# Patient Record
Sex: Female | Born: 1940 | Race: White | Hispanic: No | Smoking: Former smoker
Health system: Southern US, Community
[De-identification: ages and names within clinical notes are randomized; demographics above are authoritative.]

## PROBLEM LIST (undated history)

## (undated) DIAGNOSIS — M1990 Unspecified osteoarthritis, unspecified site: Secondary | ICD-10-CM

## (undated) DIAGNOSIS — I2510 Atherosclerotic heart disease of native coronary artery without angina pectoris: Secondary | ICD-10-CM

## (undated) DIAGNOSIS — K279 Peptic ulcer, site unspecified, unspecified as acute or chronic, without hemorrhage or perforation: Secondary | ICD-10-CM

## (undated) DIAGNOSIS — C801 Malignant (primary) neoplasm, unspecified: Secondary | ICD-10-CM

## (undated) DIAGNOSIS — E785 Hyperlipidemia, unspecified: Secondary | ICD-10-CM

## (undated) DIAGNOSIS — I252 Old myocardial infarction: Secondary | ICD-10-CM

## (undated) DIAGNOSIS — D649 Anemia, unspecified: Secondary | ICD-10-CM

## (undated) DIAGNOSIS — M810 Age-related osteoporosis without current pathological fracture: Secondary | ICD-10-CM

## (undated) DIAGNOSIS — N189 Chronic kidney disease, unspecified: Secondary | ICD-10-CM

## (undated) DIAGNOSIS — I4891 Unspecified atrial fibrillation: Secondary | ICD-10-CM

## (undated) DIAGNOSIS — I5042 Chronic combined systolic (congestive) and diastolic (congestive) heart failure: Secondary | ICD-10-CM

## (undated) DIAGNOSIS — J189 Pneumonia, unspecified organism: Secondary | ICD-10-CM

## (undated) DIAGNOSIS — J90 Pleural effusion, not elsewhere classified: Secondary | ICD-10-CM

## (undated) DIAGNOSIS — Z950 Presence of cardiac pacemaker: Secondary | ICD-10-CM

## (undated) DIAGNOSIS — I10 Essential (primary) hypertension: Secondary | ICD-10-CM

## (undated) HISTORY — DX: Age-related osteoporosis without current pathological fracture: M81.0

## (undated) HISTORY — PX: CATARACT EXTRACTION: SUR2

## (undated) HISTORY — DX: Pneumonia, unspecified organism: J18.9

## (undated) HISTORY — DX: Peptic ulcer, site unspecified, unspecified as acute or chronic, without hemorrhage or perforation: K27.9

## (undated) HISTORY — DX: Presence of cardiac pacemaker: Z95.0

## (undated) HISTORY — DX: Essential (primary) hypertension: I10

## (undated) HISTORY — DX: Old myocardial infarction: I25.2

## (undated) HISTORY — DX: Chronic kidney disease, unspecified: N18.9

## (undated) HISTORY — DX: Anemia, unspecified: D64.9

## (undated) HISTORY — DX: Pleural effusion, not elsewhere classified: J90

## (undated) HISTORY — DX: Unspecified atrial fibrillation: I48.91

## (undated) HISTORY — DX: Atherosclerotic heart disease of native coronary artery without angina pectoris: I25.10

## (undated) HISTORY — PX: COLONOSCOPY: SHX174

## (undated) HISTORY — DX: Hyperlipidemia, unspecified: E78.5

## (undated) HISTORY — DX: Chronic combined systolic (congestive) and diastolic (congestive) heart failure: I50.42

---

## 2007-06-28 HISTORY — PX: CORONARY ANGIOPLASTY: SHX604

## 2007-07-13 HISTORY — PX: PACEMAKER INSERTION: SHX728

## 2007-07-13 HISTORY — PX: OTHER SURGICAL HISTORY: SHX169

## 2010-02-23 ENCOUNTER — Other Ambulatory Visit: Admission: RE | Admit: 2010-02-23 | Discharge: 2010-02-23 | Payer: Self-pay | Admitting: Family Medicine

## 2010-02-23 ENCOUNTER — Encounter: Payer: Self-pay | Admitting: Gastroenterology

## 2010-02-23 ENCOUNTER — Ambulatory Visit: Payer: Self-pay | Admitting: Family Medicine

## 2010-02-23 DIAGNOSIS — E785 Hyperlipidemia, unspecified: Secondary | ICD-10-CM | POA: Insufficient documentation

## 2010-02-23 DIAGNOSIS — M810 Age-related osteoporosis without current pathological fracture: Secondary | ICD-10-CM

## 2010-02-23 DIAGNOSIS — I2510 Atherosclerotic heart disease of native coronary artery without angina pectoris: Secondary | ICD-10-CM | POA: Insufficient documentation

## 2010-02-23 DIAGNOSIS — I129 Hypertensive chronic kidney disease with stage 1 through stage 4 chronic kidney disease, or unspecified chronic kidney disease: Secondary | ICD-10-CM

## 2010-02-23 DIAGNOSIS — E1122 Type 2 diabetes mellitus with diabetic chronic kidney disease: Secondary | ICD-10-CM | POA: Insufficient documentation

## 2010-02-23 DIAGNOSIS — I10 Essential (primary) hypertension: Secondary | ICD-10-CM

## 2010-02-23 DIAGNOSIS — Z950 Presence of cardiac pacemaker: Secondary | ICD-10-CM

## 2010-02-23 DIAGNOSIS — N390 Urinary tract infection, site not specified: Secondary | ICD-10-CM

## 2010-02-23 DIAGNOSIS — N184 Chronic kidney disease, stage 4 (severe): Secondary | ICD-10-CM

## 2010-02-24 ENCOUNTER — Telehealth: Payer: Self-pay | Admitting: Family Medicine

## 2010-02-24 LAB — CONVERTED CEMR LAB
ALT: 21 units/L (ref 0–35)
Albumin: 4.1 g/dL (ref 3.5–5.2)
Alkaline Phosphatase: 56 units/L (ref 39–117)
Basophils Relative: 0.4 % (ref 0.0–3.0)
Bilirubin, Direct: 0.2 mg/dL (ref 0.0–0.3)
CO2: 28 meq/L (ref 19–32)
Chloride: 96 meq/L (ref 96–112)
Creatinine, Ser: 1 mg/dL (ref 0.4–1.2)
Eosinophils Relative: 2 % (ref 0.0–5.0)
Hemoglobin: 13.1 g/dL (ref 12.0–15.0)
LDL Cholesterol: 95 mg/dL (ref 0–99)
MCHC: 34.3 g/dL (ref 30.0–36.0)
MCV: 88.6 fL (ref 78.0–100.0)
Monocytes Absolute: 0.6 10*3/uL (ref 0.1–1.0)
Neutro Abs: 5.4 10*3/uL (ref 1.4–7.7)
Neutrophils Relative %: 70.5 % (ref 43.0–77.0)
Potassium: 4.3 meq/L (ref 3.5–5.1)
RBC: 4.31 M/uL (ref 3.87–5.11)
Sodium: 134 meq/L — ABNORMAL LOW (ref 135–145)
Total CHOL/HDL Ratio: 4
Total Protein: 7.5 g/dL (ref 6.0–8.3)
Triglycerides: 185 mg/dL — ABNORMAL HIGH (ref 0.0–149.0)
WBC: 7.6 10*3/uL (ref 4.5–10.5)

## 2010-03-05 ENCOUNTER — Telehealth (INDEPENDENT_AMBULATORY_CARE_PROVIDER_SITE_OTHER): Payer: Self-pay | Admitting: *Deleted

## 2010-03-05 ENCOUNTER — Ambulatory Visit: Payer: Self-pay | Admitting: Family Medicine

## 2010-03-05 LAB — CONVERTED CEMR LAB: POC INR: 1.8

## 2010-03-10 ENCOUNTER — Encounter: Admission: RE | Admit: 2010-03-10 | Discharge: 2010-03-10 | Payer: Self-pay | Admitting: Family Medicine

## 2010-03-19 ENCOUNTER — Ambulatory Visit: Payer: Self-pay | Admitting: Family Medicine

## 2010-03-19 LAB — CONVERTED CEMR LAB: POC INR: 3.2

## 2010-03-24 ENCOUNTER — Ambulatory Visit: Payer: Self-pay | Admitting: Internal Medicine

## 2010-03-24 DIAGNOSIS — I4891 Unspecified atrial fibrillation: Secondary | ICD-10-CM

## 2010-03-26 ENCOUNTER — Telehealth (INDEPENDENT_AMBULATORY_CARE_PROVIDER_SITE_OTHER): Payer: Self-pay | Admitting: *Deleted

## 2010-04-01 ENCOUNTER — Encounter: Payer: Self-pay | Admitting: Family Medicine

## 2010-04-09 ENCOUNTER — Encounter: Payer: Self-pay | Admitting: Internal Medicine

## 2010-04-09 ENCOUNTER — Encounter: Payer: Self-pay | Admitting: Family Medicine

## 2010-04-09 ENCOUNTER — Telehealth: Payer: Self-pay | Admitting: Internal Medicine

## 2010-04-09 ENCOUNTER — Telehealth: Payer: Self-pay | Admitting: Family Medicine

## 2010-04-15 ENCOUNTER — Ambulatory Visit: Payer: Self-pay | Admitting: Gastroenterology

## 2010-04-15 ENCOUNTER — Telehealth: Payer: Self-pay | Admitting: Family Medicine

## 2010-04-15 ENCOUNTER — Encounter: Payer: Self-pay | Admitting: Family Medicine

## 2010-04-15 ENCOUNTER — Encounter: Payer: Self-pay | Admitting: Internal Medicine

## 2010-04-20 ENCOUNTER — Ambulatory Visit: Payer: Self-pay | Admitting: Family Medicine

## 2010-04-23 ENCOUNTER — Telehealth: Payer: Self-pay | Admitting: Gastroenterology

## 2010-05-17 ENCOUNTER — Telehealth (INDEPENDENT_AMBULATORY_CARE_PROVIDER_SITE_OTHER): Payer: Self-pay | Admitting: *Deleted

## 2010-05-19 ENCOUNTER — Ambulatory Visit: Payer: Self-pay | Admitting: Family Medicine

## 2010-05-19 ENCOUNTER — Telehealth: Payer: Self-pay | Admitting: Gastroenterology

## 2010-05-19 LAB — CONVERTED CEMR LAB: INR: 4.1

## 2010-05-25 ENCOUNTER — Telehealth: Payer: Self-pay | Admitting: Gastroenterology

## 2010-05-26 ENCOUNTER — Ambulatory Visit: Payer: Self-pay | Admitting: Gastroenterology

## 2010-05-28 ENCOUNTER — Encounter: Payer: Self-pay | Admitting: Gastroenterology

## 2010-06-03 ENCOUNTER — Ambulatory Visit: Payer: Self-pay | Admitting: Family Medicine

## 2010-06-03 ENCOUNTER — Telehealth: Payer: Self-pay | Admitting: Family Medicine

## 2010-06-04 ENCOUNTER — Telehealth (INDEPENDENT_AMBULATORY_CARE_PROVIDER_SITE_OTHER): Payer: Self-pay | Admitting: *Deleted

## 2010-06-04 LAB — CONVERTED CEMR LAB
ALT: 22 units/L (ref 0–35)
Albumin: 3.9 g/dL (ref 3.5–5.2)
BUN: 24 mg/dL — ABNORMAL HIGH (ref 6–23)
CO2: 28 meq/L (ref 19–32)
Chloride: 95 meq/L — ABNORMAL LOW (ref 96–112)
Cholesterol: 171 mg/dL (ref 0–200)
Creatinine, Ser: 1 mg/dL (ref 0.4–1.2)
Glucose, Bld: 58 mg/dL — ABNORMAL LOW (ref 70–99)
Hgb A1c MFr Bld: 7.3 % — ABNORMAL HIGH (ref 4.6–6.5)
INR: 1.8 — ABNORMAL HIGH (ref 0.8–1.0)
Microalb, Ur: 6.1 mg/dL — ABNORMAL HIGH (ref 0.0–1.9)
Total Protein: 7.7 g/dL (ref 6.0–8.3)
Triglycerides: 203 mg/dL — ABNORMAL HIGH (ref 0.0–149.0)

## 2010-06-14 ENCOUNTER — Telehealth (INDEPENDENT_AMBULATORY_CARE_PROVIDER_SITE_OTHER): Payer: Self-pay | Admitting: *Deleted

## 2010-06-25 ENCOUNTER — Encounter: Payer: Self-pay | Admitting: Family Medicine

## 2010-07-14 ENCOUNTER — Encounter: Payer: Self-pay | Admitting: Family Medicine

## 2010-07-16 ENCOUNTER — Encounter (INDEPENDENT_AMBULATORY_CARE_PROVIDER_SITE_OTHER): Payer: Self-pay | Admitting: *Deleted

## 2010-07-27 NOTE — Progress Notes (Signed)
Summary: Pt took coumadin today  Phone Note Outgoing Call Call back at [facility] Phone [phone]   Call placed by: Aron Baba CMA Deborra Medina),  February 24, 2010 9:03 AM Call placed to: Patient Details for Reason: Pt took coumadin today Summary of Call: PT/ INR high----  hold coumadin Wednesday night and take 2.5 mg on Wed and Fridays and 5 mg all other days--- recheck next week  Spk with pt and notified her of the above, Pt already took her coumadin this morning with her breakfast. Is there a new direction/instruction for this pt, and she also wanted to know if she could take the fenofibrate at night with lipitor or during the day..Please Advise.         Aron Baba CMA Deborra Medina)  February 24, 2010 9:06 AM   Follow-up for Phone Call        she needs to take coumadin at night ok to take fenofibrate at night or in day either way hold coumadin tomorrow Follow-up by: [name] DO,  February 24, 2010 12:28 PM  Additional Follow-up for Phone Call Additional follow up Details #1::        Pt notified of the above, She voiced understanding. Additional Follow-up by: Aron Baba CMA Deborra Medina),  February 24, 2010 2:08 PM    New/Updated Medications: FENOFIBRATE 160 MG TABS (FENOFIBRATE) 1 by mouth once daily Prescriptions: FENOFIBRATE 160 MG TABS (FENOFIBRATE) 1 by mouth once daily  #30 x 2   Entered by:   Aron Baba CMA ([place])   Authorized by:   [name] DO   Signed by:   Aron Baba CMA (AAMA) on 02/24/2010   Method used:   Faxed to ...       [facility].* (retail)       [phone]       [place], [place]  [address]       Ph: [phone]       Fax: [phone]   RxID:   [id number]

## 2010-07-27 NOTE — Assessment & Plan Note (Signed)
Summary: 3 MONTH FOLLOWUP///SPH/ rescd cbs   Vital Signs:  Patient profile:   70 year old female Weight:      175.8 pounds Temp:     97.4 degrees F oral BP sitting:   120 / 70  (left arm) Cuff size:   large  Vitals Entered By: Aron Baba CMA Deborra Medina) (June 03, 2010 9:31 AM) CC: 3 mo f/u-- no problems or concerns.   History of Present Illness:  Type 1 diabetes mellitus follow-up      This is a 70 year old woman who presents with Type 2 diabetes mellitus follow-up.  The patient denies polyuria, polydipsia, blurred vision, self managed hypoglycemia, hypoglycemia requiring help, weight loss, weight gain, and numbness of extremities.  The patient denies the following symptoms: neuropathic pain, chest pain, vomiting, orthostatic symptoms, poor wound healing, intermittent claudication, vision loss, and foot ulcer.  Since the last visit the patient reports good dietary compliance, compliance with medications, exercising regularly, and monitoring blood glucose.  The patient has been measuring capillary blood glucose before breakfast.  Since the last visit, the patient reports having had eye care by an ophthalmologist and foot care by a podiatrist.    Hyperlipidemia follow-up      The patient also presents for Hyperlipidemia follow-up.  The patient denies muscle aches, GI upset, abdominal pain, flushing, itching, constipation, diarrhea, and fatigue.  The patient denies the following symptoms: chest pain/pressure, exercise intolerance, dypsnea, palpitations, syncope, and pedal edema.  Compliance with medications (by patient report) has been near 100%.  Dietary compliance has been good.  The patient reports exercising 3-4X per week.  Adjunctive measures currently used by the patient include ASA.    Hypertension follow-up      The patient also presents for Hypertension follow-up.  The patient denies lightheadedness, urinary frequency, headaches, edema, impotence, rash, and fatigue.  The patient  denies the following associated symptoms: chest pain, chest pressure, exercise intolerance, dyspnea, palpitations, syncope, leg edema, and pedal edema.  Compliance with medications (by patient report) has been near 100%.  The patient reports that dietary compliance has been good.  The patient reports exercising 3-4X per week.  Adjunctive measures currently used by the patient include salt restriction.    Current Medications (verified): 1)  Levemir 100 Unit/ml Soln (Insulin Detemir) .... Everyday At Bedtime 2)  Fish Oil 1000 Mg Caps (Omega-3 Fatty Acids) .... By Mouth Qd 3)  Calcium Carbonate 600 Mg Tabs (Calcium Carbonate) .Marland Kitchen.. 1 By Mouth Qd 4)  Vitamin D 400 Unit Caps (Cholecalciferol) .... Take 1 Tablet By Mouth Once A Day 5)  Sotalol Hcl 80 Mg Tabs (Sotalol Hcl) .Marland Kitchen.. 1 By Mouth Bid 6)  Lipitor 20 Mg Tabs (Atorvastatin Calcium) .Marland Kitchen.. 1 By Mouth At Bedtime 7)  Diovan Hct 320-25 Mg Tabs (Valsartan-Hydrochlorothiazide) .Marland Kitchen.. 1 By Mouth Qd 8)  Relion Pen Needles 31g X 8 Mm Misc (Insulin Pen Needle) .Marland Kitchen.. 1 As Needed 9)  Slow-Mag 71.5-119 Mg Tbec (Magnesium Cl-Calcium Carbonate) .Marland Kitchen.. 1 By Mouth Qd 10)  Glyburide-Metformin 5-500 Mg Tabs (Glyburide-Metformin) .... 1/2 Tablet By Mouth Qd 11)  Aspirin 81 Mg Tbec (Aspirin) .Marland Kitchen.. 1 By Mouth Qd 12)  Tekturna 300 Mg Tabs (Aliskiren Fumarate) .... Take One Tablet Daily 13)  Warfarin Sodium 5 Mg Tabs (Warfarin Sodium) .... Uad 14)  Amlodipine Besylate 10 Mg Tabs (Amlodipine Besylate) .Marland Kitchen.. 1 By Mouth Once Daily  Allergies (verified): No Known Drug Allergies  Past History:  Past Medical History: Last updated: 04/15/2010 Diabetes mellitus, type  II Hyperlipidemia Hypertension recurrent uti Coronary artery disease pacemaker --medtronic--07/13/2007 Atrial Fibrillation  Past Surgical History: Last updated: 02/23/2010 Stent Implant 07/13/2007 Pacemaker 07/13/2007 Cataract Surgery 01/12/09 and 01/26/09 both eyes  Family History: Last updated:  04/15/2010 Family History of Arthritis Family History Hypertension Family History of Breast Cancer: Mat Aunt, Sister Family History of Heart Disease: Father, deceased MI  Social History: Last updated: 04/15/2010 Retired--[employer] Former Smoker Alcohol use-no Drug use-no Regular exercise-no  Risk Factors: Alcohol Use: 0 (02/23/2010) Exercise: no (02/23/2010)  Risk Factors: Smoking Status: quit (02/23/2010)  Family History: Reviewed history from 04/15/2010 and no changes required. Family History of Arthritis Family History Hypertension Family History of Breast Cancer: Mat Aunt, Sister Family History of Heart Disease: Father, deceased MI  Social History: Reviewed history from 04/15/2010 and no changes required. Retired--[employer] Former Smoker Alcohol use-no Drug use-no Regular exercise-no  Review of Systems      See HPI  Physical Exam  General:  Well-developed,well-nourished,in no acute distress; alert,appropriate and cooperative throughout examination Nose:  no external deformity.   Lungs:  Normal respiratory effort, chest expands symmetrically. Lungs are clear to auscultation, no crackles or wheezes. Heart:  normal rate and no murmur.   Extremities:  No clubbing, cyanosis, edema, or deformity noted   Psych:  Oriented X3 and normally interactive.    Diabetes Management Exam:    Foot Exam (with socks and/or shoes not present):       Sensory-Pinprick/Light touch:          Left medial foot (L-4): normal          Left dorsal foot (L-5): normal          Left lateral foot (S-1): normal          Right medial foot (L-4): normal          Right dorsal foot (L-5): normal          Right lateral foot (S-1): normal       Sensory-Monofilament:          Left foot: normal          Right foot: normal       Inspection:          Left foot: normal          Right foot: normal       Nails:          Left foot: normal          Right foot:  normal    Foot Exam by Podiatrist:       Date: 04/01/2010       Results: early diabetic findings       Done by: Dr Serena Colonel Exam:       Eye Exam done elsewhere          Date: 03/11/2009          Results: normal          Done by: Marijo File   Impression & Recommendations:  Problem # 1:  ATRIAL FIBRILLATION (ICD-427.31)  Her updated medication list for this problem includes:    Sotalol Hcl 80 Mg Tabs (Sotalol hcl) .Marland Kitchen... 1 by mouth bid    Aspirin 81 Mg Tbec (Aspirin) .Marland Kitchen... 1 by mouth qd    Warfarin Sodium 5 Mg Tabs (Warfarin sodium) ..... Uad    Amlodipine Besylate 10 Mg Tabs (Amlodipine besylate) .Marland Kitchen... 1 by mouth once daily  Orders: Venipuncture (  36415) TLB-Lipid Panel (80061-LIPID) TLB-BMP (Basic Metabolic Panel-BMET) (99991111) TLB-Hepatic/Liver Function Pnl (80076-HEPATIC) TLB-A1C / Hgb A1C (Glycohemoglobin) (83036-A1C) TLB-Microalbumin/Creat Ratio, Urine (82043-MALB) TLB-PT (Protime) (85610-PTP) Specimen Handling (99000)  Reviewed the following: PT: 43.4 (02/23/2010)   INR: 4.1 (05/19/2010) Next Protime: 4 weeks (dated on 04/20/2010)  Problem # 2:  CORONARY ARTERY DISEASE (ICD-414.00)  Her updated medication list for this problem includes:    Sotalol Hcl 80 Mg Tabs (Sotalol hcl) .Marland Kitchen... 1 by mouth bid    Diovan Hct 320-25 Mg Tabs (Valsartan-hydrochlorothiazide) .Marland Kitchen... 1 by mouth qd    Aspirin 81 Mg Tbec (Aspirin) .Marland Kitchen... 1 by mouth qd    Tekturna 300 Mg Tabs (Aliskiren fumarate) .Marland Kitchen... Take one tablet daily    Amlodipine Besylate 10 Mg Tabs (Amlodipine besylate) .Marland Kitchen... 1 by mouth once daily  Orders: Venipuncture [id number]) TLB-Lipid Panel (80061-LIPID) TLB-BMP (Basic Metabolic Panel-BMET) (99991111) TLB-Hepatic/Liver Function Pnl (80076-HEPATIC) TLB-A1C / Hgb A1C (Glycohemoglobin) (83036-A1C) TLB-Microalbumin/Creat Ratio, Urine (82043-MALB)  Her updated medication list for this problem includes:    Sotalol Hcl 80 Mg Tabs (Sotalol hcl) .Marland Kitchen... 1 by mouth bid     Diovan Hct 320-25 Mg Tabs (Valsartan-hydrochlorothiazide) .Marland Kitchen... 1 by mouth qd    Aspirin 81 Mg Tbec (Aspirin) .Marland Kitchen... 1 by mouth qd    Tekturna 300 Mg Tabs (Aliskiren fumarate) .Marland Kitchen... Take one tablet daily    Amlodipine Besylate 10 Mg Tabs (Amlodipine besylate) .Marland Kitchen... 1 by mouth once daily  Labs Reviewed: Chol: 177 (02/23/2010)   HDL: 44.80 (02/23/2010)   LDL: 95 (02/23/2010)   TG: 185.0 (02/23/2010)  Problem # 3:  HYPERLIPIDEMIA (ICD-272.4)  Her updated medication list for this problem includes:    Lipitor 20 Mg Tabs (Atorvastatin calcium) .Marland Kitchen... 1 by mouth at bedtime  Orders: Venipuncture [id number]) TLB-Lipid Panel (80061-LIPID) TLB-BMP (Basic Metabolic Panel-BMET) (99991111) TLB-Hepatic/Liver Function Pnl (80076-HEPATIC) TLB-A1C / Hgb A1C (Glycohemoglobin) (83036-A1C) TLB-Microalbumin/Creat Ratio, Urine (82043-MALB)  Labs Reviewed: SGOT: 24 (02/23/2010)   SGPT: 21 (02/23/2010)   HDL:44.80 (02/23/2010)  LDL:95 (02/23/2010)  Chol:177 (02/23/2010)  Trig:185.0 (02/23/2010)  Problem # 4:  CARDIAC PACEMAKER IN SITU (ICD-V45.01)  Orders: Venipuncture [id number]) TLB-Lipid Panel (80061-LIPID) TLB-BMP (Basic Metabolic Panel-BMET) (99991111) TLB-Hepatic/Liver Function Pnl (80076-HEPATIC) TLB-A1C / Hgb A1C (Glycohemoglobin) (83036-A1C) TLB-Microalbumin/Creat Ratio, Urine (82043-MALB)  Problem # 5:  DIABETES MELLITUS, TYPE II (ICD-250.00)  Her updated medication list for this problem includes:    Levemir 100 Unit/ml Soln (Insulin detemir) ..... Everyday at bedtime    Diovan Hct 320-25 Mg Tabs (Valsartan-hydrochlorothiazide) .Marland Kitchen... 1 by mouth qd    Glyburide-metformin 5-500 Mg Tabs (Glyburide-metformin) .Marland Kitchen... 1/2 tablet by mouth qd    Aspirin 81 Mg Tbec (Aspirin) .Marland Kitchen... 1 by mouth qd  Orders: Venipuncture [id number]) TLB-Lipid Panel (80061-LIPID) TLB-BMP (Basic Metabolic Panel-BMET) (99991111) TLB-Hepatic/Liver Function Pnl (80076-HEPATIC) TLB-A1C / Hgb A1C (Glycohemoglobin)  (83036-A1C) TLB-Microalbumin/Creat Ratio, Urine (82043-MALB) Ophthalmology Referral (Ophthalmology)  Labs Reviewed: Creat: 1.0 (02/23/2010)     Last Eye Exam: normal (03/11/2009) Reviewed HgBA1c results: 6.9 (02/23/2010)  Complete Medication List: 1)  Levemir 100 Unit/ml Soln (Insulin detemir) .... Everyday at bedtime 2)  Fish Oil 1000 Mg Caps (Omega-3 fatty acids) .... By mouth qd 3)  Calcium Carbonate 600 Mg Tabs (Calcium carbonate) .Marland Kitchen.. 1 by mouth qd 4)  Vitamin D 400 Unit Caps (Cholecalciferol) .... Take 1 tablet by mouth once a day 5)  Sotalol Hcl 80 Mg Tabs (Sotalol hcl) .Marland Kitchen.. 1 by mouth bid 6)  Lipitor 20 Mg Tabs (Atorvastatin calcium) .Marland Kitchen.. 1 by mouth at bedtime 7)  Diovan Hct  320-25 Mg Tabs (Valsartan-hydrochlorothiazide) .Marland Kitchen.. 1 by mouth qd 8)  Relion Pen Needles 31g X 8 Mm Misc (Insulin pen needle) .Marland Kitchen.. 1 as needed 9)  Slow-mag 71.5-119 Mg Tbec (Magnesium cl-calcium carbonate) .Marland Kitchen.. 1 by mouth qd 10)  Glyburide-metformin 5-500 Mg Tabs (Glyburide-metformin) .... 1/2 tablet by mouth qd 11)  Aspirin 81 Mg Tbec (Aspirin) .Marland Kitchen.. 1 by mouth qd 12)  Tekturna 300 Mg Tabs (Aliskiren fumarate) .... Take one tablet daily 13)  Warfarin Sodium 5 Mg Tabs (Warfarin sodium) .... Uad 14)  Amlodipine Besylate 10 Mg Tabs (Amlodipine besylate) .Marland Kitchen.. 1 by mouth once daily  Patient Instructions: 1)  Please schedule a follow-up appointment in 6 months .    Orders Added: 1)  Venipuncture [id number] 2)  TLB-Lipid Panel [80061-LIPID] 3)  TLB-BMP (Basic Metabolic Panel-BMET) [id number] 4)  TLB-Hepatic/Liver Function Pnl [80076-HEPATIC] 5)  TLB-A1C / Hgb A1C (Glycohemoglobin) [83036-A1C] 6)  TLB-Microalbumin/Creat Ratio, Urine [82043-MALB] 7)  TLB-PT (Protime) [85610-PTP] 8)  Ophthalmology Referral [Ophthalmology] 9)  Specimen Handling [99000] 10)  Est. Patient Level III [id number]    Flu Vaccine Result Date:  04/07/2010 Flu Vaccine Result:  given Flu Vaccine Next Due:  1 yr Pneumovax Result  Date:  04/07/2010 Pneumovax Result:  given

## 2010-07-27 NOTE — Progress Notes (Signed)
Summary: Refill Request  Phone Note Refill Request Call back at [phone] Message from:  Pharmacy on April 15, 2010 9:52 AM  Refills Requested: Medication #1:  WARFARIN SODIUM 5 MG TABS UAD   Dosage confirmed as above?Dosage Confirmed   Supply Requested: 1 month   Last Refilled: 03/18/2010 [facility] on [address]  Next Appointment Scheduled: 12.2.11 Initial call taken by: Elna Breslow,  April 15, 2010 9:52 AM  Follow-up for Phone Call        make sure pt has PT scheduled --due this week Follow-up by: [name] DO,  April 15, 2010 3:54 PM  Additional Follow-up for Phone Call Additional follow up Details #1::        adv pt she will need her pt checked, she stated no one notified her, adv f/u is due now, says she will call daughter and and see what day she can bring her and she will call back to schedule. Rx faxed.... [place] Deborra Medina)  April 15, 2010 4:39 PM     Prescriptions: WARFARIN SODIUM 5 MG TABS (WARFARIN SODIUM) UAD  #30 x 0   Entered by:   Aron Baba CMA ([place])   Authorized by:   [name] DO   Signed by:   Aron Baba CMA ([place]) on 04/15/2010   Method used:   Electronically to        [facility].* (retail)       [phone]       [place], [place]  [address]       Ph: [phone]       Fax: [phone]   RxID:   [id number]

## 2010-07-27 NOTE — Progress Notes (Signed)
Summary: called back with ophthamologist name and facilty  Phone Note Call from Patient Call back at Home Phone [phone]   Caller: Patient Summary of Call: patient called back (her daughter was with her) ----patient confirmed with daughter who said that Ophthamologist' name is Dr Joya San and this doctor is located at [facility] in [place] will be at home number [phone] for rest of the day Initial call taken by: Berneta Sages,  June 03, 2010 12:25 PM  Follow-up for Phone Call        Appt Scheduled Follow-up by: Phylliss Bob [facility],  June 03, 2010 1:57 PM

## 2010-07-27 NOTE — Assessment & Plan Note (Signed)
Summary: pt/cbs  Nurse Visit   Vital Signs:  Patient profile:   70 year old female Height:      62.5 inches Weight:      177 pounds Pulse rate:   68 / minute BP sitting:   118 / 62  (left arm)  Vitals Entered By: Rolla Flatten CMA (May 19, 2010 9:54 AM)  CC: PT/INR   Current Medications (verified): 1)  Levemir 100 Unit/ml Soln (Insulin Detemir) .... Everyday At Bedtime 2)  Fish Oil 1000 Mg Caps (Omega-3 Fatty Acids) .... By Mouth Qd 3)  Calcium Carbonate 600 Mg Tabs (Calcium Carbonate) .Marland Kitchen.. 1 By Mouth Qd 4)  Vitamin D 400 Unit Caps (Cholecalciferol) .... Take 1 Tablet By Mouth Once A Day 5)  Sotalol Hcl 80 Mg Tabs (Sotalol Hcl) .Marland Kitchen.. 1 By Mouth Bid 6)  Lipitor 20 Mg Tabs (Atorvastatin Calcium) .Marland Kitchen.. 1 By Mouth At Bedtime 7)  Diovan Hct 320-25 Mg Tabs (Valsartan-Hydrochlorothiazide) .Marland Kitchen.. 1 By Mouth Qd 8)  Relion Pen Needles 31g X 8 Mm Misc (Insulin Pen Needle) .Marland Kitchen.. 1 As Needed 9)  Slow-Mag 71.5-119 Mg Tbec (Magnesium Cl-Calcium Carbonate) .Marland Kitchen.. 1 By Mouth Qd 10)  Glyburide-Metformin 5-500 Mg Tabs (Glyburide-Metformin) .... 1/2 Tablet By Mouth Qd 11)  Aspirin 81 Mg Tbec (Aspirin) .Marland Kitchen.. 1 By Mouth Qd 12)  Tekturna 300 Mg Tabs (Aliskiren Fumarate) .... Take One Tablet Daily 13)  Warfarin Sodium 5 Mg Tabs (Warfarin Sodium) .... Uad 14)  Amlodipine Besylate 10 Mg Tabs (Amlodipine Besylate) .Marland Kitchen.. 1 By Mouth Once Daily  Allergies (verified): No Known Drug Allergies Laboratory Results   Blood Tests    Date/Time Reported: May 19, 2010 9:55 AM   INR: 4.1   (Normal Range: 0.88-1.12   Therap INR: 2.0-3.5)    Orders Added: 1)  Est. Patient Level I [id number] 2)  Protime [id number]    ANTICOAGULATION RECORD PREVIOUS REGIMEN & LAB RESULTS   Previous INR:  3.0 on  04/20/2010 Previous Coumadin Dose(mg):  (5 mg) 1 tab qd except  for (2.5mg ) 1/2 tab W,F on  04/20/2010 Previous Regimen:  Same on  04/20/2010  NEW REGIMEN & LAB RESULTS Current INR: 4.1 Current Coumadin  Dose(mg): 1 TAB DAILY EXCEPT 1/2 TAB M.F Regimen: hold   Provider: LOWNE  Anticoagulation Visit Questionnaire Coumadin dose missed/changed:  No Abnormal Bleeding Symptoms:  No  Any diet changes including alcohol intake, vegetables or greens since the last visit:  No Any illnesses or hospitalizations since the last visit:  No Any signs of clotting since the last visit (including chest discomfort, dizziness, shortness of breath, arm tingling, slurred speech, swelling or redness in leg):  No  MEDICATIONS LEVEMIR 100 UNIT/ML SOLN (INSULIN DETEMIR) EVERYDAY AT BEDTIME FISH OIL 1000 MG CAPS (OMEGA-3 FATTY ACIDS) by mouth QD CALCIUM CARBONATE 600 MG TABS (CALCIUM CARBONATE) 1 by mouth QD VITAMIN D 400 UNIT CAPS (CHOLECALCIFEROL) Take 1 tablet by mouth once a day SOTALOL HCL 80 MG TABS (SOTALOL HCL) 1 by mouth BID LIPITOR 20 MG TABS (ATORVASTATIN CALCIUM) 1 by mouth at bedtime DIOVAN HCT 320-25 MG TABS (VALSARTAN-HYDROCHLOROTHIAZIDE) 1 by mouth qd RELION PEN NEEDLES 31G X 8 MM MISC (INSULIN PEN NEEDLE) 1 as needed SLOW-MAG 71.5-119 MG TBEC (MAGNESIUM CL-CALCIUM CARBONATE) 1 by mouth qd GLYBURIDE-METFORMIN 5-500 MG TABS (GLYBURIDE-METFORMIN) 1/2 tablet by mouth qd ASPIRIN 81 MG TBEC (ASPIRIN) 1 by mouth qd TEKTURNA 300 MG TABS (ALISKIREN FUMARATE) take one tablet daily WARFARIN SODIUM 5 MG TABS (WARFARIN SODIUM) UAD AMLODIPINE BESYLATE 10 MG TABS (  AMLODIPINE BESYLATE) 1 by mouth once daily

## 2010-07-27 NOTE — Letter (Signed)
Summary: Generic Letter  Press photographer, [facility]  [address] [place]   [place], [place] [address]   Phone: [phone]  Fax: [phone]    04/09/2010  Yolanda Huffman [address] [place], [place]  [address]  To Whom It May Concern,     The above named patient is under my care and is physically able to get on and off [employer] bus.  If I can be of any further assistance please call the office at [phone].    Sincerely,   Dr. Cristopher Peru

## 2010-07-27 NOTE — Assessment & Plan Note (Signed)
Summary: SCREEN FOR COLON -ON WARFARIN & HAS A PACEMAKER/YF   History of Present Illness Visit Type: consult Primary GI MD: Erskine Emery MD [facility] Primary Provider: Etter Sjogren Requesting Provider: [name], DO Chief Complaint: discuss colonoscopy on coumadin History of Present Illness:   Yolanda Huffman is a pleasant 70 year old white female referred at the request of Dr. Etter Sjogren for screening colonoscopy.  Patient is on Coumadin for atrial fibrillation.  She is a type II diabetic and has coronary artery disease.  Except for mild abdominal bloating she has no GI complaints.  Specifically, she is without change in bowel habits, abdominal pain, melena or hematochezia.   GI Review of Systems    Reports bloating.      Denies abdominal pain, acid reflux, belching, chest pain, dysphagia with liquids, dysphagia with solids, heartburn, loss of appetite, nausea, vomiting, vomiting blood, weight loss, and  weight gain.        Denies anal fissure, black tarry stools, change in bowel habit, constipation, diarrhea, diverticulosis, fecal incontinence, heme positive stool, hemorrhoids, irritable bowel syndrome, jaundice, light color stool, liver problems, rectal bleeding, and  rectal pain.    Current Medications (verified): 1)  Levemir 100 Unit/ml Soln (Insulin Detemir) .... Everyday At Bedtime 2)  Fish Oil 1000 Mg Caps (Omega-3 Fatty Acids) .... By Mouth Qd 3)  Calcium Carbonate 600 Mg Tabs (Calcium Carbonate) .Marland Kitchen.. 1 By Mouth Qd 4)  Vitamin D 400 Unit Caps (Cholecalciferol) .... Take 1 Tablet By Mouth Once A Day 5)  Sotalol Hcl 80 Mg Tabs (Sotalol Hcl) .Marland Kitchen.. 1 By Mouth Bid 6)  Lipitor 20 Mg Tabs (Atorvastatin Calcium) .Marland Kitchen.. 1 By Mouth At Bedtime 7)  Diovan Hct 320-25 Mg Tabs (Valsartan-Hydrochlorothiazide) .Marland Kitchen.. 1 By Mouth Qd 8)  Relion Pen Needles 31g X 8 Mm Misc (Insulin Pen Needle) .Marland Kitchen.. 1 As Needed 9)  Slow-Mag 71.5-119 Mg Tbec (Magnesium Cl-Calcium Carbonate) .Marland Kitchen.. 1 By Mouth Qd 10)  Glyburide-Metformin  5-500 Mg Tabs (Glyburide-Metformin) .... 1/2 Tablet By Mouth Qd 11)  Aspirin 81 Mg Tbec (Aspirin) .Marland Kitchen.. 1 By Mouth Qd 12)  Tekturna 300 Mg Tabs (Aliskiren Fumarate) .... Take One Tablet Daily 13)  Warfarin Sodium 5 Mg Tabs (Warfarin Sodium) .... Uad 14)  Amlodipine Besylate 10 Mg Tabs (Amlodipine Besylate) .Marland Kitchen.. 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  The patient denies allergy/sinus, anemia, anxiety-new, back pain, blood in urine, breast changes/lumps, confusion, cough, coughing up blood, depression-new, fainting, fatigue, fever, headaches-new, hearing problems, heart murmur, heart rhythm changes, itching, menstrual pain, muscle pains/cramps, night sweats, nosebleeds, pregnancy symptoms, shortness of breath, skin rash, sleeping problems, sore throat, swelling of feet/legs, swollen lymph glands, thirst - excessive, urination - excessive, urination changes/pain, urine leakage, vision changes, and voice change.         All other systems were reviewed and were negative   Vital Signs:  Patient profile:  70 year old female Height:      62.5 inches Weight:      176 pounds BMI:     31.79 Pulse rate:   72 / minute Pulse rhythm:    regular BP sitting:   114 / 60  (left arm) Cuff size:   regular  Vitals Entered By: Abelino Derrick CMA Deborra Medina) (April 15, 2010 9:57 AM)  Physical Exam  Additional Exam:  On physical exam she is a well-developed well-nourished female  skin: anicteric HEENT: normocephalic; PEERLA; no nasal or pharyngeal abnormalities neck: supple nodes: no cervical lymphadenopathy chest: clear to ausculatation and percussion heart: no murmurs, gallops, or rubs abd: soft, nontender; BS normoactive; no abdominal masses, tenderness, organomegaly; a small umbilical hernia is present rectal: deferred ext: no cynanosis, clubbing, edema skeletal: no deformities neuro: oriented x 3; no focal abnormalities    Impression & Recommendations:  Problem # 1:  SPECIAL SCREENING FOR MALIGNANT NEOPLASMS COLON (ICD-V76.51)  Plan colonoscopy.  Coumadin will be held in advance of the procedure.  Orders: Colonoscopy (Colon)  Problem # 2:  ATRIAL FIBRILLATION (ICD-427.31) Assessment: Comment Only  Problem # 3:  HYPERLIPIDEMIA (B2193296.4) Assessment: Comment Only  Patient Instructions: 1)  Copy sent to : [name], DO 2)  Your colonoscopy is schdeuled for 05/26/2010 at 10am 3)  Colonoscopy and Flexible Sigmoidoscopy brochure given.  4)  Conscious Sedation brochure given.  5)  You will hold your Coumadin 5 days before your procedure pending Dr Crissie Sickles 6)  The medication list was reviewed and reconciled.  All changed / newly prescribed medications were explained.  A complete medication list was provided to the patient / caregiver. Prescriptions: MOVIPREP 100 GM  SOLR (PEG-KCL-NACL-NASULF-NA ASC-C) As per prep instructions.  #1 x 0   Entered by:   Genella Mech CMA ([place])   Authorized by:   Inda Castle MD   Signed by:   Genella Mech CMA ([place]) on 04/15/2010   Method used:   Electronically to        [facility] (retail)       [phone]       [place], [place]  [address]       Ph: [phone]       Fax: [phone]   RxID:   [id number]

## 2010-07-27 NOTE — Progress Notes (Signed)
Summary: REFILL REQUEST  Phone Note Refill Request Call back at [phone] Message from:  Pharmacy on February 24, 2010 1:02 PM  Refills Requested: Medication #1:  FENOFIBRATE 160 MG TABS 1 by mouth once daily.   Dosage confirmed as above?Dosage Confirmed   Supply Requested: 1 month   Notes: This increases the effect of warfarin. Do you still want [place] to dispense? [place] pt seen on 02/23/10  Initial call taken by: Osborn Coho,  February 24, 2010 1:03 PM  Follow-up for Phone Call        her level is high right now---we will hold off until Inr comes down.   Pt really needs to work on diet ---we can refer to diabetic nutrition counseling it pt agrees. Follow-up by: [name] DO,  February 24, 2010 1:32 PM  Additional Follow-up for Phone Call Additional follow up Details #1::        [place] will discuss other options(nutrition) at pending [place] notified............Marland KitchenFelecia Deloach CMA  February 24, 2010 5:08 PM

## 2010-07-27 NOTE — Progress Notes (Signed)
Summary: Direction on Coumadin  Phone Note Outgoing Call   Call placed by: Aron Baba CMA Deborra Medina),  March 05, 2010 12:12 PM Call placed to: Patient Details for Reason: Direction for Coumadin Summary of Call: called to pt to advise direction on Coumadin. Pt is to take 2.5 on Monday only and 5mg  all the other days recheck PT in 2 weeks per Dr.Lowne. Aron Baba CMA Deborra Medina)  March 05, 2010 12:13 PM  Left message to call back  [place])  March 05, 2010 3:10 PM   Follow-up for Phone Call        pt aware.... appt scheduled Follow-up by: Aron Baba CMA Deborra Medina),  March 05, 2010 5:13 PM

## 2010-07-27 NOTE — Assessment & Plan Note (Signed)
Summary: nep/ cad, cardiac pacmaker in situ / pt has medicare, bcbs. gd   Visit Type:  Follow-up Primary Provider:  Etter Sjogren   History of Present Illness: Yolanda Huffman is referred today by Dr. Etter Sjogren for ongoing evaluation of atrial fibrillation in the setting of CAD and bradycardia s/p PPM.  Yolanda Huffman is a long time resident of [place] and has moved to [place] to be closer to her daughter.  Yolanda Huffman has remained active and denies c/p, sob, and has had minimal palpitations.  Problems Prior to Update: 1)  Osteoporosis  (ICD-733.00) 2)  Preventive Health Care  (ICD-V70.0) 3)  Coronary Artery Disease  (ICD-414.00) 4)  Uti's, Recurrent  (ICD-599.0) 5)  Hyperlipidemia  (ICD-272.4) 6)  Cardiac Pacemaker in Situ  (ICD-V45.01) 7)  Hypertension  (ICD-401.9) 8)  Diabetes Mellitus, Type II  (ICD-250.00)  Current Medications (verified): 1)  Levemir 100 Unit/ml Soln (Insulin Detemir) .... Everyday At Bedtime 2)  Fish Oil 1000 Mg Caps (Omega-3 Fatty Acids) .... By Mouth Qd 3)  Calcium Carbonate 600 Mg Tabs (Calcium Carbonate) .Marland Kitchen.. 1 By Mouth Qd 4)  Vitamin D3 400iu .... By Mouth Qd 5)  Sotalol Hcl 80 Mg Tabs (Sotalol Hcl) .Marland Kitchen.. 1 By Mouth Bid 6)  Lipitor 20 Mg Tabs (Atorvastatin Calcium) .Marland Kitchen.. 1 By Mouth At Bedtime 7)  Diovan Hct 320-25 Mg Tabs (Valsartan-Hydrochlorothiazide) .Marland Kitchen.. 1 By Mouth Qd 8)  Relion Pen Needles 31g X 8 Mm Misc (Insulin Pen Needle) .Marland Kitchen.. 1 As Needed 9)  Slow-Mag 71.5-119 Mg Tbec (Magnesium Cl-Calcium Carbonate) .Marland Kitchen.. 1 By Mouth Qd 10)  Glyburide-Metformin 5-500 Mg Tabs (Glyburide-Metformin) .... 1/2 Tablet By Mouth Qd 11)  Aspirin 81 Mg Tbec (Aspirin) .Marland Kitchen.. 1 By Mouth Qd 12)  Tekturna 300 Mg .Marland Kitchen.. 1i By Mouth Once Daily 13)  Warfarin Sodium 5 Mg Tabs (Warfarin Sodium) .... Uad 14)  Amlodipine Besylate 10 Mg Tabs (Amlodipine Besylate) .Marland Kitchen.. 1 By Mouth Once Daily 15)  Zostavax 19400 Unt/0.57ml Solr (Zoster Vaccine Live) .Marland Kitchen.. 1 Ml Im X1 16)  Vitamin D3 1000 Unit Tabs (Cholecalciferol) .Marland Kitchen.. 1 By  Mouth Once Daily  Allergies (verified): No Known Drug Allergies  Past History:  Past Medical History: Last updated: 02/23/2010 Diabetes mellitus, type II Hyperlipidemia Hypertension recurrent uti Coronary artery disease pacemaker --medtronic--07/13/2007  Past Surgical History: Last updated: 02/23/2010 Stent Implant 07/13/2007 Pacemaker 07/13/2007 Cataract Surgery 01/12/09 and 01/26/09 both eyes  Family History: Last updated: 02/23/2010 Family History of Arthritis Family History Hypertension  Social History: Last updated: 02/23/2010 Retired--[employer] Widow/Widower Former Smoker Alcohol use-no Drug use-no Regular exercise-no  Review of Systems       All systems reviewed and negative except as noted in the HPI.  Vital Signs:  Patient profile:   70 year old female Height:      62.50 inches Weight:      174 pounds BMI:     31.43 Pulse rate:   70 / minute BP sitting:   142 / 78  (left arm)  Vitals Entered By: Margaretmary Bayley CMA (March 24, 2010 12:10 PM)  Physical Exam  General:  Well-developed,well-nourished,in no acute distress; alert,appropriate and cooperative throughout examination Head:  Normocephalic and atraumatic without obvious abnormalities. No apparent alopecia or balding. Eyes:  pupils equal, pupils round, pupils reactive to light, and no injection.   Mouth:  Oral mucosa and oropharynx without lesions or exudates.  Teeth in good repair. Neck:  No deformities, masses, or tenderness noted. Chest Wall:  Well healed PPM incision. Lungs:  Normal respiratory  effort, chest expands symmetrically. Lungs are clear to auscultation, no crackles or wheezes. Heart:  RRR with normal S1 and S2.  PMI is not enlarged or laterally displaced. Abdomen:  Bowel sounds positive,abdomen soft and non-tender without masses, organomegaly or hernias noted. Msk:  Back normal, normal gait. Muscle strength and tone normal. Pulses:  pulses normal in all 4  extremities Extremities:  No clubbing or cyanosis. Neurologic:  Alert and oriented x 3.    PPM Follow Up Battery Voltage:  2.79 V     Battery Est. Longevity:  10 yrs       PPM Device Measurements Atrium  Amplitude: 5.60 mV, Impedance: 389 ohms, Threshold: 0.50 V at 0.40 msec Right Ventricle  Amplitude: 5.60 mV, Impedance: 485 ohms, Threshold: 0.750 V at 0.40 msec  Episodes MS Episodes:  7     Percent Mode Switch:  8.2%     Coumadin:  Yes Ventricular High Rate:  0     Atrial Pacing:  84.9%     Ventricular Pacing:  0.5%  Parameters Mode:  MVP     Lower Rate Limit:  70     Upper Rate Limit:  120 Paced AV Delay:  180     Sensed AV Delay:  150 Next Cardiology Appt Due:  08/30/2010 Tech Comments:  10 MODE SWITCHES + COUMADIN.  NORMAL DEVICE FUNCTION.  CHANGED RA OUTPUT FROM 1.5 TO 2.00 AND RV OUTPUT FROM 2.250 TO 2.500 V.  PT PREFERS OV [facility]ATHER THAN [facility].  ROV IN 6 MTHS W/DEVICE CLINIC. Shelly Bombard MD Comments:  Agree with above.  Impression & Recommendations:  Problem # 1:  CARDIAC PACEMAKER IN SITU (ICD-V45.01) Her device is working normally.  Will recheck in several months.  Problem # 2:  CORONARY ARTERY DISEASE (ICD-414.00) Yolanda Huffman is stable s/p stenting.  No anginal symptoms. Her updated medication list for this problem includes:    Sotalol Hcl 80 Mg Tabs (Sotalol hcl) .Marland Kitchen... 1 by mouth bid    Aspirin 81 Mg Tbec (Aspirin) .Marland Kitchen... 1 by mouth qd    Warfarin Sodium 5 Mg Tabs (Warfarin sodium) ..... Uad    Amlodipine Besylate 10 Mg Tabs (Amlodipine besylate) .Marland Kitchen... 1 by mouth once daily  Problem # 3:  HYPERTENSION (ICD-401.9) Her blood pressure is minimally elevated.  Continue meds as below and maintain a low sodium diet. Her updated medication list for this problem includes:    Sotalol Hcl 80 Mg Tabs (Sotalol hcl) .Marland Kitchen... 1 by mouth bid    Diovan Hct 320-25 Mg Tabs (Valsartan-hydrochlorothiazide) .Marland Kitchen... 1 by mouth qd    Aspirin 81 Mg Tbec (Aspirin) .Marland Kitchen... 1 by mouth qd     Amlodipine Besylate 10 Mg Tabs (Amlodipine besylate) .Marland Kitchen... 1 by mouth once daily  Problem # 4:  ATRIAL FIBRILLATION (ICD-427.31) Yolanda Huffman is out of rhythm about 10% of the time.  Continue meds as below. Her updated medication list for this problem includes:    Sotalol Hcl 80 Mg Tabs (Sotalol hcl) .Marland Kitchen... 1 by mouth bid    Aspirin 81 Mg Tbec (Aspirin) .Marland Kitchen... 1 by mouth qd    Warfarin Sodium 5 Mg Tabs (Warfarin sodium) ..... Uad  Patient Instructions: 1)  Your physician wants you to follow-up in: 6 months with Dr Knox Saliva will receive a reminder letter in the mail two months in advance. If you don't receive a letter, please call our office to schedule the follow-up appointment.

## 2010-07-27 NOTE — Consult Note (Signed)
Summary: [facility]   Imported By: Edmonia James 04/08/2010 13:25:05  _____________________________________________________________________  External Attachment:    Type:   Image     Comment:   External Document

## 2010-07-27 NOTE — Progress Notes (Signed)
Summary: Letter Needed  Phone Note Call from Patient Call back at Home Phone [phone]   Caller: Patient Summary of Call: Patient called this morning stating that she has filled out an application to ride the [employer] bus. She sent in all the information but they now need a letter from her PCP stating that she is physically well enough to ride (get on and off) the bus. Letter needs to be faxed upon completion to [employer] (apartments) [phone]. Attn: Danielle Hoggard. This is the lady asissting Mrs. Yamin with her application and will be sending the letter to the [employer] people.  Initial call taken by: Elna Breslow,  April 09, 2010 11:31 AM  Follow-up for Phone Call        letter done Follow-up by: [name] DO,  April 09, 2010 4:10 PM  Additional Follow-up for Phone Call Additional follow up Details #1::        Faxed, patient notified. Ernestene Mention CMA  April 09, 2010 5:14 PM

## 2010-07-27 NOTE — Letter (Signed)
Summary: [facility]   [facility]   Imported By: Sallee Provencal 04/27/2010 13:30:21  _____________________________________________________________________  External Attachment:    Type:   Image     Comment:   External Document

## 2010-07-27 NOTE — Progress Notes (Signed)
Summary: refill warfarin  Phone Note Refill Request Message from:  Fax from Pharmacy on May 17, 2010 9:42 AM  Refills Requested: Medication #1:  WARFARIN SODIUM 5 MG TABS UAD [facility] [address] fax [phone] - note on fax pt requests new rx w/90 day supply  Initial call taken by: Arbie [place],  May 17, 2010 9:43 AM  Follow-up for Phone Call        are you ok w/ 90 day supply for patient .........Marland KitchenMalachi Bonds CMA  May 17, 2010 3:55 PM   Additional Follow-up for Phone Call Additional follow up Details #1::        Let pt know we normally don't do 3 months supply of coumadin but because she has been coming every month--- we will send #90 in with 0 refills.  Pt must con't to come every month.  She should have an appointment this week or next. Additional Follow-up by: [name] DO,  May 17, 2010 8:00 PM    Additional Follow-up for Phone Call Additional follow up Details #2::    spoke w/ patient aware prescription sent to pharmacy ........Marland KitchenMalachi Bonds CMA  May 18, 2010 2:06 PM   Prescriptions: WARFARIN SODIUM 5 MG TABS (WARFARIN SODIUM) UAD  #90 x 0   Entered by:   Malachi Bonds CMA   Authorized by:   [name] DO   Signed by:   Malachi Bonds CMA on 05/18/2010   Method used:   Electronically to        [facility] [phone]* (retail)       [address]       [place], [place]  [address]       Ph: [phone]       Fax: [phone]   RxID:   [id number]

## 2010-07-27 NOTE — Assessment & Plan Note (Signed)
Summary: NEW TO ESTAB//PH   Vital Signs:  Patient profile:   70 year old female Height:      62.50 inches Weight:      173 pounds BMI:     31.25 Temp:     98.2 degrees F oral Pulse rate:   68 / minute Pulse rhythm:   regular BP sitting:   118 / 64  (left arm)  Vitals Entered By: Aron Baba CMA Deborra Medina) (February 23, 2010 10:31 AM) CC: NEW EST CARE CPX/FASTING Is Patient Diabetic? Yes Did you bring your meter with you today? No   History of Present Illness: Pt here to establish and get labs.   Pt with no complaints.  Her for cpe.    Preventive Screening-Counseling & Management  Alcohol-Tobacco     Alcohol drinks/day: 0     Smoking Status: quit  Caffeine-Diet-Exercise     Does Patient Exercise: no  Safety-Violence-Falls     Smoke Detectors: yes     Violence in the Home: no risk noted     Sexual Abuse: no     Fall Risk: no      Drug Use:  no.    Current Medications (verified): 1)  Levemir 100 Unit/ml Soln (Insulin Detemir) .... Everyday At Bedtime 2)  Fish Oil 1000 Mg Caps (Omega-3 Fatty Acids) .... By Mouth Qd 3)  Calcium Carbonate 600 Mg Tabs (Calcium Carbonate) .Marland Kitchen.. 1 By Mouth Qd 4)  Vitamin D3 400iu .... By Mouth Qd 5)  Sotalol Hcl 80 Mg Tabs (Sotalol Hcl) .Marland Kitchen.. 1 By Mouth Bid 6)  Lipitor 20 Mg Tabs (Atorvastatin Calcium) .Marland Kitchen.. 1 By Mouth At Bedtime 7)  Diovan Hct 320-25 Mg Tabs (Valsartan-Hydrochlorothiazide) .Marland Kitchen.. 1 By Mouth Qd 8)  Relion Pen Needles 31g X 8 Mm Misc (Insulin Pen Needle) .Marland Kitchen.. 1 As Needed 9)  Slow-Mag 71.5-119 Mg Tbec (Magnesium Cl-Calcium Carbonate) .Marland Kitchen.. 1 By Mouth Qd 10)  Glyburide-Metformin 5-500 Mg Tabs (Glyburide-Metformin) .... 1/2 Tablet By Mouth Qd 11)  Aspirin 81 Mg Tbec (Aspirin) .Marland Kitchen.. 1 By Mouth Qd 12)  Tekturna 300 Mg .Marland Kitchen.. 1i By Mouth Once Daily 13)  Warfarin Sodium 5 Mg Tabs (Warfarin Sodium) .Marland Kitchen.. 1 By Mouth Once Daily 14)  Amlodipine Besylate 10 Mg Tabs (Amlodipine Besylate) .Marland Kitchen.. 1 By Mouth Once Daily 15)  Zostavax 19400 Unt/0.51ml  Solr (Zoster Vaccine Live) .Marland Kitchen.. 1 Ml Im X1  Allergies (verified): No Known Drug Allergies  Past History:  Family History: Last updated: 02/23/2010 Family History of Arthritis Family History Hypertension  Social History: Last updated: 02/23/2010 Retired--[place] [employer] Widow/Widower Former Smoker Alcohol use-no Drug use-no Regular exercise-no  Risk Factors: Alcohol Use: 0 (02/23/2010) Exercise: no (02/23/2010)  Risk Factors: Smoking Status: quit (02/23/2010)  Past Medical History: Diabetes mellitus, type II Hyperlipidemia Hypertension recurrent uti Coronary artery disease pacemaker --medtronic--07/13/2007  Past Surgical History: Stent Implant 07/13/2007 Pacemaker 07/13/2007 Cataract Surgery 01/12/09 and 01/26/09 both eyes  Family History: Reviewed history and no changes required. Family History of Arthritis Family History Hypertension  Social History: Reviewed history and no changes required. 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 No apparent alopecia or balding. Eyes:  pupils equal, pupils round,  pupils reactive to light, and no injection.   Ears:  External ear exam shows no significant lesions or deformities.  Otoscopic examination reveals clear canals, tympanic membranes are intact bilaterally without bulging, retraction, inflammation or discharge. Hearing is grossly normal bilaterally. Nose:  External nasal examination shows no deformity or inflammation. Nasal mucosa are pink and moist without lesions or exudates. Mouth:  Oral mucosa and oropharynx without lesions or  exudates.  Teeth in good repair. Neck:  No deformities, masses, or tenderness noted. Chest Wall:  No deformities, masses, or tenderness noted. Breasts:  No mass, nodules, thickening, tenderness, bulging, retraction, inflamation, nipple discharge or skin changes noted.   Lungs:  Normal respiratory effort, chest expands symmetrically. Lungs are clear to auscultation, no crackles or wheezes. Heart:  normal rate.   Abdomen:  Bowel sounds positive,abdomen soft and non-tender without masses, organomegaly or hernias noted. Genitalia:  Pelvic Exam:        External: normal female genitalia without lesions or masses        Vagina: normal without lesions or masses        Cervix: normal without lesions or masses        Adnexa: normal bimanual exam without masses or fullness        Uterus: normal by palpation        Pap smear: performed  Diabetes Management Exam:    Foot Exam (with socks and/or shoes not present):       Sensory-Pinprick/Light touch:          Left medial foot (L-4): normal          Left dorsal foot (L-5): normal          Left lateral foot (S-1): normal          Right medial foot (L-4): normal          Right dorsal foot (L-5): normal          Right lateral foot (S-1): normal       Sensory-Monofilament:          Left foot: normal          Right foot: normal       Inspection:          Left foot: normal          Right foot: normal       Nails:          Left foot: normal          Right foot: normal    Eye Exam:       Eye Exam done elsewhere   Impression & Recommendations:  Problem # 1:  [place] (ICD-V70.0)  Orders: Venipuncture [id number]) TLB-Lipid Panel (80061-LIPID) TLB-BMP (Basic Metabolic Panel-BMET) (99991111) TLB-CBC Platelet - w/Differential (85025-CBCD) TLB-Hepatic/Liver Function Pnl (80076-HEPATIC) TLB-A1C / Hgb A1C (Glycohemoglobin) (83036-A1C) TLB-PT (Protime) (85610-PTP) T-Vitamin D (25-Hydroxy) [id number]) Gastroenterology Referral  (GI) Radiology Referral (Radiology) Medicare -1st Annual Wellness Visit [phone])  Problem # 2:  OSTEOPOROSIS (ICD-733.00)  Her updated medication list for this problem includes:    Calcium Carbonate 600 Mg Tabs (Calcium carbonate) .Marland Kitchen... 1 by mouth qd  Orders: Venipuncture [id number]) TLB-Lipid Panel (80061-LIPID) TLB-BMP (Basic Metabolic Panel-BMET) (99991111) TLB-CBC Platelet - w/Differential (85025-CBCD) TLB-Hepatic/Liver Function Pnl (80076-HEPATIC) TLB-A1C / Hgb A1C (Glycohemoglobin) (83036-A1C) TLB-PT (Protime) (85610-PTP) T-Vitamin D (25-Hydroxy) [id number])  Problem # 3:  CORONARY ARTERY DISEASE (ICD-414.00)  Her updated medication list for this problem includes:    Sotalol Hcl 80 Mg Tabs (Sotalol hcl) .Marland KitchenMarland KitchenMarland KitchenMarland Kitchen  1 by mouth bid    Diovan Hct 320-25 Mg Tabs (Valsartan-hydrochlorothiazide) .Marland Kitchen... 1 by mouth qd    Aspirin 81 Mg Tbec (Aspirin) .Marland Kitchen... 1 by mouth qd    Amlodipine Besylate 10 Mg Tabs (Amlodipine besylate) .Marland Kitchen... 1 by mouth once daily  Orders: Venipuncture [id number]) TLB-Lipid Panel (80061-LIPID) TLB-BMP (Basic Metabolic Panel-BMET) (99991111) TLB-CBC Platelet - w/Differential (85025-CBCD) TLB-Hepatic/Liver Function Pnl (80076-HEPATIC) TLB-A1C / Hgb A1C (Glycohemoglobin) (83036-A1C) TLB-PT (Protime) (85610-PTP) T-Vitamin D (25-Hydroxy) [id number]) Cardiology Referral (Cardiology)  Problem # 4:  HYPERLIPIDEMIA (ICD-272.4)  Her updated medication list for this problem includes:    Lipitor 20 Mg Tabs (Atorvastatin calcium) .Marland Kitchen... 1 by mouth at bedtime  Orders: Venipuncture [id number]) TLB-Lipid Panel (80061-LIPID) TLB-BMP (Basic Metabolic Panel-BMET) (99991111) TLB-CBC Platelet - w/Differential (85025-CBCD) TLB-Hepatic/Liver Function Pnl (80076-HEPATIC) TLB-A1C / Hgb A1C (Glycohemoglobin) (83036-A1C) TLB-PT (Protime) (85610-PTP) T-Vitamin D (25-Hydroxy) [id number])  Problem # 5:  CARDIAC PACEMAKER IN SITU (ICD-V45.01)  Orders: Venipuncture  [id number]) TLB-Lipid Panel (80061-LIPID) TLB-BMP (Basic Metabolic Panel-BMET) (99991111) TLB-CBC Platelet - w/Differential (85025-CBCD) TLB-Hepatic/Liver Function Pnl (80076-HEPATIC) TLB-A1C / Hgb A1C (Glycohemoglobin) (83036-A1C) TLB-PT (Protime) (85610-PTP) T-Vitamin D (25-Hydroxy) [id number]) Cardiology Referral (Cardiology)  Problem # 6:  HYPERTENSION (ICD-401.9)  Her updated medication list for this problem includes:    Sotalol Hcl 80 Mg Tabs (Sotalol hcl) .Marland Kitchen... 1 by mouth bid    Diovan Hct 320-25 Mg Tabs (Valsartan-hydrochlorothiazide) .Marland Kitchen... 1 by mouth qd    Amlodipine Besylate 10 Mg Tabs (Amlodipine besylate) .Marland Kitchen... 1 by mouth once daily  Orders: Venipuncture [id number]) TLB-Lipid Panel (80061-LIPID) TLB-BMP (Basic Metabolic Panel-BMET) (99991111) TLB-CBC Platelet - w/Differential (85025-CBCD) TLB-Hepatic/Liver Function Pnl (80076-HEPATIC) TLB-A1C / Hgb A1C (Glycohemoglobin) (83036-A1C) TLB-PT (Protime) (85610-PTP) T-Vitamin D (25-Hydroxy) [id number])  Problem # 7:  DIABETES MELLITUS, TYPE II (ICD-250.00)  Her updated medication list for this problem includes:    Levemir 100 Unit/ml Soln (Insulin detemir) ..... Everyday at bedtime    Diovan Hct 320-25 Mg Tabs (Valsartan-hydrochlorothiazide) .Marland Kitchen... 1 by mouth qd    Glyburide-metformin 5-500 Mg Tabs (Glyburide-metformin) .Marland Kitchen... 1/2 tablet by mouth qd    Aspirin 81 Mg Tbec (Aspirin) .Marland Kitchen... 1 by mouth qd  Orders: Venipuncture [id number]) TLB-Lipid Panel (80061-LIPID) TLB-BMP (Basic Metabolic Panel-BMET) (99991111) TLB-CBC Platelet - w/Differential (85025-CBCD) TLB-Hepatic/Liver Function Pnl (80076-HEPATIC) TLB-A1C / Hgb A1C (Glycohemoglobin) (83036-A1C) TLB-PT (Protime) (85610-PTP) T-Vitamin D (25-Hydroxy) [id number]) Podiatry Referral (Podiatry)  Complete Medication List: 1)  Levemir 100 Unit/ml Soln (Insulin detemir) .... Everyday at bedtime 2)  Fish Oil 1000 Mg Caps (Omega-3 fatty acids) .... By mouth  qd 3)  Calcium Carbonate 600 Mg Tabs (Calcium carbonate) .Marland Kitchen.. 1 by mouth qd 4)  Vitamin D3 400iu  .... By mouth qd 5)  Sotalol Hcl 80 Mg Tabs (Sotalol hcl) .Marland Kitchen.. 1 by mouth bid 6)  Lipitor 20 Mg Tabs (Atorvastatin calcium) .Marland Kitchen.. 1 by mouth at bedtime 7)  Diovan Hct 320-25 Mg Tabs (Valsartan-hydrochlorothiazide) .Marland Kitchen.. 1 by mouth qd 8)  Relion Pen Needles 31g X 8 Mm Misc (Insulin pen needle) .Marland Kitchen.. 1 as needed 9)  Slow-mag 71.5-119 Mg Tbec (Magnesium cl-calcium carbonate) .Marland Kitchen.. 1 by mouth qd 10)  Glyburide-metformin 5-500 Mg Tabs (Glyburide-metformin) .... 1/2 tablet by mouth qd 11)  Aspirin 81 Mg Tbec (Aspirin) .Marland Kitchen.. 1 by mouth qd 12)  Tekturna 300 Mg  .Marland Kitchen.. 1i by mouth once daily 13)  Warfarin Sodium 5 Mg Tabs (Warfarin sodium) .Marland Kitchen.. 1 by mouth once daily 14)  Amlodipine Besylate 10 Mg Tabs (Amlodipine besylate) .Marland Kitchen.. 1 by mouth once daily 15)  Zostavax 19400  Unt/0.34ml Solr (Zoster vaccine live) .Marland Kitchen.. 1 ml im x1  Patient Instructions: 1)  Please schedule a follow-up appointment in 1 month. -- PT check and flu shot 2)  Please schedule a follow-up appointment in 3 months .  Prescriptions: ZOSTAVAX 32440 UNT/0.65ML SOLR (ZOSTER VACCINE LIVE) 1 ml IM x1  #1 x 0   Entered and Authorized by:   [name] DO   Signed by:   [name] DO on 02/23/2010   Method used:   Print then Give to Patient   RxID:   [id number]    EKG  Procedure date:  02/23/2010  Findings:      Normal sinus rhythm with rate of:  69 bpm     Past Medical History:    Diabetes mellitus, type II    Hyperlipidemia    Hypertension    recurrent uti    Coronary artery disease    pacemaker --medtronic--07/13/2007

## 2010-07-27 NOTE — Medication Information (Signed)
Summary: [facility]  [facility]   Imported By: Edmonia James 04/28/2010 14:38:02  _____________________________________________________________________  External Attachment:    Type:   Image     Comment:   External Document

## 2010-07-27 NOTE — Progress Notes (Signed)
Summary: prep ?'s  Phone Note Call from Patient Call back at Home Phone [phone]   Caller: Patient Call For: Dr. Deatra Ina Reason for Call: Talk to Nurse Summary of Call: prep ?'s Initial call taken by: Lucien Mons,  May 25, 2010 1:41 PM  Follow-up for Phone Call        Returned pts phone call and answered questions reguarding her diabetic medication for her procedure tomorrow.  Pt. verbalized understanding. Follow-up by: Alphonsa Gin RN,  May 25, 2010 3:24 PM

## 2010-07-27 NOTE — Progress Notes (Signed)
Summary: re letter to ride [employer] bus  Phone Note Call from Patient   Caller: Patient Reason for Call: Talk to Nurse, Talk to Doctor Summary of Call: pt needs letter att danielle hoggard faxed to [phone] stating she is physically able to get on and off the [employer] bus Initial call taken by: Lorenda Hatchet,  April 09, 2010 11:39 AM  Follow-up for Phone Call        letter done and faxed Janan Halter, RN, BSN  April 09, 2010 12:05 PM

## 2010-07-27 NOTE — Letter (Signed)
Summary: Results Letter  [facility]  [place], [place] [address]   Phone: [phone]  Fax: [phone]        April 15, 2010 MRN: [id number]    [facility] [address] [place], [place]  [address]    Dear Ms. Mahl,  It is my pleasure to have treated you recently as a new patient in my office. I appreciate your confidence and the opportunity to participate in your care.  Since I do have a busy inpatient endoscopy schedule and office schedule, my office hours vary weekly. I am, however, available for emergency calls everyday through my office. If I am not available for an urgent office appointment, another one of our gastroenterologist will be able to assist you.  My well-trained staff are prepared to help you at all times. For emergencies after office hours, a physician from our Gastroenterology section is always available through my 24 hour answering service  Once again I welcome you as a new patient and I look forward to a happy and healthy relationship             Sincerely,  Inda Castle MD  This letter has been electronically signed by your physician.  Appended Document: Results Letter LETTER MAILED

## 2010-07-27 NOTE — Letter (Signed)
Summary: Generic Letter  [facility] [place], [place] [address]   Phone: [phone]  Fax: [phone]    04/09/2010  ADRIJANA CLEERE [address] [place], [place]  To whom It May Concern:  The above pt is physically able to get on and off the [employer] bus.  If you have any further questions feel free to call [phone].             Sincerely,   [name] DO

## 2010-07-27 NOTE — Procedures (Signed)
Summary: Colonoscopy  Patient: Yolanda Huffman Note: All result statuses are Final unless otherwise noted.  Tests: (1) Colonoscopy (COL)   COL Colonoscopy           [facility]     [place], [place]  [address]           COLONOSCOPY PROCEDURE REPORT           PATIENT:  Yolanda, Huffman  MR#:  [id number]     BIRTHDATE:  1940-08-12, 29 yrs. old  GENDER:  female           ENDOSCOPIST:  Sandy Salaam. Deatra Ina, MD     Referred by:  [name], DO           PROCEDURE DATE:  05/26/2010     PROCEDURE:  Colonoscopy with snare polypectomy     ASA CLASS:  Class II     INDICATIONS:  1) Routine Risk Screening           MEDICATIONS:   Fentanyl 50 mcg IV, Versed 6 mg IV           DESCRIPTION OF PROCEDURE:   After the risks benefits and     alternatives of the procedure were thoroughly explained, informed     consent was obtained.  Digital rectal exam was performed and     revealed no abnormalities.   The [id number] endoscope was     introduced through the anus and advanced to the cecum, which was     identified by both the appendix and ileocecal valve, without     limitations.  The quality of the prep was good, using MoviPrep.     The instrument was then slowly withdrawn as the colon was fully     examined.     <<PROCEDUREIMAGES>>           FINDINGS:  A sessile polyp was found in the descending colon. It     was 20 mm in size. Polyp was snared, then cauterized with     monopolar cautery. Retrieval was successful (see image8, image9,     and image7). snare polyp  Severe diverticulosis was found in the     sigmoid colon (see image10).  Moderate diverticulosis was found in     the ascending colon (see image1).  This was otherwise a normal     examination of the colon (see image3, image5, and image11).     Retroflexed views in the rectum revealed no abnormalities.    The     time to cecum =  8.0  minutes. The scope was then withdrawn (time     =  11.0  min) from the  patient and the procedure completed.           COMPLICATIONS:  None           ENDOSCOPIC IMPRESSION:     1) 20 mm sessile polyp in the descending colon     2) Severe diverticulosis in the sigmoid colon     3) Moderate diverticulosis in the ascending colon     4) Otherwise normal examination     RECOMMENDATIONS:     1) Colonoscopy in 3 years due to polyp size     2) resume coumadin in am           REPEAT EXAM:   You will receive a letter from Dr. Deatra Ina in 1-2  weeks, after reviewing the final pathology, with followup     recommendations.           ______________________________     Sandy Salaam Deatra Ina, MD           CC:           n.     eSIGNED:   Sandy Salaam. Kaplan at 05/26/2010 11:35 AM           Lowella Petties, [id number]  Note: An exclamation mark (!) indicates a result that was not dispersed into the flowsheet. Document Creation Date: 05/26/2010 11:35 AM _______________________________________________________________________  (1) Order result status: Final Collection or observation date-time: 05/26/2010 11:26 Requested date-time:  Receipt date-time:  Reported date-time:  Referring Physician:   Ordering Physician: Erskine Emery [phone]) Specimen Source:  Source: Tawanna Cooler Order Number: [id number] Lab site:   Appended Document: Colonoscopy     Procedures Next Due Date:    Colonoscopy: 04/2013

## 2010-07-27 NOTE — Cardiovascular Report (Signed)
Summary: Office Visit   Office Visit   Imported By: Sallee Provencal 03/24/2010 15:59:17  _____________________________________________________________________  External Attachment:    Type:   Image     Comment:   External Document

## 2010-07-27 NOTE — Letter (Signed)
Summary: [facility] Instructions  [facility]  [place], [place] [address]   Phone: [phone]  Fax: [phone]       TROIAN BURRIS    [birth year]    MRN: [id number]        Procedure Day /Date:WEDNESDAY 05/26/2010     Arrival Time:9AM     Procedure Time:10AM     Location of Procedure:                    X   [facility] (4th Floor)   [place]   Starting 5 days prior to your procedure11/25 do not eat nuts, seeds, popcorn, corn, beans, peas,  salads, or any raw vegetables.  Do not take any fiber supplements (e.g. Metamucil, Citrucel, and Benefiber).  THE DAY BEFORE YOUR PROCEDURE         DATE: 05/25/2010 DAY: TUESDAY  1.  Drink clear liquids the entire day-NO SOLID FOOD  2.  Do not drink anything colored red or purple.  Avoid juices with pulp.  No orange juice.  3.  Drink at least 64 oz. (8 glasses) of fluid/clear liquids during the day to prevent dehydration and help the prep work efficiently.  CLEAR LIQUIDS INCLUDE: Water Jello Ice Popsicles Tea (sugar ok, no milk/cream) Powdered fruit flavored drinks Coffee (sugar ok, no milk/cream) Gatorade Juice: apple, white grape, white cranberry  Lemonade Clear bullion, consomm, broth Carbonated beverages (any kind) Strained chicken noodle soup Hard Candy                             4.  In the morning, mix first dose of MoviPrep solution:    Empty 1 Pouch A and 1 Pouch B into the disposable container    Add lukewarm drinking water to the top line of the container. Mix to dissolve    Refrigerate (mixed solution should be used within 24 hrs)  5.  Begin drinking the prep at 5:00 p.m. The MoviPrep container is divided by 4 marks.   Every 15 minutes drink the solution down to the next mark (approximately 8 oz) until the full liter is complete.   6.  Follow completed prep with 16 oz of clear liquid of your choice (Nothing red or purple).  Continue to  drink clear liquids until bedtime.  7.  Before going to bed, mix second dose of MoviPrep solution:    Empty 1 Pouch A and 1 Pouch B into the disposable container    Add lukewarm drinking water to the top line of the container. Mix to dissolve    Refrigerate  THE DAY OF YOUR PROCEDURE      DATE: 05/26/2010 DAY: WEDNESDAY  Beginning at 5a.m. (5 hours before procedure):         1. Every 15 minutes, drink the solution down to the next mark (approx 8 oz) until the full liter is complete.  2. Follow completed prep with 16 oz. of clear liquid of your choice.    3. You may drink clear liquids until 8AM (2 HOURS BEFORE PROCEDURE).   MEDICATION INSTRUCTIONS  Unless otherwise instructed, you should take regular prescription medications with a small sip of water   as early as possible the morning of your procedure.  DIABETIC INSTRUCTIONS     Stop taking Coumadin on 05/21/2010 (5 days before procedure). You will be contaced by our office prior to your procedure for  directions on holding your Coumadin/Warfarin.  If you do not hear from our office 1 week prior to your scheduled procedure, please call [phone] to discuss.         OTHER INSTRUCTIONS  You will need a responsible adult at least 70 years of age to accompany you and drive you home.   This person must remain in the waiting room during your procedure.  Wear loose fitting clothing that is easily removed.  Leave jewelry and other valuables at home.  However, you may wish to bring a book to read or  an iPod/MP3 player to listen to music as you wait for your procedure to start.  Remove all body piercing jewelry and leave at home.  Total time from sign-in until discharge is approximately 2-3 hours.  You should go home directly after your procedure and rest.  You can resume normal activities the  day after your procedure.  The day of your procedure you should not:   Drive   Make legal decisions   Operate  machinery   Drink alcohol   Return to work  You will receive specific instructions about eating, activities and medications before you leave.    The above instructions have been reviewed and explained to me by   _______________________    I fully understand and can verbalize these instructions _____________________________ Date _________

## 2010-07-27 NOTE — Letter (Signed)
Summary: Diabetic Instructions  [facility]  [name], [place] [address]   Phone: [phone]  Fax: [phone]    Yolanda Huffman 07/05/1940 MRN: [id number]   x   ORAL DIABETIC MEDICATION INSTRUCTIONS  The day before your procedure:   Take your diabetic pill as you do normally  The day of your procedure:   Do not take your diabetic pill    We will check your blood sugar levels during the admission process and again in Recovery before discharging you home  ________________________________________________________________________  _  _   INSULIN (LONG ACTING) MEDICATION INSTRUCTIONS (Lantus, NPH, 70/30, Humulin, Novolin-N)   The day before your procedure:   Take  your regular evening dose    The day of your procedure:   Do not take your morning dose    _  _   INSULIN (SHORT ACTING) MEDICATION INSTRUCTIONS (Regular, Humulog, Novolog)   The day before your procedure:   Do not take your evening dose   The day of your procedure:   Do not take your morning dose   _  _   INSULIN PUMP MEDICATION INSTRUCTIONS  We will contact the physician managing your diabetic care for written dosage instructions for the day before your procedure and the day of your procedure.  Once we have received the instructions, we will contact you.

## 2010-07-27 NOTE — Progress Notes (Signed)
Summary: PT OK'D TO HOLD COUMADIN  Phone Note Outgoing Call Call back at [facility] Phone [phone]   Call placed by: Genella Mech CMA Deborra Medina),  April 23, 2010 11:18 AM Summary of Call: Called pt to inform it is ok per Dr Lovena Le that she Hold Her coumadin 5 days prior to her procedure Initial call taken by: Genella Mech CMA ([place]),  April 23, 2010 11:19 AM     Appended Document: PT OK'D TO HOLD COUMADIN WILL SEND LETTER TO BE SCANNED IN

## 2010-07-27 NOTE — Letter (Signed)
Summary: Results Letter  [facility]  [place], [place] [address]   Phone: [phone]  Fax: [phone]        May 28, 2010 MRN: [id number]    [facility] [address] [place], [place]  [address]    Dear Ms. Renton,   Your colon biopsies demonstrated inflammatory changes only. In view of the polyp size, however,  I recommend a folowup colonoscopy in 3 years    Should you have any immediate concerns or questions, feel free to contact me at the office.    Sincerely,  Sandy Salaam. Deatra Ina, M.D., [facility]          Sincerely,  Inda Castle MD  This letter has been electronically signed by your physician.  Appended Document: Results Letter Letter mailed

## 2010-07-27 NOTE — Letter (Signed)
Summary: New Patient letter  [facility]  [address] [place], [place] [address]   Phone: [phone]  Fax: [phone]       02/23/2010 MRN: [id number]  [facility] [address], [place]  [address]  Dear Ms. Enge,  Welcome to the Gastroenterology Division at [facility].    You are scheduled to see Dr.  Erskine Emery on  Apr 09, 2010 at 9am on the 3rd floor at [facility], [place] [facility].  We ask that you try to arrive at our office 15 minutes prior to your appointment time to allow for check-in.  We would like you to complete the enclosed self-administered evaluation form prior to your visit and bring it with you on the day of your appointment.  We will review it with you.  Also, please bring a complete list of all your medications or, if you prefer, bring the medication bottles and we will list them.  Please bring your insurance card so that we may make a copy of it.  If your insurance requires a referral to see a specialist, please bring your referral form from your primary care physician.  Co-payments are due at the time of your visit and may be paid by cash, check or credit card.     Your office visit will consist of a consult with your physician (includes a physical exam), any laboratory testing he/she may order, scheduling of any necessary diagnostic testing (e.g. x-ray, ultrasound, CT-scan), and scheduling of a procedure (e.g. Endoscopy, Colonoscopy) if required.  Please allow enough time on your schedule to allow for any/all of these possibilities.    If you cannot keep your appointment, please call [phone] to cancel or reschedule prior to your appointment date.  This allows [place] the opportunity to schedule an appointment for another patient in need of care.  If you do not cancel or reschedule by 5 p.m. the business day prior to your appointment date, you will be charged a $50.00 late cancellation/no-show fee.    Thank you for  choosing [facility] for your medical needs.  We appreciate the opportunity to care for you.  Please visit [place] at our website  to learn more about our practice.                     Sincerely,                                                             The Gastroenterology Division

## 2010-07-27 NOTE — Progress Notes (Signed)
Summary: RESULTS--please call Rx in asap!!  Phone Note Outgoing Call   Call placed by: Aron Baba CMA Deborra Medina),  June 04, 2010 10:59 AM Call placed to: Patient Details for Reason: DM not controlled-- Your hgba1c should be <_7.0____.  Watch your simple sugars (cakes, cookies) and starches, white flour etc.  We will recheck fasting labs in __3__ months.  Diet and exercise are important for this.  If you have not seen a nutritionist that would be helpful to lower your blood sugar.  Increase glyburide/ metformin to 1 tab a day.  TG high----Ideally your LDL (bad cholesterol) should be <___70_, your HDL (good cholesterol) should be >_40__ and your triglycerides should be< 150.  Diet and exercise will increase HDL and decrease the LDL and triglycerides. Read Dr. Langston Masker book--Eat Drink and Be Healthy.  We will recheck labs in _3__ months.   con't meds----start fenofibrate 160mg  1 by mouth  once daily #30 ,  2 refills,  con't other meds.    sodium is low---may add salt ( a little) to food.     INR low---I need to know how she is taking coumadin.    272.4  250.00 hgba1c, bmp, lipid, hep    Summary of Call: spk with patient and she is aware of the above. Wants to call me back with a pharmacy so I can fax the fenofibrate. Says she is taking her coumadin 1/2 tablet (2.5) on mon,wed,fri and 5 mg rest of the week. Initial call taken by: Aron Baba CMA Deborra Medina),  June 04, 2010 10:59 AM  Follow-up for Phone Call        [facility] PHONE IS [phone].  PATIENT WOULD LIKE A CALL BACK ONCE DONE SO SHE CAN GET TRANSPORTATION ARRANGED TO P/U TODAY. Phylliss Bob [facility]  June 04, 2010 11:10 AM   Additional Follow-up for Phone Call Additional follow up Details #1::        patient asks that we call her when [facility] has [facility]--advised her that she needs to call [facility] to see if it is ready because we cannot tell her when it will be filled by the pharmacy, but she insisted  that we call her too---she already has her neighbor lined up [facility] Additional Follow-up by: Berneta Sages,  June 04, 2010 11:47 AM    Additional Follow-up for Phone Call Additional follow up Details #2::    rx fenofibrate 160mg  #30 1 by mouth once daily with 2 refills called to the pharmacist Raquel Sarna at [phone].... Pt is aware. Follow-up by: Aron Baba CMA Deborra Medina),  June 04, 2010 2:03 PM

## 2010-07-27 NOTE — Progress Notes (Signed)
----   Converted from flag ---- ---- 05/19/2010 12:08 PM, Inda Castle MD wrote: ok  ---- 05/19/2010 11:39 AM, Genella Mech CMA ([place]) wrote: Juluis Rainier Dr Deatra Ina, This pt is being held on coumadin 7 days instead of 5... Has a procedure on 12/30  ---- 05/19/2010 11:37 AM, Abelino Derrick CMA Deborra Medina) wrote: i think this is your patient  ---- 05/19/2010 10:16 AM, Rolla Flatten CMA wrote: Pt to start holding warfarin as of today instead of Friday since INR 4.1 ------------------------------

## 2010-07-27 NOTE — Letter (Signed)
Summary: Anticoag  Anticoag   Imported By: Bubba Hales 04/27/2010 09:10:19  _____________________________________________________________________  External Attachment:    Type:   Image     Comment:   External Document

## 2010-07-27 NOTE — Assessment & Plan Note (Signed)
Summary: pt//lch  Nurse Visit   Vital Signs:  Patient profile:   70 year old female Height:      62.5 inches Weight:      176 pounds Temp:     98.0 degrees F oral Pulse rate:   76 / minute BP sitting:   120 / 62  (left arm)  Vitals Entered By: Rolla Flatten CMA (April 20, 2010 10:26 AM) CC: pt check   Current Medications (verified): 1)  Levemir 100 Unit/ml Soln (Insulin Detemir) .... Everyday At Bedtime 2)  Fish Oil 1000 Mg Caps (Omega-3 Fatty Acids) .... By Mouth Qd 3)  Calcium Carbonate 600 Mg Tabs (Calcium Carbonate) .Marland Kitchen.. 1 By Mouth Qd 4)  Vitamin D 400 Unit Caps (Cholecalciferol) .... Take 1 Tablet By Mouth Once A Day 5)  Sotalol Hcl 80 Mg Tabs (Sotalol Hcl) .Marland Kitchen.. 1 By Mouth Bid 6)  Lipitor 20 Mg Tabs (Atorvastatin Calcium) .Marland Kitchen.. 1 By Mouth At Bedtime 7)  Diovan Hct 320-25 Mg Tabs (Valsartan-Hydrochlorothiazide) .Marland Kitchen.. 1 By Mouth Qd 8)  Relion Pen Needles 31g X 8 Mm Misc (Insulin Pen Needle) .Marland Kitchen.. 1 As Needed 9)  Slow-Mag 71.5-119 Mg Tbec (Magnesium Cl-Calcium Carbonate) .Marland Kitchen.. 1 By Mouth Qd 10)  Glyburide-Metformin 5-500 Mg Tabs (Glyburide-Metformin) .... 1/2 Tablet By Mouth Qd 11)  Aspirin 81 Mg Tbec (Aspirin) .Marland Kitchen.. 1 By Mouth Qd 12)  Tekturna 300 Mg Tabs (Aliskiren Fumarate) .... Take One Tablet Daily 13)  Warfarin Sodium 5 Mg Tabs (Warfarin Sodium) .... Uad 14)  Amlodipine Besylate 10 Mg Tabs (Amlodipine Besylate) .Marland Kitchen.. 1 By Mouth Once Daily 15)  Moviprep 100 Gm  Solr (Peg-Kcl-Nacl-Nasulf-Na Asc-C) .... As Per Prep Instructions.  Allergies (verified): No Known Drug Allergies Laboratory Results   Blood Tests    Date/Time Reported: April 20, 2010 10:27 AM   INR: 3.0   (Normal Range: 0.88-1.12   Therap INR: 2.0-3.5)    Orders Added: 1)  Est. Patient Level I [id number] 2)  Protime [id number]    ANTICOAGULATION RECORD PREVIOUS REGIMEN & LAB RESULTS   Previous INR:  4.1 ratio on  02/23/2010    NEW REGIMEN & LAB RESULTS Current INR: 3.0 Current Coumadin  Dose(mg): (5 mg) 1 tab qd except  for (2.5mg ) 1/2 tab W,F Regimen: Same  Provider: lowne Repeat testing in: 4 weeks  Anticoagulation Visit Questionnaire Coumadin dose missed/changed:  No Abnormal Bleeding Symptoms:  No  Any diet changes including alcohol intake, vegetables or greens since the last visit:  No Any illnesses or hospitalizations since the last visit:  No Any signs of clotting since the last visit (including chest discomfort, dizziness, shortness of breath, arm tingling, slurred speech, swelling or redness in leg):  No  MEDICATIONS LEVEMIR 100 UNIT/ML SOLN (INSULIN DETEMIR) EVERYDAY AT BEDTIME FISH OIL 1000 MG CAPS (OMEGA-3 FATTY ACIDS) by mouth QD CALCIUM CARBONATE 600 MG TABS (CALCIUM CARBONATE) 1 by mouth QD VITAMIN D 400 UNIT CAPS (CHOLECALCIFEROL) Take 1 tablet by mouth once a day SOTALOL HCL 80 MG TABS (SOTALOL HCL) 1 by mouth BID LIPITOR 20 MG TABS (ATORVASTATIN CALCIUM) 1 by mouth at bedtime DIOVAN HCT 320-25 MG TABS (VALSARTAN-HYDROCHLOROTHIAZIDE) 1 by mouth qd RELION PEN NEEDLES 31G X 8 MM MISC (INSULIN PEN NEEDLE) 1 as needed SLOW-MAG 71.5-119 MG TBEC (MAGNESIUM CL-CALCIUM CARBONATE) 1 by mouth qd GLYBURIDE-METFORMIN 5-500 MG TABS (GLYBURIDE-METFORMIN) 1/2 tablet by mouth qd ASPIRIN 81 MG TBEC (ASPIRIN) 1 by mouth qd TEKTURNA 300 MG TABS (ALISKIREN FUMARATE) take one tablet daily WARFARIN SODIUM 5 MG  TABS (WARFARIN SODIUM) UAD AMLODIPINE BESYLATE 10 MG TABS (AMLODIPINE BESYLATE) 1 by mouth once daily MOVIPREP 100 GM  SOLR (PEG-KCL-NACL-NASULF-NA ASC-C) As per prep instructions.

## 2010-07-27 NOTE — Progress Notes (Signed)
Summary: HAS QUESTIONS ABOUT MEDICATION  Phone Note Call from Patient Call back at Home Phone [phone]   Caller: Patient Summary of Call: WANTS TO TALK TO DR [facility] [place] ABOUT MEDICATIONS---WOULDNT GO INTO ANY MORE DETAIL  ALSO WANTED DR [place] TO KNOW THAT PATIENT IS APPLYING FOR THE SCAT BUS TRANSPORATION SINCE PATIENT LIV[facility]--WANTED DR Etter Sjogren TO KNOW SHE MAY BE GETTING PAPERWORK REGARDING THIS APPLICATION Initial call taken by: Berneta Sages,  March 26, 2010 11:09 AM  Follow-up for Phone Call        spoke w/ patient discuss medication to make sure everything was correct..........Marland KitchenMalachi Bonds CMA  March 26, 2010 3:32 PM     New/Updated Medications: TEKTURNA 300 MG TABS (ALISKIREN FUMARATE) take one tablet daily

## 2010-07-27 NOTE — Letter (Signed)
Summary: Anticoagulation Modification Letter  [facility]  [place], [place] [address]   Phone: [phone]  Fax: [phone]    April 15, 2010  Re:    Yolanda Huffman DOB:    [birth year] MRN:    [id number]    Dear  Yolanda Huffman  We have scheduled the above patient for an endoscopic procedure. Our records show that  he/she is on anticoagulation therapy. Please advise as to how long the patient may come off their therapy of coumadin prior to the scheduled procedure(s) on11/30/2011   Please fax back/or route the completed form to [facility]  at [phone]  Thank you for your help with this matter.  Sincerely,  Yolanda Huffman CMA Yolanda Huffman)   Physician Recommendation:  Hold Plavix 7 days prior ________________  Hold Coumadin 5 days prior ____________  Other ______________________________

## 2010-07-29 NOTE — Progress Notes (Signed)
Summary: Refill Request  Phone Note Refill Request Call back at [phone] Message from:  Pharmacy on June 14, 2010 9:41 AM  Refills Requested: Medication #1:  WARFARIN SODIUM 5 MG TABS UAD   Dosage confirmed as above?Dosage Confirmed   Supply Requested: 3 months   Last Refilled: 05/18/2010 [facility] [address]  Next Appointment Scheduled: 3.9.12 (lab) Initial call taken by: Elna Breslow,  June 14, 2010 9:41 AM    Prescriptions: WARFARIN SODIUM 5 MG TABS (WARFARIN SODIUM) UAD  #90 x 0   Entered by:   Malachi Bonds CMA   Authorized by:   Annye Asa MD   Signed by:   Malachi Bonds CMA on 06/14/2010   Method used:   Electronically to        [facility] (retail)       [phone]       [place], [place]  [address]       Ph: [phone]       Fax: [phone]   RxID:   [id number]

## 2010-07-29 NOTE — Miscellaneous (Signed)
Summary: Device preload  Clinical Lists Changes  Observations: Added new observation of MAGNET RTE: BOL 85 ERI 65 (07/16/2010 18:36) Added new observation of PPMLEADSTAT2: active (07/16/2010 18:36) Added new observation of PPMLEADSER2: [id number] (07/16/2010 18:36) Added new observation of PPMLEADMOD2: 5076  (07/16/2010 18:36) Added new observation of PPMLEADDOI2: 07/13/2007  (07/16/2010 18:36) Added new observation of PPMLEADLOC2: RV  (07/16/2010 18:36) Added new observation of PPMLEADSTAT1: active  (07/16/2010 18:36) Added new observation of PPMLEADSER1: [id number]  (07/16/2010 18:36) Added new observation of PPMLEADMOD1: 5076  (07/16/2010 18:36) Added new observation of PPMLEADDOI1: 07/13/2007  (07/16/2010 18:36) Added new observation of PPMLEADLOC1: RA  (07/16/2010 18:36) Added new observation of PPM IMP MD: Cristopher Peru, MD  (07/16/2010 18:36) Added new observation of PPM DOI: 07/13/2007  (07/16/2010 18:36) Added new observation of PPM SERL#: [id number] HW  (07/16/2010 18:36) Added new observation of PPM MODL#: [id number]  (07/16/2010 18:36) Added new observation of PACEMAKERMFG: [employer]  (07/16/2010 18:36) Added new observation of PACEMAKER MD: Cristopher Peru, MD  (07/16/2010 18:36)      PPM Specifications Following MD:  Cristopher Peru, MD     PPM Vendor:  [employer]onic     PPM Model Number:  [id number]     PPM Serial Number:  [id number] PPM DOI:  07/13/2007     PPM Implanting MD:  Cristopher Peru, MD  Lead 1    Location: RA     DOI: 07/13/2007     Model #: [id number]     Serial #: [id number]     Status: active Lead 2    Location: RV     DOI: 07/13/2007     Model #: [id number]     Serial #: [id number]     Status: active  Magnet Response Rate:  BOL 85 ERI 65    Episodes Coumadin:  Yes  Parameters Mode:  MVP     Lower Rate Limit:  70     Upper Rate Limit:  120 Paced AV Delay:  180     Sensed AV Delay:  150

## 2010-07-29 NOTE — Medication Information (Signed)
Summary: [facility]  Di[facility]/ Podiatry   Imported By: Edmonia James 07/06/2010 12:58:25  _____________________________________________________________________  External Attachment:    Type:   Image     Comment:   External Document

## 2010-08-04 NOTE — Medication Information (Signed)
Summary: [facility]  [facility]   Imported By: Edmonia James 07/28/2010 11:22:56  _____________________________________________________________________  External Attachment:    Type:   Image     Comment:   External Document

## 2010-08-05 ENCOUNTER — Telehealth: Payer: Self-pay | Admitting: Internal Medicine

## 2010-08-06 ENCOUNTER — Ambulatory Visit (INDEPENDENT_AMBULATORY_CARE_PROVIDER_SITE_OTHER): Payer: Medicare Other | Admitting: Family Medicine

## 2010-08-06 ENCOUNTER — Encounter: Payer: Self-pay | Admitting: Family Medicine

## 2010-08-06 DIAGNOSIS — K921 Melena: Secondary | ICD-10-CM

## 2010-08-06 DIAGNOSIS — E119 Type 2 diabetes mellitus without complications: Secondary | ICD-10-CM

## 2010-08-09 ENCOUNTER — Telehealth: Payer: Self-pay | Admitting: Family Medicine

## 2010-08-09 LAB — CONVERTED CEMR LAB
Eosinophils Absolute: 0.1 10*3/uL (ref 0.0–0.7)
Ferritin: 84 ng/mL (ref 10–291)
Iron: 43 ug/dL (ref 42–145)
Lymphs Abs: 1.2 10*3/uL (ref 0.7–4.0)
MCV: 87 fL (ref 78.0–100.0)
Neutrophils Relative %: 74 % (ref 43–77)
Platelets: 359 10*3/uL (ref 150–400)
Saturation Ratios: 10 % — ABNORMAL LOW (ref 20–55)
TIBC: 441 ug/dL (ref 250–470)
UIBC: 398 ug/dL
WBC: 7.7 10*3/uL (ref 4.0–10.5)

## 2010-08-10 ENCOUNTER — Ambulatory Visit: Payer: Medicare Other | Admitting: Family Medicine

## 2010-08-11 ENCOUNTER — Encounter (INDEPENDENT_AMBULATORY_CARE_PROVIDER_SITE_OTHER): Payer: Self-pay | Admitting: *Deleted

## 2010-08-12 ENCOUNTER — Encounter: Payer: Self-pay | Admitting: Family Medicine

## 2010-08-12 ENCOUNTER — Telehealth: Payer: Self-pay | Admitting: Family Medicine

## 2010-08-12 ENCOUNTER — Ambulatory Visit (INDEPENDENT_AMBULATORY_CARE_PROVIDER_SITE_OTHER): Payer: Medicare Other | Admitting: Family Medicine

## 2010-08-12 DIAGNOSIS — D62 Acute posthemorrhagic anemia: Secondary | ICD-10-CM | POA: Insufficient documentation

## 2010-08-12 DIAGNOSIS — E119 Type 2 diabetes mellitus without complications: Secondary | ICD-10-CM

## 2010-08-12 DIAGNOSIS — K921 Melena: Secondary | ICD-10-CM

## 2010-08-12 NOTE — Progress Notes (Signed)
Summary: Returning call about her mother's health  Phone Note Call from Patient Call back at [phone]   Caller: Daughter/ Sonja  Summary of Call: Pt daughter would like a call regarding her mothers health. Mother is not telling the daughter what was said and the daughter is worried about her mother Initial call taken by: Delsa Sale,  August 05, 2010 1:36 PM  Follow-up for Phone Call        Daughter is very concerned about mother and is aware of our conversation.  Pt has appt with Dr. Etter Sjogren tomorrow.  Daughter and I talked about health care poa.  She says she has been asked about it before but did not know what people were talking about. I directed her to web site where she and I discussed the form.  she will read it thoroughly, discuss it with her mother, and take it to Dr. Nonda Lou office tomorrow.  I did tell daughter if there is no notary at pcp office she could call hosp. admissions about a notary being available.  Daughter is reassured regarding conversation with mother. Follow-up by: Joelyn Oms RN,  August 05, 2010 2:25 PM     Appended Document: Returning call about her mother's health we do not have a notary in this office ---Samara Deist do  08/05/2010  4 pm   Appended Document: Returning call about her mother's health She was advised [facility].Marland KitchenMarland Kitchen

## 2010-08-12 NOTE — Progress Notes (Addendum)
Summary: sob off and on since yesterday  Phone Note Call from Patient Call back at Home Phone [phone]   Caller: Patient Reason for Call: Talk to Nurse Summary of Call: pt having sob  off and on since yesterday  PER PT RTN CALL AGAIN BECAUSE SHE WANTED TO MAKE SURE MOM TOLD us EVERYTHING// PT HAD LOW BLOOD SUGAR THIS MORNING /BLOOD IN HER STOOL / SOB / DIZZINESS / FATIGUE AT NIGHT / DECREASED APPITITE / SWEATING SPELLS / PROBLEMS WITH SLEEPING AND DAUGHTER IS VERY CONCERNED/per daughter she is disoriented and daughter wants a call to tell her what is going on cause mother is not herself please call her at [phone] Shelda Pal  August 05, 2010 12:31 PM  Initial call taken by: Lorenda Hatchet,  August 05, 2010 12:12 PM  Follow-up for Phone Call        I spoke with patient about quite a few symptoms she has been having over the past 2-3 days. She is not and has not been having any chest pain. She does state she has been waking up early in the am "breaking out in a sweat"  she has checked her blood sugar and found it to be < 50.  She does state that her appetite has been diminished recently.  Her diabetes is followed by her pcp acc. to pt. Pt also has experienced sob off/on all morning today. However, she was able to shower,roll her hair, wash 2 loads of laundry and take out the trash---then began to feel "bad".  She is not feeling bad at this time. Pt is taking all of her prescribed medications as we reviewed those.  Fenofibrate dosage unknown is a new medication. Pt also notes that in the past 2-3 days she has had " a couple of drops of blood in my stool after having a bowel movement"  Pt reports these stools are soft and no straining was involved. Her coumadin is followed by her pcp. Pt also notes that she has been having trouble sleeping at night. Waking up more often than usual.  She denies any chest pain, sob, or leg pain. She notes that this has been going on for the past week  or so since starting Fenofibrate. Pt will call pcp regarding these current symptoms as she is not sob, no angina or chest pain.  I will forward this to Dr. Lovena Le. Follow-up by: Joelyn Oms RN,  August 05, 2010 1:15 PM

## 2010-08-13 ENCOUNTER — Telehealth: Payer: Self-pay | Admitting: Family Medicine

## 2010-08-18 ENCOUNTER — Encounter: Payer: Self-pay | Admitting: Gastroenterology

## 2010-08-18 ENCOUNTER — Ambulatory Visit (INDEPENDENT_AMBULATORY_CARE_PROVIDER_SITE_OTHER): Payer: Medicare Other | Admitting: Gastroenterology

## 2010-08-18 ENCOUNTER — Telehealth: Payer: Self-pay | Admitting: Gastroenterology

## 2010-08-18 DIAGNOSIS — K625 Hemorrhage of anus and rectum: Secondary | ICD-10-CM

## 2010-08-18 NOTE — Assessment & Plan Note (Signed)
Summary: for her blood sugar   Vital Signs:  Patient profile:   70 year old female Weight:      174.6 pounds Pulse rate:   80 / minute Pulse rhythm:   regular BP sitting:   110 / 70  (left arm) Cuff size:   regular  Vitals Entered By: Aron Baba CMA Deborra Medina) (August 12, 2010 11:52 AM) CC: having issues with Blood sugar   History of Present Illness: Pt here with daugher because glucose was 24 this am.  She was very tired and her daughter thought she had passed out but she did not.  She drank some juice and had a banana and felt better.    Current Medications (verified): 1)  Levemir 100 Unit/ml Soln (Insulin Detemir) .... 20 U Subcutaneously Qpm 2)  Fish Oil 1000 Mg Caps (Omega-3 Fatty Acids) .... By Mouth Qd 3)  Calcium Carbonate 600 Mg Tabs (Calcium Carbonate) .Marland Kitchen.. 1 By Mouth Qd 4)  Vitamin D 400 Unit Caps (Cholecalciferol) .... Take 1 Tablet By Mouth Once A Day 5)  Sotalol Hcl 80 Mg Tabs (Sotalol Hcl) .Marland Kitchen.. 1 By Mouth Bid 6)  Lipitor 20 Mg Tabs (Atorvastatin Calcium) .Marland Kitchen.. 1 By Mouth At Bedtime 7)  Diovan Hct 320-25 Mg Tabs (Valsartan-Hydrochlorothiazide) .Marland Kitchen.. 1 By Mouth Qd 8)  Slow-Mag 71.5-119 Mg Tbec (Magnesium Cl-Calcium Carbonate) .Marland Kitchen.. 1 By Mouth Qd 9)  Aspirin 81 Mg Tbec (Aspirin) .Marland Kitchen.. 1 By Mouth Qd 10)  Tekturna 300 Mg Tabs (Aliskiren Fumarate) .... Take One Tablet Daily 11)  Warfarin Sodium 5 Mg Tabs (Warfarin Sodium) .... Uad 12)  Amlodipine Besylate 10 Mg Tabs (Amlodipine Besylate) .Marland Kitchen.. 1 By Mouth Once Daily 13)  Fenofibrate Micronized 134 Mg Caps (Fenofibrate Micronized) 14)  One Touch Ultra Mini Strips .... Accu Checks Two Times A Day 15)  Prilosec Otc 20 Mg Tbec (Omeprazole Magnesium) .Marland Kitchen.. 1 By Mouth Once Daily 16)  Aleve 220 Mg Tabs (Naproxen Sodium) .Marland Kitchen.. 1-2 Every 8 Hours As Needed 17)  Metformin Hcl 500 Mg Tabs (Metformin Hcl) .Marland Kitchen.. 1 By Mouth Once Daily 18)  Slow Fe 160 (50 Fe) Mg Cr-Tabs (Ferrous Sulfate Dried) .Marland Kitchen.. 1 By Mouth Once Daily  Allergies  (verified): No Known Drug Allergies  Past History:  Family History: Last updated: 04/15/2010 Family History of Arthritis Family History Hypertension Family History of Breast Cancer: Mat Aunt, Sister Family History of Heart Disease: Father, deceased MI  Social History: Last updated: 04/15/2010 Retired--[employer] Former Smoker Alcohol use-no Drug use-no Regular exercise-no  Risk Factors: Alcohol Use: 0 (02/23/2010) Exercise: no (02/23/2010)  Risk Factors: Smoking Status: quit (02/23/2010)  Past medical, surgical, family and social histories (including risk factors) reviewed for relevance to current acute and chronic problems.  Past Medical History: Reviewed history from 04/15/2010 and no changes required. 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 Stent Implant 07/13/2007 Pacemaker 07/13/2007 Cataract Surgery 01/12/09 and 01/26/09 both eyes  Family History: Reviewed history from 04/15/2010 and no changes required. Family History of Arthritis Family History Hypertension Family History of Breast Cancer: Mat Aunt, Sister Family History of Heart Disease: Father, deceased MI  Social History: Reviewed history from 04/15/2010 and no changes required. Retired--[place] [employer] Former Smoker Alcohol use-no Drug use-no Regular exercise-no  Review of Systems      See HPI  Physical Exam  General:  Well-developed,well-nourished,in no acute distress; alert,appropriate  and cooperative throughout examination Psych:  Oriented X3 and normally interactive.     Impression & Recommendations:  Problem # 1:  DIABETES MELLITUS, TYPE II (ICD-250.00)  Pt having episodes of low BS--- meds adjusted The following medications were removed from the medication list:     Glyburide-metformin 5-500 Mg Tabs (Glyburide-metformin) .Marland Kitchen... 1/2 tablet by mouth qd Her updated medication list for this problem includes:    Levemir 100 Unit/ml Soln (Insulin detemir) .Marland Kitchen... 20 u subcutaneously qpm    Diovan Hct 320-25 Mg Tabs (Valsartan-hydrochlorothiazide) .Marland Kitchen... 1 by mouth qd    Aspirin 81 Mg Tbec (Aspirin) .Marland Kitchen... 1 by mouth qd    Metformin Hcl 500 Mg Tabs (Metformin hcl) .Marland Kitchen... 1 by mouth once daily  Labs Reviewed: Creat: 1.0 (06/03/2010)     Last Eye Exam: normal (03/11/2009) Reviewed HgBA1c results: 7.3 (06/03/2010)  6.9 (02/23/2010)  Orders: Prescription Created Electronically [phone])  Problem # 2:  BLOOD IN STOOL (ICD-578.1) f/u GI labs reviewed with pat and her daughter  Problem # 3:  UNSPECIFIED ANEMIA (ICD-285.9)  Her updated medication list for this problem includes:    Slow Fe 160 (50 Fe) Mg Cr-tabs (Ferrous sulfate dried) .Marland Kitchen... 1 by mouth once daily  Hgb: 11.2 (08/06/2010)   Hct: 34.0 (08/06/2010)   Platelets: 359 (08/06/2010) RBC: 3.91 (08/06/2010)   RDW: 14.3 (08/06/2010)   WBC: 7.7 (08/06/2010) MCV: 87.0 (08/06/2010)   MCHC: 32.9 (08/06/2010) Ferritin: 84 (08/06/2010) Iron: 43 (08/06/2010)   TIBC: 441 (08/06/2010)   % Sat: 10 (08/06/2010)  Problem # 4:  UNSPECIFIED ANEMIA (ICD-285.9)  Her updated medication list for this problem includes:    Slow Fe 160 (50 Fe) Mg Cr-tabs (Ferrous sulfate dried) .Marland Kitchen... 1 by mouth once daily  Hgb: 11.2 (08/06/2010)   Hct: 34.0 (08/06/2010)   Platelets: 359 (08/06/2010) RBC: 3.91 (08/06/2010)   RDW: 14.3 (08/06/2010)   WBC: 7.7 (08/06/2010) MCV: 87.0 (08/06/2010)   MCHC: 32.9 (08/06/2010) Ferritin: 84 (08/06/2010) Iron: 43 (08/06/2010)   TIBC: 441 (08/06/2010)   % Sat: 10 (08/06/2010)  Complete Medication List: 1)  Levemir 100 Unit/ml Soln (Insulin detemir) .... 20 u subcutaneously qpm 2)  Fish Oil 1000 Mg Caps (Omega-3 fatty acids) .... By mouth qd 3)  Calcium Carbonate 600 Mg Tabs (Calcium carbonate) .Marland Kitchen.. 1  by mouth qd 4)  Vitamin D 400 Unit Caps (Cholecalciferol) .... Take 1 tablet by mouth once a day 5)  Sotalol Hcl 80 Mg Tabs (Sotalol hcl) .Marland Kitchen.. 1 by mouth bid 6)  Lipitor 20 Mg Tabs (Atorvastatin calcium) .Marland Kitchen.. 1 by mouth at bedtime 7)  Diovan Hct 320-25 Mg Tabs (Valsartan-hydrochlorothiazide) .Marland Kitchen.. 1 by mouth qd 8)  Slow-mag 71.5-119 Mg Tbec (Magnesium cl-calcium carbonate) .Marland Kitchen.. 1 by mouth qd 9)  Aspirin 81 Mg Tbec (Aspirin) .Marland Kitchen.. 1 by mouth qd 10)  Tekturna 300 Mg Tabs (Aliskiren fumarate) .... Take one tablet daily 11)  Warfarin Sodium 5 Mg Tabs (Warfarin sodium) .... Uad 12)  Amlodipine Besylate 10 Mg Tabs (Amlodipine besylate) .Marland Kitchen.. 1 by mouth once daily 13)  Fenofibrate Micronized 134 Mg Caps (Fenofibrate micronized) 14)  One Touch Ultra Mini Strips  .... Accu checks two times a day 15)  Prilosec Otc 20 Mg Tbec (Omeprazole magnesium) .Marland Kitchen.. 1 by mouth once daily 16)  Aleve 220 Mg Tabs (Naproxen sodium) .Marland Kitchen.. 1-2 every 8 hours as needed 17)  Metformin Hcl 500 Mg Tabs (Metformin hcl) .Marland Kitchen.. 1 by mouth once daily 18)  Slow Fe 160 (50 Fe) Mg Cr-tabs (Ferrous sulfate dried) .Marland KitchenMarland KitchenMarland Kitchen  1 by mouth once daily  Patient Instructions: 1)  take blood sugars 3-4 x a day --fasting and 2 hours after a meal 2)  appointment with dietician that is coming here  3)  Please schedule a follow-up appointment in 2 weeks.  Prescriptions: METFORMIN HCL 500 MG TABS (METFORMIN HCL) 1 by mouth once daily  #30 x 2   Entered and Authorized by:   [name] DO   Signed by:   [name] DO on 08/12/2010   Method used:   Electronically to        [facility].* (retail)       [phone]       [place], [place]  [address]       Ph: [phone]       Fax: [phone]   RxID:   [id number] LEVEMIR 100 UNIT/ML SOLN (INSULIN DETEMIR) 20 u subcutaneously qpm  #1 month x 5   Entered and Authorized by:   [name] DO   Signed by:   [name] DO on 08/12/2010   Method used:    Electronically to        [facility].* (retail)       [phone]       [place], [place]  [address]       Ph: [phone]       Fax: [phone]   RxID:   [id number] LEVEMIR 100 UNIT/ML SOLN (INSULIN DETEMIR) 20 u subcutaneously qpm  #1 month x 5   Entered and Authorized by:   [name] DO   Signed by:   [name] DO on 08/12/2010   Method used:   Electronically to        [facility] [phone]* (retail)       [address]       [place], [place]  [address]       Ph: [phone]       Fax: [phone]   RxID:   [id number] METFORMIN HCL 500 MG TABS (METFORMIN HCL) 1 by mouth once daily  #30 x 2   Entered and Authorized by:   [name] DO   Signed by:   [name] DO on 08/12/2010   Method used:   Electronically to        [facility] [phone]* (retail)       [address]       [place], [place]  [address]       Ph: [phone]       Fax: [phone]   RxID:   [id number]    Orders Added: 1)  Est. Patient Level III [id number] 2)  Prescription Created Electronically [phone]

## 2010-08-18 NOTE — Assessment & Plan Note (Signed)
Summary: not feeling well/add per nikki/kn   Vital Signs:  Patient profile:   70 year old female Weight:      177.38 pounds Temp:     98.1 degrees F oral Pulse rate:   82 / minute Pulse rhythm:   regular BP sitting:   130 / 84  (left arm) Cuff size:   regular  Vitals Entered By: Allyn Kenner CMA (August 06, 2010 1:39 PM) CC: Pt here feeling weak. BS low x 4 days. Having blood in stool x 4 days. Difficulty sleeping. Dizziness. CBG Result 217 Comments Pt uses [facility] [address]    History of Present Illness: Pt here c/o blood in stool for last few days and feeling tired.  Pt BS running in 50s at home so she has been eating more sugar.     Problems Prior to Update: 1)  Encounter For Long-term Use of Anticoagulants  (ICD-V58.61) 2)  Special Screening For Malignant Neoplasms Colon  (ICD-V76.51) 3)  Atrial Fibrillation  (ICD-427.31) 4)  Osteoporosis  (ICD-733.00) 5)  Preventive Health Care  (ICD-V70.0) 6)  Coronary Artery Disease  (ICD-414.00) 7)  Uti's, Recurrent  (ICD-599.0) 8)  Hyperlipidemia  (ICD-272.4) 9)  Cardiac Pacemaker in Situ  (ICD-V45.01) 10)  Hypertension  (ICD-401.9) 11)  Diabetes Mellitus, Type II  (ICD-250.00)  Medications Prior to Update: 1)  Levemir 100 Unit/ml Soln (Insulin Detemir) .... Everyday At Bedtime 2)  Fish Oil 1000 Mg Caps (Omega-3 Fatty Acids) .... By Mouth Qd 3)  Calcium Carbonate 600 Mg Tabs (Calcium Carbonate) .Marland Kitchen.. 1 By Mouth Qd 4)  Vitamin D 400 Unit Caps (Cholecalciferol) .... Take 1 Tablet By Mouth Once A Day 5)  Sotalol Hcl 80 Mg Tabs (Sotalol Hcl) .Marland Kitchen.. 1 By Mouth Bid 6)  Lipitor 20 Mg Tabs (Atorvastatin Calcium) .Marland Kitchen.. 1 By Mouth At Bedtime 7)  Diovan Hct 320-25 Mg Tabs (Valsartan-Hydrochlorothiazide) .Marland Kitchen.. 1 By Mouth Qd 8)  Relion Pen Needles 31g X 8 Mm Misc (Insulin Pen Needle) .Marland Kitchen.. 1 As Needed 9)  Slow-Mag 71.5-119 Mg Tbec (Magnesium Cl-Calcium Carbonate) .Marland Kitchen.. 1 By Mouth Qd 10)  Glyburide-Metformin 5-500 Mg Tabs (Glyburide-Metformin)  .... 1/2 Tablet By Mouth Qd 11)  Aspirin 81 Mg Tbec (Aspirin) .Marland Kitchen.. 1 By Mouth Qd 12)  Tekturna 300 Mg Tabs (Aliskiren Fumarate) .... Take One Tablet Daily 13)  Warfarin Sodium 5 Mg Tabs (Warfarin Sodium) .... Uad 14)  Amlodipine Besylate 10 Mg Tabs (Amlodipine Besylate) .Marland Kitchen.. 1 By Mouth Once Daily  Current Medications (verified): 1)  Levemir 100 Unit/ml Soln (Insulin Detemir) .... Everyday At Bedtime 2)  Fish Oil 1000 Mg Caps (Omega-3 Fatty Acids) .... By Mouth Qd 3)  Calcium Carbonate 600 Mg Tabs (Calcium Carbonate) .Marland Kitchen.. 1 By Mouth Qd 4)  Vitamin D 400 Unit Caps (Cholecalciferol) .... Take 1 Tablet By Mouth Once A Day 5)  Sotalol Hcl 80 Mg Tabs (Sotalol Hcl) .Marland Kitchen.. 1 By Mouth Bid 6)  Lipitor 20 Mg Tabs (Atorvastatin Calcium) .Marland Kitchen.. 1 By Mouth At Bedtime 7)  Diovan Hct 320-25 Mg Tabs (Valsartan-Hydrochlorothiazide) .Marland Kitchen.. 1 By Mouth Qd 8)  Slow-Mag 71.5-119 Mg Tbec (Magnesium Cl-Calcium Carbonate) .Marland Kitchen.. 1 By Mouth Qd 9)  Glyburide-Metformin 5-500 Mg Tabs (Glyburide-Metformin) .... 1/2 Tablet By Mouth Qd 10)  Aspirin 81 Mg Tbec (Aspirin) .Marland Kitchen.. 1 By Mouth Qd 11)  Tekturna 300 Mg Tabs (Aliskiren Fumarate) .... Take One Tablet Daily 12)  Warfarin Sodium 5 Mg Tabs (Warfarin Sodium) .... Uad 13)  Amlodipine Besylate 10 Mg Tabs (Amlodipine Besylate) .Marland Kitchen.. 1 By Mouth  Once Daily 14)  Fenofibrate Micronized 134 Mg Caps (Fenofibrate Micronized) 15)  One Touch Ultra Mini Strips .... Accu Checks Two Times A Day  Allergies (verified): No Known Drug Allergies  Past History:  Past medical, surgical, family and social histories (including risk factors) reviewed for relevance to current acute and chronic problems.  Past Medical History: Reviewed history from 04/15/2010 and no changes required. 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 Stent Implant 07/13/2007 Pacemaker 07/13/2007 Cataract Surgery 01/12/09 and 01/26/09 both eyes  Family History: Reviewed history from 04/15/2010 and no changes required. Family History of Arthritis Family History Hypertension Family History of Breast Cancer: Mat Aunt, Sister Family History of Heart Disease: Father, deceased MI  Social History: Reviewed history from 04/15/2010 and no changes required. Retired--[place] [employer] Former Smoker Alcohol use-no Drug use-no Regular exercise-no  Review of Systems      See HPI  Physical Exam  General:  Well-developed,well-nourished,in no acute distress; alert,appropriate and cooperative throughout examination Neck:  No deformities, masses, or tenderness noted. Lungs:  Normal respiratory effort, chest expands symmetrically. Lungs are clear to auscultation, no crackles or wheezes. Heart:  normal rate.   Abdomen:  Bowel sounds positive,abdomen soft and non-tender without masses, organomegaly or hernias noted. Rectal:  no external abnormalities, no hemorrhoids, and stool positive for occult blood.   Extremities:  No clubbing, cyanosis, edema, or deformity noted with normal full range of motion of all joints.   Psych:  normally interactive, good eye contact, not anxious appearing, and not depressed appearing.     Impression & Recommendations:  Problem # 1:  BLOOD IN STOOL (ICD-578.1)  Orders: Venipuncture [id number]) Specimen Handling ([id number]) Gastroenterology Referral (GI)  Problem # 2:  DIABETES MELLITUS, TYPE II (ICD-250.00) BS 241 here---gave pt new glucometer Her updated medication list for this problem includes:    Levemir 100 Unit/ml Soln (Insulin detemir) ..... Everyday at bedtime    Diovan Hct 320-25 Mg Tabs (Valsartan-hydrochlorothiazide) .Marland Kitchen... 1 by mouth qd    Glyburide-metformin 5-500 Mg Tabs (Glyburide-metformin) .Marland Kitchen... 1/2 tablet by mouth qd    Aspirin 81 Mg Tbec (Aspirin) .Marland Kitchen... 1 by mouth  qd  Orders: Capillary Blood Glucose/CBG [id number])  Complete Medication List: 1)  Levemir 100 Unit/ml Soln (Insulin detemir) .... Everyday at bedtime 2)  Fish Oil 1000 Mg Caps (Omega-3 fatty acids) .... By mouth qd 3)  Calcium Carbonate 600 Mg Tabs (Calcium carbonate) .Marland Kitchen.. 1 by mouth qd 4)  Vitamin D 400 Unit Caps (Cholecalciferol) .... Take 1 tablet by mouth once a day 5)  Sotalol Hcl 80 Mg Tabs (Sotalol hcl) .Marland Kitchen.. 1 by mouth bid 6)  Lipitor 20 Mg Tabs (Atorvastatin calcium) .Marland Kitchen.. 1 by mouth at bedtime 7)  Diovan Hct 320-25 Mg Tabs (Valsartan-hydrochlorothiazide) .Marland Kitchen.. 1 by mouth qd 8)  Slow-mag 71.5-119 Mg Tbec (Magnesium cl-calcium carbonate) .Marland Kitchen.. 1 by mouth qd 9)  Glyburide-metformin 5-500 Mg Tabs (Glyburide-metformin) .... 1/2 tablet by mouth qd 10)  Aspirin 81 Mg Tbec (Aspirin) .Marland Kitchen.. 1 by mouth qd 11)  Tekturna 300 Mg Tabs (Aliskiren fumarate) .... Take one tablet daily 12)  Warfarin Sodium 5 Mg Tabs (Warfarin sodium) .... Uad 13)  Amlodipine Besylate 10 Mg Tabs (Amlodipine besylate) .Marland Kitchen.. 1 by mouth once daily 14)  Fenofibrate Micronized 134 Mg Caps (Fenofibrate micronized) 15)  One Touch Ultra Mini Strips  .... Accu checks two times a day  16)  Prilosec Otc 20 Mg Tbec (Omeprazole magnesium) .Marland Kitchen.. 1 by mouth once daily Prescriptions: ONE TOUCH ULTRA MINI STRIPS accu checks two times a day  #60 x 5   Entered and Authorized by:   [name] DO   Signed by:   [name] DO on 08/06/2010   Method used:   Faxed to ...       [facility] [phone]* (retail)       [address]       [place], [place]  [address]       Ph: [phone]       Fax: [phone]   RxID:   [id number]    Orders Added: 1)  Capillary Blood Glucose/CBG [82948] 2)  Venipuncture [id number] 3)  Specimen Handling [id number] 4)  Gastroenterology Referral [GI] 5)  Est. Patient Level III [id number]

## 2010-08-18 NOTE — Progress Notes (Signed)
Summary: INSTRUCT USE OF ONE TOUCH ULTRA  Phone Note Call from Patient Call back at Home Phone [phone]   Caller: Patient Reason for Call: Talk to Nurse Summary of Call: PATIENT CALLING, WAS HERE ON 08-06-2010 & GIVEN THE ONE TOUCH ULTRA [place].  PATIENT STATES SHE IS HAVING TROUBLE GETTING IT TO WORK, AND REQUESTS A CALL FOR SOMEONE TO CALL HER & INSTRUCT HER BY PHONE IN THE CORRECT USE PLEASE. Initial call taken by: Phylliss Bob [facility],  August 09, 2010 9:12 AM  Follow-up for Phone Call        Tried to walk thru with instruction with Pt however Pt still receiving error message. Pt to check with daughter and come in to office to be instructed on how to work glucometer.Marland KitchenMarland KitchenFelecia Deloach CMA  August 09, 2010 10:56 AM     Prescriptions: ONE TOUCH ULTRA MINI STRIPS accu checks two times a day  #60 x 5   Entered by:   Rolla Flatten CMA   Authorized by:   [name] DO   Signed by:   Rolla Flatten CMA on 08/09/2010   Method used:   Re-Faxed to ...       [facility].* (retail)       [phone]       [place], [place]  [address]       Ph: [phone]       Fax: [phone]   RxID:   [id number]

## 2010-08-18 NOTE — Progress Notes (Signed)
Summary: Hypoglycemia and possible blacking out  Phone Note Call from Patient   Caller: Daughter--[phone] Summary of Call: I spoke with patient daughter who called the office about her mothers sugar readings. Low AM readings have been a problem for the pt and her meter was thought to be ineffective so the patient was previously given a new one from the office. Per the daughter, the patient called her this AM stating that her meter was reading 29. She had the patient check it again and said it was 77. She was not using the old meter. I advised that the patient have something with sugar (i.e. orange juice, some candy, or a soda with extra sugar). Patient daughter also noted that per the patient there is a possibility that she blacked out this AM and has been dizzy and wobbly.  With the possibility of blacking out, I advised the daughter take her to ER or Urgent Care since the patient has possibly blacked out. She wants her mother seen now, but was advised that I cannot guarantee that. I also offered appt this afternoon with Dr. Etter Sjogren to possibly adjust patient meds (she does take insulin at night). Patient daughter states that she will call her mother and tell her to eat/drink some of the above and discuss. Initial call taken by: Ernestene Mention CMA,  August 12, 2010 8:30 AM  Follow-up for Phone Call        pt needs appointment today Follow-up by: [name] DO,  August 12, 2010 8:47 AM  Additional Follow-up for Phone Call Additional follow up Details #1::        pt coming today at 11:30 Additional Follow-up by: Aron Baba CMA ([place]),  August 12, 2010 9:22 AM

## 2010-08-18 NOTE — Letter (Signed)
Summary: Primary Care Consult Scheduled Letter  [facility]   [place], [place] [address]   Phone: [phone]  Fax: [phone]      08/11/2010 MRN: [id number]  [facility] [place], [place]  [address]  [place]    Dear Ms. Floresca,    We have scheduled an appointment for you.  At the recommendation of Dr. [name], we have scheduled you a consult with Dr. Erskine Emery of [facility] on 08-18-2010 at 9:15am.  Their address is [address] floor, [place] [address]. The office phone number is [phone].  If this appointment day and time is not convenient for you, please feel free to call the office of the doctor you are being referred to at the number listed above and reschedule the appointment.    It is important for you to keep your scheduled appointments. We are here to make sure you are given good patient care.   Thank you,    Renee, Patient Care Coordinator [facility]

## 2010-08-18 NOTE — Progress Notes (Signed)
Summary: Question about checking blood sugar  Phone Note Call from Patient   Caller: Patient Call For: [name] DO Summary of Call: mssg from patient and she stated she was seen yesterday and was told to check her Blood sugars 4 times a day. She wanted to know if there were particular times when she should check them, she already checks it in the moning before breakfast and then before lunch, wants to know when to check them again....c/b # [id number]...Marland Kitchen please advise..... Aron Baba CMA Deborra Medina)  August 13, 2010 11:36 AM   Follow-up for Phone Call        fasting and 2hours after meals Follow-up by: [name] DO,  August 13, 2010 11:53 AM  Additional Follow-up for Phone Call Additional follow up Details #1::        pt aware of the above..... Additional Follow-up by: Aron Baba CMA ([place]),  August 13, 2010 1:16 PM

## 2010-08-24 NOTE — Progress Notes (Signed)
Summary: Resend script  ---- Converted from flag ---- ---- 08/18/2010 10:29 AM, Webb Laws wrote: Needs her script sent to [facility] ------------------------------ Resent rx to [facility]

## 2010-08-24 NOTE — Assessment & Plan Note (Addendum)
Summary: BLOOD IN STOOLS/SCHED W-RENEE/YF   History of Present Illness Visit Type: Follow-up Consult Primary GI MD: Erskine Emery MD [facility] Primary Provider: [name], DO Requesting Provider: [name], DO Chief Complaint: Patient complains of BRBPR every time she has a BM. She states that sometimes she even sees clots. She Denies any abdominal pain or other GI complaints.  History of Present Illness:    Yolanda Huffman is a 70 year old white female referred at the request of Dr. Etter Sjogren  for evaluation of rectal bleeding. Over the past month she has noted rectal bleeding consisting of small amounts of bright red blood on the toilet tissue following a bowel movement. At times she apparently has passed a few clots as well. She denies change of bowel habits, abdominal, or rectal pain. In November, 2011 she underwent screening colonoscopy, where a hyperplastic polyp was seen. There was extensive diverticulosis. Hemoglobin in February, 2012 was 11.2. 6 months, earlier it was 13.1.   She takes Coumadin and is currently taking iron.   GI Review of Systems      Denies abdominal pain, acid reflux, belching, bloating, chest pain, dysphagia with liquids, dysphagia with solids, heartburn, loss of appetite, nausea, vomiting, vomiting blood, weight loss, and  weight gain.      Reports rectal bleeding.     Denies anal fissure, black tarry stools, change in bowel habit, constipation, diarrhea, diverticulosis, fecal incontinence, heme positive stool, hemorrhoids, irritable bowel syndrome, jaundice, light color stool, liver problems, and  rectal pain. Visit Type:  Follow-up Consult Referring Provider:  [name], DO Primary Care Provider:  [name], DO  Chief Complaint:  Patient complains of BRBPR every time she has a BM. She states that sometimes she even sees clots. She Denies any abdominal pain or other GI complaints. .  History of Present Illness: B    Current Medications (verified): 1)   Levemir 100 Unit/ml Soln (Insulin Detemir) .... 20 U Subcutaneously Qpm 2)  Fish Oil 1000 Mg Caps (Omega-3 Fatty Acids) .... By Mouth Qd 3)  Calcium Carbonate 600 Mg Tabs (Calcium Carbonate) .Marland Kitchen.. 1 By Mouth Qd 4)  Vitamin D 400 Unit Caps (Cholecalciferol) .... Take 1 Tablet By Mouth Once A Day 5)  Sotalol Hcl 80 Mg Tabs (Sotalol Hcl) .Marland Kitchen.. 1 By Mouth Bid 6)  Lipitor 20 Mg Tabs (Atorvastatin Calcium) .Marland Kitchen.. 1 By Mouth At Bedtime 7)  Diovan Hct 320-25 Mg Tabs (Valsartan-Hydrochlorothiazide) .Marland Kitchen.. 1 By Mouth Qd 8)  Slow-Mag 71.5-119 Mg Tbec (Magnesium Cl-Calcium Carbonate) .Marland Kitchen.. 1 By Mouth Qd 9)  Aspirin 81 Mg Tbec (Aspirin) .Marland Kitchen.. 1 By Mouth Qd 10)  Tekturna 300 Mg Tabs (Aliskiren Fumarate) .... Take One Tablet Daily 11)  Warfarin Sodium 5 Mg Tabs (Warfarin Sodium) .... Uad 12)  Amlodipine Besylate 10 Mg Tabs (Amlodipine Besylate) .Marland Kitchen.. 1 By Mouth Once Daily 13)  Fenofibrate Micronized 134 Mg Caps (Fenofibrate Micronized) 14)  One Touch Ultra Mini Strips .... Accu Checks Two Times A Day 15)  Prilosec Otc 20 Mg Tbec (Omeprazole Magnesium) .Marland Kitchen.. 1 By Mouth Once Daily 16)  Aleve 220 Mg Tabs (Naproxen Sodium) .Marland Kitchen.. 1-2 Every 8 Hours As Needed 17)  Metformin Hcl 500 Mg Tabs (Metformin Hcl) .Marland Kitchen.. 1 By Mouth Once Daily 18)  Slow Fe 160 (50 Fe) Mg Cr-Tabs (Ferrous Sulfate Dried) .Marland Kitchen.. 1 By Mouth Once Daily  Allergies (verified): No Known Drug Allergies  Past History:  Past Medical History: Reviewed history from 04/15/2010 and no changes required. 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 Stent Implant 07/13/2007 Pacemaker 07/13/2007 Cataract Surgery 01/12/09 and 01/26/09 both eyes  Family History: Reviewed history from 04/15/2010 and no changes required. Family History of Arthritis Family History Hypertension Family  History of Breast Cancer: Mat Aunt, Sister Family History of Heart Disease: Father, deceased MI  Social History: Reviewed history from 04/15/2010 and no changes required. Retired--[employer] Wid[employer] Former Smoker Alcohol use-no Drug use-no Regular exercise-no  Review of Systems  The patient denies allergy/sinus, anemia, anxiety-new, arthritis/joint pain, back pain, blood in urine, breast changes/lumps, change in vision, confusion, cough, coughing up blood, depression-new, fainting, fatigue, fever, headaches-new, hearing problems, heart murmur, heart rhythm changes, itching, menstrual pain, muscle pains/cramps, night sweats, nosebleeds, pregnancy symptoms, shortness of breath, skin rash, sleeping problems, sore throat, swelling of feet/legs, swollen lymph glands, thirst - excessive , urination - excessive , urination changes/pain, urine leakage, vision changes, and voice change.    Vital Signs:  Patient profile:   70 year old female Height:      62.5 inches Weight:      176 pounds BMI:     31.79 Pulse rate:   78 / minute Pulse rhythm:   regular BP sitting:   124 / 64  (left arm) Cuff size:   regular  Vitals Entered By: Bernita Buffy CMA Deborra Medina) (August 18, 2010 9:26 AM)  Physical Exam  Additional Exam:   On physical exam, she is a well-developed, well-nourished, female.  Physical Exam: General:   WDWN HEENT:   anicteric.  No pharyngeal abnormalities Neck:   No masses, thyroidmegaly Nodes:   No cervical, axillary, inguinal adenopathy Chest:    Clear to auscultation Cardiac:   No murmurs, gallops, rubs Abdomen:   BS active.  No abd masses, tenderness, organomegaly;  There is a reducible umbilical hernia Rectal:   No external masses Extremities:   No cyanosis, clubbing, edema Skeletal:   No deformities Neuro:   Alert, oriented x3.  No focal abnormalities    Impression & Recommendations:  Problem # 1:  BLOOD IN STOOL (ICD-578.1)  This most likely is  due to hemorrhoidal bleeding.  Recommendations #1 Anusol HC suppositories  Problem # 2:  UNSPECIFIED ANEMIA (ICD-285.9)  There has been a 2 g drop in hemoglobin over the past 2 months. I think it is unlikely to be due to rectal bleeding.  Recommendations #1 followup hemoglobin in one month  Problem # 3:  ATRIAL FIBRILLATION (ICD-427.31) Assessment: Comment Only  Problem # 4:  LONG-TERM (CURRENT) USE OF ANTICOAGULANTS (ICD-V58.61) Assessment: Comment Only  Patient Instructions: 1)  Copy sent to : [name], DO 2)  You will have a CBC in 1 month  3)  [place] schedule a follow up appointment with Dr Deatra Ina 4)  The medication list was reviewed and reconciled.  All changed / newly prescribed medications were explained.  A complete medication list was provided to the patient / caregiver. Prescriptions: ANUSOL-HC 25 MG SUPP (HYDROCORTISONE ACETATE) take one  PR each bedtime  #10 x 1   Entered by:   Genella Mech CMA ([place])   Authorized by:   Inda Castle MD   Signed by:   Genella Mech CMA ([place]) on 08/18/2010   Method used:   Electronically to        [facility]. [id number]* (retail)       [address]  [place], [place]  [address]       Ph: [phone]       Fax: [phone]   RxID:   [id number] ANUSOL-HC 25 MG SUPP (HYDROCORTISONE ACETATE) take one  PR each bedtime  #10 x 1   Entered and Authorized by:   Inda Castle MD   Signed by:   Inda Castle MD on 08/18/2010   Method used:   Electronically to        [facility].* (retail)       [phone]       [place], [place]  [address]       Ph: [phone]       Fax: [phone]   RxID:   [id number]

## 2010-08-26 ENCOUNTER — Other Ambulatory Visit: Payer: Self-pay | Admitting: Family Medicine

## 2010-08-26 ENCOUNTER — Ambulatory Visit (INDEPENDENT_AMBULATORY_CARE_PROVIDER_SITE_OTHER): Payer: Medicare Other | Admitting: Family Medicine

## 2010-08-26 ENCOUNTER — Telehealth (INDEPENDENT_AMBULATORY_CARE_PROVIDER_SITE_OTHER): Payer: Self-pay | Admitting: *Deleted

## 2010-08-26 ENCOUNTER — Encounter: Payer: Self-pay | Admitting: Family Medicine

## 2010-08-26 DIAGNOSIS — E119 Type 2 diabetes mellitus without complications: Secondary | ICD-10-CM

## 2010-08-26 DIAGNOSIS — Z7901 Long term (current) use of anticoagulants: Secondary | ICD-10-CM

## 2010-08-26 DIAGNOSIS — I4891 Unspecified atrial fibrillation: Secondary | ICD-10-CM

## 2010-08-26 DIAGNOSIS — D649 Anemia, unspecified: Secondary | ICD-10-CM

## 2010-08-26 DIAGNOSIS — I2510 Atherosclerotic heart disease of native coronary artery without angina pectoris: Secondary | ICD-10-CM

## 2010-08-26 DIAGNOSIS — I10 Essential (primary) hypertension: Secondary | ICD-10-CM

## 2010-08-26 DIAGNOSIS — M810 Age-related osteoporosis without current pathological fracture: Secondary | ICD-10-CM

## 2010-08-26 DIAGNOSIS — E785 Hyperlipidemia, unspecified: Secondary | ICD-10-CM

## 2010-08-26 LAB — LIPID PANEL
Cholesterol: 107 mg/dL (ref 0–200)
VLDL: 22 mg/dL (ref 0.0–40.0)

## 2010-08-26 LAB — BASIC METABOLIC PANEL
BUN: 49 mg/dL — ABNORMAL HIGH (ref 6–23)
Chloride: 93 mEq/L — ABNORMAL LOW (ref 96–112)
Glucose, Bld: 65 mg/dL — ABNORMAL LOW (ref 70–99)
Potassium: 4.8 mEq/L (ref 3.5–5.1)

## 2010-08-26 LAB — HEPATIC FUNCTION PANEL: Total Bilirubin: 0.8 mg/dL (ref 0.3–1.2)

## 2010-08-26 LAB — H. PYLORI ANTIBODY, IGG: H Pylori IgG: NEGATIVE

## 2010-08-26 LAB — MICROALBUMIN / CREATININE URINE RATIO
Microalb Creat Ratio: 11.3 mg/g (ref 0.0–30.0)
Microalb, Ur: 9.9 mg/dL — ABNORMAL HIGH (ref 0.0–1.9)

## 2010-08-26 LAB — CBC WITH DIFFERENTIAL/PLATELET
Basophils Absolute: 0.1 10*3/uL (ref 0.0–0.1)
HCT: 25.8 % — ABNORMAL LOW (ref 36.0–46.0)
Lymphs Abs: 1.1 10*3/uL (ref 0.7–4.0)
MCHC: 34.1 g/dL (ref 30.0–36.0)
MCV: 87.7 fl (ref 78.0–100.0)
Monocytes Absolute: 0.5 10*3/uL (ref 0.1–1.0)
Platelets: 413 10*3/uL — ABNORMAL HIGH (ref 150.0–400.0)
RDW: 14.9 % — ABNORMAL HIGH (ref 11.5–14.6)

## 2010-08-27 ENCOUNTER — Encounter: Payer: Self-pay | Admitting: Family Medicine

## 2010-08-27 ENCOUNTER — Ambulatory Visit (INDEPENDENT_AMBULATORY_CARE_PROVIDER_SITE_OTHER): Payer: Medicare Other

## 2010-08-27 DIAGNOSIS — Z7901 Long term (current) use of anticoagulants: Secondary | ICD-10-CM

## 2010-08-27 DIAGNOSIS — I4891 Unspecified atrial fibrillation: Secondary | ICD-10-CM

## 2010-08-27 LAB — CONVERTED CEMR LAB: Prothrombin Time: 88.4 s — ABNORMAL HIGH (ref 11.6–15.2)

## 2010-08-28 ENCOUNTER — Telehealth: Payer: Self-pay | Admitting: Family Medicine

## 2010-08-30 ENCOUNTER — Encounter: Payer: Self-pay | Admitting: Family Medicine

## 2010-08-30 ENCOUNTER — Ambulatory Visit: Payer: Medicare Other

## 2010-08-30 ENCOUNTER — Other Ambulatory Visit (INDEPENDENT_AMBULATORY_CARE_PROVIDER_SITE_OTHER): Payer: Medicare Other

## 2010-08-30 ENCOUNTER — Encounter: Payer: Self-pay | Admitting: Gastroenterology

## 2010-08-30 ENCOUNTER — Encounter (INDEPENDENT_AMBULATORY_CARE_PROVIDER_SITE_OTHER): Payer: Self-pay | Admitting: *Deleted

## 2010-08-30 DIAGNOSIS — K921 Melena: Secondary | ICD-10-CM

## 2010-08-30 DIAGNOSIS — D649 Anemia, unspecified: Secondary | ICD-10-CM

## 2010-08-30 DIAGNOSIS — I4891 Unspecified atrial fibrillation: Secondary | ICD-10-CM

## 2010-08-30 LAB — CONVERTED CEMR LAB: INR: 2.7

## 2010-08-31 LAB — CONVERTED CEMR LAB
Basophils Absolute: 0 10*3/uL (ref 0.0–0.1)
CO2: 24 meq/L (ref 19–32)
Calcium: 9.8 mg/dL (ref 8.4–10.5)
Eosinophils Relative: 1 % (ref 0–5)
HCT: 27.7 % — ABNORMAL LOW (ref 36.0–46.0)
Lymphocytes Relative: 17 % (ref 12–46)
Neutro Abs: 5.8 10*3/uL (ref 1.7–7.7)
Neutrophils Relative %: 74 % (ref 43–77)
Platelets: 465 10*3/uL — ABNORMAL HIGH (ref 150–400)
Potassium: 4.8 meq/L (ref 3.5–5.3)
RDW: 15.3 % (ref 11.5–15.5)
Sodium: 130 meq/L — ABNORMAL LOW (ref 135–145)

## 2010-09-01 ENCOUNTER — Encounter: Payer: Medicare Other | Admitting: Gastroenterology

## 2010-09-01 ENCOUNTER — Telehealth: Payer: Self-pay | Admitting: Gastroenterology

## 2010-09-01 ENCOUNTER — Ambulatory Visit (HOSPITAL_COMMUNITY)
Admission: RE | Admit: 2010-09-01 | Discharge: 2010-09-01 | Disposition: A | Discharge status: Home / Self Care | Payer: Medicare Other | Source: Ambulatory Visit | Attending: Gastroenterology | Admitting: Gastroenterology

## 2010-09-01 ENCOUNTER — Encounter: Payer: Self-pay | Admitting: Gastroenterology

## 2010-09-01 DIAGNOSIS — D649 Anemia, unspecified: Secondary | ICD-10-CM

## 2010-09-01 DIAGNOSIS — K263 Acute duodenal ulcer without hemorrhage or perforation: Secondary | ICD-10-CM

## 2010-09-01 DIAGNOSIS — K269 Duodenal ulcer, unspecified as acute or chronic, without hemorrhage or perforation: Secondary | ICD-10-CM | POA: Insufficient documentation

## 2010-09-02 ENCOUNTER — Telehealth: Payer: Self-pay | Admitting: Family Medicine

## 2010-09-02 NOTE — Assessment & Plan Note (Signed)
Summary: 2 week follow up./kb   Vital Signs:  Patient profile:   70 year old female Weight:      176.0 pounds Pulse rate:   80 / minute Pulse rhythm:   regular BP sitting:   118 / 68  (left arm) Cuff size:   regular  Vitals Entered By: Aron Baba CMA Deborra Medina) (August 26, 2010 10:18 AM) CC: 2 week DM f/u    History of Present Illness:  Type 1 diabetes mellitus follow-up      This is a 70 year old woman who presents with Type 2 diabetes mellitus follow-up.  The patient denies polyuria, polydipsia, blurred vision, self managed hypoglycemia, hypoglycemia requiring help, weight loss, weight gain, and numbness of extremities.  The patient denies the following symptoms: neuropathic pain, chest pain, vomiting, orthostatic symptoms, poor wound healing, intermittent claudication, vision loss, and foot ulcer.  Since the last visit the patient reports good dietary compliance, compliance with medications, and monitoring blood glucose.  The patient has been measuring capillary blood glucose before breakfast, after breakfast, after lunch, and at bedtime.  Since the last visit, the patient reports having had eye care by an ophthalmologist.  Home bld sugars reviewed---scanned in.   Current Medications (verified): 1)  Levemir 100 Unit/ml Soln (Insulin Detemir) .Marland Kitchen.. 18 U Subcutaneously Qpm 2)  Fish Oil 1000 Mg Caps (Omega-3 Fatty Acids) .... By Mouth Qd 3)  Calcium Carbonate 600 Mg Tabs (Calcium Carbonate) .Marland Kitchen.. 1 By Mouth Qd 4)  Vitamin D 400 Unit Caps (Cholecalciferol) .... Take 1 Tablet By Mouth Once A Day 5)  Sotalol Hcl 80 Mg Tabs (Sotalol Hcl) .Marland Kitchen.. 1 By Mouth Bid 6)  Lipitor 20 Mg Tabs (Atorvastatin Calcium) .Marland Kitchen.. 1 By Mouth At Bedtime 7)  Diovan Hct 320-25 Mg Tabs (Valsartan-Hydrochlorothiazide) .Marland Kitchen.. 1 By Mouth Qd 8)  Slow-Mag 71.5-119 Mg Tbec (Magnesium Cl-Calcium Carbonate) .Marland Kitchen.. 1 By Mouth Qd 9)  Aspirin 81 Mg Tbec (Aspirin) .Marland Kitchen.. 1 By Mouth Qd 10)  Tekturna 300 Mg Tabs (Aliskiren Fumarate) ....  Take One Tablet Daily 11)  Warfarin Sodium 5 Mg Tabs (Warfarin Sodium) .... Uad 12)  Amlodipine Besylate 10 Mg Tabs (Amlodipine Besylate) .Marland Kitchen.. 1 By Mouth Once Daily 13)  Fenofibrate Micronized 134 Mg Caps (Fenofibrate Micronized) 14)  One Touch Ultra Mini Strips .... Accu Checks Two Times A Day 15)  Prilosec Otc 20 Mg Tbec (Omeprazole Magnesium) .Marland Kitchen.. 1 By Mouth Once Daily 16)  Aleve 220 Mg Tabs (Naproxen Sodium) .Marland Kitchen.. 1-2 Every 8 Hours As Needed 17)  Metformin Hcl 500 Mg Tabs (Metformin Hcl) .Marland Kitchen.. 1 By Mouth Two Times A Day 18)  Slow Fe 160 (50 Fe) Mg Cr-Tabs (Ferrous Sulfate Dried) .Marland Kitchen.. 1 By Mouth Once Daily 19)  Anusol-Hc 25 Mg Supp (Hydrocortisone Acetate) .... Take One  Pr Each Bedtime  Allergies (verified): No Known Drug Allergies  Past History:  Past medical, surgical, family and social histories (including risk factors) reviewed for relevance to current acute and chronic problems.  Past Medical History: Reviewed history from 04/15/2010 and no changes required. 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 Stent Implant 07/13/2007 Pacemaker 07/13/2007 Cataract Surgery 01/12/09 and 01/26/09 both eyes  Family History: Reviewed history from 04/15/2010 and no changes required. Family History of Arthritis Family History Hypertension Family History of Breast Cancer: Mat Aunt, Sister Family History of Heart Disease: Father, deceased MI  Social History: Reviewed  history from 04/15/2010 and no changes required. Retired--[employer] Former Smoker Alcohol use-no Drug use-no Regular exercise-no  Review of Systems      See HPI  Physical Exam  General:  Well-developed,well-nourished,in no acute distress; alert,appropriate and cooperative throughout examination Neck:  No deformities,  masses, or tenderness noted. Lungs:  Normal respiratory effort, chest expands symmetrically. Lungs are clear to auscultation, no crackles or wheezes. Heart:  normal rate.     Impression & Recommendations:  Problem # 1:  DIABETES MELLITUS, TYPE II (ICD-250.00)  Her updated medication list for this problem includes:    Levemir 100 Unit/ml Soln (Insulin detemir) .Marland KitchenMarland KitchenMarland KitchenMarland Kitchen 18 u subcutaneously qpm    Diovan Hct 320-25 Mg Tabs (Valsartan-hydrochlorothiazide) .Marland Kitchen... 1 by mouth qd    Aspirin 81 Mg Tbec (Aspirin) .Marland Kitchen... 1 by mouth qd    Metformin Hcl 500 Mg Tabs (Metformin hcl) .Marland Kitchen... 1 by mouth two times a day  Problem # 2:  HYPERLIPIDEMIA (ICD-272.4)  Her updated medication list for this problem includes:    Lipitor 20 Mg Tabs (Atorvastatin calcium) .Marland Kitchen... 1 by mouth at bedtime    Fenofibrate Micronized 134 Mg Caps (Fenofibrate micronized)  Orders: Venipuncture [id number]) TLB-Lipid Panel (80061-LIPID) TLB-BMP (Basic Metabolic Panel-BMET) (99991111) TLB-CBC Platelet - w/Differential (85025-CBCD) TLB-Hepatic/Liver Function Pnl (80076-HEPATIC) TLB-H. Pylori Abs(Helicobacter Pylori) (A999333) TLB-Microalbumin/Creat Ratio, Urine (82043-MALB) TLB-PT (Protime) (85610-PTP)  Problem # 3:  ATRIAL FIBRILLATION (ICD-427.31)  Her updated medication list for this problem includes:    Sotalol Hcl 80 Mg Tabs (Sotalol hcl) .Marland Kitchen... 1 by mouth bid    Aspirin 81 Mg Tbec (Aspirin) .Marland Kitchen... 1 by mouth qd    Warfarin Sodium 5 Mg Tabs (Warfarin sodium) ..... Uad    Amlodipine Besylate 10 Mg Tabs (Amlodipine besylate) .Marland Kitchen... 1 by mouth once daily  Orders: Venipuncture [id number]) TLB-Lipid Panel (80061-LIPID) TLB-BMP (Basic Metabolic Panel-BMET) (99991111) TLB-CBC Platelet - w/Differential (85025-CBCD) TLB-Hepatic/Liver Function Pnl (80076-HEPATIC) TLB-H. Pylori Abs(Helicobacter Pylori) (A999333) TLB-Microalbumin/Creat Ratio, Urine (82043-MALB) TLB-PT (Protime) (85610-PTP) Specimen Handling  (99000)  Reviewed the following: PT: 19.3 (06/03/2010)   INR: 1.8 ratio (06/03/2010) Next Protime: 4 weeks (dated on 04/20/2010)  Problem # 4:  HYPERTENSION (ICD-401.9)  Her updated medication list for this problem includes:    Sotalol Hcl 80 Mg Tabs (Sotalol hcl) .Marland Kitchen... 1 by mouth bid    Diovan Hct 320-25 Mg Tabs (Valsartan-hydrochlorothiazide) .Marland Kitchen... 1 by mouth qd    Tekturna 300 Mg Tabs (Aliskiren fumarate) .Marland Kitchen... Take one tablet daily    Amlodipine Besylate 10 Mg Tabs (Amlodipine besylate) .Marland Kitchen... 1 by mouth once daily  Orders: Venipuncture [id number]) TLB-Lipid Panel (80061-LIPID) TLB-BMP (Basic Metabolic Panel-BMET) (99991111) TLB-CBC Platelet - w/Differential (85025-CBCD) TLB-Hepatic/Liver Function Pnl (80076-HEPATIC) TLB-H. Pylori Abs(Helicobacter Pylori) (A999333) TLB-Microalbumin/Creat Ratio, Urine (82043-MALB) TLB-PT (Protime) (85610-PTP)  BP today: 118/68 Prior BP: 124/64 (08/18/2010)  Labs Reviewed: K+: 4.7 (06/03/2010) Creat: : 1.0 (06/03/2010)   Chol: 171 (06/03/2010)   HDL: 47.20 (06/03/2010)   LDL: 95 (02/23/2010)   TG: 203.0 (06/03/2010)  Complete Medication List: 1)  Levemir 100 Unit/ml Soln (Insulin detemir) .Marland Kitchen.. 18 u subcutaneously qpm 2)  Fish Oil 1000 Mg Caps (Omega-3 fatty acids) .... By mouth qd 3)  Calcium Carbonate 600 Mg Tabs (Calcium carbonate) .Marland Kitchen.. 1 by mouth qd 4)  Vitamin D 400 Unit Caps (Cholecalciferol) .... Take 1 tablet by mouth once a day 5)  Sotalol Hcl 80 Mg Tabs (Sotalol hcl) .Marland Kitchen.. 1 by mouth bid 6)  Lipitor 20 Mg Tabs (Atorvastatin calcium) .Marland Kitchen.. 1 by mouth at  bedtime 7)  Diovan Hct 320-25 Mg Tabs (Valsartan-hydrochlorothiazide) .Marland Kitchen.. 1 by mouth qd 8)  Slow-mag 71.5-119 Mg Tbec (Magnesium cl-calcium carbonate) .Marland Kitchen.. 1 by mouth qd 9)  Aspirin 81 Mg Tbec (Aspirin) .Marland Kitchen.. 1 by mouth qd 10)  Tekturna 300 Mg Tabs (Aliskiren fumarate) .... Take one tablet daily 11)  Warfarin Sodium 5 Mg Tabs (Warfarin sodium) .... Uad 12)  Amlodipine Besylate  10 Mg Tabs (Amlodipine besylate) .Marland Kitchen.. 1 by mouth once daily 13)  Fenofibrate Micronized 134 Mg Caps (Fenofibrate micronized) 14)  One Touch Ultra Mini Strips  .... Accu checks two times a day 15)  Prilosec Otc 20 Mg Tbec (Omeprazole magnesium) .Marland Kitchen.. 1 by mouth once daily 16)  Aleve 220 Mg Tabs (Naproxen sodium) .Marland Kitchen.. 1-2 every 8 hours as needed 17)  Metformin Hcl 500 Mg Tabs (Metformin hcl) .Marland Kitchen.. 1 by mouth two times a day 18)  Slow Fe 160 (50 Fe) Mg Cr-tabs (Ferrous sulfate dried) .Marland Kitchen.. 1 by mouth once daily 19)  Anusol-hc 25 Mg Supp (Hydrocortisone acetate) .... Take one  pr each bedtime  Patient Instructions: 1)  Decrease Levemir 18 u in pm  2)  increase glucophage 500 mg 1 by mouth two times a day  3)  Please schedule a follow-up appointment in 3 months .  Prescriptions: METFORMIN HCL 500 MG TABS (METFORMIN HCL) 1 by mouth two times a day  #60 x 2   Entered and Authorized by:   [name] DO   Signed by:   [name] DO on 08/26/2010   Method used:   Electronically to        [facility]. [phone]* (retail)       [place], [place]  [address]       Ph: [phone]       Fax: [phone]   RxID:   [id number]    Orders Added: 1)  Venipuncture [id number] 2)  TLB-Lipid Panel [80061-LIPID] 3)  TLB-BMP (Basic Metabolic Panel-BMET) [id number] 4)  TLB-CBC Platelet - w/Differential [85025-CBCD] 5)  TLB-Hepatic/Liver Function Pnl [80076-HEPATIC] 6)  TLB-H. Pylori Abs(Helicobacter Pylori) [id number] 7)  TLB-Microalbumin/Creat Ratio, Urine [82043-MALB] 8)  TLB-PT (Protime) [85610-PTP] 9)  Specimen Handling [99000] 10)  Est. Patient Level IV [id number]

## 2010-09-02 NOTE — Medication Information (Signed)
Summary: pt/inr/ lab/cbs   PCP: [name], DO          Comments: patient here for pt/inr unable to read through machine results read >8.0 .Marland KitchenMarland KitchenMarland KitchenMalachi Bonds CMA  August 27, 2010 2:31 PM  says she held medication last night as directed....Marland KitchenMarland KitchenMalachi Bonds CMA  August 27, 2010 2:31 PM   Pt needs to con't to hold med until further directed.  yrlowne  08/27/2010  4pm   recieved call from [facility] INR was greater than 10.0.....Marland KitchenMarland KitchenMalachi Bonds CMA  August 27, 2010 4:48 PM   per Dr. Etter Sjogren call in vitamin K 5mg  1/2 tablet today no coumadin until directed and recheck on Mon. instructed patient any signs bleeding needs to go to emergency room stated she is no longer having rectal bleeding........Marland KitchenMalachi Bonds CMA  August 27, 2010 4:59 PM   Current Medications (verified): 1)  Levemir 100 Unit/ml Soln (Insulin Detemir) .Marland Kitchen.. 18 U Subcutaneously Qpm 2)  Fish Oil 1000 Mg Caps (Omega-3 Fatty Acids) .... By Mouth Qd 3)  Calcium Carbonate 600 Mg Tabs (Calcium Carbonate) .Marland Kitchen.. 1 By Mouth Qd 4)  Vitamin D 400 Unit Caps (Cholecalciferol) .... Take 1 Tablet By Mouth Once A Day 5)  Sotalol Hcl 80 Mg Tabs (Sotalol Hcl) .Marland Kitchen.. 1 By Mouth Bid 6)  Lipitor 20 Mg Tabs (Atorvastatin Calcium) .Marland Kitchen.. 1 By Mouth At Bedtime 7)  Diovan Hct 320-25 Mg Tabs (Valsartan-Hydrochlorothiazide) .Marland Kitchen.. 1 By Mouth Qd 8)  Slow-Mag 71.5-119 Mg Tbec (Magnesium Cl-Calcium Carbonate) .Marland Kitchen.. 1 By Mouth Qd 9)  Aspirin 81 Mg Tbec (Aspirin) .Marland Kitchen.. 1 By Mouth Qd 10)  Tekturna 300 Mg Tabs (Aliskiren Fumarate) .... Take One Tablet Daily 11)  Warfarin Sodium 5 Mg Tabs (Warfarin Sodium) .... Uad 12)  Amlodipine Besylate 10 Mg Tabs (Amlodipine Besylate) .Marland Kitchen.. 1 By Mouth Once Daily 13)  Fenofibrate Micronized 134 Mg Caps (Fenofibrate Micronized) 14)  One Touch Ultra Mini Strips .... Accu Checks Two Times A Day 15)  Prilosec Otc 20 Mg Tbec (Omeprazole Magnesium) .Marland Kitchen.. 1 By Mouth Once Daily 16)  Aleve 220 Mg Tabs (Naproxen Sodium) .Marland Kitchen.. 1-2 Every 8 Hours As  Needed 17)  Metformin Hcl 500 Mg Tabs (Metformin Hcl) .Marland Kitchen.. 1 By Mouth Two Times A Day 18)  Slow Fe 160 (50 Fe) Mg Cr-Tabs (Ferrous Sulfate Dried) .Marland Kitchen.. 1 By Mouth Once Daily 19)  Anusol-Hc 25 Mg Supp (Hydrocortisone Acetate) .... Take One  Pr Each Bedtime 20)  Humulin N Pen 100 Unit/ml Susp (Insulin Isophane Human) .Marland Kitchen.. 10 Units Before Lunch and 10 Units Before Dinner  Allergies (verified): No Known Drug Allergies  Anticoagulation Management History:      Positive risk factors for bleeding include an age of 52 years or older, history of GI bleeding, and presence of serious comorbidities.  The bleeding index is 'high risk'.  Positive CHADS2 values include History of HTN and History of Diabetes.  Negative CHADS2 values include Age > 14 years old.  Her last INR was 1.8 ratio.    Anticoagulation Management Assessment/Plan:      The patient's current anticoagulation dose is Warfarin sodium 5 mg tabs: UAD.  The next INR is due 4 weeks.        Prescriptions: VITAMIN K 5MG  1/2 tablet today  #1 x 0   Entered by:   Malachi Bonds CMA   Authorized by:   [name] DO   Signed by:   Malachi Bonds CMA on 08/27/2010   Method used:   Telephoned to .Marland KitchenMarland Kitchen  [facility] [address] (retail)       [address]       [place], [place]  [address]       Ph: [phone] or [phone]       Fax: [phone]   RxID:   [id number]

## 2010-09-02 NOTE — Progress Notes (Signed)
Summary: question about taking BS readings   Phone Note Call from Patient Call back at Home Phone [phone]   Caller: Patient Summary of Call: patient was seen by dr Etter Sjogren this morning---forgot to ask if she should  continue taking her blood sugar readings 4 times a day as directed on phone note dated 2/17 Initial call taken by: Berneta Sages,  August 26, 2010 2:32 PM  Follow-up for Phone Call        Pt aware to continue checking BS and hold coumadin today. Pt will come in tomorrow to have PT/INR checked and will call later once she has spoken to daughter to make appt...Marland KitchenMarland KitchenFelecia Deloach CMA  August 26, 2010 4:30 PM

## 2010-09-03 ENCOUNTER — Other Ambulatory Visit: Payer: Self-pay

## 2010-09-04 ENCOUNTER — Emergency Department (HOSPITAL_COMMUNITY)
Admission: EM | Admit: 2010-09-04 | Discharge: 2010-09-04 | Disposition: A | Discharge status: Home / Self Care | Payer: Medicare Other | Attending: Emergency Medicine | Admitting: Emergency Medicine

## 2010-09-04 DIAGNOSIS — E1169 Type 2 diabetes mellitus with other specified complication: Secondary | ICD-10-CM | POA: Insufficient documentation

## 2010-09-04 DIAGNOSIS — Z794 Long term (current) use of insulin: Secondary | ICD-10-CM | POA: Insufficient documentation

## 2010-09-04 DIAGNOSIS — N390 Urinary tract infection, site not specified: Secondary | ICD-10-CM | POA: Insufficient documentation

## 2010-09-04 LAB — URINALYSIS, ROUTINE W REFLEX MICROSCOPIC
Bilirubin Urine: NEGATIVE
Glucose, UA: 100 mg/dL — AB
Ketones, ur: NEGATIVE mg/dL
Nitrite: NEGATIVE
Specific Gravity, Urine: 1.016 (ref 1.005–1.030)
pH: 6 (ref 5.0–8.0)

## 2010-09-04 LAB — PROTIME-INR: INR: 3.51 — ABNORMAL HIGH (ref 0.00–1.49)

## 2010-09-04 LAB — POCT I-STAT, CHEM 8
BUN: 49 mg/dL — ABNORMAL HIGH (ref 6–23)
Creatinine, Ser: 2 mg/dL — ABNORMAL HIGH (ref 0.4–1.2)
Glucose, Bld: 201 mg/dL — ABNORMAL HIGH (ref 70–99)
Hemoglobin: 12.2 g/dL (ref 12.0–15.0)
Potassium: 3.4 mEq/L — ABNORMAL LOW (ref 3.5–5.1)
TCO2: 24 mmol/L (ref 0–100)

## 2010-09-04 LAB — DIFFERENTIAL
Basophils Absolute: 0 10*3/uL (ref 0.0–0.1)
Eosinophils Relative: 0 % (ref 0–5)
Lymphocytes Relative: 2 % — ABNORMAL LOW (ref 12–46)
Monocytes Absolute: 0.6 10*3/uL (ref 0.1–1.0)
Monocytes Relative: 3 % (ref 3–12)

## 2010-09-04 LAB — URINE MICROSCOPIC-ADD ON

## 2010-09-04 LAB — CBC
HCT: 31.4 % — ABNORMAL LOW (ref 36.0–46.0)
MCH: 28.9 pg (ref 26.0–34.0)
MCHC: 33.4 g/dL (ref 30.0–36.0)
RDW: 15.7 % — ABNORMAL HIGH (ref 11.5–15.5)

## 2010-09-04 LAB — GLUCOSE, CAPILLARY: Glucose-Capillary: 247 mg/dL — ABNORMAL HIGH (ref 70–99)

## 2010-09-06 ENCOUNTER — Inpatient Hospital Stay (HOSPITAL_COMMUNITY)
Admission: EM | Admit: 2010-09-06 | Discharge: 2010-09-23 | DRG: 811 | Disposition: A | Discharge status: Skilled Nursing Facility | Payer: Medicare Other | Attending: Internal Medicine | Admitting: Internal Medicine

## 2010-09-06 ENCOUNTER — Emergency Department (HOSPITAL_COMMUNITY): Payer: Medicare Other

## 2010-09-06 DIAGNOSIS — K264 Chronic or unspecified duodenal ulcer with hemorrhage: Secondary | ICD-10-CM | POA: Diagnosis present

## 2010-09-06 DIAGNOSIS — E785 Hyperlipidemia, unspecified: Secondary | ICD-10-CM | POA: Diagnosis present

## 2010-09-06 DIAGNOSIS — E876 Hypokalemia: Secondary | ICD-10-CM | POA: Diagnosis present

## 2010-09-06 DIAGNOSIS — N390 Urinary tract infection, site not specified: Secondary | ICD-10-CM | POA: Diagnosis present

## 2010-09-06 DIAGNOSIS — R5381 Other malaise: Secondary | ICD-10-CM | POA: Diagnosis present

## 2010-09-06 DIAGNOSIS — N179 Acute kidney failure, unspecified: Secondary | ICD-10-CM | POA: Diagnosis present

## 2010-09-06 DIAGNOSIS — Z87891 Personal history of nicotine dependence: Secondary | ICD-10-CM

## 2010-09-06 DIAGNOSIS — N189 Chronic kidney disease, unspecified: Secondary | ICD-10-CM | POA: Diagnosis present

## 2010-09-06 DIAGNOSIS — D62 Acute posthemorrhagic anemia: Principal | ICD-10-CM | POA: Diagnosis present

## 2010-09-06 DIAGNOSIS — M109 Gout, unspecified: Secondary | ICD-10-CM | POA: Diagnosis not present

## 2010-09-06 DIAGNOSIS — Z7901 Long term (current) use of anticoagulants: Secondary | ICD-10-CM

## 2010-09-06 DIAGNOSIS — E1169 Type 2 diabetes mellitus with other specified complication: Secondary | ICD-10-CM | POA: Diagnosis present

## 2010-09-06 DIAGNOSIS — I2510 Atherosclerotic heart disease of native coronary artery without angina pectoris: Secondary | ICD-10-CM | POA: Diagnosis present

## 2010-09-06 DIAGNOSIS — I509 Heart failure, unspecified: Secondary | ICD-10-CM | POA: Diagnosis present

## 2010-09-06 DIAGNOSIS — J90 Pleural effusion, not elsewhere classified: Secondary | ICD-10-CM | POA: Diagnosis present

## 2010-09-06 DIAGNOSIS — I129 Hypertensive chronic kidney disease with stage 1 through stage 4 chronic kidney disease, or unspecified chronic kidney disease: Secondary | ICD-10-CM | POA: Diagnosis present

## 2010-09-06 DIAGNOSIS — J189 Pneumonia, unspecified organism: Secondary | ICD-10-CM | POA: Diagnosis present

## 2010-09-06 DIAGNOSIS — Z9861 Coronary angioplasty status: Secondary | ICD-10-CM

## 2010-09-06 DIAGNOSIS — Z950 Presence of cardiac pacemaker: Secondary | ICD-10-CM

## 2010-09-06 DIAGNOSIS — I4891 Unspecified atrial fibrillation: Secondary | ICD-10-CM | POA: Diagnosis present

## 2010-09-06 LAB — DIFFERENTIAL
Basophils Absolute: 0 10*3/uL (ref 0.0–0.1)
Eosinophils Absolute: 0.1 10*3/uL (ref 0.0–0.7)
Eosinophils Relative: 0 % (ref 0–5)
Monocytes Absolute: 0.9 10*3/uL (ref 0.1–1.0)

## 2010-09-06 LAB — COMPREHENSIVE METABOLIC PANEL
AST: 76 U/L — ABNORMAL HIGH (ref 0–37)
Albumin: 2.9 g/dL — ABNORMAL LOW (ref 3.5–5.2)
Alkaline Phosphatase: 37 U/L — ABNORMAL LOW (ref 39–117)
BUN: 51 mg/dL — ABNORMAL HIGH (ref 6–23)
Chloride: 97 mEq/L (ref 96–112)
GFR calc Af Amer: 23 mL/min — ABNORMAL LOW (ref >60)
Potassium: 3.3 mEq/L — ABNORMAL LOW (ref 3.5–5.1)
Total Protein: 6.8 g/dL (ref 6.0–8.3)

## 2010-09-06 LAB — GLUCOSE, CAPILLARY
Glucose-Capillary: 111 mg/dL — ABNORMAL HIGH (ref 70–99)
Glucose-Capillary: >600 mg/dL (ref 70–99)
Glucose-Capillary: 160 mg/dL — ABNORMAL HIGH (ref 70–99)
Glucose-Capillary: 162 mg/dL — ABNORMAL HIGH (ref 70–99)
Glucose-Capillary: 136 mg/dL — ABNORMAL HIGH (ref 70–99)

## 2010-09-06 LAB — POCT CARDIAC MARKERS
CKMB, poc: 10 ng/mL (ref 1.0–8.0)
Myoglobin, poc: >500 ng/mL (ref 12–200)

## 2010-09-06 LAB — CBC
Hemoglobin: 7.7 g/dL — ABNORMAL LOW (ref 12.0–15.0)
Hemoglobin: 8 g/dL — ABNORMAL LOW (ref 12.0–15.0)
MCH: 28 pg (ref 26.0–34.0)
MCH: 28.8 pg (ref 26.0–34.0)
MCHC: 33.3 g/dL (ref 30.0–36.0)
MCHC: 32.8 g/dL (ref 30.0–36.0)
MCHC: 33.2 g/dL (ref 30.0–36.0)
MCV: 86.7 fL (ref 78.0–100.0)
Platelets: 355 10*3/uL (ref 150–400)
Platelets: 349 10*3/uL (ref 150–400)
RBC: 2.7 MIL/uL — ABNORMAL LOW (ref 3.87–5.11)
RDW: 15.6 % — ABNORMAL HIGH (ref 11.5–15.5)
RDW: 15.6 % — ABNORMAL HIGH (ref 11.5–15.5)
WBC: 12.1 10*3/uL — ABNORMAL HIGH (ref 4.0–10.5)

## 2010-09-06 LAB — CK TOTAL AND CKMB (NOT AT ARMC)
CK, MB: 8.6 ng/mL (ref 0.3–4.0)
Total CK: 154 U/L (ref 7–177)

## 2010-09-06 LAB — BRAIN NATRIURETIC PEPTIDE: Pro B Natriuretic peptide (BNP): 495 pg/mL — ABNORMAL HIGH (ref 0.0–100.0)

## 2010-09-06 LAB — BASIC METABOLIC PANEL
Calcium: 8 mg/dL — ABNORMAL LOW (ref 8.4–10.5)
GFR calc Af Amer: 26 mL/min — ABNORMAL LOW (ref >60)
GFR calc non Af Amer: 22 mL/min — ABNORMAL LOW (ref >60)
Potassium: 3.7 mEq/L (ref 3.5–5.1)
Sodium: 129 mEq/L — ABNORMAL LOW (ref 135–145)

## 2010-09-06 LAB — ABO/RH: ABO/RH(D): A NEG

## 2010-09-06 LAB — VITAMIN B12: Vitamin B-12: 354 pg/mL (ref 211–911)

## 2010-09-06 LAB — PROTIME-INR
INR: 3.06 — ABNORMAL HIGH (ref 0.00–1.49)
Prothrombin Time: 31.7 seconds — ABNORMAL HIGH (ref 11.6–15.2)

## 2010-09-06 LAB — TSH: TSH: 0.669 u[IU]/mL (ref 0.350–4.500)

## 2010-09-06 LAB — IRON AND TIBC

## 2010-09-06 LAB — TROPONIN I
Troponin I: 0.01 ng/mL (ref 0.00–0.06)
Troponin I: 0.02 ng/mL (ref 0.00–0.06)

## 2010-09-06 LAB — CARDIAC PANEL(CRET KIN+CKTOT+MB+TROPI): Relative Index: 5.1 — ABNORMAL HIGH (ref 0.0–2.5)

## 2010-09-06 LAB — FERRITIN: Ferritin: 85 ng/mL (ref 10–291)

## 2010-09-07 ENCOUNTER — Inpatient Hospital Stay (HOSPITAL_COMMUNITY): Payer: Medicare Other

## 2010-09-07 DIAGNOSIS — K277 Chronic peptic ulcer, site unspecified, without hemorrhage or perforation: Secondary | ICD-10-CM

## 2010-09-07 DIAGNOSIS — D509 Iron deficiency anemia, unspecified: Secondary | ICD-10-CM

## 2010-09-07 LAB — CBC
HCT: 29.1 % — ABNORMAL LOW (ref 36.0–46.0)
Platelets: 333 10*3/uL (ref 150–400)
RDW: 15.4 % (ref 11.5–15.5)
WBC: 8.2 10*3/uL (ref 4.0–10.5)

## 2010-09-07 LAB — GLUCOSE, CAPILLARY
Glucose-Capillary: 105 mg/dL — ABNORMAL HIGH (ref 70–99)
Glucose-Capillary: 122 mg/dL — ABNORMAL HIGH (ref 70–99)
Glucose-Capillary: 159 mg/dL — ABNORMAL HIGH (ref 70–99)
Glucose-Capillary: 191 mg/dL — ABNORMAL HIGH (ref 70–99)
Glucose-Capillary: 83 mg/dL (ref 70–99)

## 2010-09-07 LAB — TYPE AND SCREEN
Antibody Screen: NEGATIVE
Unit division: 0

## 2010-09-07 LAB — LIPID PANEL
Cholesterol: 91 mg/dL (ref 0–200)
HDL: 37 mg/dL — ABNORMAL LOW (ref >39)
LDL Cholesterol: 34 mg/dL (ref 0–99)
Triglycerides: 101 mg/dL (ref <150)

## 2010-09-07 LAB — PROTIME-INR: INR: 4.48 — ABNORMAL HIGH (ref 0.00–1.49)

## 2010-09-07 LAB — BASIC METABOLIC PANEL
Calcium: 7.7 mg/dL — ABNORMAL LOW (ref 8.4–10.5)
GFR calc Af Amer: 28 mL/min — ABNORMAL LOW (ref >60)
GFR calc non Af Amer: 23 mL/min — ABNORMAL LOW (ref >60)
Glucose, Bld: 82 mg/dL (ref 70–99)
Potassium: 3 mEq/L — ABNORMAL LOW (ref 3.5–5.1)
Sodium: 131 mEq/L — ABNORMAL LOW (ref 135–145)

## 2010-09-07 LAB — HEMOGLOBIN A1C: Hgb A1c MFr Bld: 5.9 % — ABNORMAL HIGH (ref <5.7)

## 2010-09-07 NOTE — Progress Notes (Signed)
Summary: dicuss meds  Phone Note Call from Patient Call back at Home Phone [phone]   Caller: Patient Call For: Dr Deatra Ina Reason for Call: Talk to Nurse Summary of Call: Patient wants to discuss meds with nurse, had proc this morning. Initial call taken by: Ronalee Red,  September 01, 2010 1:46 PM  Follow-up for Phone Call        Patient is wanting to know if she should resume her coumadin back tonight. She was not sure what she was supposed to do. Dr. Deatra Ina please advise. Follow-up by: Rosanne Sack RN,  September 01, 2010 2:15 PM  Additional Follow-up for Phone Call Additional follow up Details #1::        She should speak with Dr. Etter Sjogren regarding coumadin Additional Follow-up by: Inda Castle MD,  September 02, 2010 8:19 AM    Additional Follow-up for Phone Call Additional follow up Details #2::    Patient aware of Dr. Kelby Fam recommendation. Follow-up by: Rosanne Sack RN,  September 02, 2010 8:55 AM

## 2010-09-07 NOTE — Letter (Signed)
Summary: EGD Instructions  [facility]  [place], [place] [address]   Phone: [phone]  Fax: [phone]       Yolanda Huffman    Dec 15, 1940    MRN: [id number]       Procedure Day /Date:09/01/10     Arrival Time: 8am     Procedure Time:9am     Location of Procedure:                     [facility]  _ [facility] ( Outpatient Registration)   PREPARATION FOR ENDOSCOPY   On 09/01/10 THE DAY OF THE PROCEDURE:  1.   No solid foods, milk or milk products are allowed after midnight the night before your procedure.  2.   Do not drink anything colored red or purple.  Avoid juices with pulp.  No orange juice.  3.  You may drink clear liquids until 5am, which is 4 hours before your procedure.                                                                                                CLEAR LIQUIDS INCLUDE: Water Jello Ice Popsicles Tea (sugar ok, no milk/cream) Powdered fruit flavored drinks Coffee (sugar ok, no milk/cream) Gatorade Juice: apple, white grape, white cranberry  Lemonade Clear bullion, consomm, broth Carbonated beverages (any kind) Strained chicken noodle soup Hard Candy   MEDICATION INSTRUCTIONS  Unless otherwise instructed, you should take regular prescription medications with a small sip of water as early as possible the morning of your procedure.  Diabetic patients - see separate instructions.            OTHER INSTRUCTIONS  You will need a responsible adult at least 70 years of age to accompany you and drive you home.   This person must remain in the waiting room during your procedure.  Wear loose fitting clothing that is easily removed.  Leave jewelry and other valuables at home.  However, you may wish to bring a book to read or an iPod/MP3 player to listen to music as you wait for your procedure to start.  Remove all body piercing jewelry and leave at home.  Total time from sign-in until discharge is approximately  2-3 hours.  You should go home directly after your procedure and rest.  You can resume normal activities the day after your procedure.  The day of your procedure you should not:   Drive   Make legal decisions   Operate machinery   Drink alcohol   Return to work  You will receive specific instructions about eating, activities and medications before you leave.    The above instructions have been reviewed and explained to me by   _______________________    I fully understand and can verbalize these instructions _____________________________ Date _________

## 2010-09-07 NOTE — Procedures (Signed)
Summary: Upper Endoscopy  Patient: Yolanda Huffman Note: All result statuses are Final unless otherwise noted.  Tests: (1) Upper Endoscopy (EGD)   EGD Upper Endoscopy       DONE     [facility]     [place], [place]  [address]          ENDOSCOPY PROCEDURE REPORT          PATIENT:  Yolanda Huffman  MR#:  [id number]     BIRTHDATE:  1941/02/23, 75 yrs. old  GENDER:  female          ENDOSCOPIST:  Sandy Salaam. Deatra Ina, MD     Referred by:  [name], DO          PROCEDURE DATE:  09/01/2010     PROCEDURE:  EGD, diagnostic [id number]     ASA CLASS:  Class II     INDICATIONS:  anemia          MEDICATIONS:   Fentanyl 37.5 mcg IV, Versed 4 mg IV,     glycopyrrolate (Robinal) 0.2 mg     TOPICAL ANESTHETIC:  Cetacaine Spray          DESCRIPTION OF PROCEDURE:   After the risks benefits and     alternatives of the procedure were thoroughly explained, informed     consent was obtained.  The Pentax Gastroscope [id number] endoscope     was introduced through the mouth and advanced to the third portion     of the duodenum, without limitations.  The instrument was slowly     withdrawn as the mucosa was fully examined.     <<PROCEDUREIMAGES>>          Multiple ulcers were found in the bulb of the duodenum (see     image4). Multiple superficial 1-46mm clean-based ulcers  Otherwise     the examination was normal (see image3, image5, image6, image7,     image9, and image11).    Retroflexed views revealed no     abnormalities.    The scope was then withdrawn from the patient     and the procedure completed.          COMPLICATIONS:  None          ENDOSCOPIC IMPRESSION:     1) Ulcers, multiple in the bulb of duodenum     2) Otherwise normal examination     RECOMMENDATIONS:     1) continue PPI     2) hemmoccult stools in 2-3 weeks     3) Call office next 2-3 days to schedule an office appointment     for 1 month     4) CBC 1 month          REPEAT EXAM:  No       ______________________________     Sandy Salaam. Deatra Ina, MD          CC:          n.     eSIGNED:   Sandy Salaam. Kaplan at 09/01/2010 09:37 AM          Lowella Petties, [id number]  Note: An exclamation mark (!) indicates a result that was not dispersed into the flowsheet. Document Creation Date: 09/01/2010 9:38 AM _______________________________________________________________________  (1) Order result status: Final Collection or observation date-time: 09/01/2010 09:29 Requested date-time:  Receipt date-time:  Reported date-time:  Referring Physician:   Ordering Physician: Erskine Emery [phone]) Specimen Source:  Source: Tawanna Cooler  Order Number: [id number] Lab site:

## 2010-09-07 NOTE — Progress Notes (Signed)
  Phone Note Outgoing Call   Call placed by: Lowne Call placed to: Patient Summary of Call: Called pt to see how she was feeling.  CBCD and BMP were supposed to be redrawn yesterday and no results in computer yet.  Pt states she is feeling a little better today.  She denies any bleeding from rectum etc.  She is not taking any more coumadin or aspirin and took 1/2 vita k yesterday.  Pt has an appointment Monday for repeat PT and we will recheck cbc then too.  Pt instructed to go to ER if any bleeding or if she has an increase in symptoms.   Initial call taken by: [name] DO,  August 28, 2010 9:35 AM  Follow-up for Phone Call        ok Follow-up by: Inda Castle MD,  August 30, 2010 11:04 AM

## 2010-09-07 NOTE — Letter (Signed)
Summary: Diabetic Instructions  [facility]  [place], [place] [address]   Phone: [phone]  Fax: [phone]    Yolanda Huffman 1941-01-23 MRN: [id number]   _X  _   ORAL DIABETIC MEDICATION INSTRUCTIONS  The day before your procedure:   Take your diabetic pill as you do normally  The day of your procedure:   Do not take your diabetic pill    We will check your blood sugar levels during the admission process and again in Recovery before discharging you home  ________________________________________________________________________  _ X _   INSULIN (LONG ACTING) MEDICATION INSTRUCTIONS (Lantus, NPH, 70/30, Humulin, Novolin-N)   The day before your procedure:   Take  your regular evening dose    The day of your procedure:   Do not take your morning dose    _  _   INSULIN (SHORT ACTING) MEDICATION INSTRUCTIONS (Regular, Humulog, Novolog)   The day before your procedure:   Do not take your evening dose   The day of your procedure:   Do not take your morning dose   _  _   INSULIN PUMP MEDICATION INSTRUCTIONS  We will contact the physician managing your diabetic care for written dosage instructions for the day before your procedure and the day of your procedure.  Once we have received the instructions, we will contact you.  Appended Document: EGD    Clinical Lists Changes  Orders: Added new Test order of ZEGD (ZEGD) - Signed

## 2010-09-07 NOTE — Medication Information (Signed)
Summary: pt/inr check stat/cdj   PCP: [name], DO          Comments: no charge per [facility].Felecia Deloach CMA  August 30, 2010 4:51 PM   Current Medications (verified): 1)  Levemir 100 Unit/ml Soln (Insulin Detemir) .Marland Kitchen.. 18 U Subcutaneously Qpm 2)  Fish Oil 1000 Mg Caps (Omega-3 Fatty Acids) .... By Mouth Qd 3)  Calcium Carbonate 600 Mg Tabs (Calcium Carbonate) .Marland Kitchen.. 1 By Mouth Qd 4)  Vitamin D 400 Unit Caps (Cholecalciferol) .... Take 1 Tablet By Mouth Once A Day 5)  Sotalol Hcl 80 Mg Tabs (Sotalol Hcl) .Marland Kitchen.. 1 By Mouth Bid 6)  Lipitor 20 Mg Tabs (Atorvastatin Calcium) .Marland Kitchen.. 1 By Mouth At Bedtime 7)  Diovan Hct 320-25 Mg Tabs (Valsartan-Hydrochlorothiazide) .Marland Kitchen.. 1 By Mouth Qd 8)  Slow-Mag 71.5-119 Mg Tbec (Magnesium Cl-Calcium Carbonate) .Marland Kitchen.. 1 By Mouth Qd 9)  Aspirin 81 Mg Tbec (Aspirin) .Marland Kitchen.. 1 By Mouth Qd 10)  Tekturna 300 Mg Tabs (Aliskiren Fumarate) .... Take One Tablet Daily 11)  Warfarin Sodium 5 Mg Tabs (Warfarin Sodium) .... Uad 12)  Amlodipine Besylate 10 Mg Tabs (Amlodipine Besylate) .Marland Kitchen.. 1 By Mouth Once Daily 13)  Fenofibrate Micronized 134 Mg Caps (Fenofibrate Micronized) 14)  One Touch Ultra Mini Strips .... Accu Checks Two Times A Day 15)  Prilosec Otc 20 Mg Tbec (Omeprazole Magnesium) .Marland Kitchen.. 1 By Mouth Once Daily 16)  Aleve 220 Mg Tabs (Naproxen Sodium) .Marland Kitchen.. 1-2 Every 8 Hours As Needed 17)  Metformin Hcl 500 Mg Tabs (Metformin Hcl) .Marland Kitchen.. 1 By Mouth Two Times A Day 18)  Slow Fe 160 (50 Fe) Mg Cr-Tabs (Ferrous Sulfate Dried) .Marland Kitchen.. 1 By Mouth Once Daily 19)  Humulin N Pen 100 Unit/ml Susp (Insulin Isophane Human) .Marland Kitchen.. 10 Units Before Lunch and 10 Units Before Dinner 20)  Vitamin K 5mg  .... 1/2 Tablet Today  Allergies (verified): No Known Drug Allergies  Anticoagulation Management History:      Positive risk factors for bleeding include an age of 26 years or older, history of GI bleeding, and presence of serious comorbidities.  The bleeding index is 'high risk'.   Positive CHADS2 values include History of HTN and History of Diabetes.  Negative CHADS2 values include Age > 41 years old.  Her last INR was >10.00 and today's INR is 2.7.    Anticoagulation Management Assessment/Plan:      The patient's current anticoagulation dose is Warfarin sodium 5 mg tabs: UAD.  The next INR is due 09/06/2010.         Current Anticoagulation Instructions: con't to hold coumadin secondary to procedure being done Wednesday with GI.    Laboratory Results   Blood Tests      INR: 2.7   (Normal Range: 0.88-1.12   Therap INR: 2.0-3.5)

## 2010-09-07 NOTE — Letter (Signed)
Summary: Appt Reminder [facility]  [address] [place], [place] [address]   Phone: [phone]  Fax: [phone]        September 01, 2010 MRN: [id number]    [facility] [address] [place], [place]  [address]    Dear [name],   You have a return appointment with Dr. Deatra Ina on 10/11/10 at 2:45pm.  Please remember to bring a complete list of the medicines you are taking, your insurance card and your co-pay.  If you have to cancel or reschedule this appointment, please call before 5:00 pm the evening before to avoid a cancellation fee.  If you have any questions or concerns, please call [phone].    Sincerely,    Rosanne Sack RN  Appended Document: Appt Reminder 2 Letter is mailed to the patient's home address

## 2010-09-07 NOTE — Progress Notes (Signed)
Summary: restart coumadin  Phone Note Call from Patient Call back at Home Phone [phone]   Caller: Patient Summary of Call: Pt states that she had endoscopy done on yesterday and would like to know when she needs to resume her coumadin. Pls advise...........Marland KitchenFelecia Deloach CMA  September 02, 2010 9:10 AM   Follow-up for Phone Call        I need to know what she was taking --we will need to decrease dose from that and then bring her in next week.   Follow-up by: [name] DO,  September 02, 2010 10:14 AM  Additional Follow-up for Phone Call Additional follow up Details #1::        patient was taking 2.5 m,w,f and 5mg  the rest of the time..... Additional Follow-up by: Aron Baba CMA Deborra Medina),  September 02, 2010 4:30 PM    Additional Follow-up for Phone Call Additional follow up Details #2::    take 2.5 mg daily---- check PT next week Follow-up by: [name] DO,  September 02, 2010 4:58 PM  Additional Follow-up for Phone Call Additional follow up Details #3:: Details for Additional Follow-up Action Taken: pt aware of the above and appt scheduled for Thursday 09/09/10  @ 10.... Aron Baba CMA Deborra Medina)  September 02, 2010 5:16 PM

## 2010-09-08 ENCOUNTER — Inpatient Hospital Stay (HOSPITAL_COMMUNITY): Payer: Medicare Other

## 2010-09-08 DIAGNOSIS — K277 Chronic peptic ulcer, site unspecified, without hemorrhage or perforation: Secondary | ICD-10-CM

## 2010-09-08 DIAGNOSIS — D509 Iron deficiency anemia, unspecified: Secondary | ICD-10-CM

## 2010-09-08 DIAGNOSIS — I369 Nonrheumatic tricuspid valve disorder, unspecified: Secondary | ICD-10-CM

## 2010-09-08 LAB — COMPREHENSIVE METABOLIC PANEL
ALT: 36 U/L — ABNORMAL HIGH (ref 0–35)
AST: 50 U/L — ABNORMAL HIGH (ref 0–37)
Albumin: 2.4 g/dL — ABNORMAL LOW (ref 3.5–5.2)
Calcium: 7.7 mg/dL — ABNORMAL LOW (ref 8.4–10.5)
Creatinine, Ser: 2.32 mg/dL — ABNORMAL HIGH (ref 0.4–1.2)
GFR calc Af Amer: 25 mL/min — ABNORMAL LOW (ref >60)
Sodium: 133 mEq/L — ABNORMAL LOW (ref 135–145)
Total Protein: 5.8 g/dL — ABNORMAL LOW (ref 6.0–8.3)

## 2010-09-08 LAB — CBC
MCH: 28.2 pg (ref 26.0–34.0)
MCHC: 32.6 g/dL (ref 30.0–36.0)
MCV: 86.7 fL (ref 78.0–100.0)
Platelets: 322 10*3/uL (ref 150–400)
RBC: 3.47 MIL/uL — ABNORMAL LOW (ref 3.87–5.11)
RDW: 15.8 % — ABNORMAL HIGH (ref 11.5–15.5)

## 2010-09-08 LAB — URINE CULTURE

## 2010-09-08 LAB — GLUCOSE, CAPILLARY
Glucose-Capillary: 123 mg/dL — ABNORMAL HIGH (ref 70–99)
Glucose-Capillary: 129 mg/dL — ABNORMAL HIGH (ref 70–99)
Glucose-Capillary: 93 mg/dL (ref 70–99)

## 2010-09-08 LAB — PROTIME-INR: INR: 4.8 — ABNORMAL HIGH (ref 0.00–1.49)

## 2010-09-08 LAB — H. PYLORI ANTIBODY, IGG: H Pylori IgG: 0.62 {ISR}

## 2010-09-09 ENCOUNTER — Ambulatory Visit: Payer: Medicare Other

## 2010-09-09 DIAGNOSIS — R0989 Other specified symptoms and signs involving the circulatory and respiratory systems: Secondary | ICD-10-CM

## 2010-09-09 DIAGNOSIS — R0609 Other forms of dyspnea: Secondary | ICD-10-CM

## 2010-09-09 LAB — BASIC METABOLIC PANEL
GFR calc non Af Amer: 22 mL/min — ABNORMAL LOW (ref >60)
Glucose, Bld: 114 mg/dL — ABNORMAL HIGH (ref 70–99)
Potassium: 3.1 mEq/L — ABNORMAL LOW (ref 3.5–5.1)
Sodium: 131 mEq/L — ABNORMAL LOW (ref 135–145)

## 2010-09-09 LAB — CBC
HCT: 31.4 % — ABNORMAL LOW (ref 36.0–46.0)
Hemoglobin: 10.2 g/dL — ABNORMAL LOW (ref 12.0–15.0)
MCH: 28.3 pg (ref 26.0–34.0)
RBC: 3.6 MIL/uL — ABNORMAL LOW (ref 3.87–5.11)

## 2010-09-09 LAB — GLUCOSE, CAPILLARY
Glucose-Capillary: 129 mg/dL — ABNORMAL HIGH (ref 70–99)
Glucose-Capillary: 153 mg/dL — ABNORMAL HIGH (ref 70–99)

## 2010-09-09 LAB — PROTIME-INR: Prothrombin Time: 25 seconds — ABNORMAL HIGH (ref 11.6–15.2)

## 2010-09-09 LAB — HEMOCCULT GUIAC POC 1CARD (OFFICE): Fecal Occult Bld: NEGATIVE

## 2010-09-10 ENCOUNTER — Ambulatory Visit: Payer: Medicare Other | Admitting: Family Medicine

## 2010-09-10 LAB — CBC
Hemoglobin: 10.2 g/dL — ABNORMAL LOW (ref 12.0–15.0)
RBC: 3.63 MIL/uL — ABNORMAL LOW (ref 3.87–5.11)

## 2010-09-10 LAB — BASIC METABOLIC PANEL
GFR calc Af Amer: 29 mL/min — ABNORMAL LOW (ref >60)
GFR calc non Af Amer: 24 mL/min — ABNORMAL LOW (ref >60)
Potassium: 3.3 mEq/L — ABNORMAL LOW (ref 3.5–5.1)
Sodium: 130 mEq/L — ABNORMAL LOW (ref 135–145)

## 2010-09-10 LAB — GLUCOSE, CAPILLARY
Glucose-Capillary: 125 mg/dL — ABNORMAL HIGH (ref 70–99)
Glucose-Capillary: 151 mg/dL — ABNORMAL HIGH (ref 70–99)

## 2010-09-10 LAB — MAGNESIUM: Magnesium: 2.1 mg/dL (ref 1.5–2.5)

## 2010-09-10 LAB — PROTIME-INR
INR: 1.68 — ABNORMAL HIGH (ref 0.00–1.49)
Prothrombin Time: 20 seconds — ABNORMAL HIGH (ref 11.6–15.2)

## 2010-09-11 LAB — BASIC METABOLIC PANEL
CO2: 21 mEq/L (ref 19–32)
Calcium: 8.8 mg/dL (ref 8.4–10.5)
Chloride: 102 mEq/L (ref 96–112)
GFR calc Af Amer: 24 mL/min — ABNORMAL LOW (ref >60)
Glucose, Bld: 110 mg/dL — ABNORMAL HIGH (ref 70–99)
Potassium: 3.8 mEq/L (ref 3.5–5.1)
Sodium: 132 mEq/L — ABNORMAL LOW (ref 135–145)

## 2010-09-11 LAB — HEPATIC FUNCTION PANEL
ALT: 33 U/L (ref 0–35)
AST: 39 U/L — ABNORMAL HIGH (ref 0–37)
Albumin: 2.8 g/dL — ABNORMAL LOW (ref 3.5–5.2)
Alkaline Phosphatase: 38 U/L — ABNORMAL LOW (ref 39–117)
Bilirubin, Direct: 0.1 mg/dL (ref 0.0–0.3)
Total Bilirubin: 0.8 mg/dL (ref 0.3–1.2)

## 2010-09-11 LAB — CBC
HCT: 30.6 % — ABNORMAL LOW (ref 36.0–46.0)
Hemoglobin: 9.7 g/dL — ABNORMAL LOW (ref 12.0–15.0)
MCHC: 31.7 g/dL (ref 30.0–36.0)
RBC: 3.49 MIL/uL — ABNORMAL LOW (ref 3.87–5.11)

## 2010-09-11 LAB — GLUCOSE, CAPILLARY
Glucose-Capillary: 118 mg/dL — ABNORMAL HIGH (ref 70–99)
Glucose-Capillary: 175 mg/dL — ABNORMAL HIGH (ref 70–99)

## 2010-09-11 LAB — PROTIME-INR: INR: 1.76 — ABNORMAL HIGH (ref 0.00–1.49)

## 2010-09-12 LAB — GLUCOSE, CAPILLARY
Glucose-Capillary: 112 mg/dL — ABNORMAL HIGH (ref 70–99)
Glucose-Capillary: 128 mg/dL — ABNORMAL HIGH (ref 70–99)
Glucose-Capillary: 175 mg/dL — ABNORMAL HIGH (ref 70–99)

## 2010-09-12 LAB — URINALYSIS, ROUTINE W REFLEX MICROSCOPIC
Bilirubin Urine: NEGATIVE
Ketones, ur: NEGATIVE mg/dL
Specific Gravity, Urine: 1.016 (ref 1.005–1.030)
pH: 6 (ref 5.0–8.0)

## 2010-09-12 LAB — BASIC METABOLIC PANEL
BUN: 39 mg/dL — ABNORMAL HIGH (ref 6–23)
CO2: 23 mEq/L (ref 19–32)
Chloride: 104 mEq/L (ref 96–112)
Potassium: 4 mEq/L (ref 3.5–5.1)

## 2010-09-12 LAB — PROTIME-INR: INR: 2.25 — ABNORMAL HIGH (ref 0.00–1.49)

## 2010-09-12 LAB — CULTURE, BLOOD (ROUTINE X 2)
Culture  Setup Time: 201203120918
Culture: NO GROWTH

## 2010-09-12 LAB — CBC
HCT: 30.5 % — ABNORMAL LOW (ref 36.0–46.0)
Hemoglobin: 9.7 g/dL — ABNORMAL LOW (ref 12.0–15.0)
MCHC: 31.8 g/dL (ref 30.0–36.0)
MCV: 88.9 fL (ref 78.0–100.0)
RDW: 16.9 % — ABNORMAL HIGH (ref 11.5–15.5)

## 2010-09-12 LAB — URINE MICROSCOPIC-ADD ON

## 2010-09-13 ENCOUNTER — Inpatient Hospital Stay (HOSPITAL_COMMUNITY): Payer: Medicare Other

## 2010-09-13 LAB — CBC
MCHC: 31.9 g/dL (ref 30.0–36.0)
Platelets: 338 10*3/uL (ref 150–400)
RDW: 17.1 % — ABNORMAL HIGH (ref 11.5–15.5)

## 2010-09-13 LAB — GLUCOSE, CAPILLARY: Glucose-Capillary: 114 mg/dL — ABNORMAL HIGH (ref 70–99)

## 2010-09-13 LAB — BASIC METABOLIC PANEL
BUN: 45 mg/dL — ABNORMAL HIGH (ref 6–23)
Calcium: 8.9 mg/dL (ref 8.4–10.5)
Chloride: 105 mEq/L (ref 96–112)
Creatinine, Ser: 2.75 mg/dL — ABNORMAL HIGH (ref 0.4–1.2)
GFR calc non Af Amer: 17 mL/min — ABNORMAL LOW (ref >60)

## 2010-09-13 LAB — PROTIME-INR
INR: 2.47 — ABNORMAL HIGH (ref 0.00–1.49)
Prothrombin Time: 26.9 seconds — ABNORMAL HIGH (ref 11.6–15.2)

## 2010-09-14 LAB — GLUCOSE, CAPILLARY
Glucose-Capillary: 132 mg/dL — ABNORMAL HIGH (ref 70–99)
Glucose-Capillary: 133 mg/dL — ABNORMAL HIGH (ref 70–99)
Glucose-Capillary: 120 mg/dL — ABNORMAL HIGH (ref 70–99)
Glucose-Capillary: 137 mg/dL — ABNORMAL HIGH (ref 70–99)

## 2010-09-14 LAB — RENAL FUNCTION PANEL
Albumin: 3 g/dL — ABNORMAL LOW (ref 3.5–5.2)
BUN: 50 mg/dL — ABNORMAL HIGH (ref 6–23)
Chloride: 103 mEq/L (ref 96–112)
Glucose, Bld: 133 mg/dL — ABNORMAL HIGH (ref 70–99)
Potassium: 3.6 mEq/L (ref 3.5–5.1)
Sodium: 133 mEq/L — ABNORMAL LOW (ref 135–145)

## 2010-09-14 LAB — PROTIME-INR
INR: 2.05 — ABNORMAL HIGH (ref 0.00–1.49)
Prothrombin Time: 23.3 seconds — ABNORMAL HIGH (ref 11.6–15.2)

## 2010-09-15 LAB — CBC
HCT: 31.9 % — ABNORMAL LOW (ref 36.0–46.0)
Hemoglobin: 10.4 g/dL — ABNORMAL LOW (ref 12.0–15.0)
MCH: 28.6 pg (ref 26.0–34.0)
MCHC: 32.6 g/dL (ref 30.0–36.0)
MCV: 87.6 fL (ref 78.0–100.0)
RBC: 3.64 MIL/uL — ABNORMAL LOW (ref 3.87–5.11)

## 2010-09-15 LAB — URINALYSIS, ROUTINE W REFLEX MICROSCOPIC
Bilirubin Urine: NEGATIVE
Nitrite: NEGATIVE
Specific Gravity, Urine: 1.01 (ref 1.005–1.030)
Urobilinogen, UA: 0.2 mg/dL (ref 0.0–1.0)
pH: 6 (ref 5.0–8.0)

## 2010-09-15 LAB — GLUCOSE, CAPILLARY
Glucose-Capillary: 174 mg/dL — ABNORMAL HIGH (ref 70–99)
Glucose-Capillary: 122 mg/dL — ABNORMAL HIGH (ref 70–99)

## 2010-09-15 LAB — PROTEIN / CREATININE RATIO, URINE: Total Protein, Urine: 10 mg/dL

## 2010-09-15 LAB — BASIC METABOLIC PANEL
BUN: 52 mg/dL — ABNORMAL HIGH (ref 6–23)
CO2: 23 mEq/L (ref 19–32)
Calcium: 9.4 mg/dL (ref 8.4–10.5)
Chloride: 101 mEq/L (ref 96–112)
Creatinine, Ser: 3.18 mg/dL — ABNORMAL HIGH (ref 0.4–1.2)
GFR calc Af Amer: 17 mL/min — ABNORMAL LOW (ref >60)
Glucose, Bld: 125 mg/dL — ABNORMAL HIGH (ref 70–99)

## 2010-09-15 LAB — URINE MICROSCOPIC-ADD ON

## 2010-09-15 LAB — PROTIME-INR: Prothrombin Time: 21.8 seconds — ABNORMAL HIGH (ref 11.6–15.2)

## 2010-09-15 LAB — C3 COMPLEMENT: C3 Complement: 156 mg/dL (ref 88–201)

## 2010-09-16 ENCOUNTER — Inpatient Hospital Stay (HOSPITAL_COMMUNITY): Payer: Medicare Other

## 2010-09-16 ENCOUNTER — Other Ambulatory Visit: Payer: Self-pay | Admitting: Gastroenterology

## 2010-09-16 DIAGNOSIS — K921 Melena: Secondary | ICD-10-CM

## 2010-09-16 DIAGNOSIS — Z1211 Encounter for screening for malignant neoplasm of colon: Secondary | ICD-10-CM

## 2010-09-16 LAB — RENAL FUNCTION PANEL
BUN: 54 mg/dL — ABNORMAL HIGH (ref 6–23)
CO2: 24 mEq/L (ref 19–32)
Calcium: 9.4 mg/dL (ref 8.4–10.5)
Glucose, Bld: 127 mg/dL — ABNORMAL HIGH (ref 70–99)
Potassium: 3.4 mEq/L — ABNORMAL LOW (ref 3.5–5.1)
Sodium: 135 mEq/L (ref 135–145)

## 2010-09-16 LAB — GLUCOSE, CAPILLARY
Glucose-Capillary: 149 mg/dL — ABNORMAL HIGH (ref 70–99)
Glucose-Capillary: 194 mg/dL — ABNORMAL HIGH (ref 70–99)
Glucose-Capillary: 213 mg/dL — ABNORMAL HIGH (ref 70–99)

## 2010-09-16 LAB — CBC
MCH: 28.3 pg (ref 26.0–34.0)
MCHC: 32.7 g/dL (ref 30.0–36.0)
MCV: 86.6 fL (ref 78.0–100.0)
Platelets: 420 10*3/uL — ABNORMAL HIGH (ref 150–400)
RBC: 3.67 MIL/uL — ABNORMAL LOW (ref 3.87–5.11)

## 2010-09-17 ENCOUNTER — Ambulatory Visit: Payer: Medicare Other | Admitting: Gastroenterology

## 2010-09-17 LAB — RENAL FUNCTION PANEL
BUN: 52 mg/dL — ABNORMAL HIGH (ref 6–23)
Calcium: 9.1 mg/dL (ref 8.4–10.5)
Glucose, Bld: 136 mg/dL — ABNORMAL HIGH (ref 70–99)
Phosphorus: 4.1 mg/dL (ref 2.3–4.6)
Sodium: 133 mEq/L — ABNORMAL LOW (ref 135–145)

## 2010-09-17 LAB — PROTIME-INR
INR: 2.26 — ABNORMAL HIGH (ref 0.00–1.49)
Prothrombin Time: 25.1 seconds — ABNORMAL HIGH (ref 11.6–15.2)

## 2010-09-17 LAB — GLUCOSE, CAPILLARY: Glucose-Capillary: 130 mg/dL — ABNORMAL HIGH (ref 70–99)

## 2010-09-17 NOTE — Consult Note (Signed)
NAME:  Yolanda Huffman, Yolanda Huffman NO.:  [id number]  MEDICAL RECORD NO.:  [id number]           PATIENT TYPE:  I  LOCATION:  [id number]                         FACILITY:  [facility]  PHYSICIAN:  Loralie Champagne, MD      DATE OF BIRTH:  Jan 21, 1941  DATE OF CONSULTATION:  09/09/2010 DATE OF DISCHARGE:                                CONSULTATION   PRIMARY CARDIOLOGIST:  Champ Mungo. Lovena Le, MD  PRIMARY CARE PHYSICIAN:  Rosalita Chessman, DO  PATIENT PROFILE:  A 70 year old female with prior history of AFib, status pot pacemaker placement, on chronic Coumadin anticoagulation who presented with dyspnea and anemia as well as pneumonia and we have been asked to evaluate.  PROBLEMS: 1. Multifactorial dyspnea. 2. Atrial fibrillation. 3. Status post pacemaker placement. 4. Hypertension. 5. Insulin-dependent diabetes mellitus. 6. Urinary tract infection. 7. Pneumonia. 8. Hypoglycemic episode, prior to admission. 9. GERD. 10.Hyperlipidemia. 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 12.Duodenal ulcers. 13.Coronary artery disease, status post stenting. 14.Presumably ischemic cardiomyopathy, EF 45% to 50% by echo on March     14th.  ALLERGIES:  No known drug allergies.  HISTORY OF PRESENT ILLNESS:  A 70 year old female with above complex problem list.  The patient recently was found to have blood in her stool, resulting in colonoscopy and subsequent EGD, which showed multiple duodenal ulcers.  Further, she was noted to elevated LFTs with discontinuation of her metformin and initiation of insulin therapy.  On the day of admission, the patient was weak and dyspneic as well as diaphoretic and found to have a blood glucose in the 20s.  In the [facility] ED, she was given D50 and subsequently admitted.  Here, she is ruled out for MI.  She has continued to have intermittent dyspnea.  Of note, she is noted to have new-onset renal failure with a creatinine of 2.2.  She had a creatinine of 1 in December.  As a result  of elevated creatinine, her sotalol has been held and we have been asked to evaluate.  Also of note, on presentation, INR was supratherapeutic and Coumadin has been on hold.  The patient still has mild dyspnea with exertion and speech.  CURRENT MEDICATIONS: 1. Guaifenesin 600 mg b.i.d. 2. NovoLog sliding scale. 3. Protonix 40 mg b.i.d. 4. Rocephin 1 g q.24 h. 5. Zithromax 500 mg q.24 h.  FAMILY HISTORY:  Mother died of unknown causes.  Father died of MI at 37.  She has a sister with breast cancer.  SOCIAL HISTORY:  Patient lives in [place] by herself.  She has 1 daughter.  She has a 40-pack-year history of tobacco abuse, smoking 2 packs a day for 20 years, but she has since quit.  No alcohol or drugs.  REVIEW OF SYSTEMS:  Posture for weakness, dyspnea, chronic 2-pillow orthopnea, dry and hacking cough, and occasional wheezing.  She is full code.  Otherwise all systems reviewed and negative.  PHYSICAL EXAMINATION:  VITAL SIGNS:  Temperature 98.1, heart rate 82, respirations 20, blood pressure 168/76, pulse oximetry 100% on room air. GENERAL:  Pleasant white female, in no acute distress; awake, alert, and oriented x3.  She has normal affect. HEENT:  Normal with the exception of dentures.  Nares are grossly intact, nonfocal. SKIN:  Warm and dry without lesions or masses. NECK:  Supple without bruits, JVD. LUNGS:  Respirations are regular and unlabored.  Bibasilar crackles. CARDIAC:  Regular S1 and S2.  No S3, S4, murmurs. ABDOMEN:  Soft, nontender, and bowel sounds present x4. EXTREMITIES: Warm, dry, and pink.  No clubbing, cyanosis, or edema. Dorsalis pedis and posterior tibial pulses 2+ and equal bilaterally.  Chest x-ray on March 14th shows mild bibasilar atelectasis, small pleural effusions bilaterally, no pulmonary edema.  EKG shows A sensed V paced rhythm, no acute ST-T changes.  Hemoglobin 10.2, hematocrit 31.4, WBC 8.1, platelets 340.  Sodium 131, potassium 3.1,  chloride 101, CO2 of 23, BUN 26, creatinine 2.2, blood glucose 114.  Cardiac markers negative x3.  INR 2.25.  ASSESSMENT: 1. Multifactorial dyspnea.  The patient is currently being treated for     pneumonia with some improvement on antibiotics per primary team.     BNP is slightly elevated in the setting of acute renal failure.     Patient does not appear to have excessive volume.  EF of 45% to     50%, noted by echocardiogram.  We are uncertain what prior EF was. 2. Atrial fibrillation.  The patient is currently in sinus rhythm.     Agree with holding sotalol in the setting of renal failure.  We     will change her beta-blocker to Toprol-XL 25 mg b.i.d.  Continue     Coumadin.  We will discontinue aspirin as there is no clear benefit     with combination of 2 with stable coronary disease. 3. Coronary artery disease, stable.  Enzymes are negative. 4. Presumably ischemic cardiomyopathy, EF 45% to 50%.  Changing beta-     blocker to Toprol.  Should avoid     ACE inhibitor/ARB in the setting of acute renal failure. 5. History of duodenal ulcers, continue PPI. 6. Acute renal failure, per primary team.     Murray Hodgkins, ANP   ______________________________ Loralie Champagne, MD    CB/MEDQ  D:  09/09/2010  T:  09/09/2010  Job:  [id number]  Electronically Signed by Murray Hodgkins ANP on 09/13/2010 12:08:50 PM Electronically Signed by Loralie Champagne MD on 09/17/2010 08:37:10 AM

## 2010-09-18 LAB — PROTIME-INR
INR: 3.11 — ABNORMAL HIGH (ref 0.00–1.49)
Prothrombin Time: 32.1 seconds — ABNORMAL HIGH (ref 11.6–15.2)

## 2010-09-18 LAB — BASIC METABOLIC PANEL
Calcium: 9.3 mg/dL (ref 8.4–10.5)
GFR calc Af Amer: 19 mL/min — ABNORMAL LOW (ref >60)
GFR calc non Af Amer: 16 mL/min — ABNORMAL LOW (ref >60)
Sodium: 134 mEq/L — ABNORMAL LOW (ref 135–145)

## 2010-09-18 LAB — GLUCOSE, CAPILLARY: Glucose-Capillary: 138 mg/dL — ABNORMAL HIGH (ref 70–99)

## 2010-09-19 LAB — GLUCOSE, CAPILLARY
Glucose-Capillary: 136 mg/dL — ABNORMAL HIGH (ref 70–99)
Glucose-Capillary: 109 mg/dL — ABNORMAL HIGH (ref 70–99)
Glucose-Capillary: 130 mg/dL — ABNORMAL HIGH (ref 70–99)

## 2010-09-19 LAB — BASIC METABOLIC PANEL
CO2: 21 mEq/L (ref 19–32)
Calcium: 8.9 mg/dL (ref 8.4–10.5)
GFR calc Af Amer: 19 mL/min — ABNORMAL LOW (ref >60)
Sodium: 132 mEq/L — ABNORMAL LOW (ref 135–145)

## 2010-09-19 LAB — CBC
Hemoglobin: 9.2 g/dL — ABNORMAL LOW (ref 12.0–15.0)
MCHC: 31.9 g/dL (ref 30.0–36.0)
RBC: 3.26 MIL/uL — ABNORMAL LOW (ref 3.87–5.11)

## 2010-09-19 LAB — PROTIME-INR
INR: 3.13 — ABNORMAL HIGH (ref 0.00–1.49)
Prothrombin Time: 32.2 seconds — ABNORMAL HIGH (ref 11.6–15.2)

## 2010-09-20 ENCOUNTER — Telehealth: Payer: Self-pay | Admitting: Gastroenterology

## 2010-09-20 LAB — GLUCOSE, CAPILLARY
Glucose-Capillary: 132 mg/dL — ABNORMAL HIGH (ref 70–99)
Glucose-Capillary: 104 mg/dL — ABNORMAL HIGH (ref 70–99)
Glucose-Capillary: 135 mg/dL — ABNORMAL HIGH (ref 70–99)

## 2010-09-20 LAB — BASIC METABOLIC PANEL
BUN: 50 mg/dL — ABNORMAL HIGH (ref 6–23)
CO2: 24 mEq/L (ref 19–32)
Calcium: 9.4 mg/dL (ref 8.4–10.5)
Chloride: 104 mEq/L (ref 96–112)
Creatinine, Ser: 2.96 mg/dL — ABNORMAL HIGH (ref 0.4–1.2)
GFR calc Af Amer: 19 mL/min — ABNORMAL LOW (ref >60)

## 2010-09-20 LAB — PROTIME-INR: INR: 2.72 — ABNORMAL HIGH (ref 0.00–1.49)

## 2010-09-20 NOTE — Telephone Encounter (Signed)
Patient is in the hospital. Patient was sent stool cards in the mail and the daughter wants to know if these can be done while she is in the hospital. Dr. Deatra Ina please advise.

## 2010-09-20 NOTE — Telephone Encounter (Signed)
Spoke with inpt nurse and ordered hemoccult stool cards per Dr. Erskine Emery.

## 2010-09-20 NOTE — Telephone Encounter (Signed)
Ok to submit provided she's not hospitalized for a GI problem

## 2010-09-21 LAB — GLUCOSE, CAPILLARY: Glucose-Capillary: 171 mg/dL — ABNORMAL HIGH (ref 70–99)

## 2010-09-21 LAB — CBC
Platelets: 439 10*3/uL — ABNORMAL HIGH (ref 150–400)
RBC: 3.81 MIL/uL — ABNORMAL LOW (ref 3.87–5.11)
RDW: 16.3 % — ABNORMAL HIGH (ref 11.5–15.5)
WBC: 4.6 10*3/uL (ref 4.0–10.5)

## 2010-09-21 LAB — PROTIME-INR: INR: 2.46 — ABNORMAL HIGH (ref 0.00–1.49)

## 2010-09-21 LAB — RENAL FUNCTION PANEL
Albumin: 2.9 g/dL — ABNORMAL LOW (ref 3.5–5.2)
Calcium: 9.1 mg/dL (ref 8.4–10.5)
Creatinine, Ser: 3 mg/dL — ABNORMAL HIGH (ref 0.4–1.2)
GFR calc Af Amer: 19 mL/min — ABNORMAL LOW (ref >60)
GFR calc non Af Amer: 15 mL/min — ABNORMAL LOW (ref >60)
Phosphorus: 4.9 mg/dL — ABNORMAL HIGH (ref 2.3–4.6)
Sodium: 132 mEq/L — ABNORMAL LOW (ref 135–145)

## 2010-09-21 NOTE — Progress Notes (Signed)
NAME:  Yolanda Huffman, MASTIN NO.:  [id number]  MEDICAL RECORD NO.:  [id number]           PATIENT TYPE:  I  LOCATION:  [id number]                         [facility]ACILITY:  [facility]  PHYSICIAN:  Niel Hummer, MD    DATE OF BIRTH:  [birth year]                                PROGRESS NOTE   DISCHARGE DATE: To be determined.  CURRENT DIAGNOSES: 1. Acute renal failure, possible secondary to hemodynamically mediated     secondary  hypotension on admission; gastrointestinal bleed. 2. Dyspnea secondary to heart failure and pneumonia, improved. 3. Hypoglycemia, resolved. 4. Urinary tract infection, finished treatment. 5. Atrial fibrillation, rate controlled. 6. Anemia secondary to gastrointestinal bleed. 7. Gastrointestinal bleed secondary to duodenal ulcer.  CURRENT MEDICATIONS: 1. Norvasc 5 mg p.o. daily. 2. Lasix 80 mg IV x1. 3. Metoprolol 50 mg daily. 4. Avelox 400 mg daily. 5. Protonix 40 mg p.o. b.i.d. 6. Coumadin per pharmacy. 7. Guaifenesin 600 mg p.o. b.i.d.  RADIOGRAPHIC STUDIES: 1. Chest x-ray:  Mild bibasilar atelectasis, linear density at the     right lung apex, may reflect atelectasis, possible mild pneumonia.     Mild cardiomegaly. 2. Chest x-ray March 13:  Mild pulmonary vascular congestion without     acute findings. 3. Right upper quadrant ultrasound was done and flow was seen in the     gallbladder without evidence of acute cholecystitis.  Enlargement,     bile duct without intrahepatic biliary duct dilation.  Mild     increased echogenicity of the kidneys suggesting medical renal     disease. 4. Chest x-ray:  Mild bibasilar atelectasis.  Interval development of     a small pleural effusion without pulmonary edema. 5. Chest x-ray, portable, March 19:  Cardiomegaly with development of     mild pulmonary venous congestion.  Small bilateral pleural     effusions. 6. Endoscopy September 01, 2010, showed ulcers, multiple in the bulb of     duodenum.  BRIEF HISTORY  OF PRESENT ILLNESS: This is a very pleasant 70 year old who was admitted on March 12, who has a past medical history of AFib on Coumadin, diabetes, and hypertension.  Presented complaining of shortness of breath, feeling weak.  She was seen in the emergency department and she was discharged home with diagnosis of urinary tract infection, discharged on Keflex, but the patient was not able to fill the prescription.  Her metformin was stopped.  Her insulin was increased.  The patient had an endoscopy which showed some duodenal ulcers on March 7.  Of note, the patient has a prior history of gastrointestinal bleed a couple of weeks prior to hospitalization. On the day of admission, the patient was feeling weak, diaphoretic, shortness of breath.  When the patient called EMS and when they arrived to her house, her blood sugar was in the 20s.  She received D50.  Blood sugar repeated in the emergency department was low again and she received D50 again.  Her blood sugar then increased to 121.  HOSPITAL COURSE: 1. Dyspnea.  This is likely multifactorial secondary to anemia,     pneumonia, heart failure.  The patient's shortness of breath has     improved after IV Lasix restarted.  Her hemoglobin is stable now. 2. Hypoglycemia secondary to increased doses of insulin.  At this     time, blood sugar has remained stable.  She is receiving sliding     scale insulin.  We will need to adjust her regimen prior to     discharge home. 3. Urinary tract infection.  The patient received 3 days of IV     ceftriaxone.  Urine culture, no growth. 4. Pneumonia.  The patient was initially treated with ceftriaxone and     azithromycin.  Then, she was transitioned to [place].  Today is     day 8 of 10 of antibiotics. 5. AFib.  Her heart rate has been well controlled.  Cardiology was     consulted to help with anticoagulation in the setting of prior GI     bleed. Cardiology recommend continue with Coumadin and stop      aspirin. 6. Anemia secondary to GI bleed.  Her hemoglobin has remained stable.     We will start ferrous sulfate.  She had an anemia panel that showed     iron less than 10, B12 of 354, folate 8.1, ferritin 85. 7. Acute renal failure, oliguric.  On March 1st, her creatinine was     2.2.  We do not have any other prior records.  On admission, her     creatinine was 2.2.  She received initially IV fluid gentle     hydration.  Her blood pressure on admission was at 80.  With IV     fluid and holding blood pressure medications, her blood pressure     improved.  Her acute renal failure is likely hemodynamically     related secondary to hypotension, anemia, ACE inhibitor use.  Over     the course of hospitalization, even after IV fluids, creatinine     continued to increase.  IV fluid was stopped due to heart failure.     Nephrologist was consulted.  Dr. Florene Glen is helping with the     management of renal failure.  Lasix was started.  We need to     continue to monitor creatinine and urine output.  Complement level     is pending and ANA. 8. Disposition.  We are waiting for stabilization of her renal     failure, she will need a skilled nursing facility.9. Diabetes.  The patient had a hypoglycemic event on admission.  Her     blood sugar at home was in the 20s.  She received D50.  On arrival     to the emergency department, she received D50 again.  At this time,     her blood sugar has been normalized.  She is only receiving sliding     scale insulin.  Prior to discharging her, we will need to adjust     insulin regimen.     Niel Hummer, MD     BR/MEDQ  D:  09/14/2010  T:  09/15/2010  Job:  [id number]  Electronically Signed by Niel Hummer MD on 09/21/2010 03:27:00 PM

## 2010-09-22 LAB — RENAL FUNCTION PANEL
Albumin: 3 g/dL — ABNORMAL LOW (ref 3.5–5.2)
BUN: 44 mg/dL — ABNORMAL HIGH (ref 6–23)
CO2: 22 mEq/L (ref 19–32)
Calcium: 9.3 mg/dL (ref 8.4–10.5)
Chloride: 102 mEq/L (ref 96–112)
Creatinine, Ser: 2.95 mg/dL — ABNORMAL HIGH (ref 0.4–1.2)
GFR calc Af Amer: 19 mL/min — ABNORMAL LOW (ref >60)
GFR calc non Af Amer: 16 mL/min — ABNORMAL LOW (ref >60)
Glucose, Bld: 121 mg/dL — ABNORMAL HIGH (ref 70–99)
Phosphorus: 5.4 mg/dL — ABNORMAL HIGH (ref 2.3–4.6)
Potassium: 4.1 mEq/L (ref 3.5–5.1)
Sodium: 134 mEq/L — ABNORMAL LOW (ref 135–145)

## 2010-09-22 LAB — GLUCOSE, CAPILLARY
Glucose-Capillary: 93 mg/dL (ref 70–99)
Glucose-Capillary: 136 mg/dL — ABNORMAL HIGH (ref 70–99)

## 2010-09-22 LAB — PROTIME-INR
INR: 2.94 — ABNORMAL HIGH (ref 0.00–1.49)
Prothrombin Time: 30.7 seconds — ABNORMAL HIGH (ref 11.6–15.2)

## 2010-09-23 LAB — GLUCOSE, CAPILLARY: Glucose-Capillary: 132 mg/dL — ABNORMAL HIGH (ref 70–99)

## 2010-09-23 NOTE — H&P (Signed)
NAME:  Yolanda Huffman, Yolanda Huffman NO.:  [id number]  MEDICAL RECORD NO.:  [id number]           PATIENT TYPE:  E  LOCATION:  WLED                         FACILITY:  [facility]  PHYSICIAN:  Farris Has, MDDATE OF BIRTH:  1941-04-17  DATE OF ADMISSION:  09/06/2010 DATE OF DISCHARGE:                             HISTORY & PHYSICAL   PRIMARY CARE PROVIDER:  Rosalita Chessman, DO with [place].  CHIEF COMPLAINT:  Clammy, slightly short of breath, feeling weak and unwell.  HISTORY OF PRESENT ILLNESS:  The patient is a 70 year old female with past medical history of atrial fibrillation on Coumadin, diabetes, and hypertension, and blood in stool.  She was seen in the emergency department on the 10th, was diagnosed with urinary tract infection, was sent home with Keflex, but she never filled out the prescription. Recently her insulin has been increased, her metformin was stopped, and she was started on Humulin 10 units after lunch and after supper as well as Levemir 18 units at night.  Metformin was stopped because of worry for her liver function being abnormal.  She has been on chronic Coumadin for her atrial fibrillation when she was here last time and on the 10th her INR was 3.5 and was not rechecked today.  She has had some spontaneous bruising forming around her ankles.  Of note, she has a history of blood in her stool for which she has undergone colonoscopy in November.  There was some question of her having hemorrhoids, but then I do not have the report, unfortunately Dr. Deatra Ina did that study.  She also last Wednesday had an EGD done, which per patient showed no ulcers, but again I have no study report of that.  Today she was just feeling weak, diaphoretic, short of breath although she states that this morning she is not breathing right because she does not feel right.  No fevers, very mild cough.  Otherwise, review of systems was negative.  When she called EMS and they  got there, her blood sugar was in the 20s.  She received some D50.  Her blood sugar was still low when she presented to the emergency department and she was given D50 again.  The latest blood sugar came back up to 121.  Otherwise, no other complaints.  No chest pains, no diarrhea.  No constipation.  No nausea or vomiting.  Review of systems is otherwise negative.  PAST MEDICAL HISTORY:  Significant for: 1. Atrial fibrillation, status post pacemaker placement. 2. Diabetes. 3. GERD. 4. High cholesterol. 5. Hypertension. 6. Recent diagnosis of UTI. 7. History of blood in stool, followed by Dr. Deatra Ina.  SOCIAL HISTORY:  The patient does not smoke or drink or abuse drugs. She was a smoker until 76.  FAMILY HISTORY:  Noncontributory.  ALLERGIES:  None.  MEDICATIONS: 1. Calcium 600 mg daily. 2. Diovan 320/25 mg daily. 3. Sotalol 80 mg b.i.d. 4. Coumadin 2.5 mg daily. 5. Keflex, she actually never received it. 6. Lipitor 20 mg daily. 7. Levemir 18 units at night. 8. Prilosec 20 mg daily. 9. Humulin insulin 10 units twice a day  after meals. 10.Vitamin D3. 11.Fish oil 1000 mg daily. 12.Flomax. 13.Tekturna 300 mg a day. 14.Amlodipine 10 mg daily. 15.Fenofibrate 320 mg daily.  PHYSICAL EXAMINATION:  VITAL SIGNS:  Temperature 98.2; blood pressure 100/61, it has been fluctuating, current 114/58 but has been as low as 80/59 was mainly positional; pulse 73; respirations 20; satting 96% on room air. GENERAL:  The patient appears to be in no acute distress, elderly female. HEENT:  Head: Nontraumatic, pale.  Somewhat dryish mucous membranes. LUNGS:  Clear to auscultation bilaterally.  I do not appreciate any crackles or wheezes. HEART:  Regular rhythm.  No murmurs appreciated. ABDOMEN:  Slightly distended but soft, nontender.  Foley is in place. LOWER EXTREMITIES:  Without clubbing, cyanosis, or edema.  There is a bruise present on the left ankle.  She states was spontaneous and  not due to injury. NEUROLOGICALLY:  Grossly intact.  LABORATORY DATA:  White blood cell count 12.1; hemoglobin 9.3, which is down from 12, which was checked on 10th of March, potassium 3.3.  Sodium 128.  Of note, the patient since December have had sodiums in the low 130s to 120s range; creatinine 2.51, which is slightly up from creatinine on 10th which was 2, which is around her baseline.  AST 76, ALT 46, albumin 2.9, D-dimer slightly elevated 1.47.  INR not done. Chest x-ray showed mild atelectasis versus pneumonia.  EKG is paced.  ASSESSMENT/PLAN:  This is a 70 year old female with multiple medical problems presents with shortness of breath and clamminess: 1. Shortness of breath.  This is likely multifactorial.  She is anemic     more so than before.  We will get type and screen, Hemoccult stool.     Hold her Coumadin for now.  Consider GI consult to discuss with Dr.     Deatra Ina what kind of study she has done so for if she needs anything     repeated or versus if she needs a small bowel followthrough given     her anemia.  We will transfuse if hemoglobin goes below 8.  We will     put on Protonix IV, although she has had a recent EGD, which was     reassuring to palpation.  We will also do frequent CBCs.  Other     etiologies for shortness of breath could be pneumonia versus     atelectasis.  This is fairly mild, but for right now cover Rocephin     and azithromycin.  Get repeat chest x-ray to see if it is actually     a true pneumonia or not.  If this clears likely this was never a     pneumonia. 2. Also, given the fact that the patient is diabetic.  We will cycle     cardiac markers and repeat EKG. 3. Urinary tract infection, cover Rocephin.  Await urine culture. 4. Transient hypotension.  This was positional and currently resolved.     We will continue to monitor carefully, give IV fluids. 5. Low sodium.  This is to be more of a chronic.  We will check TSH.     Check urine  lytes and check cortisol level.  We will attempt to     give some IV fluids as the patient does seem to be somewhat     dehydrated.  Hold hydrochlorothiazide. 6. Creatinine increased.  The patient may have chronic kidney disease     of which she was unaware of, may need a renal consult or  follow up     as an outpatient.  Right now, we will give gentle IV fluids.     Obtain abdominal ultrasound. 7. Elevated LFTs, unknown etiology.  Her metformin was stopped.  We     will also hold Lipitor.  Obtain abdominal ultrasound.  Her abdomen     is nontender. 8. History of atrial fibrillation.  Continue sotalol.  She is paced.     Hold Coumadin for right now until we can figure out if she is     actively bleeding or note and what is her INR.  Concern her     shortness of breath if INR is subtherapeutic, and now possibility     with BP, although less likely.  We will check a VQ scan if     subtherapeutic INR. 9. Low potassium, we will replace. 10.Gastroesophageal reflux disease.  Continue Protonix. 11.Hypoglycemia.  I think that is explanation of her feeling clammy     likely secondary to recent increase in her insulin.  Hold insulin     for right now.  Continue to follow blood sugars closely.  Give D5     and normal saline and IV fluids until blood sugar stabilizes or     becomes elevated at which point she could be switched back to     normal saline. 12.Prophylaxis.  Protonix and SCDs until know what her INR is. 13.Code status.  The patient wished to be full code.     Farris Has, MD     AVD/MEDQ  D:  09/06/2010  T:  09/06/2010  Job:  [id number]  cc:   Rosalita Chessman, DO [place], [place] [id number]  Electronically Signed by Toy Baker MD on 09/23/2010 02:42:39 AM

## 2010-09-27 NOTE — Consult Note (Signed)
NAME:  WILMER, FLANNIGAN NO.:  [id number]  MEDICAL RECORD NO.:  [id number]           PATIENT TYPE:  I  LOCATION:  [id number]                         [facility]ACILITY:  [facility]  PHYSICIAN:  Young Brim C. Florene Glen, M.D.  DATE OF BIRTH:  05/26/1941  DATE OF CONSULTATION: DATE OF DISCHARGE:                                CONSULTATION   I was asked by Dr. Clent Jacks to see this 70 year old female with complaints of shortness of breath in [facility] on September 06, 2010.  Exact etiology of her shortness of breath is uncertain, possibly related to pneumonia versus anemia versus atrial fibrillation versus congestive heart failure.  The patient had transient hypotension at one point during the hospitalization 80/59 given intravenous fluids and chest x-ray on admission showed questionable pneumonia and is worsened to show evidence of bilateral pleural effusions.  Serum creatinine was 2.24 mg/dL on September 06, 2010; 2.2 mg/dL on August 26, 2010, and 1.0 mg/dL on June 03, 2010, and 1.0 mg/dL on February 23, 2010.  Serum creatinine today is of 2.75 mg/dL.  Renal consultation has been requested.  Of note, the patient has prior gastrointestinal bleeding couple weeks prior to the hospitalization requiring transfusion of packed red blood cells. Renal ultrasound on September 07, 2010, showed echogenic kidneys bilaterally with normal size, gallbladder stone, gallbladder with stone and sludge, 2-D echocardiogram showed ejection fraction of 45-50% with mild left ventricular hypertrophy and pulmonary artery pressure of 44 mg/dL with PA pressure of 44.  Urinalysis shows 13 mg/dL, protein specific gravity 1.016, pH 6.0, 21-50 white blood cells, too numerous to count red blood cells and culture revealed no growth.  PAST HISTORY:  Remarkable for atrial fibrillation, status post pacemaker placement, diabetes mellitus, GERD, dyslipidemia, hypertension, and history of GI bleeding.  SOCIAL HISTORY:  She quit smoking  in September 1993.  Does not consume alcoholic beverages.  She is retired.  CURRENT MEDICATIONS:  Amlodipine, insulin, Xopenex, Toprol, Avelox, Protonix, warfarin, hydralazine.  Prior to admission, the patient was taking Diovan and Tekturna.  PHYSICAL EXAMINATION:  VITAL SIGNS:  Blood pressure is 145/75, temperature is 98.3, O2 sat is 96%.  She is very pleasant, pale overweight Caucasian female. HEENT:  Neck veins are not distended, not bulging. LUNGS:  Decreased breath sounds. HEART:  Irregular. ABDOMEN:  Tender right upper quadrant. EXTREMITIES:  A 1+ bilateral pitting edema with trace presacral edema. NEUROLOGIC:  No focality.  LABORATORY DATA:  Sodium 134, potassium 3.9, chloride 105, CO2 of 22, BUN 45, creatinine 2.75, hemoglobin 9.2, platelet count 338,000.  ASSESSMENT: 1. Acute renal failure (serum creatinine 1.0 mg/dL on December 2011)     etiology of the acute kidney injury is most likely hemodynamically     mediated secondary to ACE inhibitor therapy and random inhibitor on     board,     hypotension, gastrointestinal bleeding, and congestive heart     failure.  Recommendations include aggressive treatment of     congestive heart failure, to optimize renal perfusion. 2. We will initiate therapy with furosemide intravenously.          ______________________________ Darrold Span  Florene Glen, M.D.     ACP/MEDQ  D:  09/13/2010  T:  09/14/2010  Job:  [id number]  Electronically Signed by Erling Cruz M.D. on 09/27/2010 04:39:09 PM

## 2010-09-30 NOTE — Progress Notes (Signed)
NAME:  Yolanda Huffman, Yolanda Huffman NO.:  [id number]  MEDICAL RECORD NO.:  [id number]           PATIENT TYPE:  I  LOCATION:  [id number]                         FACILITY:  [place]  PHYSICIAN:  Sheila Oats, M.D.DATE OF BIRTH:  01-Apr-1941                                PROGRESS NOTE   This progress note spans the period from September 16, 2010, through September 21, 2010.  CURRENT DIAGNOSES: 1. Acute gout - improving on colchicine. 2. Acute renal failure - possibly secondary to hypotension on     admission/gastrointestinal bleed in the setting of ARB. 3. Dyspnea - secondary to heart failure and pneumonia - the patient     status post completion of antibiotics and Lasix on hold at this     time secondary to worsening creatinine. 4. Hypoglycemia - resolved. 5. Urinary tract infection - status post antibiotics. 6. Atrial fibrillation - rate controlled. 7. Anemia secondary to gastrointestinal bleed. 8. Duodenal ulcer. 9. Deconditioning.  ADDENDUM TO CONSULTATIONS: Dr. Marval Regal with Renal continued to follow the patient during this period.  ADDENDUM TO PROCEDURES AND STUDIES: 1. X-rays of right foot on March 22 - negative for gout.  First MTP     osteoarthritis.  No fracture or other acute abnormality. 2. Left hand x-ray - no acute findings.  Severe first carpometacarpal     osteoarthritis. 3. Uric acid level elevated at 8.6.  ADDENDUM TO THE HOSPITAL COURSE: 1. Acute gout - the patient developed pain and swelling in her right     great toe and across her metatarsal joint.  X-rays of her right     foot was done and the results as stated above.  A uric acid level     was done and it was elevated at 8.6.  The patient stated that she     had had similar attacks before, but that she always took over-the-     counter medications.  She was started on colchicine - renally     adjusted dose and her symptoms have improved - she is able to     ambulate better again. 2. Acute Kidney injury  - at the beginning of the period that this     dictation spans, the patient's creatinine was 2.97 and it improved     slightly to 2.90 on March 25, Renal followed the patient and     restarted her on Lasix on March 24.  The patient's creatinine     gradually has increased back to 3.0 today.  Renal is following the     patient, we will hold Lasix for now, follow on recheck. 3. GI bleed/acute blood loss anemia - the patient's hemoglobin     remained stable during this period and she has had no further     bleeding.  A Hemoccult was done and came back negative today.  Her     hemoglobin is 10.8 with a hematocrit of 33.8. 4. Pneumonia - the patient completed the full course of antibiotics -     discontinued on March 26.  She will not require any further  antibiotics upon discharge. 5. Congestive heart failure - the patient was diuresed with IV Lasix     and her dyspnea resolved.  Renal followed and restarted p.o. Lasix,     but as discussed above, it has been put on hold secondary to     worsening creatinine.  CHF is compensated at this time. 6. Deconditioning - PT, OT was consulted, followed the patient,     recommended skilled nursing for rehab.  The rounding hospitalist to continue to follow the patient and dictate the rest of her hospital stay as well as her final discharge medications.     Sheila Oats, M.D.     ACV/MEDQ  D:  09/21/2010  T:  09/21/2010  Job:  [id number]  Electronically Signed by Minette Headland M.D. on 09/30/2010 08:35:29 AM

## 2010-10-07 NOTE — Discharge Summary (Signed)
NAME:  Yolanda Huffman, Yolanda Huffman NO.:  [id number]  MEDICAL RECORD NO.:  [id number]           PATIENT TYPE:  I  LOCATION:  [id number]                         FACILITY:  [facility]  PHYSICIAN:  Bonnielee Haff, MD     DATE OF BIRTH:  Dec 17, 1940  DATE OF ADMISSION:  09/06/2010 DATE OF DISCHARGE:  09/23/2010                        DISCHARGE SUMMARY - REFERRING   PRIMARY CARE PHYSICIAN: 1. Rosalita Chessman, DO at [facility] during this hospitalization.  The     patient was seen in consultation by Dr. Aundra Dubin with [facility]     Cardiology. 2. Dr. Erling Cruz and Dr. Marval Regal from Nephrology.  The patient also seen by gastroenterology at [facility].  Imaging studies done during this hospitalization include: 1. Chest x-ray which showed mild bibasilar atelectasis with linear     density at the right lung apex, possibly a mild pneumonia. 2. Chest x-ray, March 13, which showed mild pulmonary vascular     congestion without acute findings. 3. Ultrasound of the abdomen showed stones, sludge, and gallbladder     without any acute inflammation.  Medical renal disease was seen,     trace free fluid in the perisplenic area. 4. Chest x-ray, March 14, showed bibasilar atelectasis and development     of bilateral pleural effusion.  Chest x-ray on March 19 showed mild     pulmonary venous congestion with small bilateral pleural effusions.     X-ray of the right foot on March 22, which shows first MTP     arthritis.  No fractures and x-ray of the left hand on March 22     showed no acute findings, severe first CMC osteoarthritis was seen.  Pertinent labs include admission hemoglobin of 7.7.  She was transfused subsequently.  Hemoglobin yesterday was 10.8.  Baseline BUN of 49, creatinine of 2.2 stabilizing between 40 and 50.  BUN and creatinine between 2.5 and 3.  She was also hyperkalemic which was repleted.  HbA1c 5.9.  Cardiac enzymes were negative.  Lipid panel was unremarkable.  TSH was 0.689.  Iron  was less than 10, B12 354.  Cortisol 22.3.  Urine osmolality 352, T3 and T4 156 and 25 respectively.  Helicobacter pylori was negative and MRSA was negative.  Blood cultures were negative. Urine cultures did not show any growth.  ANA was positive.  Titer was 1:40 speckled pattern.  The occult blood testing in the feces was negative.  INR today is 2.94.  DISCHARGE DIAGNOSES: 1. Anemia likely secondary to acute blood loss, improved status post     transfusion. 2. Acute gout, improved. 3. Acute on chronic renal failure, stable. 4. Dyspnea, likely secondary to pneumonia, improved. 5. Urinary tract infection, status post antibiotics. 6. Atrial fibrillation, rate controlled, and on Coumadin. 7. Duodenal ulcers, on PPI. 8. Deconditioning.  BRIEF HOSPITAL COURSE:  Briefly, this is a 70 year old Caucasian female who presented to the hospital with complaints of shortness of breath. She was found to have anemia.  Plan: 1. Anemia likely secondary to acute blood loss.  She was found to have     duodenal ulcers on endoscopy on March  7.  This was done by Dr.     Deatra Ina.  Because of the concerns of bleeding, ulceration, GI was     consulted.  She was put on PPI b.i.d.  GI did not want to re-scope     her as there was no active bleeding.  The patient was transfused     blood and she improved spontaneously. 2. Duodenal ulcers, were treated with PPI. 3. Acute renal failure was seen during this admission which was     thought to be due to a combination of anemia as well as ARB and low     blood pressure initially when she came into the hospital.  However,     her renal function did not improve much, but it stabilized with a     creatinine between 2.5 and 3.  Nephrology will follow this patient     as an outpatient. 4. Acute gout was noted few days ago.  She was started on colchicine     and she has shown significant improvement. 5. Atrial fibrillation, rate is controlled.  She is on metoprolol  now.     She was seen by [facility].  Coumadin was reinitiated after     discussions with GI.  INR 2.9 today.  Coumadin will be held today     and tomorrow and can be resumed at a lower dose day after tomorrow     that is on the 30th.  Protocol to be followed.  INR to be kept     between 2 and 3. 6. Hypertension, stable.  Continue with her current hypertensive     medication except for the ARB. 7. Diabetes.  She had episodes of hypoglycemia during this     hospitalization but those are resolved.  Blood sugars now     occasionally reaching 200.  We will resume her Levemir at much     lower dose. 8. She also was found to be dyspneic at the time of admission which     was thought to be secondary to pneumonia and she has completed a     course of antibiotics.  She has a history of CHF and because of     renal failure, she went into mild fluid overload with development     of pleural effusions.  She was started on Lasix and she has shown     improvement.  An echocardiogram was done which showed EF to be 45-     50%.  Wall motion was normal.  Deconditioning, the patient will be discharged to skilled nursing facility for rehabilitation.  Today, that is on March 28, the patient is feeling well.  Denies any complaints.  Her foot is much better.  She is able to ambulate with no difficulties.  Her vital signs are all stable.  She is saturating 96% on room air.  Blood pressure this morning is 150/76; temperature, she is afebrile.  Heart rate is in the 60s.  Lungs are clear to auscultation bilaterally with no wheezing, rales, or rhonchi.  Cardiovascular, S1, S2, regular.  No S3, S4, rubs, murmurs, or bruit.  Abdomen is soft, nontender, nondistended.  Extremities show no edema.  There is mild erythema in the left foot which is improving.  Essentially, the patient is stable for discharge.  She will be going to skilled nursing facility which will be tomorrow morning.  DISCHARGE  MEDICATIONS: 1. Colchicine 0.6 mg tablet, half tablet daily for 7 days and then  as     needed for pain. 2. Lasix 40 mg daily. 3. Xopenex 0.63 mg every 4 hours as needed. 4. Metoprolol XL 50 mg in the morning and 25 mg at bedtime. 5. Oxycodone 5 mg 1 to 2 tablets every 6 hours as needed for pain. 6. Protonix 40 mg p.o. b.i.d. 7. Levemir 5 units subcu q.a.m. 8. Tekturna 150 mg p.o. daily. 9. Amlodipine 10 mg p.o. daily. 10.Calcium carbonate/vitamin D over-the-counter 1 tablet p.o. b.i.d. 11.Fenofibrate micronized 134 mg at bedtime. 12.Fish oil over-the-counter 1 tablet 3 times a day. 13.Iron over-the-counter 1 tablet daily. 14.Lipitor 20 mg daily at bedtime. 15.Vitamin D3, 400 units daily. 16.Warfarin to be started on March 30 at 2.5 mg daily.  We have     discontinued few of her medications including sotalol, magnesium     chloride, Diovan HCT, Aleve, cephalexin, and Humulin N.  FOLLOWUP: 1. She will follow up with Dr. Marval Regal, April 30 at 9:30 a.m. 2. Please arrange follow up with primary care physician, Dr. Etter Sjogren. 3. Diet, modified carbohydrate, renal. 4. Physical Therapy per rehab.  Please check PT/INR on March 31 and then per protocol, please check CBG 3 times a day before meals.  Blood sugar is less than 100 in the morning, please do not give the Levemir.  Please check blood pressures once daily at the very least for the next 1 week.  Please note, total time on this discharge encounter is 35 minutes.     Bonnielee Haff, MD     GK/MEDQ  D:  09/22/2010  T:  09/22/2010  Job:  [id number]  cc:   Rosalita Chessman, DO [place], [place] [id number]  Donato Heinz, M.D. Fax: [phone]  Sandy Salaam. Deatra Ina, MD,FACG [address] [address]  [place], [place] [address]  Electronically Signed by Bonnielee Haff MD on 10/07/2010 10:55:21 PM

## 2010-10-11 ENCOUNTER — Other Ambulatory Visit: Payer: Self-pay | Admitting: Gastroenterology

## 2010-10-11 ENCOUNTER — Other Ambulatory Visit: Payer: Medicare Other

## 2010-10-11 ENCOUNTER — Ambulatory Visit: Payer: Medicare Other | Admitting: Gastroenterology

## 2010-10-11 DIAGNOSIS — D649 Anemia, unspecified: Secondary | ICD-10-CM

## 2010-10-13 ENCOUNTER — Encounter: Payer: Self-pay | Admitting: *Deleted

## 2010-10-13 ENCOUNTER — Telehealth: Payer: Self-pay | Admitting: Internal Medicine

## 2010-10-13 NOTE — Telephone Encounter (Signed)
RN attempted to return call, left message on answering machine.

## 2010-10-14 ENCOUNTER — Telehealth: Payer: Self-pay | Admitting: Internal Medicine

## 2010-10-14 ENCOUNTER — Ambulatory Visit (INDEPENDENT_AMBULATORY_CARE_PROVIDER_SITE_OTHER): Payer: Medicare Other | Admitting: Internal Medicine

## 2010-10-14 ENCOUNTER — Encounter: Payer: Self-pay | Admitting: Internal Medicine

## 2010-10-14 VITALS — BP 110/70 | HR 80 | Ht 63.0 in | Wt 169.0 lb

## 2010-10-14 DIAGNOSIS — I2510 Atherosclerotic heart disease of native coronary artery without angina pectoris: Secondary | ICD-10-CM

## 2010-10-14 DIAGNOSIS — I4891 Unspecified atrial fibrillation: Secondary | ICD-10-CM

## 2010-10-14 DIAGNOSIS — E785 Hyperlipidemia, unspecified: Secondary | ICD-10-CM

## 2010-10-14 DIAGNOSIS — I498 Other specified cardiac arrhythmias: Secondary | ICD-10-CM

## 2010-10-14 DIAGNOSIS — Z950 Presence of cardiac pacemaker: Secondary | ICD-10-CM

## 2010-10-14 MED ORDER — ATORVASTATIN CALCIUM 20 MG PO TABS: 20.0000 mg | ORAL_TABLET | Freq: Every day | ORAL | Status: DC

## 2010-10-14 NOTE — Patient Instructions (Signed)
Your physician wants you to follow-up in: 6 months with device clinic. You will receive a reminder letter in the mail two months in advance. If you don't receive a letter, please call our office to schedule the follow-up appointment. Your physician has recommended you make the following change in your medication: Take 1/2 tablet of Simvastatin until gone then resume Lipitor 20mg  1 tablet daily.

## 2010-10-14 NOTE — Telephone Encounter (Signed)
Wanted to make sure we were aware on Norvasc 10mg  daily and Zocor 40mg  this is a level one drug alert

## 2010-10-14 NOTE — Assessment & Plan Note (Signed)
She denies any anginal symptoms today. She will continue her current medications.

## 2010-10-14 NOTE — Assessment & Plan Note (Signed)
Her device is working normally. Will recheck in several months. 

## 2010-10-14 NOTE — Assessment & Plan Note (Signed)
The patient has been placed on simvastatin. There is an interaction with her amlodipine. I have asked that she breaks simvastatin in half to 20 mg a day. When her current simvastatin prescription ends I have asked her to start taking Lipitor 20 mg a day.

## 2010-10-14 NOTE — Progress Notes (Signed)
HPI [name] returns today for followup. She is a pleasant 70 year old woman with a history of atrial fibrillation, hypertension and dyslipidemia.she was recently hospitalized with pneumonia and after a long hospital stay discharged to a rehabilitation facility. She was discharged from rehabilitation yesterday. She denies chest pain or shortness of breath. No palpitations. No peripheral edema. She has had no fevers or chills. Not on File   Current Outpatient Prescriptions  Medication Sig Dispense Refill  . aliskiren (TEKTURNA) 300 MG tablet Take 300 mg by mouth daily.        Marland Kitchen amLODipine (NORVASC) 10 MG tablet Take 10 mg by mouth daily.        . calcium carbonate (OS-CAL) 600 MG TABS Take 600 mg by mouth daily.        . Cholecalciferol (VITAMIN D) 400 UNIT/ML LIQD Take by mouth.        . colchicine 0.6 MG tablet Take 0.6 mg by mouth daily.        . fish oil-omega-3 fatty acids 1000 MG capsule Take 2 g by mouth daily.        . furosemide (LASIX) 20 MG tablet Take 20 mg by mouth daily.        . insulin detemir (LEVEMIR) 100 UNIT/ML injection Inject into the skin at bedtime.        . levalbuterol (XOPENEX) 0.63 MG/3ML nebulizer solution Take 1 ampule by nebulization every 4 (four) hours as needed.        . metoprolol (TOPROL-XL) 50 MG 24 hr tablet Take 50 mg by mouth daily.        . metoprolol succinate (TOPROL-XL) 25 MG 24 hr tablet Take 25 mg by mouth daily.        Marland Kitchen omeprazole (PRILOSEC OTC) 20 MG tablet Take 20 mg by mouth daily.        Marland Kitchen oxyCODONE (ROXICODONE) 5 MG immediate release tablet Take 5 mg by mouth every 4 (four) hours as needed.        . warfarin (COUMADIN) 5 MG tablet Take 5 mg by mouth daily. As directed       . DISCONTD: simvastatin (ZOCOR) 40 MG tablet Take 40 mg by mouth at bedtime.        Marland Kitchen atorvastatin (LIPITOR) 20 MG tablet Take 1 tablet (20 mg total) by mouth daily.  30 tablet  11  . DISCONTD: aspirin 81 MG tablet Take 81 mg by mouth daily.        Marland Kitchen DISCONTD:  atorvastatin (LIPITOR) 20 MG tablet Take 20 mg by mouth daily.        Marland Kitchen DISCONTD: fenofibrate micronized (LOFIBRA) 134 MG capsule Take 134 mg by mouth daily before breakfast.        . DISCONTD: ferrous sulfate dried (SLOW FE) 160 (50 FE) MG TBCR Take 160 mg by mouth daily.        Marland Kitchen DISCONTD: insulin regular (HUMULIN R) 100 UNIT/ML injection Inject into the skin. 10 units before lunch and 10 units before dinner       . DISCONTD: magnesium chloride (SLOW-MAG) 64 MG TBEC Take by mouth.        . DISCONTD: metFORMIN (GLUMETZA) 500 MG (MOD) 24 hr tablet Take 500 mg by mouth daily with breakfast.        . DISCONTD: naproxen sodium (ANAPROX) 220 MG tablet Take 220 mg by mouth 2 (two) times daily with a meal.        . DISCONTD: phytonadione (MEPHYTON) 5 MG tablet  Take 5 mg by mouth once.        Marland Kitchen DISCONTD: sotalol (BETAPACE) 80 MG tablet Take 80 mg by mouth 2 (two) times daily.        Marland Kitchen DISCONTD: valsartan-hydrochlorothiazide (DIOVAN-HCT) 320-25 MG per tablet Take 1 tablet by mouth daily.           Past Medical History  Diagnosis Date  . Diabetes mellitus   . Hyperlipemia   . Hypertension   . CAD (coronary artery disease)   . Pacemaker   . Atrial fibrillation     ROS:   All systems reviewed and negative except as noted in the HPI.   Past Surgical History  Procedure Date  . Pacemaker insertion   . Cataract extraction   . Stent implant      Family History  Problem Relation Age of Onset  . Hypertension    . Heart disease    . Breast cancer       History   Social History  . Marital Status: Widowed    Spouse Name: N/A    Number of Children: N/A  . Years of Education: N/A   Occupational History  . retired [employer]    Social History Main Topics  . Smoking status: Former Research scientist (life sciences)  . Smokeless tobacco: Not on file  . Alcohol Use: No  . Drug Use: No  . Sexually Active: Not on file   Other Topics Concern  . Not on file   Social History Narrative  . No narrative on file      BP 110/70  Pulse 80  Ht 5\' 3"  (1.6 m)  Wt 169 lb (76.658 kg)  BMI 29.94 kg/m2  Physical Exam:  Well appearing NAD HEENT: Unremarkable Neck:  No JVD, no thyromegally Lymphatics:  No adenopathy Back:  No CVA tenderness Lungs:  Clear. Well-healed pacemaker incision. HEART:  Regular rate rhythm, no murmurs, no rubs, no clicks Abd:  Flat, positive bowel sounds, no organomegally, no rebound, no guarding Ext:  2 plus pulses, no edema, no cyanosis, no clubbing Skin:  No rashes no nodules Neuro:  CN II through XII intact, motor grossly intact  DEVICE  Normal device function.  See PaceArt for details.   Assess/Plan:

## 2010-10-14 NOTE — Telephone Encounter (Signed)
Yolanda Huffman states Pt can not take both meds zocor and norvasc. Yolanda Huffman has question re meds

## 2010-10-14 NOTE — Assessment & Plan Note (Signed)
Her sotalol was discontinued while she was in the hospital. I suspect this was due to renal insufficiency. She is now back in atrial fibrillation but is for the most part asymptomatic. We will continue a strategy of rate control.

## 2010-10-14 NOTE — Telephone Encounter (Signed)
Apt today  Dr Lovena Le can order if he needs

## 2010-10-19 ENCOUNTER — Telehealth: Payer: Self-pay | Admitting: *Deleted

## 2010-10-19 NOTE — Telephone Encounter (Signed)
Pamala Hurry notified and asked that I call the patient directly. I called the pt, Left message on machine to call the office.

## 2010-10-19 NOTE — Telephone Encounter (Signed)
Pamala Hurry called to notify of pt PT/INR results. PT is 20.3 and INR is 1.7. She notes that the pt is taking 2.5mg  daily (no alternating dosing). Please advise.

## 2010-10-19 NOTE — Telephone Encounter (Signed)
Spoke with patient and advised her to take 5 mg today and 2.5 all the other days and we will recheck in 1 week... She voiced understanding to all, unsure if PT check will be done in office, I advised if [facility] does not come out to her then she can call us to schedule, she agreed.      KP

## 2010-10-19 NOTE — Telephone Encounter (Signed)
Take 5mg  today and 2.5 all other days---recheck 1 week

## 2010-10-26 DIAGNOSIS — K279 Peptic ulcer, site unspecified, unspecified as acute or chronic, without hemorrhage or perforation: Secondary | ICD-10-CM

## 2010-10-26 HISTORY — DX: Peptic ulcer, site unspecified, unspecified as acute or chronic, without hemorrhage or perforation: K27.9

## 2010-11-01 ENCOUNTER — Telehealth: Payer: Self-pay | Admitting: *Deleted

## 2010-11-01 NOTE — Telephone Encounter (Signed)
Pt is being discharge from homecare Occupational therapy. It is there recommendation that Pt continue with outpatient therapy and schedule a f/u with Dr Etter Sjogren since Pt has not been seen since being discharge from [facility].  Pt hand function is improving. Pt was though to have complex regional pain syndrome due to redness, swelling, and shinny  skin and hypersensitivity in hand.

## 2010-11-01 NOTE — Telephone Encounter (Signed)
Ok to con't therapy---does she have f/u appointment?

## 2010-11-02 NOTE — Telephone Encounter (Signed)
mssg left for Kim(therapist)  to call the office     KP

## 2010-11-02 NOTE — Telephone Encounter (Signed)
Spoke with nurse info was a Pharmacist, hospital. Nurse states that she is really trying to urge Pt to schedule hosp f/u with dr Etter Sjogren so that she can evaluate Pt and refer Pt for out patient therapy.  Spoke with Pt will check with her daughter to see when she is available to bring her in and will schedule OV according to her schedule.

## 2010-11-12 ENCOUNTER — Encounter: Payer: Self-pay | Admitting: Family Medicine

## 2010-11-12 ENCOUNTER — Ambulatory Visit (INDEPENDENT_AMBULATORY_CARE_PROVIDER_SITE_OTHER): Payer: Medicare Other | Admitting: Family Medicine

## 2010-11-12 VITALS — BP 122/70 | HR 94 | Temp 98.0°F | Wt 174.2 lb

## 2010-11-12 DIAGNOSIS — K269 Duodenal ulcer, unspecified as acute or chronic, without hemorrhage or perforation: Secondary | ICD-10-CM | POA: Insufficient documentation

## 2010-11-12 DIAGNOSIS — D649 Anemia, unspecified: Secondary | ICD-10-CM

## 2010-11-12 DIAGNOSIS — E785 Hyperlipidemia, unspecified: Secondary | ICD-10-CM

## 2010-11-12 DIAGNOSIS — N390 Urinary tract infection, site not specified: Secondary | ICD-10-CM

## 2010-11-12 DIAGNOSIS — D62 Acute posthemorrhagic anemia: Secondary | ICD-10-CM

## 2010-11-12 DIAGNOSIS — I4891 Unspecified atrial fibrillation: Secondary | ICD-10-CM

## 2010-11-12 DIAGNOSIS — J189 Pneumonia, unspecified organism: Secondary | ICD-10-CM

## 2010-11-12 LAB — CBC WITH DIFFERENTIAL/PLATELET
Eosinophils Relative: 1.8 % (ref 0.0–5.0)
HCT: 33.4 % — ABNORMAL LOW (ref 36.0–46.0)
Lymphs Abs: 0.9 10*3/uL (ref 0.7–4.0)
Monocytes Relative: 6.4 % (ref 3.0–12.0)
Neutrophils Relative %: 80.4 % — ABNORMAL HIGH (ref 43.0–77.0)
Platelets: 239 10*3/uL (ref 150.0–400.0)
WBC: 8.2 10*3/uL (ref 4.5–10.5)

## 2010-11-12 LAB — POCT URINALYSIS DIPSTICK
Bilirubin, UA: NEGATIVE
Glucose, UA: NEGATIVE
Ketones, UA: NEGATIVE
Protein, UA: 100
pH, UA: 5

## 2010-11-12 LAB — PROTIME-INR
INR: 1.4 ratio — ABNORMAL HIGH (ref 0.8–1.0)
Prothrombin Time: 15.5 s — ABNORMAL HIGH (ref 10.2–12.4)

## 2010-11-12 MED ORDER — PANTOPRAZOLE SODIUM 40 MG PO TBEC: 40.0000 mg | DELAYED_RELEASE_TABLET | Freq: Every day | ORAL | Status: DC

## 2010-11-12 MED ORDER — FENOFIBRATE MICRONIZED 134 MG PO CAPS: 134.0000 mg | ORAL_CAPSULE | Freq: Every day | ORAL | Status: DC

## 2010-11-12 MED ORDER — METOPROLOL SUCCINATE ER 50 MG PO TB24: 50.0000 mg | ORAL_TABLET | Freq: Every day | ORAL | Status: DC

## 2010-11-12 MED ORDER — ATORVASTATIN CALCIUM 20 MG PO TABS: 20.0000 mg | ORAL_TABLET | Freq: Every day | ORAL | Status: DC

## 2010-11-12 MED ORDER — FUROSEMIDE 20 MG PO TABS: 20.0000 mg | ORAL_TABLET | Freq: Every day | ORAL | Status: DC

## 2010-11-12 MED ORDER — METOPROLOL SUCCINATE ER 25 MG PO TB24: 25.0000 mg | ORAL_TABLET | Freq: Every day | ORAL | Status: DC

## 2010-11-12 MED ORDER — OXYCODONE HCL 5 MG PO TABS: 5.0000 mg | ORAL_TABLET | ORAL | Status: DC | PRN

## 2010-11-12 MED ORDER — WARFARIN SODIUM 5 MG PO TABS: 5.0000 mg | ORAL_TABLET | Freq: Every day | ORAL | Status: DC

## 2010-11-12 MED ORDER — METOPROLOL SUCCINATE ER 25 MG PO TB24: ORAL_TABLET | ORAL | Status: DC

## 2010-11-12 MED ORDER — ALISKIREN FUMARATE 300 MG PO TABS: 300.0000 mg | ORAL_TABLET | Freq: Every day | ORAL | Status: DC

## 2010-11-12 MED ORDER — CIPROFLOXACIN HCL 500 MG PO TABS: 500.0000 mg | ORAL_TABLET | Freq: Two times a day (BID) | ORAL | Status: AC

## 2010-11-12 NOTE — Progress Notes (Signed)
  Subjective:    Patient ID: Yolanda Huffman, female    DOB: 1941/02/18, 70 y.o.   MRN: [id number]  HPI  Pt here to f/u from [facility] admission for uti, dehydration, anemia, PUD, and pneumonia.  Pt is feeling much better now.  No complaints.  Review of Systems As above See hospital admit and d/c summary    Objective:   Physical Exam  Constitutional: She appears well-developed and well-nourished.  Cardiovascular:       Irreg, irreg  Pulmonary/Chest: Effort normal and breath sounds normal. No respiratory distress. She has no wheezes.  Musculoskeletal: She exhibits no edema.  Skin: Skin is warm and dry.  Psychiatric: She has a normal mood and affect. Her behavior is normal.          Assessment & Plan:

## 2010-11-12 NOTE — Assessment & Plan Note (Signed)
Check urine culture  

## 2010-11-12 NOTE — Assessment & Plan Note (Signed)
con't meds Check cbc F/u GI

## 2010-11-12 NOTE — Assessment & Plan Note (Signed)
con't protonix F/u GI

## 2010-11-12 NOTE — Assessment & Plan Note (Signed)
Resolved

## 2010-11-14 LAB — URINE CULTURE

## 2010-11-19 ENCOUNTER — Ambulatory Visit (INDEPENDENT_AMBULATORY_CARE_PROVIDER_SITE_OTHER): Payer: Medicare Other | Admitting: *Deleted

## 2010-11-19 ENCOUNTER — Ambulatory Visit: Payer: Medicare Other

## 2010-11-19 DIAGNOSIS — N390 Urinary tract infection, site not specified: Secondary | ICD-10-CM

## 2010-11-19 LAB — POCT URINALYSIS DIPSTICK
Protein, UA: NEGATIVE
Urobilinogen, UA: 0.2

## 2010-11-19 NOTE — Patient Instructions (Addendum)
Per Dr. Etter Sjogren  New Regimen: 7.5mg  tonight, 5mg  on Mon & Fri. And 2.5mg  all other days Yolanda Huffman

## 2010-11-22 LAB — URINE CULTURE

## 2010-11-25 ENCOUNTER — Encounter: Payer: Self-pay | Admitting: Family Medicine

## 2010-11-30 ENCOUNTER — Ambulatory Visit (INDEPENDENT_AMBULATORY_CARE_PROVIDER_SITE_OTHER): Payer: Medicare Other | Admitting: Family Medicine

## 2010-11-30 ENCOUNTER — Ambulatory Visit: Payer: Medicare Other | Admitting: Family Medicine

## 2010-11-30 ENCOUNTER — Encounter: Payer: Self-pay | Admitting: Family Medicine

## 2010-11-30 VITALS — BP 132/74 | HR 85 | Temp 97.7°F | Resp 16 | Wt 183.4 lb

## 2010-11-30 DIAGNOSIS — I10 Essential (primary) hypertension: Secondary | ICD-10-CM

## 2010-11-30 DIAGNOSIS — E785 Hyperlipidemia, unspecified: Secondary | ICD-10-CM

## 2010-11-30 DIAGNOSIS — E119 Type 2 diabetes mellitus without complications: Secondary | ICD-10-CM

## 2010-11-30 DIAGNOSIS — R609 Edema, unspecified: Secondary | ICD-10-CM

## 2010-11-30 DIAGNOSIS — Z7901 Long term (current) use of anticoagulants: Secondary | ICD-10-CM

## 2010-11-30 DIAGNOSIS — N390 Urinary tract infection, site not specified: Secondary | ICD-10-CM

## 2010-11-30 LAB — BASIC METABOLIC PANEL
CO2: 22 mEq/L (ref 19–32)
Calcium: 9.5 mg/dL (ref 8.4–10.5)
Creatinine, Ser: 2.1 mg/dL — ABNORMAL HIGH (ref 0.4–1.2)
Glucose, Bld: 150 mg/dL — ABNORMAL HIGH (ref 70–99)

## 2010-11-30 LAB — LIPID PANEL: Cholesterol: 108 mg/dL (ref 0–200)

## 2010-11-30 LAB — MICROALBUMIN / CREATININE URINE RATIO
Creatinine,U: 88.4 mg/dL
Microalb, Ur: 12.3 mg/dL — ABNORMAL HIGH (ref 0.0–1.9)

## 2010-11-30 LAB — HEPATIC FUNCTION PANEL
ALT: 20 U/L (ref 0–35)
AST: 21 U/L (ref 0–37)
Albumin: 3.7 g/dL (ref 3.5–5.2)

## 2010-11-30 MED ORDER — FUROSEMIDE 40 MG PO TABS: ORAL_TABLET | ORAL | Status: DC

## 2010-11-30 MED ORDER — CIPROFLOXACIN HCL 500 MG PO TABS: 500.0000 mg | ORAL_TABLET | Freq: Two times a day (BID) | ORAL | Status: AC

## 2010-11-30 NOTE — Patient Instructions (Addendum)
Edema Edema is an abnormal build-up of fluids in tissues. Because this is partly dependent on gravity (water flows to the lowest place), it is more common in the lower extremities (legs and thighs). It is also common in the looser tissues, like around the eyes. Painless swelling of the feet and ankles is common and increases as a person ages. It may affect both legs and may include the calves or even thighs. When squeezed, the fluid may move out of the affected area and may leave a dent for a few moments. CAUSES  Prolonged standing or sitting in one place for extended periods of time. Movement helps pump tissue fluid into the veins, and absence of movement prevents this, resulting in edema.   Varicose veins. The valves in the veins do not work as well as they should. This causes fluid to leak into the tissues.   Fluid and salt overload.   Injury, burn, or surgery to the leg, ankle, or foot, may damage veins and allow fluid to leak out.   Sunburn damages vessels. Leaky vessels allow fluid to go out into the sunburned tissues.   Allergies (from insect bites or stings, medications or chemicals) cause swelling by allowing vessels to become leaky.   Protein in the blood helps keep fluid in your vessels. Low protein, as in malnutrition, allows fluid to leak out.   Hormonal changes, including pregnancy and menstruation, cause fluid retention. This fluid may leak out of vessels and cause edema.   Medications that cause fluid retention. Examples are sex hormones, blood pressure medications, steroid treatment, or anti-depressants.   Some illnesses cause edema, especially heart failure, kidney disease, or liver disease.   Surgery that cuts veins or lymph nodes, such as surgery done for the heart or for breast cancer, may result in edema.  DIAGNOSIS Your caregiver is usually easily able to determine what is causing your swelling (edema) by simply asking what is wrong (getting a history) and examining  you (doing a physical). Sometimes x-rays, EKG (electrocardiogram or heart tracing), and blood work may be done to evaluate for underlying medical illness. TREATMENT General treatment includes:  Leg elevation (or elevation of the affected body part).   Restriction of fluid intake.   Prevention of fluid overload.   Compression of the affected body part. Compression with elastic bandages or support stockings squeezes the tissues, preventing fluid from entering and forcing it back into the blood vessels.   Diuretics (also called water pills or fluid pills) pull fluid out of your body in the form of increased urination. These are effective in reducing the swelling, but can have side effects and must be used only under your caregiver's supervision. Diuretics are appropriate only for some types of edema.  The specific treatment can be directed at any underlying causes discovered. Heart, liver, or kidney disease should be treated appropriately. HOME CARE INSTRUCTIONS  Elevate the legs (or affected body part) above the level of the heart, while lying down.   Avoid sitting or standing still for prolonged periods of time.   Avoid putting anything directly under the knees when lying down, and do not wear constricting clothing or garters on the upper legs.   Exercising the legs causes the fluid to work back into the veins and lymphatic channels. This may help the swelling go down.   The pressure applied by elastic bandages or support stockings can help reduce ankle swelling.   A low-salt diet may help reduce fluid retention and decrease the  ankle swelling.   Take any medications exactly as prescribed.  SEEK MEDICAL CARE IF:  Your edema is not responding to recommended treatments.  SEEK IMMEDIATE MEDICAL CARE IF:  You develop shortness of breath or chest pain.   You cannot breathe when you lay down; or if, while lying down, you have to get up and go to the window to get your breath.   You are  having increasing swelling without relief from treatment.   You develop a fever over 100.4.   You develop pain or redness in the areas that are swollen.   Tell your caregiver right away if you have gained 1-2lbs in 1 day or 5lbs in a week.  MAKE SURE YOU:  Understand these instructions.   Will watch your condition.   Will get help right away if you are not doing well or get worse.   Take Lasix 40 mg 2 a day for 3-4 days then go back down to 1 a day Document Released: 06/13/2005 Document Re-Released: 12/01/2009 [facility] Patient Information 2011 [place].   Urinary Tract Infection (UTI) Infections of the urinary tract can start in several places. A bladder infection (cystitis), a kidney infection (pyelonephritis), and a prostate infection (prostatitis) are different types of urinary tract infections. They usually get better if treated with medicines (antibiotics) that kill germs. Take all the medicine until it is gone. You or your child may feel better in a few days, but TAKE ALL MEDICINE or the infection may not respond and may become more difficult to treat. HOME CARE INSTRUCTIONS  Drink enough water and fluids to keep the urine clear or pale yellow. Cranberry juice is especially recommended, in addition to large amounts of water.   Avoid caffeine, tea, and carbonated beverages. They tend to irritate the bladder.   Alcohol may irritate the prostate.   Only take over-the-counter or prescription medicines for pain, discomfort, or fever as directed by your caregiver.  FINDING OUT THE RESULTS OF YOUR TEST Not all test results are available during your visit. If your or your child's test results are not back during the visit, make an appointment with your caregiver to find out the results. Do not assume everything is normal if you have not heard from your caregiver or the medical facility. It is important for you to follow up on all test results. TO PREVENT FURTHER  INFECTIONS:  Empty the bladder often. Avoid holding urine for long periods of time.   After a bowel movement, women should cleanse from front to back. Use each tissue only once.   Empty the bladder before and after sexual intercourse.  SEEK MEDICAL CARE IF:  There is back pain.   You or your child has an oral temperature above 100.4.   Your baby is older than 3 months with a rectal temperature of 100.5 F (38.1 C) or higher for more than 1 day.   Your or your child's problems (symptoms) are no better in 3 days. Return sooner if you or your child is getting worse.  SEEK IMMEDIATE MEDICAL CARE IF:  There is severe back pain or lower abdominal pain.   You or your child develops chills.   You or your child has an oral temperature above 100.4, not controlled by medicine.   Your baby is older than 3 months with a rectal temperature of 102 F (38.9 C) or higher.   Your baby is 63 months old or younger with a rectal temperature of 100.4 F (38  C) or higher.   There is nausea or vomiting.   There is continued burning or discomfort with urination.  MAKE SURE YOU:  Understand these instructions.   Will watch this condition.   Will get help right away if you or your child is not doing well or gets worse.  Document Released: 03/23/2005 Document Re-Released: 09/07/2009 [facility] Patient Information 2011 [place].

## 2010-11-30 NOTE — Progress Notes (Signed)
  Subjective:    Patient ID: Yolanda Huffman, female    DOB: 12/27/1940, 70 y.o.   MRN: [id number]  HPI Pt here c/o swelling in legs.  No other complaints.  Pt also needs PT check and f/u UTI.      Review of Systems  Constitutional: Negative for fever, chills, diaphoresis, activity change, appetite change, fatigue and unexpected weight change.  Respiratory: Negative for apnea, cough, choking, chest tightness, shortness of breath and wheezing.   Cardiovascular: Positive for leg swelling. Negative for chest pain and palpitations.  Genitourinary: Positive for frequency. Negative for dysuria and urgency.  Psychiatric/Behavioral: Negative for hallucinations, behavioral problems, confusion, dysphoric mood, decreased concentration and agitation.       Objective:   Physical Exam  Constitutional: She is oriented to person, place, and time. She appears well-developed and well-nourished.  Cardiovascular: Normal rate and regular rhythm.   Pulmonary/Chest: Effort normal and breath sounds normal. She has no wheezes. She has no rales.  Musculoskeletal: She exhibits edema. She exhibits no tenderness.  Neurological: She is alert and oriented to person, place, and time.  Psychiatric: She has a normal mood and affect. Her behavior is normal.          Assessment & Plan:

## 2010-11-30 NOTE — Assessment & Plan Note (Signed)
Culture + cipro 500 bid for 5 days

## 2010-11-30 NOTE — Assessment & Plan Note (Signed)
Cont meds Check labs 

## 2010-11-30 NOTE — Assessment & Plan Note (Signed)
Stable con't meds 

## 2010-11-30 NOTE — Assessment & Plan Note (Signed)
Elevate legs Compression stockings [employer] lasix to 80 mg daily for 3-4 days then dec to 40 mg F/u 2-3 weeks

## 2010-11-30 NOTE — Assessment & Plan Note (Signed)
Check labs con't meds 

## 2010-12-03 ENCOUNTER — Telehealth: Payer: Self-pay | Admitting: *Deleted

## 2010-12-03 ENCOUNTER — Ambulatory Visit: Payer: Self-pay | Admitting: Family Medicine

## 2010-12-03 ENCOUNTER — Telehealth: Payer: Self-pay | Admitting: Family Medicine

## 2010-12-03 DIAGNOSIS — N289 Disorder of kidney and ureter, unspecified: Secondary | ICD-10-CM

## 2010-12-03 DIAGNOSIS — R809 Proteinuria, unspecified: Secondary | ICD-10-CM

## 2010-12-03 MED ORDER — INSULIN DETEMIR 100 UNIT/ML ~~LOC~~ SOLN: 5.0000 [IU] | Freq: Every day | SUBCUTANEOUS | Status: DC

## 2010-12-03 MED ORDER — INSULIN GLULISINE 100 UNIT/ML ~~LOC~~ SOLN: 10.0000 [IU] | Freq: Three times a day (TID) | SUBCUTANEOUS | Status: DC

## 2010-12-03 NOTE — Telephone Encounter (Signed)
Message copied by Marylen Ponto on Fri Dec 03, 2010  8:37 AM ------      Message from: Rosalita Chessman      Created: Tue Nov 30, 2010  4:55 PM       + protein in urine and kidney function elevated-----drink more fluids per ov and we will refer to nephrology      I need to know how her blood sugars are running at home

## 2010-12-03 NOTE — Telephone Encounter (Signed)
Add apidra 10 u SQ before lunch and dinner ---con't to check glucose and call next week with readings

## 2010-12-03 NOTE — Telephone Encounter (Signed)
Discuss with patient  

## 2010-12-03 NOTE — Telephone Encounter (Signed)
Pt notes that blood sugar reading are before meals.  11-29-10 BS fasting 96, lunch 122, dinner 205,  11-30-10 BS fasting 147, lunch 125, dinner 132, 12-01-10 BS Fasting 103, lunch 107, dinner 161 12-02-10 BS fasting 110, lunch 75, dinner 151 Today BS 114

## 2010-12-03 NOTE — Telephone Encounter (Signed)
done

## 2010-12-14 ENCOUNTER — Encounter: Payer: Self-pay | Admitting: Family Medicine

## 2010-12-14 ENCOUNTER — Ambulatory Visit (INDEPENDENT_AMBULATORY_CARE_PROVIDER_SITE_OTHER): Payer: Medicare Other | Admitting: Family Medicine

## 2010-12-14 DIAGNOSIS — E119 Type 2 diabetes mellitus without complications: Secondary | ICD-10-CM

## 2010-12-14 DIAGNOSIS — I10 Essential (primary) hypertension: Secondary | ICD-10-CM

## 2010-12-14 DIAGNOSIS — I4891 Unspecified atrial fibrillation: Secondary | ICD-10-CM

## 2010-12-14 DIAGNOSIS — E785 Hyperlipidemia, unspecified: Secondary | ICD-10-CM

## 2010-12-14 DIAGNOSIS — R609 Edema, unspecified: Secondary | ICD-10-CM

## 2010-12-14 LAB — BASIC METABOLIC PANEL
BUN: 26 mg/dL — ABNORMAL HIGH (ref 6–23)
GFR: 29.34 mL/min — ABNORMAL LOW (ref >60.00)
Potassium: 3 mEq/L — ABNORMAL LOW (ref 3.5–5.1)
Sodium: 136 mEq/L (ref 135–145)

## 2010-12-14 LAB — PROTIME-INR: Prothrombin Time: 25 s — ABNORMAL HIGH (ref 10.2–12.4)

## 2010-12-14 NOTE — Patient Instructions (Signed)
Take levemir every night Check glucose 4x a day --fasting and 2h after each meal for 2 weeks --then call or fax in readings.  If you have a problem taking the levemir call us.

## 2010-12-14 NOTE — Assessment & Plan Note (Signed)
Improved con't lasix 40mg  daily

## 2010-12-14 NOTE — Assessment & Plan Note (Signed)
Take levemir 5 u daily at dinner time Hold apidra for now Check glucose 4x a day  Fasting and 2 h after meals for 2 weeks and fax or call in readings

## 2010-12-14 NOTE — Assessment & Plan Note (Signed)
Check Pt today

## 2010-12-14 NOTE — Progress Notes (Signed)
  Subjective:    Patient ID: Yolanda Huffman, female    DOB: 07/03/1940, 70 y.o.   MRN: [id number]  HPI Pt here f/u PT and edema.  Pt doing much better.  Pt stopped apidra secondary it made her feel funny.  Pt is not taking levemir every day--only if sugar is high.     Review of Systems As above    Objective:   Physical Exam  Constitutional: She is oriented to person, place, and time. She appears well-developed and well-nourished.  Musculoskeletal: She exhibits no edema and no tenderness.  Neurological: She is alert and oriented to person, place, and time.  Psychiatric: She has a normal mood and affect. Her behavior is normal. Judgment and thought content normal.          Assessment & Plan:

## 2010-12-14 NOTE — Assessment & Plan Note (Signed)
Cont meds   

## 2010-12-14 NOTE — Assessment & Plan Note (Signed)
con't meds 

## 2010-12-16 ENCOUNTER — Telehealth: Payer: Self-pay | Admitting: *Deleted

## 2010-12-16 ENCOUNTER — Encounter: Payer: Self-pay | Admitting: *Deleted

## 2010-12-16 MED ORDER — POTASSIUM CHLORIDE CRYS ER 20 MEQ PO TBCR: 20.0000 meq | EXTENDED_RELEASE_TABLET | Freq: Every day | ORAL | Status: DC

## 2010-12-16 NOTE — Telephone Encounter (Signed)
Discuss with patient, Rx sent to pharmacy. 

## 2010-12-16 NOTE — Telephone Encounter (Signed)
Message copied by Marylen Ponto on Thu Dec 16, 2010  3:28 PM ------      Message from: Rosalita Chessman      Created: Wed Dec 15, 2010  8:54 AM       K low---take KCL 20 meq 1 po qd  #30  Recheck K in 1 week        INR  In range---recheck PT/INR in 1 month

## 2010-12-22 ENCOUNTER — Other Ambulatory Visit: Payer: Self-pay | Admitting: Family Medicine

## 2010-12-22 DIAGNOSIS — E876 Hypokalemia: Secondary | ICD-10-CM

## 2010-12-23 ENCOUNTER — Other Ambulatory Visit (INDEPENDENT_AMBULATORY_CARE_PROVIDER_SITE_OTHER): Payer: Medicare Other

## 2010-12-23 DIAGNOSIS — E876 Hypokalemia: Secondary | ICD-10-CM

## 2010-12-23 NOTE — Progress Notes (Signed)
Labs only

## 2011-01-12 ENCOUNTER — Telehealth: Payer: Self-pay | Admitting: *Deleted

## 2011-01-12 NOTE — Telephone Encounter (Signed)
Prior Auth approved from 12-21-10 until 01-11-12, Pharmacy notified via fax, approval letter scan to chart.

## 2011-01-13 ENCOUNTER — Ambulatory Visit (INDEPENDENT_AMBULATORY_CARE_PROVIDER_SITE_OTHER): Payer: Medicare Other | Admitting: *Deleted

## 2011-01-13 ENCOUNTER — Telehealth: Payer: Self-pay | Admitting: *Deleted

## 2011-01-13 DIAGNOSIS — Z7901 Long term (current) use of anticoagulants: Secondary | ICD-10-CM

## 2011-01-13 DIAGNOSIS — I4891 Unspecified atrial fibrillation: Secondary | ICD-10-CM

## 2011-01-13 LAB — POCT INR: INR: 2

## 2011-01-13 NOTE — Telephone Encounter (Signed)
Discussed with patient and she voiced understanding, will continue her current dose of meds     KP

## 2011-01-13 NOTE — Telephone Encounter (Signed)
mssg left on VM     KP 

## 2011-01-13 NOTE — Patient Instructions (Addendum)
Return to office in 4 weeks  Continue current dose: Take 1/2 tab daily (2.5 mg) except 1 tab on mon,fri

## 2011-01-13 NOTE — Telephone Encounter (Signed)
Great!  con't meds

## 2011-01-13 NOTE — Telephone Encounter (Signed)
6-20---98,112,130,132 6-21---88,110,115,116 6-22--90.115,120,125 6-23--95,120,125,130 6-24--90,121,128,131 6-25--77,80,119,126 6-26--87,97,117,125 6-27--89,109,121,128 6-28--74,92,128,130 6-29--84,117,125,128 6-30--88,110,131,138 7-1--68,89,122,131 7-2--70,89,132,135 7-3--86,110,120,127  Copy placed on ledge for you to review as well

## 2011-01-24 ENCOUNTER — Telehealth: Payer: Self-pay | Admitting: Family Medicine

## 2011-01-24 DIAGNOSIS — E785 Hyperlipidemia, unspecified: Secondary | ICD-10-CM

## 2011-01-24 MED ORDER — ATORVASTATIN CALCIUM 20 MG PO TABS: 20.0000 mg | ORAL_TABLET | Freq: Every day | ORAL | Status: DC

## 2011-01-24 NOTE — Telephone Encounter (Signed)
Sent in

## 2011-02-08 ENCOUNTER — Other Ambulatory Visit: Payer: Self-pay | Admitting: Family Medicine

## 2011-02-08 DIAGNOSIS — Z1231 Encounter for screening mammogram for malignant neoplasm of breast: Secondary | ICD-10-CM

## 2011-02-11 ENCOUNTER — Ambulatory Visit (INDEPENDENT_AMBULATORY_CARE_PROVIDER_SITE_OTHER): Payer: Medicare Other | Admitting: *Deleted

## 2011-02-11 ENCOUNTER — Other Ambulatory Visit: Payer: Self-pay | Admitting: Family Medicine

## 2011-02-11 DIAGNOSIS — I4891 Unspecified atrial fibrillation: Secondary | ICD-10-CM

## 2011-02-11 DIAGNOSIS — Z7901 Long term (current) use of anticoagulants: Secondary | ICD-10-CM

## 2011-02-11 LAB — POCT INR: INR: 2.2

## 2011-02-11 MED ORDER — PANTOPRAZOLE SODIUM 40 MG PO TBEC: 40.0000 mg | DELAYED_RELEASE_TABLET | Freq: Every day | ORAL | Status: DC

## 2011-02-11 NOTE — Telephone Encounter (Signed)
Patient's daughter called about 12:40 today and was upset that this prescription was not ready for pickup at the pharmacy because she was there--I explained that prescription needed to be OK'd by doctor---she asked, if someone could just stop for a minute, to sign it---I explained that I would get a message to the nurse to see if prescription could be sent over today for Pantoprazole--[facility], [address], [place]

## 2011-02-11 NOTE — Telephone Encounter (Signed)
RX sent to the pharmacy-see medication tab

## 2011-02-14 NOTE — Patient Instructions (Signed)
Pt advised no change recheck 4 weeks

## 2011-03-10 ENCOUNTER — Ambulatory Visit: Payer: Medicare Other | Admitting: Family Medicine

## 2011-03-11 ENCOUNTER — Ambulatory Visit (INDEPENDENT_AMBULATORY_CARE_PROVIDER_SITE_OTHER): Payer: Medicare Other | Admitting: Family Medicine

## 2011-03-11 ENCOUNTER — Encounter: Payer: Self-pay | Admitting: Family Medicine

## 2011-03-11 VITALS — BP 116/64 | HR 90 | Temp 98.5°F | Wt 165.4 lb

## 2011-03-11 DIAGNOSIS — Z7901 Long term (current) use of anticoagulants: Secondary | ICD-10-CM

## 2011-03-11 DIAGNOSIS — I10 Essential (primary) hypertension: Secondary | ICD-10-CM

## 2011-03-11 DIAGNOSIS — E876 Hypokalemia: Secondary | ICD-10-CM

## 2011-03-11 DIAGNOSIS — I2510 Atherosclerotic heart disease of native coronary artery without angina pectoris: Secondary | ICD-10-CM

## 2011-03-11 DIAGNOSIS — E119 Type 2 diabetes mellitus without complications: Secondary | ICD-10-CM

## 2011-03-11 DIAGNOSIS — I4891 Unspecified atrial fibrillation: Secondary | ICD-10-CM

## 2011-03-11 DIAGNOSIS — E785 Hyperlipidemia, unspecified: Secondary | ICD-10-CM

## 2011-03-11 LAB — POCT INR: INR: 1.7

## 2011-03-11 NOTE — Assessment & Plan Note (Signed)
On coumadin con't f/u cardio

## 2011-03-11 NOTE — Patient Instructions (Addendum)
Diabetes and Foot Care Diabetes may cause you to have a poor blood supply (circulation) to your legs and feet. Because of this, the skin may be thinner, break easier, and heal slower. You also may have nerve damage in your legs and feet causing decreased feeling. You may not notice minor injuries to your feet that could lead to serious problems or infections. Taking care of your feet is one of the most important things you can do for yourself.  HOME CARE INSTRUCTIONS FOR FOOT CARE  Do not go barefoot. Bare feet are easily injured.   Check your feet daily for blisters, cuts, and redness.   Wash your feet with warm water (not hot) and mild soap. Pat your feet and between your toes until completely dry.   Apply a moisturizing lotion that does not contain alcohol or petroleum jelly to the dry skin on your feet and to dry brittle toenails. Do not put it between your toes.   Trim your toenails straight across. Do not dig under them or around the cuticle.   Do not cut corns or calluses, or try to remove them with medicine.   Wear clean cotton socks or stockings every day. Make sure they are not too tight. Do not wear knee high stockings since they may decrease blood flow to your legs.   Wear leather shoes that fit properly and have enough cushioning. To break in new shoes, wear them just a few hours a day to avoid injuring your feet.   Wear shoes at all times, even in the house.   Do not cross your legs. This may decrease the blood flow to your feet.   If you find a minor scrape, cut, or break in the skin on your feet, keep it and the skin around it clean and dry. These areas may be cleansed with mild soap and water. Do not use peroxide, alcohol, iodine or Merthiolate.   When you remove an adhesive bandage, be sure not to harm the skin around it.   If you have a wound, look at it several times a day to make sure it is healing.   Do not use heating pads or hot water bottles. Burns can occur. If  you have lost feeling in your feet or legs, you may not know it is happening until it is too late.   Report any cuts, sores or bruises to your caregiver. Do not wait!  SEEK MEDICAL CARE IF:  You have an injury that is not healing or you notice redness, numbness, burning, or tingling.   Your feet always feel cold.   You have pain or cramps in your legs and feet.  SEEK IMMEDIATE MEDICAL CARE IF:  There is increasing redness, swelling, or increasing pain in the wound.   There is a red line that goes up your leg.   Pus is coming from a wound.   You develop an unexplained oral temperature above 102 F (38.9 C), or as your caregiver suggests.   You notice a bad smell coming from an ulcer or wound.  MAKE SURE YOU:   Understand these instructions.   Will watch your condition.   Will get help right away if you are not doing well or get worse.  Document Released: 06/10/2000 Document Re-Released: 04/10/2009 [facility] Patient Information 2011 [place].   COUMADIN-----mwf  5 MG AND 2.5 ALL OTHER DAYS-=--- RECHECK 2 WEEKS

## 2011-03-11 NOTE — Assessment & Plan Note (Signed)
con't meds stable 

## 2011-03-11 NOTE — Progress Notes (Signed)
  Subjective:    Patient ID: Yolanda Huffman, female    DOB: 03/26/1941, 70 y.o.   MRN: [id number]  HPI HYPERTENSION Disease Monitoring Blood pressure range- not checking Chest pain- no      Dyspnea- no Medications Compliance- good Lightheadedness- no   Edema- no   DIABETES Disease Monitoring Blood Sugar ranges-84-103 Polyuria- no New Visual problems- no Medications Compliance- good Hypoglycemic symptoms- no   HYPERLIPIDEMIA Disease Monitoring See symptoms for Hypertension Medications Compliance-  RUQ pain- none  Muscle aches- no  ROS See HPI above   PMH Smoking Status noted   coumadin--  5 mg MF and 2.5 mg all other days---INR today 1.7 --- START 5 MG MWF AND 2.5 ALL OTHER DAYS  Review of Systems As above    Objective:   Physical Exam  Constitutional: She is oriented to person, place, and time. She appears well-developed and well-nourished.  Cardiovascular: Normal rate, regular rhythm and normal heart sounds.   No murmur heard. Pulmonary/Chest: Effort normal and breath sounds normal. No respiratory distress. She has no wheezes. She exhibits no tenderness.  Neurological: She is alert and oriented to person, place, and time.  Skin: Skin is warm and dry.  Psychiatric: She has a normal mood and affect. Her behavior is normal. Thought content normal.          Assessment & Plan:

## 2011-03-11 NOTE — Assessment & Plan Note (Signed)
con't meds  Check labs 

## 2011-03-11 NOTE — Assessment & Plan Note (Signed)
Check labs con't meds 

## 2011-03-14 ENCOUNTER — Ambulatory Visit: Payer: Medicare Other

## 2011-03-16 ENCOUNTER — Ambulatory Visit: Payer: Medicare Other | Admitting: Family Medicine

## 2011-03-16 ENCOUNTER — Ambulatory Visit
Admission: RE | Admit: 2011-03-16 | Discharge: 2011-03-16 | Disposition: A | Discharge status: Home / Self Care | Payer: Medicare Other | Source: Ambulatory Visit | Attending: Family Medicine | Admitting: Family Medicine

## 2011-03-16 DIAGNOSIS — Z1231 Encounter for screening mammogram for malignant neoplasm of breast: Secondary | ICD-10-CM

## 2011-03-21 ENCOUNTER — Other Ambulatory Visit: Payer: Self-pay | Admitting: Family Medicine

## 2011-03-21 ENCOUNTER — Ambulatory Visit: Payer: Medicare Other

## 2011-03-22 ENCOUNTER — Other Ambulatory Visit: Payer: Self-pay | Admitting: Family Medicine

## 2011-03-22 MED ORDER — ATORVASTATIN CALCIUM 20 MG PO TABS: 20.0000 mg | ORAL_TABLET | Freq: Every day | ORAL | Status: DC

## 2011-03-22 MED ORDER — FUROSEMIDE 40 MG PO TABS: 40.0000 mg | ORAL_TABLET | Freq: Every day | ORAL | Status: DC

## 2011-03-22 NOTE — Telephone Encounter (Signed)
repeat labs due--future order in the system    KP

## 2011-03-28 ENCOUNTER — Ambulatory Visit: Payer: Medicare Other | Admitting: Family Medicine

## 2011-03-31 NOTE — Telephone Encounter (Signed)
RX  For Atorvastatin Calcium 20 mg tab #30 wasn't received by pharmacy. Please re-fax. Yolanda Huffman [phone] [address] [place] [address]

## 2011-04-01 ENCOUNTER — Encounter: Payer: Self-pay | Admitting: Family Medicine

## 2011-04-01 ENCOUNTER — Ambulatory Visit (INDEPENDENT_AMBULATORY_CARE_PROVIDER_SITE_OTHER): Payer: Medicare Other | Admitting: Family Medicine

## 2011-04-01 ENCOUNTER — Ambulatory Visit: Payer: Medicare Other | Admitting: Family Medicine

## 2011-04-01 VITALS — BP 122/70 | HR 91 | Temp 98.5°F | Wt 165.0 lb

## 2011-04-01 DIAGNOSIS — I4891 Unspecified atrial fibrillation: Secondary | ICD-10-CM

## 2011-04-01 DIAGNOSIS — Z7901 Long term (current) use of anticoagulants: Secondary | ICD-10-CM

## 2011-04-01 DIAGNOSIS — E785 Hyperlipidemia, unspecified: Secondary | ICD-10-CM

## 2011-04-01 DIAGNOSIS — E119 Type 2 diabetes mellitus without complications: Secondary | ICD-10-CM

## 2011-04-01 DIAGNOSIS — I10 Essential (primary) hypertension: Secondary | ICD-10-CM

## 2011-04-01 LAB — LIPID PANEL
LDL Cholesterol: 61 mg/dL (ref 0–99)
Total CHOL/HDL Ratio: 2
Triglycerides: 72 mg/dL (ref 0.0–149.0)
VLDL: 14.4 mg/dL (ref 0.0–40.0)

## 2011-04-01 LAB — HEPATIC FUNCTION PANEL
ALT: 21 U/L (ref 0–35)
AST: 25 U/L (ref 0–37)
Bilirubin, Direct: 0.2 mg/dL (ref 0.0–0.3)
Total Bilirubin: 1.1 mg/dL (ref 0.3–1.2)

## 2011-04-01 LAB — BASIC METABOLIC PANEL
Chloride: 99 mEq/L (ref 96–112)
Creatinine, Ser: 1.7 mg/dL — ABNORMAL HIGH (ref 0.4–1.2)
GFR: 31.3 mL/min — ABNORMAL LOW (ref >60.00)
Potassium: 3.2 mEq/L — ABNORMAL LOW (ref 3.5–5.1)

## 2011-04-01 LAB — MICROALBUMIN / CREATININE URINE RATIO: Microalb Creat Ratio: 15.1 mg/g (ref 0.0–30.0)

## 2011-04-01 LAB — POCT INR: INR: 1.9

## 2011-04-01 NOTE — Patient Instructions (Signed)
Take coumadin 5 mg  M,T, th,F  And 2.5 all other days ----recheck 2 weeks

## 2011-04-01 NOTE — Progress Notes (Signed)
  Subjective:    Patient ID: Yolanda Huffman, female    DOB: 05/20/1941, 70 y.o.   MRN: [id number]  HPI Pt here for PT check and labs.  No new complaints.   Review of Systems As above    Objective:   Physical Exam  Constitutional: She appears well-developed and well-nourished.  Cardiovascular: Normal rate, regular rhythm and normal heart sounds.   Pulmonary/Chest: Effort normal and breath sounds normal.  Psychiatric: She has a normal mood and affect. Her behavior is normal. Thought content normal.          Assessment & Plan:  A/P--a fib--on coumadin---see AVS          HTN--- cont' meds          Hyperlipidemia---- con't meds, check labs           DM-- check labs, con't meds

## 2011-04-07 ENCOUNTER — Telehealth: Payer: Self-pay

## 2011-04-07 NOTE — Telephone Encounter (Signed)
Message copied by Ewing Schlein on Thu Apr 07, 2011  9:07 AM ------      Message from: Rosalita Chessman      Created: Sun Apr 03, 2011  5:43 PM       DM not controlled----increase glucophage to 1000 mg bid #60  2 refills      Is pt taking K supplementation-----if no--start kcl 20 meq #30  1 po qd , 2 refills            Recheck 3 months----250.00  401.9  Lipid, hep, bmp, hgba1c

## 2011-04-07 NOTE — Telephone Encounter (Signed)
Spoke with patient and I advised her of Dr.Lowne recommendations and advised her to write it down so she will not forget. She voiced understanding and agreed to call in on Monday with CBG results .    KP

## 2011-04-07 NOTE — Telephone Encounter (Signed)
She needs to take levemir bid --- start with 5 u and check BS 4x a day and call Monday with readings--- if it is too difficult to control we will refer to Endo.

## 2011-04-07 NOTE — Telephone Encounter (Signed)
Discussed with patient an she stated she was not taking the Metformin, she stated she was taking off of it due to elevated Kidney functions and has never restarted. She stated the only thing she is currently taking for her Diabetes is Levemir 5 units 4 times a day only if her CBG's is above 100mg /dl. Please advise   KP

## 2011-04-11 ENCOUNTER — Telehealth: Payer: Self-pay | Admitting: *Deleted

## 2011-04-11 ENCOUNTER — Other Ambulatory Visit: Payer: Self-pay | Admitting: Family Medicine

## 2011-04-11 NOTE — Telephone Encounter (Signed)
Pt was asked to call with recent CBG readings. See below as well.  04/10/11 Fasting:67 Before lunch:107 Before dinner: 114 Before bed: 115

## 2011-04-11 NOTE — Telephone Encounter (Signed)
Discuss with patient  

## 2011-04-11 NOTE — Telephone Encounter (Signed)
They seem good but if the fasting blood sugar has her not feeling well we may need to change the time she takes it.

## 2011-04-11 NOTE — Telephone Encounter (Signed)
Pt called with CBG readings: 04/07/11 Fasting: 89 Before lunch:105 Before dinner:107 Before bed:118  04/08/11 Fasting: 62 Before lunch:108 Before dinner: 107 Before bed: 120  04/09/11 Fasting: 62 Before lunch: 87 Before dinner: 109 Before bed: 115

## 2011-04-15 ENCOUNTER — Ambulatory Visit (INDEPENDENT_AMBULATORY_CARE_PROVIDER_SITE_OTHER): Payer: Medicare Other | Admitting: *Deleted

## 2011-04-15 DIAGNOSIS — I4891 Unspecified atrial fibrillation: Secondary | ICD-10-CM

## 2011-04-15 DIAGNOSIS — Z7901 Long term (current) use of anticoagulants: Secondary | ICD-10-CM

## 2011-04-15 NOTE — Patient Instructions (Addendum)
Return to office in 2 weeks for PT/INR   Continue current dosing: 5 mg M, T, Th, Fri-----2.5 mg all other days

## 2011-04-28 ENCOUNTER — Ambulatory Visit: Payer: Medicare Other

## 2011-05-06 ENCOUNTER — Ambulatory Visit (INDEPENDENT_AMBULATORY_CARE_PROVIDER_SITE_OTHER): Payer: Medicare Other | Admitting: *Deleted

## 2011-05-06 DIAGNOSIS — I4891 Unspecified atrial fibrillation: Secondary | ICD-10-CM

## 2011-05-06 DIAGNOSIS — Z7901 Long term (current) use of anticoagulants: Secondary | ICD-10-CM

## 2011-05-06 NOTE — Patient Instructions (Addendum)
Return to office in 4 weeks for PT/INR   New Dosing:Hold today then resume 5 mg M, Fri-----2.5 mg daily

## 2011-05-10 ENCOUNTER — Other Ambulatory Visit: Payer: Self-pay | Admitting: Family Medicine

## 2011-05-10 MED ORDER — METOPROLOL SUCCINATE ER 25 MG PO TB24: ORAL_TABLET | ORAL | Status: DC

## 2011-05-10 NOTE — Telephone Encounter (Signed)
Rx sent 

## 2011-05-12 ENCOUNTER — Other Ambulatory Visit: Payer: Self-pay | Admitting: Family Medicine

## 2011-05-12 MED ORDER — AMLODIPINE BESYLATE 10 MG PO TABS: 10.0000 mg | ORAL_TABLET | Freq: Every day | ORAL | Status: DC

## 2011-05-12 NOTE — Telephone Encounter (Signed)
Office visit UTD    KP

## 2011-05-13 ENCOUNTER — Ambulatory Visit (INDEPENDENT_AMBULATORY_CARE_PROVIDER_SITE_OTHER): Payer: Medicare Other | Admitting: *Deleted

## 2011-05-13 DIAGNOSIS — Z7901 Long term (current) use of anticoagulants: Secondary | ICD-10-CM

## 2011-05-13 DIAGNOSIS — I4891 Unspecified atrial fibrillation: Secondary | ICD-10-CM

## 2011-05-13 LAB — POCT INR: INR: 2.5

## 2011-05-13 NOTE — Patient Instructions (Signed)
Return to office in 2 weeks INR/PT  Continue current dosing: 2.5 mg daily except 5 mg M, Fri

## 2011-05-30 ENCOUNTER — Ambulatory Visit: Payer: Medicare Other

## 2011-06-07 ENCOUNTER — Ambulatory Visit (INDEPENDENT_AMBULATORY_CARE_PROVIDER_SITE_OTHER): Payer: Medicare Other | Admitting: Family Medicine

## 2011-06-07 ENCOUNTER — Encounter: Payer: Self-pay | Admitting: Family Medicine

## 2011-06-07 ENCOUNTER — Ambulatory Visit (INDEPENDENT_AMBULATORY_CARE_PROVIDER_SITE_OTHER): Payer: Medicare Other

## 2011-06-07 VITALS — BP 112/66 | HR 79 | Temp 97.7°F | Wt 176.0 lb

## 2011-06-07 DIAGNOSIS — R0602 Shortness of breath: Secondary | ICD-10-CM

## 2011-06-07 DIAGNOSIS — Z7901 Long term (current) use of anticoagulants: Secondary | ICD-10-CM

## 2011-06-07 DIAGNOSIS — J40 Bronchitis, not specified as acute or chronic: Secondary | ICD-10-CM

## 2011-06-07 LAB — POCT INR: INR: 2.5

## 2011-06-07 MED ORDER — ALBUTEROL SULFATE (5 MG/ML) 0.5% IN NEBU
2.5000 mg | INHALATION_SOLUTION | Freq: Once | RESPIRATORY_TRACT | Status: AC
  Administered 2011-06-07: 2.5 mg via RESPIRATORY_TRACT

## 2011-06-07 MED ORDER — AZITHROMYCIN 250 MG PO TABS: ORAL_TABLET | ORAL | Status: AC

## 2011-06-07 NOTE — Patient Instructions (Signed)

## 2011-06-07 NOTE — Patient Instructions (Signed)
Continue current dose of 5 mg on MF and 2.5 mg all other days--return in 4 weeks

## 2011-06-07 NOTE — Progress Notes (Signed)
  Subjective:     Yolanda Huffman is a 70 y.o. female here for evaluation of a cough. Onset of symptoms was 3 weeks ago. Symptoms have been gradually worsening since that time. The cough is dry and some congestion but it doesn't come up and is aggravated by exercise. Associated symptoms include: shortness of breath and wheezing. Patient does not have a history of asthma. Patient does not have a history of environmental allergens. Patient has not traveled recently. Patient does not have a history of smoking. Patient has not had a previous chest x-ray. Patient has not had a PPD done.  The following portions of the patient's history were reviewed and updated as appropriate: allergies, current medications, past family history, past medical history, past social history, past surgical history and problem list.  Review of Systems Pertinent items are noted in HPI.    Objective:    Oxygen saturation 95% on room air BP 112/66  Pulse 79  Temp(Src) 97.7 F (36.5 C) (Oral)  Wt 176 lb (79.833 kg)  SpO2 95% General appearance: alert, cooperative, appears stated age and no distress Ears: normal TM's and external ear canals both ears Nose: Nares normal. Septum midline. Mucosa normal. No drainage or sinus tenderness. Throat: lips, mucosa, and tongue normal; teeth and gums normal Neck: no adenopathy, supple, symmetrical, trachea midline and thyroid not enlarged, symmetric, no tenderness/mass/nodules Lungs: clear to auscultation bilaterally Heart: S1, S2 normal Extremities: extremities normal, atraumatic, no cyanosis or edema    Assessment:    Acute Bronchitis    Plan:    Antibiotics per medication orders. Antitussives per medication orders. Avoid exposure to tobacco smoke and fumes. B-agonist inhaler. Call if shortness of breath worsens, blood in sputum, change in character of cough, development of fever or chills, inability to maintain nutrition and hydration. Avoid exposure to tobacco smoke and fumes.

## 2011-06-08 ENCOUNTER — Other Ambulatory Visit: Payer: Self-pay | Admitting: Family Medicine

## 2011-06-08 ENCOUNTER — Ambulatory Visit (INDEPENDENT_AMBULATORY_CARE_PROVIDER_SITE_OTHER)
Admission: RE | Admit: 2011-06-08 | Discharge: 2011-06-08 | Disposition: A | Discharge status: Home / Self Care | Payer: Medicare Other | Source: Ambulatory Visit | Attending: Family Medicine | Admitting: Family Medicine

## 2011-06-08 DIAGNOSIS — J90 Pleural effusion, not elsewhere classified: Secondary | ICD-10-CM

## 2011-06-08 DIAGNOSIS — J40 Bronchitis, not specified as acute or chronic: Secondary | ICD-10-CM

## 2011-06-08 DIAGNOSIS — R0602 Shortness of breath: Secondary | ICD-10-CM

## 2011-06-10 ENCOUNTER — Encounter: Payer: Self-pay | Admitting: Pulmonary Disease

## 2011-06-10 ENCOUNTER — Ambulatory Visit (INDEPENDENT_AMBULATORY_CARE_PROVIDER_SITE_OTHER): Payer: Medicare Other | Admitting: Pulmonary Disease

## 2011-06-10 DIAGNOSIS — R0609 Other forms of dyspnea: Secondary | ICD-10-CM

## 2011-06-10 DIAGNOSIS — J90 Pleural effusion, not elsewhere classified: Secondary | ICD-10-CM | POA: Insufficient documentation

## 2011-06-10 DIAGNOSIS — R0989 Other specified symptoms and signs involving the circulatory and respiratory systems: Secondary | ICD-10-CM

## 2011-06-10 NOTE — Assessment & Plan Note (Signed)
The patient has chronic dyspnea on exertion at baseline due to many factors, but has had a worsening over the last 2 weeks.  She denies any symptoms suggestive of a pulmonary infection, and has excellent air flow with no wheezing on exam.  She has not seen improvement with symbicort.  Given her history of a depressed ejection fraction, bilateral pleural effusions, and a summation gallop on exam today, I suspect her symptoms are due to decompensated congestive heart failure.  She is in no respiratory distress at rest, and her oxygen saturations are adequate.  I will forward a note to her primary care physician suggesting further treatment of her CHF or referral back to cardiology.  The patient may benefit from full pulmonary function studies in light of her tobacco abuse history, but would not do these until she is back to her normal baseline.

## 2011-06-10 NOTE — Assessment & Plan Note (Signed)
These are small (but worse from 08/2010) and are due to chf until proven otherwise.  They are not worth tapping given their size and the fact the pt is on coumadin.

## 2011-06-10 NOTE — Patient Instructions (Signed)
Ok to stop your symbicort since it has not helped, and your symptoms are more than likely due to your heart failure. Will send a note to Dr. Etter Sjogren today, asking her to address this from a cardiac perspective.   You will benefit at some point from having breathing tests, to put the issue of copd to rest.  Would no do these until your heart issues have been addressed.

## 2011-06-10 NOTE — Progress Notes (Signed)
  Subjective:    Patient ID: Yolanda Huffman, female    DOB: 1940-10-09, 70 y.o.   MRN: [id number]  HPI The patient is a 70 year old female who I've been asked to see for worsening dyspnea on exertion.  The patient has a known cardiomyopathy with an ejection fraction of 45% in March of this year, as well as coronary artery disease and atrial fibrillation.  She is on chronic Coumadin for this.  She also has a long history of tobacco use, but has not smoked since 1993.  She has never had pulmonary function studies.  The patient has baseline chronic dyspnea on exertion, but approximately 2 weeks ago began to have worsening of her symptoms.  She denies any cough, congestion, mucus production, or chest pain.  She describes an occasional upper airway wheeze that is clearly coming from her throat area.  She has had a chest x-ray which shows small bilateral pleural effusions, but no definite edema.  The effusions appear worse than her last chest x-ray in March of this year.  The patient was given a symbicort inhaler as well as a Z-Pak for possible bronchitis, but she has seen no difference in her symptoms.   Review of Systems  Constitutional: Negative for fever and unexpected weight change.  HENT: Negative for ear pain, nosebleeds, congestion, sore throat, rhinorrhea, sneezing, trouble swallowing, dental problem, postnasal drip and sinus pressure.   Eyes: Negative for redness and itching.  Respiratory: Positive for cough and shortness of breath. Negative for chest tightness and wheezing.   Cardiovascular: Positive for leg swelling. Negative for palpitations.  Gastrointestinal: Negative for nausea and vomiting.  Genitourinary: Negative for dysuria.  Musculoskeletal: Negative for joint swelling.  Skin: Negative for rash.  Neurological: Negative for headaches.  Hematological: Does not bruise/bleed easily.  Psychiatric/Behavioral: Negative for dysphoric mood. The patient is not nervous/anxious.          Objective:   Physical Exam Constitutional:  Obese female, no acute distress  HENT:  Nares patent without discharge  Oropharynx without exudate, palate and uvula are elongated  Eyes:  Perrla, eomi, no scleral icterus  Neck:  No JVD, no TMG  Cardiovascular:  Normal rate, regular rhythm, ++gallop.  No murmurs        Intact distal pulses  Pulmonary :  Normal breath sounds, no stridor or respiratory distress   No rhonchi, or wheezing.  A few basilar crackles.  Abdominal:  Soft, nondistended, bowel sounds present.  No tenderness noted.   Musculoskeletal:  1+ lower extremity edema noted.  Lymph Nodes:  No cervical lymphadenopathy noted  Skin:  No cyanosis noted  Neurologic:  Alert, appropriate, moves all 4 extremities without obvious deficit.         Assessment & Plan:

## 2011-06-13 ENCOUNTER — Telehealth: Payer: Self-pay | Admitting: Family Medicine

## 2011-06-13 NOTE — Telephone Encounter (Signed)
Patient's daughter, Yolanda Huffman, calling, states patient saw Dr. Gwenette Greet on Friday, 06-10-11, per Dr. Nonda Lou referral for Pleural Effusion, and Dr. Gwenette Greet advised patient she should be seen by Cardiology for this.  (See Dr. Janifer Adie office note).  Dr. Lovena Le is booked into Feb-2013, so patient was scheduled to see his PA- Richardson Dopp on 07-04-2011.  Patient's daughter is requesting a referral back to Dr. Lovena Le, and states that seeing the PA is not acceptable, and waiting until January is also not acceptable.  She believes patient should be seen this week.  Please advise, and enter new referral to Cards.

## 2011-06-13 NOTE — Telephone Encounter (Signed)
Please advise      KP 

## 2011-06-13 NOTE — Telephone Encounter (Signed)
Can Yolanda Huffman call and see if anything can be done----please explain that the cardiologists work closely with PA so she would be well cared for

## 2011-06-13 NOTE — Telephone Encounter (Signed)
Discussed with Becky Sax and she voiced understanding     KP

## 2011-06-14 ENCOUNTER — Telehealth: Payer: Self-pay | Admitting: *Deleted

## 2011-06-14 ENCOUNTER — Encounter: Payer: Self-pay | Admitting: Physician Assistant

## 2011-06-14 ENCOUNTER — Ambulatory Visit (INDEPENDENT_AMBULATORY_CARE_PROVIDER_SITE_OTHER): Payer: Medicare Other | Admitting: Physician Assistant

## 2011-06-14 ENCOUNTER — Telehealth: Payer: Self-pay | Admitting: Family Medicine

## 2011-06-14 VITALS — BP 142/70 | Ht 63.0 in | Wt 188.0 lb

## 2011-06-14 DIAGNOSIS — R0600 Dyspnea, unspecified: Secondary | ICD-10-CM

## 2011-06-14 DIAGNOSIS — I4891 Unspecified atrial fibrillation: Secondary | ICD-10-CM

## 2011-06-14 DIAGNOSIS — I10 Essential (primary) hypertension: Secondary | ICD-10-CM

## 2011-06-14 DIAGNOSIS — R0609 Other forms of dyspnea: Secondary | ICD-10-CM

## 2011-06-14 DIAGNOSIS — I5043 Acute on chronic combined systolic (congestive) and diastolic (congestive) heart failure: Secondary | ICD-10-CM

## 2011-06-14 DIAGNOSIS — I2510 Atherosclerotic heart disease of native coronary artery without angina pectoris: Secondary | ICD-10-CM

## 2011-06-14 DIAGNOSIS — N189 Chronic kidney disease, unspecified: Secondary | ICD-10-CM

## 2011-06-14 DIAGNOSIS — R0989 Other specified symptoms and signs involving the circulatory and respiratory systems: Secondary | ICD-10-CM

## 2011-06-14 DIAGNOSIS — Z136 Encounter for screening for cardiovascular disorders: Secondary | ICD-10-CM

## 2011-06-14 LAB — CBC WITH DIFFERENTIAL/PLATELET
Basophils Absolute: 0 10*3/uL (ref 0.0–0.1)
Basophils Relative: 0.3 % (ref 0.0–3.0)
Eosinophils Absolute: 0.1 10*3/uL (ref 0.0–0.7)
HCT: 37.8 % (ref 36.0–46.0)
Hemoglobin: 12.7 g/dL (ref 12.0–15.0)
MCV: 86.7 fl (ref 78.0–100.0)
Monocytes Relative: 7.8 % (ref 3.0–12.0)
Platelets: 201 10*3/uL (ref 150.0–400.0)
RDW: 16.4 % — ABNORMAL HIGH (ref 11.5–14.6)
WBC: 6.7 10*3/uL (ref 4.5–10.5)

## 2011-06-14 LAB — BASIC METABOLIC PANEL
BUN: 27 mg/dL — ABNORMAL HIGH (ref 6–23)
CO2: 26 mEq/L (ref 19–32)
Chloride: 102 mEq/L (ref 96–112)
Creatinine, Ser: 1.8 mg/dL — ABNORMAL HIGH (ref 0.4–1.2)
Potassium: 3.1 mEq/L — ABNORMAL LOW (ref 3.5–5.1)

## 2011-06-14 LAB — BRAIN NATRIURETIC PEPTIDE: Pro B Natriuretic peptide (BNP): 672 pg/mL — ABNORMAL HIGH (ref 0.0–100.0)

## 2011-06-14 MED ORDER — POTASSIUM CHLORIDE CRYS ER 20 MEQ PO TBCR: 20.0000 meq | EXTENDED_RELEASE_TABLET | Freq: Every day | ORAL | Status: DC

## 2011-06-14 MED ORDER — FUROSEMIDE 40 MG PO TABS: ORAL_TABLET | ORAL | Status: DC

## 2011-06-14 NOTE — Patient Instructions (Addendum)
Your physician recommends that you schedule a follow-up appointment in: 1 week Your physician recommends that you return for lab work in: today (BMP, BNP. CBC) and Friday morning (BMP) Your physician has recommended you make the following change in your medication: increase lasix 40 mg 2 tabs in the morning and 1 tab around 1:00pm for 3 days then take 1 tab in the AM and 1 tab around 1:00pm Eat bananas for extra potassium for 3 days

## 2011-06-14 NOTE — Telephone Encounter (Signed)
That is fine 

## 2011-06-14 NOTE — Assessment & Plan Note (Addendum)
Somewhat elevated.  We will see how her blood pressure responds to diuresis.

## 2011-06-14 NOTE — Assessment & Plan Note (Signed)
Coumadin managed by primary care.

## 2011-06-14 NOTE — Assessment & Plan Note (Signed)
As noted, her renal function will be monitored with diuresis.

## 2011-06-14 NOTE — Assessment & Plan Note (Addendum)
Her weight is up from 169 pounds to 188 pounds since 4/12.  She has mild LV dysfunction.  She likely has diastolic dysfunction as well.  Heart failure is likely worsened by chronic kidney disease.  We have had a long discussion regarding limiting her salt.  I will increase her Furosemide to 80 mg in the morning and 40 mg in the afternoon for 2 days.  She will then decrease to 40 mg twice a day.  She will increase her dietary potassium for a few days.  We will check a basic metabolic panel, CBC and BNP today.  She was also seen by Dr. Lovena Le today.  She will be brought back later this week to repeat her basic metabolic panel to monitor her renal function and potassium.  She will be brought back next week for followup.  She knows to weigh herself daily.  If she notes increased weight, increased shortness of breath, increased swelling, she should go to the emergency room.

## 2011-06-14 NOTE — Assessment & Plan Note (Signed)
Stable.  No angina  

## 2011-06-14 NOTE — Telephone Encounter (Signed)
Ok with me 

## 2011-06-14 NOTE — Telephone Encounter (Signed)
Pt is req to change pcps from Dr Etter Sjogren at [facility] to Dr Elease Hashimoto at [facility], due to location closer to pts home. Pls advise if ok. Pt has MCR.

## 2011-06-14 NOTE — Telephone Encounter (Signed)
Potassium rich list given to her over phone, k+ tab called into pharm, pt has f/u lab set. Pt verbalized understanding. Corwin Levins RN

## 2011-06-14 NOTE — Progress Notes (Signed)
[place], [place]  [address] Phone: [phone] Fax:  [phone]  Date:  06/14/2011   Name:  Yolanda Huffman       DOB:  [birth year] MRN:  [id number]  PCP:  Dr. Etter Sjogren Primary Cardiologist:  Dr. Cristopher Peru  Primary Electrophysiologist:  Dr. Cristopher Peru    History of Present Illness: Yolanda Huffman is a 70 y.o. female who presents for evaluation of pleural effusion.  She has a history of atrial fibrillation, status post pacemaker, chronic Coumadin therapy, CAD, status post prior PCI in [place], [place] in 1/09, peptic ulcer disease, diabetes, hypertension, hyperlipidemia, chronic kidney disease, GERD.  She was hospitalized 3/12 with pneumonia and anemia secondary to duodenal ulcer bleeding.  She also developed acute on chronic renal failure.  Her sotalol was discontinued at that time due to poor renal fxn.  A rate control strategy has been continued since that time.  Echocardiogram 3/12: Mild LVH, EF [id number], normal diastolic function, mild AI, mild MR, PASP 44, normal wall motion.  She did develop volume overload during that admission.  She is an ex-smoker with a h/o chronic dyspnea.  She recently was seen by her PCP with complaints of cough and worsening dyspnea.  There was no improvement with antibiotics.  She was seen by pulmonary last week.  Her chest x-ray from 06/07/11 demonstrated increased small bilateral pleural effusions with compressive bilateral atelectasis (when compared with 3/12).  Dr. Gwenette Greet felt that this was likely related to congestive heart failure and recommended followup with cardiology.  Labs (10/12):  Na 134, K 3.2, creatinine 1.7, LDL 61, LFTs ok  Over the last 2 weeks, she has developed worsening dyspnea.  She has been sleeping in a recliner due to orthopnea.  She denies PND. Her lower extremities are more swollen.  She also notes increased abdominal girth birth.  She notes class III symptoms.  She feels weak.  She denies chest pain or syncope.   She denies dietary indiscretion with salt.  However, her daughter, who is with her today, notes that her diet is rich in salt.  Past Medical History  Diagnosis Date  . Diabetes mellitus   . Hyperlipemia   . Hypertension   . CAD (coronary artery disease)   . Pacemaker   . Atrial fibrillation   . Anemia   . Ulcer 10/2010    duodenal ulcer    Current Outpatient Prescriptions  Medication Sig Dispense Refill  . amLODipine (NORVASC) 10 MG tablet Take 1 tablet (10 mg total) by mouth daily.  30 tablet  2  . atorvastatin (LIPITOR) 20 MG tablet Take 1 tablet (20 mg total) by mouth daily.  30 tablet  0  . budesonide-formoterol (SYMBICORT) 160-4.5 MCG/ACT inhaler Inhale 1 puff into the lungs 2 (two) times daily.        . calcium carbonate (OS-CAL) 600 MG TABS Take 600 mg by mouth daily.        . Cholecalciferol (VITAMIN D) 400 UNIT/ML LIQD Take by mouth.        . colchicine 0.6 MG tablet Take 0.6 mg by mouth daily.        . fish oil-omega-3 fatty acids 1000 MG capsule Take 2 g by mouth daily.        . furosemide (LASIX) 40 MG tablet Take 1 tablet (40 mg total) by mouth daily.  60 tablet  1  . glucose blood test strip 1 each by Other route 2 (two) times daily. Use as  instructed       . insulin detemir (LEVEMIR) 100 UNIT/ML injection Inject 5 Units into the skin at bedtime. Only if blood sugar is above 100 after eating       . metoprolol succinate (TOPROL XL) 25 MG 24 hr tablet 2 po qam and 1 po qhs  90 tablet  5  . naproxen sodium (ALEVE) 220 MG tablet Take 220 mg by mouth as needed.       . pantoprazole (PROTONIX) 40 MG tablet Take 40 mg by mouth daily.       Marland Kitchen warfarin (COUMADIN) 5 MG tablet TAKE 1 TABLET BY MOUTH DAILY AS DIRECTED  30 tablet  3    Allergies: No Known Allergies   History  Substance Use Topics  . Smoking status: Former Smoker -- 2.0 packs/day for 20 years    Types: Cigarettes    Quit date: 06/28/1991  . Smokeless tobacco: Never Used  . Alcohol Use: No     ROS:   Please see the history of present illness.   She denies melena, hematochezia, hemoptysis, hematemesis.  All other systems reviewed and negative.   PHYSICAL EXAM: VS:  BP 142/70  Ht 5\' 3"  (1.6 m)  Wt 188 lb (85.276 kg)  BMI 33.30 kg/m2 Well nourished, well developed, in no acute distress HEENT: normal Neck: + JVD At 90 agrees Cardiac:  Distant S1, S2; RRR; no murmur; I cannot appreciate a gallop Lungs:  Decreased breath sounds bilaterally, no wheezing, rhonchi or rales; Positive egophony in bilateral bases Abd: soft, nontender Ext: Tight 2+ bilateral edema Skin: warm and dry Neuro:  CNs 2-12 intact, no focal abnormalities noted Psych: Normal affect  EKG:   V. Paced, heart rate 78  ASSESSMENT AND PLAN:

## 2011-06-14 NOTE — Telephone Encounter (Signed)
Message copied by Jonathon Jordan on Tue Jun 14, 2011  5:13 PM ------      Message from: [place], [place] T      Created: Tue Jun 14, 2011  4:54 PM       Add K+ 20 mEq QD.      Patient should have bmet pending on Friday am (12/21)      Richardson Dopp, PA-C  4:54 PM 06/14/2011

## 2011-06-14 NOTE — Progress Notes (Signed)
busy

## 2011-06-15 ENCOUNTER — Telehealth: Payer: Self-pay | Admitting: *Deleted

## 2011-06-15 ENCOUNTER — Telehealth: Payer: Self-pay | Admitting: Family Medicine

## 2011-06-15 NOTE — Telephone Encounter (Signed)
Previous INR's : 06/07/11 was 2.5      05/13/11 was 2.5      05/06/11 was 4.3          04/15/11 was 2.8    Please advise        KP

## 2011-06-15 NOTE — Telephone Encounter (Signed)
Called pts daughter and informed her that both drs have approved change of pcp. Pt has been schd for np to est with Dr Elease Hashimoto on 07/11/11 at 10:30am as noted.

## 2011-06-15 NOTE — Telephone Encounter (Signed)
Patient is transferring to [facility] office - which is closer to her home - her 1st visit is [phone] - she is scheduled at [facility] for coumadin check [phone] - patients daughter wants to know if she can wait until her ov at [facility] on [phone]

## 2011-06-15 NOTE — Telephone Encounter (Signed)
Pt's daughter was wondering about changing coumadin check.  She is going to call PCP

## 2011-06-15 NOTE — Telephone Encounter (Signed)
Yes that is fine

## 2011-06-16 NOTE — Telephone Encounter (Signed)
Discussed with Becky Sax and she voiced understanding     KP

## 2011-06-17 ENCOUNTER — Other Ambulatory Visit (INDEPENDENT_AMBULATORY_CARE_PROVIDER_SITE_OTHER): Payer: Medicare Other | Admitting: *Deleted

## 2011-06-17 DIAGNOSIS — R0989 Other specified symptoms and signs involving the circulatory and respiratory systems: Secondary | ICD-10-CM

## 2011-06-17 DIAGNOSIS — I5043 Acute on chronic combined systolic (congestive) and diastolic (congestive) heart failure: Secondary | ICD-10-CM

## 2011-06-17 DIAGNOSIS — R0600 Dyspnea, unspecified: Secondary | ICD-10-CM

## 2011-06-17 DIAGNOSIS — R0609 Other forms of dyspnea: Secondary | ICD-10-CM

## 2011-06-17 LAB — BASIC METABOLIC PANEL WITH GFR
BUN: 26 mg/dL — ABNORMAL HIGH (ref 6–23)
CO2: 28 meq/L (ref 19–32)
Calcium: 9 mg/dL (ref 8.4–10.5)
Chloride: 98 meq/L (ref 96–112)
Creatinine, Ser: 1.7 mg/dL — ABNORMAL HIGH (ref 0.4–1.2)
GFR: 31.07 mL/min — ABNORMAL LOW
Glucose, Bld: 139 mg/dL — ABNORMAL HIGH (ref 70–99)
Potassium: 3.2 meq/L — ABNORMAL LOW (ref 3.5–5.1)
Sodium: 136 meq/L (ref 135–145)

## 2011-06-20 ENCOUNTER — Telehealth: Payer: Self-pay | Admitting: Internal Medicine

## 2011-06-20 NOTE — Telephone Encounter (Signed)
New msg Pt has been taking lasix and she has lost weight on 3 pills. And now she is taking 2 pills and has stopped losing weight. Please call daughter back

## 2011-06-20 NOTE — Telephone Encounter (Signed)
Daughter called [facility]) concerned that her mother is not losing wt every day like she was told. States that her wt Friday was 184 so she is not losing wt every day. She sounds very anxious. Advised her that I would call Yolanda Huffman. Spoke w/ Yolanda Huffman. States her wt is 184 today but she is feeling better. Slight SOB and sl edema in ankles but is better. Is watching her salt intact. Is taking Lasix 40 mg BID. Did not appear to be SOB while talking with her. Spoke w/Dr. Julianne Handler (DOD) who states she doesn't have to lose wt every day and as long as she is feeling better would continue to take Lasix as prescribed. Advised Yolanda Huffman if she started to gain wt and became more SOB then she needed to call [facility]OD on call today or Christmas Day. She is scheduled to see Richardson Dopp on Thursday. She verbalizes understanding and seems to understand salt intact as related to her diet. Called her daughter back and relayed above information. Tried to reassure her and again to call if has concerns to DOD tomorrow or call here on Wednesday.

## 2011-06-23 ENCOUNTER — Other Ambulatory Visit: Payer: Self-pay

## 2011-06-23 ENCOUNTER — Other Ambulatory Visit: Payer: Self-pay | Admitting: Physician Assistant

## 2011-06-23 ENCOUNTER — Encounter: Payer: Self-pay | Admitting: Physician Assistant

## 2011-06-23 ENCOUNTER — Ambulatory Visit (INDEPENDENT_AMBULATORY_CARE_PROVIDER_SITE_OTHER): Payer: Medicare Other | Admitting: Physician Assistant

## 2011-06-23 DIAGNOSIS — I2510 Atherosclerotic heart disease of native coronary artery without angina pectoris: Secondary | ICD-10-CM

## 2011-06-23 DIAGNOSIS — E876 Hypokalemia: Secondary | ICD-10-CM

## 2011-06-23 DIAGNOSIS — N189 Chronic kidney disease, unspecified: Secondary | ICD-10-CM

## 2011-06-23 DIAGNOSIS — J90 Pleural effusion, not elsewhere classified: Secondary | ICD-10-CM

## 2011-06-23 DIAGNOSIS — I4891 Unspecified atrial fibrillation: Secondary | ICD-10-CM

## 2011-06-23 DIAGNOSIS — I10 Essential (primary) hypertension: Secondary | ICD-10-CM

## 2011-06-23 DIAGNOSIS — I5043 Acute on chronic combined systolic (congestive) and diastolic (congestive) heart failure: Secondary | ICD-10-CM

## 2011-06-23 NOTE — Assessment & Plan Note (Signed)
No angina 

## 2011-06-23 NOTE — Assessment & Plan Note (Signed)
She has labs pending for nephrology and a couple of weeks.  We will obtain those labs at her blood draw next week and send them to Dr. Arty Baumgartner.

## 2011-06-23 NOTE — Patient Instructions (Signed)
Your physician has recommended you make the following change in your medication: INCREASE Lasix to 40 mg, two tablets in the morning, and one tablet in the afternoon.    Your physician recommends that you return for lab work in: 1 week for a BMET,BNP, at this time we will add labs from [facility].  Request pending.    Your physician recommends that you schedule a follow-up appointment in: 4 weeks with Dr. Lovena Le, or with Richardson Dopp, PA on same day that Dr. Lovena Le is in the office.

## 2011-06-23 NOTE — Assessment & Plan Note (Signed)
Controlled.  

## 2011-06-23 NOTE — Assessment & Plan Note (Addendum)
Slowly improving.  However, she has more volume to lose.  Her weight loss has been minimal.  Increase Lasix to 80 mg in the morning and 40 mg in the afternoon.  Followup basic metabolic panel and BMP in one week.  She can followup with Dr. Lovena Le or me in 4 weeks.

## 2011-06-23 NOTE — Progress Notes (Signed)
[place], [place]  [address] Phone: [phone] Fax:  [phone]  Date:  06/23/2011   Name:  Yolanda Huffman       DOB:  28-Apr-1941 MRN:  [id number]  PCP:  Dr. Etter Sjogren Primary Cardiologist:  Dr. Cristopher Peru  Primary Electrophysiologist:  Dr. Cristopher Peru    History of Present Illness: Yolanda Huffman is a 70 y.o. female who presents for follow up.  She has a history of atrial fibrillation, status post pacemaker, chronic Coumadin therapy, CAD, status post prior PCI in [place], [place] in 1/09, peptic ulcer disease, diabetes, hypertension, hyperlipidemia, chronic kidney disease, GERD.  She was hospitalized 3/12 with pneumonia and anemia secondary to duodenal ulcer bleeding.  She also developed acute on chronic renal failure.  Her sotalol was discontinued at that time due to poor renal fxn.  A rate control strategy has been continued since that time.  Echocardiogram 3/12: Mild LVH, EF Q000111Q, normal diastolic function, mild AI, mild MR, PASP 44, normal wall motion.  She did develop volume overload during that admission.  She is an ex-smoker with a h/o chronic dyspnea.  She recently was seen by her PCP with complaints of cough and worsening dyspnea.  There was no improvement with antibiotics.  She was seen by pulmonary.  Her chest x-ray from 06/07/11 demonstrated increased small bilateral pleural effusions with compressive bilateral atelectasis (when compared with 3/12).  Dr. Gwenette Greet felt that this was likely related to congestive heart failure and recommended followup with cardiology.  I saw her 12/18.  She noted problems with orthopnea, lower extremity edema, increased abdominal girth and class III dyspnea.  She also admitted to dietary indiscretion with salt.  She was also seen by Dr. Lovena Le that day.  We felt she had a picture of acute on chronic combined systolic and diastolic heart failure.  This was likely worsened by her chronic kidney disease.  We asked her to limit  her salt.  We adjusted her diuretic.    Labs:  K 3.1 => 3.2;  Creatinine 1.8 => 1.7, BNP 672 (K+ added to meds and increased to 40 mEq QD yesterday).  Overall, she is doing better.  However, still notes DOE.  Probably Class 2b now.  Still sleeping in a recliner b/c she wants to.  Has tried to lay flat and feels ok with this.  She denies chest pain.  No syncope.  LE edema is slightly improved.  Had weight loss initially, then stagnated after she went to Lasix 40 bid.  She is limiting salt.    Past Medical History  Diagnosis Date  . Diabetes mellitus   . Hyperlipemia   . Hypertension   . CAD (coronary artery disease)   . Pacemaker   . Atrial fibrillation   . Anemia   . Ulcer 10/2010    duodenal ulcer    Current Outpatient Prescriptions  Medication Sig Dispense Refill  . amLODipine (NORVASC) 10 MG tablet Take 1 tablet (10 mg total) by mouth daily.  30 tablet  2  . atorvastatin (LIPITOR) 20 MG tablet Take 1 tablet (20 mg total) by mouth daily.  30 tablet  0  . calcium carbonate (OS-CAL) 600 MG TABS Take 600 mg by mouth daily.        . Cholecalciferol (VITAMIN D) 400 UNIT/ML LIQD Take by mouth.        . colchicine 0.6 MG tablet Take 0.6 mg by mouth daily.        Marland Kitchen  fish oil-omega-3 fatty acids 1000 MG capsule Take 2 g by mouth daily.        . furosemide (LASIX) 40 MG tablet Take 1 tab twice daily and as needed  75 tablet  1  . glucose blood test strip 1 each by Other route 2 (two) times daily. Use as instructed       . insulin detemir (LEVEMIR) 100 UNIT/ML injection Inject 5 Units into the skin at bedtime. Only if blood sugar is above 100 after eating       . metoprolol succinate (TOPROL XL) 25 MG 24 hr tablet 2 po qam and 1 po qhs  90 tablet  5  . naproxen sodium (ALEVE) 220 MG tablet Take 220 mg by mouth as needed.       . pantoprazole (PROTONIX) 40 MG tablet Take 40 mg by mouth daily.       . potassium chloride SA (K-DUR,KLOR-CON) 20 MEQ tablet Take 20 mEq by mouth 2 (two) times daily.         Marland Kitchen warfarin (COUMADIN) 5 MG tablet TAKE 1 TABLET BY MOUTH DAILY AS DIRECTED  30 tablet  3    Allergies: No Known Allergies   History  Substance Use Topics  . Smoking status: Former Smoker -- 2.0 packs/day for 20 years    Types: Cigarettes    Quit date: 06/28/1991  . Smokeless tobacco: Never Used  . Alcohol Use: No     PHYSICAL EXAM: VS:  BP 128/66  Pulse 80  Ht 5' 3.5" (1.613 m)  Wt 186 lb (84.369 kg)  BMI 32.43 kg/m2 Well nourished, well developed, in no acute distress HEENT: normal Neck: I cannot appreciate JVD at 90 agrees Cardiac:  Distant S1, S2; RRR; no murmur; I cannot appreciate a gallop Lungs:  Decreased breath sounds bilateral bases, no wheezing, rhonchi or rales Abd: soft, nontender Ext: Tight 2+ bilateral edema Skin: warm and dry Neuro:  CNs 2-12 intact, no focal abnormalities noted Psych: Normal affect  ASSESSMENT AND PLAN:

## 2011-06-23 NOTE — Assessment & Plan Note (Signed)
She is slow to diurese.  Will likely arrange follow up CXR at follow up.

## 2011-06-23 NOTE — Assessment & Plan Note (Signed)
She remains on coumadin with good rate control.

## 2011-06-29 ENCOUNTER — Telehealth: Payer: Self-pay | Admitting: Internal Medicine

## 2011-06-29 NOTE — Telephone Encounter (Signed)
Pt has an appt Friday for lab work and fluid pills have been increased but pt has not lost any weight and daughter just wanted to make you aware

## 2011-06-29 NOTE — Telephone Encounter (Signed)
Has not lost any weight with the increased dose of fluid pill  Will call daughter back tomorrow after talking with Margaret Pyle

## 2011-06-30 NOTE — Telephone Encounter (Signed)
Called and left a message for patient to call me back in regards to her symptoms to see if they are the same of have gotten better of worse

## 2011-06-30 NOTE — Telephone Encounter (Signed)
This morning she is down to 179 from 184.  The SOB is better, swelling has gone down some, she is still sleeping in chair but doing this because she wants to not because she has to.  She can lay in the bed if needed. Called and left message with Mrs Eland's daughter and let her know we are not going to make any changes at this point because she is doing better and her weight is down

## 2011-07-01 ENCOUNTER — Ambulatory Visit (INDEPENDENT_AMBULATORY_CARE_PROVIDER_SITE_OTHER): Payer: Medicare Other | Admitting: *Deleted

## 2011-07-01 ENCOUNTER — Other Ambulatory Visit: Payer: Self-pay

## 2011-07-01 DIAGNOSIS — E876 Hypokalemia: Secondary | ICD-10-CM

## 2011-07-01 DIAGNOSIS — R0602 Shortness of breath: Secondary | ICD-10-CM

## 2011-07-01 DIAGNOSIS — N189 Chronic kidney disease, unspecified: Secondary | ICD-10-CM

## 2011-07-01 DIAGNOSIS — D649 Anemia, unspecified: Secondary | ICD-10-CM

## 2011-07-01 LAB — URINALYSIS, ROUTINE W REFLEX MICROSCOPIC
Bilirubin Urine: NEGATIVE
Ketones, ur: NEGATIVE
Nitrite: NEGATIVE
Urobilinogen, UA: 0.2 (ref 0.0–1.0)

## 2011-07-01 LAB — CBC WITH DIFFERENTIAL/PLATELET
Basophils Absolute: 0 10*3/uL (ref 0.0–0.1)
Eosinophils Relative: 1.7 % (ref 0.0–5.0)
HCT: 38.2 % (ref 36.0–46.0)
Lymphs Abs: 0.6 10*3/uL — ABNORMAL LOW (ref 0.7–4.0)
Monocytes Absolute: 0.6 10*3/uL (ref 0.1–1.0)
Monocytes Relative: 9.7 % (ref 3.0–12.0)
Neutrophils Relative %: 77.1 % — ABNORMAL HIGH (ref 43.0–77.0)
Platelets: 188 10*3/uL (ref 150.0–400.0)
RDW: 16.5 % — ABNORMAL HIGH (ref 11.5–14.6)
WBC: 5.9 10*3/uL (ref 4.5–10.5)

## 2011-07-01 LAB — PHOSPHORUS: Phosphorus: 3.6 mg/dL (ref 2.3–4.6)

## 2011-07-01 LAB — FERRITIN: Ferritin: 50 ng/mL (ref 10.0–291.0)

## 2011-07-01 LAB — IBC PANEL
Iron: 48 ug/dL (ref 42–145)
Saturation Ratios: 14.2 % — ABNORMAL LOW (ref 20.0–50.0)

## 2011-07-01 LAB — HEPATIC FUNCTION PANEL
AST: 25 U/L (ref 0–37)
Albumin: 3.5 g/dL (ref 3.5–5.2)
Total Bilirubin: 1.1 mg/dL (ref 0.3–1.2)

## 2011-07-01 LAB — BRAIN NATRIURETIC PEPTIDE: Pro B Natriuretic peptide (BNP): 541 pg/mL — ABNORMAL HIGH (ref 0.0–100.0)

## 2011-07-01 LAB — BASIC METABOLIC PANEL
BUN: 31 mg/dL — ABNORMAL HIGH (ref 6–23)
Calcium: 9.3 mg/dL (ref 8.4–10.5)
Creatinine, Ser: 1.8 mg/dL — ABNORMAL HIGH (ref 0.4–1.2)

## 2011-07-02 LAB — PROTEIN / CREATININE RATIO, URINE
Creatinine, Urine: 43.1 mg/dL
Protein Creatinine Ratio: 0.44 — ABNORMAL HIGH (ref <0.15)
Total Protein, Urine: 19 mg/dL

## 2011-07-04 ENCOUNTER — Ambulatory Visit: Payer: Medicare Other | Admitting: Physician Assistant

## 2011-07-04 ENCOUNTER — Telehealth: Payer: Self-pay | Admitting: Physician Assistant

## 2011-07-04 NOTE — Telephone Encounter (Signed)
Patient aware of lab results. Also she is aware about results being  faxed to Dr. Marval Regal MD  Nephrology services.

## 2011-07-04 NOTE — Telephone Encounter (Signed)
Fu call °Pt returning your call  °

## 2011-07-05 ENCOUNTER — Ambulatory Visit: Payer: Medicare Other

## 2011-07-05 LAB — IRON AND TIBC

## 2011-07-11 ENCOUNTER — Encounter: Payer: Self-pay | Admitting: Family Medicine

## 2011-07-11 ENCOUNTER — Ambulatory Visit (INDEPENDENT_AMBULATORY_CARE_PROVIDER_SITE_OTHER): Payer: Medicare Other | Admitting: Family Medicine

## 2011-07-11 DIAGNOSIS — I10 Essential (primary) hypertension: Secondary | ICD-10-CM

## 2011-07-11 DIAGNOSIS — N189 Chronic kidney disease, unspecified: Secondary | ICD-10-CM

## 2011-07-11 DIAGNOSIS — I2510 Atherosclerotic heart disease of native coronary artery without angina pectoris: Secondary | ICD-10-CM

## 2011-07-11 DIAGNOSIS — E119 Type 2 diabetes mellitus without complications: Secondary | ICD-10-CM

## 2011-07-11 DIAGNOSIS — I4891 Unspecified atrial fibrillation: Secondary | ICD-10-CM

## 2011-07-11 DIAGNOSIS — E785 Hyperlipidemia, unspecified: Secondary | ICD-10-CM

## 2011-07-11 LAB — HEMOGLOBIN A1C: Hgb A1c MFr Bld: 6.5 % (ref 4.6–6.5)

## 2011-07-11 NOTE — Progress Notes (Signed)
Quick Note:  Pt aware of results. ______ 

## 2011-07-11 NOTE — Progress Notes (Signed)
Subjective:    Patient ID: Yolanda Huffman, female    DOB: [birth year], 71 y.o.   MRN: [id number]  HPI  Patient seen to establish care here. Previously, has seen another primary care office in [place].  She has multiple medical problems including obesity, hypertension, hyperlipidemia, chronic lower extremity edema, type 2 diabetes, CAD, chronic kidney disease, atrial fibrillation, and history of systolic and diastolic heart failure. Her medications are reviewed. She's had a recent increase in Lasix which has helped her edema situation. She has some chronic dyspnea with exertion which is unchanged. No recent chest pain.  Diabetes and takes low-dose Levemir usually 5 units daily. No recent hypoglycemia. Last A1c 8.1%. Needs repeat pro time today. On Coumadin with dose of 5 mg Monday and Friday and 2.5 mg all other days. No recent bleeding complications. Has pacemaker. No recent dizziness or syncope.  Past Medical History  Diagnosis Date  . Diabetes mellitus   . Hyperlipemia   . Hypertension   . CAD (coronary artery disease)   . Pacemaker   . Atrial fibrillation   . Anemia   . Ulcer 10/2010    duodenal ulcer   Past Surgical History  Procedure Date  . Pacemaker insertion 07/13/07  . Cataract extraction     01/12/09 and 01/26/09 both eyes  . Stent implant 07/13/2007    reports that she quit smoking about 20 years ago. Her smoking use included Cigarettes. She has a 40 pack-year smoking history. She has never used smokeless tobacco. She reports that she does not drink alcohol or use illicit drugs. family history includes Breast cancer in her maternal aunt and sister; Clotting disorder in her father; Heart attack in her father; Heart disease in her father and mother; Hypertension in her father; and Other in her mother. No Known Allergies     Review of Systems  Constitutional: Negative for fever, chills, appetite change and unexpected weight change.  HENT: Negative for trouble swallowing.     Respiratory: Positive for shortness of breath. Negative for cough and wheezing.   Cardiovascular: Positive for leg swelling. Negative for chest pain and palpitations.  Gastrointestinal: Negative for abdominal pain.  Genitourinary: Negative for dysuria.  Neurological: Negative for dizziness and syncope.  Psychiatric/Behavioral: Negative for confusion.       Objective:   Physical Exam  Constitutional: She is oriented to person, place, and time. She appears well-developed and well-nourished.  HENT:  Right Ear: External ear normal.  Left Ear: External ear normal.  Mouth/Throat: Oropharynx is clear and moist.  Neck: Neck supple. No thyromegaly present.  Cardiovascular: Normal rate and regular rhythm.  Exam reveals no gallop.   Pulmonary/Chest: Effort normal and breath sounds normal. No respiratory distress. She has no wheezes. She has no rales.  Abdominal: Soft. There is no tenderness.  Musculoskeletal: She exhibits edema.       Trace to 1+ pitting edema lower legs bilaterally  Feet reveal no lesions. Feet reveal no skin lesions. Good distal foot pulses. Good capillary refill. No calluses. Normal sensation with monofilament testing   Lymphadenopathy:    She has no cervical adenopathy.  Neurological: She is alert and oriented to person, place, and time. No cranial nerve deficit.  Skin: No rash noted.  Psychiatric: She has a normal mood and affect. Her behavior is normal.          Assessment & Plan:  #1 type 2 diabetes. Recheck A1c #2 atrial fibrillation. Repeat INR today. #3 hypertension stable continue current medications #4 chronic  kidney disease. Followed by nephrology. Recent labs including basic metabolic panel reviewed  #5 history of systolic and diastolic heart failure symptomatically stable #6 hyperlipidemia #7 history of CAD #8 past history of anemia with recent hemoglobin normal

## 2011-07-11 NOTE — Patient Instructions (Addendum)
Continue to monitor weight and be in touch if increase in weight over 5 pounds over a few days    Latest dosing instructions   Total Sun Mon Tue Wed Thu Fri Sat   22.5 2.5 mg 5 mg 2.5 mg 2.5 mg 2.5 mg 5 mg 2.5 mg    (5 mg0.5) (5 mg1) (5 mg0.5) (5 mg0.5) (5 mg0.5) (5 mg1) (5 mg0.5)

## 2011-07-19 ENCOUNTER — Encounter: Payer: Self-pay | Admitting: Physician Assistant

## 2011-07-19 ENCOUNTER — Ambulatory Visit (INDEPENDENT_AMBULATORY_CARE_PROVIDER_SITE_OTHER): Payer: Medicare Other | Admitting: Physician Assistant

## 2011-07-19 ENCOUNTER — Encounter: Payer: Self-pay | Admitting: Internal Medicine

## 2011-07-19 ENCOUNTER — Ambulatory Visit (INDEPENDENT_AMBULATORY_CARE_PROVIDER_SITE_OTHER): Payer: Medicare Other | Admitting: *Deleted

## 2011-07-19 VITALS — BP 136/62 | HR 72 | Resp 18 | Ht 62.0 in | Wt 189.0 lb

## 2011-07-19 DIAGNOSIS — R0989 Other specified symptoms and signs involving the circulatory and respiratory systems: Secondary | ICD-10-CM

## 2011-07-19 DIAGNOSIS — Z950 Presence of cardiac pacemaker: Secondary | ICD-10-CM

## 2011-07-19 DIAGNOSIS — I5043 Acute on chronic combined systolic (congestive) and diastolic (congestive) heart failure: Secondary | ICD-10-CM

## 2011-07-19 DIAGNOSIS — I4891 Unspecified atrial fibrillation: Secondary | ICD-10-CM

## 2011-07-19 LAB — PACEMAKER DEVICE OBSERVATION
AL IMPEDENCE PM: 385 Ohm
ATRIAL PACING PM: 72
BATTERY VOLTAGE: 2.79 V
BRDY-0002RV: 70 {beats}/min
BRDY-0004RV: 120 {beats}/min
RV LEAD AMPLITUDE: 4 mv
RV LEAD THRESHOLD: 1.25 V

## 2011-07-19 LAB — BASIC METABOLIC PANEL
BUN: 26 mg/dL — ABNORMAL HIGH (ref 6–23)
CO2: 28 mEq/L (ref 19–32)
Chloride: 98 mEq/L (ref 96–112)
Creatinine, Ser: 1.6 mg/dL — ABNORMAL HIGH (ref 0.4–1.2)
Potassium: 3.1 mEq/L — ABNORMAL LOW (ref 3.5–5.1)

## 2011-07-19 LAB — BRAIN NATRIURETIC PEPTIDE: Pro B Natriuretic peptide (BNP): 699 pg/mL — ABNORMAL HIGH (ref 0.0–100.0)

## 2011-07-19 MED ORDER — FUROSEMIDE 40 MG PO TABS: ORAL_TABLET | ORAL | Status: DC

## 2011-07-19 MED ORDER — POTASSIUM CHLORIDE CRYS ER 20 MEQ PO TBCR: EXTENDED_RELEASE_TABLET | ORAL | Status: DC

## 2011-07-19 MED ORDER — METOLAZONE 2.5 MG PO TABS: ORAL_TABLET | ORAL | Status: DC

## 2011-07-19 NOTE — Progress Notes (Signed)
[place], [place]  [address] Phone: [phone] Fax:  [phone]  Date:  07/19/2011   Name:  Yolanda Huffman       DOB:  [birth year] MRN:  [id number]  PCP:  Dr. Etter Sjogren Primary Cardiologist:  Dr. Cristopher Peru  Primary Electrophysiologist:  Dr. Cristopher Peru    History of Present Illness: Yolanda Huffman is a 71 y.o. female who presents for follow up.  She has a history of atrial fibrillation, status post pacemaker, chronic Coumadin therapy, CAD, status post prior PCI in [place], [place] in 1/09, peptic ulcer disease, diabetes, hypertension, hyperlipidemia, chronic kidney disease, GERD.  She was hospitalized 3/12 with pneumonia and anemia secondary to duodenal ulcer bleeding.  She also developed acute on chronic renal failure.  Her sotalol was discontinued at that time due to poor renal fxn. A rate control strategy has been continued since that time.  Echocardiogram 3/12: Mild LVH, EF Q000111Q, normal diastolic function, mild AI, mild MR, PASP 44, normal wall motion.  She did develop volume overload during that admission.  She is an ex-smoker with a h/o chronic dyspnea.  She recently was seen by her PCP with complaints of cough and worsening dyspnea.  There was no improvement with antibiotics.  She was seen by pulmonary.  Her chest x-ray from 06/07/11 demonstrated increased small bilateral pleural effusions with compressive bilateral atelectasis (when compared with 3/12).  Dr. Gwenette Greet felt that this was likely related to congestive heart failure and recommended followup with cardiology.  I saw her 12/18 and again on 12/27.  Her Lasix was adjusted each time.  She has slowly been improving.    Sodium  Date Value Range Status  07/01/2011 136  135-145 (mEq/L) Final  06/17/2011 136  135-145 (mEq/L) Final  06/14/2011 138  135-145 (mEq/L) Final     Potassium  Date Value Range Status  07/01/2011 3.6  3.5-5.1 (mEq/L) Final  06/17/2011 3.2* 3.5-5.1 (mEq/L) Final  06/14/2011 3.1*  3.5-5.1 (mEq/L) Final     BUN  Date Value Range Status  07/01/2011 31* 6-23 (mg/dL) Final  06/17/2011 26* 6-23 (mg/dL) Final  06/14/2011 27* 6-23 (mg/dL) Final     Creatinine, Ser  Date Value Range Status  07/01/2011 1.8* 0.4-1.2 (mg/dL) Final  06/17/2011 1.7* 0.4-1.2 (mg/dL) Final  06/14/2011 1.8* 0.4-1.2 (mg/dL) Final   Her weights are fairly stagnant.  Breathing is slowly improving.  Edema is about the same.  No orthopnea or PND.  No chest pain.  No syncope.  She has not felt at her baseline with her breathing since around Thanksgiving.    Past Medical History  Diagnosis Date  . Diabetes mellitus   . Hyperlipemia   . Hypertension   . CAD (coronary artery disease)     s/p PCI in [place] in 1/09  . Pacemaker   . Atrial fibrillation     prior Sotalol - d/c'd 2/2 increased creatinine; rate control strategy  . Anemia   . PUD (peptic ulcer disease) 10/2010    duodenal ulcer  . CKD (chronic kidney disease)   . Chronic combined systolic and diastolic heart failure     Current Outpatient Prescriptions  Medication Sig Dispense Refill  . amLODipine (NORVASC) 10 MG tablet Take 1 tablet (10 mg total) by mouth daily.  30 tablet  2  . atorvastatin (LIPITOR) 20 MG tablet Take 1 tablet (20 mg total) by mouth daily.  30 tablet  0  . calcium carbonate (OS-CAL) 600 MG TABS Take 600 mg  by mouth daily.        . Cholecalciferol (VITAMIN D) 400 UNIT/ML LIQD Take by mouth.        . colchicine 0.6 MG tablet Take 0.6 mg by mouth daily.        . fish oil-omega-3 fatty acids 1000 MG capsule Take 2 g by mouth daily.        . furosemide (LASIX) 40 MG tablet Take 1 tab twice daily and as needed  75 tablet  1  . glucose blood test strip 1 each by Other route 2 (two) times daily. Use as instructed       . insulin detemir (LEVEMIR) 100 UNIT/ML injection Inject 5 Units into the skin at bedtime. Only if blood sugar is above 100 after eating       . metoprolol succinate (TOPROL XL) 25 MG 24 hr tablet 2 po qam  and 1 po qhs  90 tablet  5  . naproxen sodium (ALEVE) 220 MG tablet Take 220 mg by mouth as needed.       . pantoprazole (PROTONIX) 40 MG tablet Take 40 mg by mouth daily.       . potassium chloride SA (K-DUR,KLOR-CON) 20 MEQ tablet Take 1 tablet (20 mEq total) by mouth 2 (two) times daily.  60 tablet  5  . potassium chloride SA (K-DUR,KLOR-CON) 20 MEQ tablet Take 20 mEq by mouth 2 (two) times daily.        Marland Kitchen warfarin (COUMADIN) 5 MG tablet TAKE 1 TABLET BY MOUTH DAILY AS DIRECTED  30 tablet  3    Allergies: No Known Allergies   History  Substance Use Topics  . Smoking status: Former Smoker -- 2.0 packs/day for 20 years    Types: Cigarettes    Quit date: 06/28/1991  . Smokeless tobacco: Never Used  . Alcohol Use: No     PHYSICAL EXAM: VS:  BP 136/62  Resp 18  Ht 5\' 2"  (1.575 m)  Wt 189 lb (85.73 kg)  BMI 34.57 kg/m2 Well nourished, well developed, in no acute distress HEENT: normal Neck: + JVD at 90 agrees Cardiac:  Distant S1, S2; RRR; no murmur; I cannot appreciate a gallop Lungs:  Decreased breath sounds bilateral bases, no wheezing, rhonchi or rales Abd: soft, nontender Ext: 1-2+ bilateral edema Skin: warm and dry Neuro:  CNs 2-12 intact, no focal abnormalities noted Psych: Normal affect  EKG:   V paced, HR 72, PVCs.  Pacer Interrogation:  AFib 68% of the time; Vent rates well controlled   ASSESSMENT AND PLAN:

## 2011-07-19 NOTE — Assessment & Plan Note (Signed)
Her volume is still elevated.  I suspect volume management is impacted by her chronic kidney disease.  She sees her nephrologist next week.  For now, I have suggested we try metolazone 2.5 mg on Monday, Wednesday and Fridays only.  She will take an extra potassium 20 mEq on these days.  Check a basic metabolic panel and BNP today.  Repeat those tests in one week (07/25/11).  If her edema and dyspnea do not respond to these measures, perhaps her nephrologist can help somewhat with her diuresis.  Last echocardiogram was in March 2012.  There is no need to repeat this at this point in time.  Followup with Dr. Lovena Le in one month.

## 2011-07-19 NOTE — Assessment & Plan Note (Signed)
No angina.  At this point, there is no indication for stress testing.

## 2011-07-19 NOTE — Assessment & Plan Note (Signed)
As noted, monitor renal function and potassium closely with the adjustment of diuretics.

## 2011-07-19 NOTE — Assessment & Plan Note (Signed)
Rate controlled.  She remains on Coumadin.

## 2011-07-19 NOTE — Progress Notes (Signed)
Pacemaker check in clinic

## 2011-07-19 NOTE — Patient Instructions (Signed)
Your physician recommends that you schedule a follow-up appointment in: [place], PT HAS SEEN PA X3 AND NEEDS TO SEE THE DR. Lovena Le PER SCOTT WEAVER, PA-C  Your physician has recommended you make the following change in your medication: START METOLAZONE 2.5 MG TABLET, YOU WILL TAKE THIS ON M, W, FRI'S WHEN YOU TAKE YOUR LASIX, ALSO ON THESE DAYS YOU WILL TAKE AN EXTRA POTASSIUM AS WELL AS YOUR CURRENT DOSE OF POTASSIUM.  Your physician recommends that you return for lab work in: TODAY BMET, BNP , CHRONIC KIDNEY DISEASE, CAD  Your physician recommends that you return for lab work in: [place], BNP ON 07/25/11

## 2011-07-20 ENCOUNTER — Telehealth: Payer: Self-pay | Admitting: Internal Medicine

## 2011-07-20 NOTE — Telephone Encounter (Signed)
pt wanted to verify the new dose of her K+, wanted over the new dose changes with pt today and she verbally read back to me x 3 with understanding today. Yolanda Huffman

## 2011-07-20 NOTE — Telephone Encounter (Signed)
New Problem   Patient returning patient calls, please return call @ hm#

## 2011-07-20 NOTE — Telephone Encounter (Signed)
pt wanted to verify the new dose of her K+, wanted over the new dose changes with pt today and she verbally read back to me x 3 with understanding today. Julaine Hua

## 2011-07-25 ENCOUNTER — Other Ambulatory Visit (INDEPENDENT_AMBULATORY_CARE_PROVIDER_SITE_OTHER): Payer: Medicare Other | Admitting: *Deleted

## 2011-07-25 DIAGNOSIS — I5043 Acute on chronic combined systolic (congestive) and diastolic (congestive) heart failure: Secondary | ICD-10-CM

## 2011-07-25 LAB — BASIC METABOLIC PANEL
CO2: 32 mEq/L (ref 19–32)
Chloride: 94 mEq/L — ABNORMAL LOW (ref 96–112)
Creatinine, Ser: 2 mg/dL — ABNORMAL HIGH (ref 0.4–1.2)
Potassium: 3.4 mEq/L — ABNORMAL LOW (ref 3.5–5.1)
Sodium: 137 mEq/L (ref 135–145)

## 2011-07-25 LAB — BRAIN NATRIURETIC PEPTIDE: Pro B Natriuretic peptide (BNP): 590 pg/mL — ABNORMAL HIGH (ref 0.0–100.0)

## 2011-07-26 ENCOUNTER — Telehealth: Payer: Self-pay | Admitting: Internal Medicine

## 2011-07-26 NOTE — Telephone Encounter (Signed)
FU call: Pt returning call from [place]. Please return pt call to discuss further.

## 2011-07-27 ENCOUNTER — Telehealth: Payer: Self-pay | Admitting: *Deleted

## 2011-07-27 ENCOUNTER — Encounter: Payer: Self-pay | Admitting: Physician Assistant

## 2011-07-27 NOTE — Telephone Encounter (Signed)
Pt notified of results today. Yolanda Huffman

## 2011-07-27 NOTE — Telephone Encounter (Signed)
Hi Yolanda Huffman, sorry to bother you but Yolanda Huffman called back to say she was confused about the K+. I tried to explain it to her but I want to make sure I have told her correctly as well. Please look @ both of your recommendations from her labs and let me know is she now supposed to add another 1 1/2 tabs on Metolazone days so that would be 5 1/2 pills on those days because you had increased her already to 40 bid on m,w,f . Please let me know exactly. I told pt I would call her tomorrow. Julaine Hua

## 2011-07-27 NOTE — Telephone Encounter (Signed)
Notes Recorded by Liliane Shi, PA on 07/25/2011 at 10:01 PM Take total of 30 mEq extra K+ on metolazone days (currently she takes 20 mEq extra) Repeat BMET later this week - on Thursday Fax labs to her nephrologist (Dr. Arty Baumgartner) Richardson Dopp, PA-C 10:01 PM 07/25/2011              Notes Recorded by Liliane Shi, PA on 07/19/2011 at 5:19 PM Change daily K+ to 40 mEq in the AM and 20 mEq in the PM On Mon, Wed, Fri - she will take 40 mEq BID (with the metolazone) Repeat BMET on 1/28 as noted Richardson Dopp, PA-C 5:19 PM 07/19/2011  Hi Scott, sorry to bother you but Miss Lyster called back to say she was confused about the K+. I tried to explain it to her but I want to make sure I have told her correctly as well. Please look @ both of your recommendations from her labs and let me know is she now supposed to add another 1 1/2 tabs on Metolazone days so that would be 5 1/2 pills on those days because you had increased her already to 40 bid on m,w,f . Please let me know exactly. I told pt I would call her tomorrow. Julaine Hua

## 2011-07-27 NOTE — Telephone Encounter (Signed)
pt notified of lab results and will have repeat bmet @ Dr. Elissa Hefty office today, pt gave verbal understanding today. Julaine Hua

## 2011-07-27 NOTE — Telephone Encounter (Signed)
FU Call: Pt returning call from [place]. Please return pt call to discuss further.

## 2011-07-28 ENCOUNTER — Telehealth: Payer: Self-pay | Admitting: *Deleted

## 2011-07-28 NOTE — Telephone Encounter (Signed)
s/w SW today and clarified K+ dose on metalozone days and cb pt and went over instructions w/pt, she read back instructions to me x 3 w/verbal understanding. Yolanda Huffman

## 2011-07-29 ENCOUNTER — Telehealth: Payer: Self-pay | Admitting: Internal Medicine

## 2011-07-29 NOTE — Telephone Encounter (Signed)
New problem Pt said saw Dr Donato Heinz on wednesday and he took her off amlodipine. She wanted you to know. Please call

## 2011-07-29 NOTE — Telephone Encounter (Signed)
Left pt a message that the doctor made the changes based on her kidneys probably  As long as her BP is stable this is fine  She can call us back and let us know if she has any problems with her BP

## 2011-08-11 ENCOUNTER — Ambulatory Visit (INDEPENDENT_AMBULATORY_CARE_PROVIDER_SITE_OTHER): Payer: Medicare Other | Admitting: Family Medicine

## 2011-08-11 DIAGNOSIS — I4891 Unspecified atrial fibrillation: Secondary | ICD-10-CM

## 2011-08-11 LAB — POCT INR: INR: 2

## 2011-08-11 NOTE — Patient Instructions (Signed)
  Latest dosing instructions   Total Yolanda Huffman Tue Wed Thu Fri Sat   25 2.5 mg 5 mg 2.5 mg 5 mg 2.5 mg 5 mg 2.5 mg    (5 mg0.5) (5 mg1) (5 mg0.5) (5 mg1) (5 mg0.5) (5 mg1) (5 mg0.5)

## 2011-08-12 ENCOUNTER — Other Ambulatory Visit: Payer: Self-pay | Admitting: *Deleted

## 2011-08-12 MED ORDER — PANTOPRAZOLE SODIUM 40 MG PO TBEC: 40.0000 mg | DELAYED_RELEASE_TABLET | Freq: Every day | ORAL | Status: DC

## 2011-08-22 ENCOUNTER — Other Ambulatory Visit: Payer: Self-pay | Admitting: Family Medicine

## 2011-08-22 NOTE — Telephone Encounter (Signed)
Patient switched PCP's will forward to Dr.Burchette for approval.   KP

## 2011-08-22 NOTE — Telephone Encounter (Signed)
Refill both for 6 months. 

## 2011-08-23 ENCOUNTER — Telehealth: Payer: Self-pay | Admitting: Family Medicine

## 2011-08-23 MED ORDER — ATORVASTATIN CALCIUM 20 MG PO TABS: 20.0000 mg | ORAL_TABLET | Freq: Every day | ORAL | Status: DC

## 2011-08-23 NOTE — Telephone Encounter (Signed)
Wrong dr Ernie Avena

## 2011-08-23 NOTE — Telephone Encounter (Signed)
Rx refill for: Atorvastatin Calcuim 20MG  Tab Qty 30 Last Refill date 07/19/11 Thanks

## 2011-08-23 NOTE — Telephone Encounter (Signed)
Letter mailed     KP 

## 2011-08-25 ENCOUNTER — Ambulatory Visit (INDEPENDENT_AMBULATORY_CARE_PROVIDER_SITE_OTHER): Payer: Medicare Other | Admitting: Internal Medicine

## 2011-08-25 ENCOUNTER — Encounter: Payer: Self-pay | Admitting: Internal Medicine

## 2011-08-25 DIAGNOSIS — Z950 Presence of cardiac pacemaker: Secondary | ICD-10-CM

## 2011-08-25 DIAGNOSIS — I4891 Unspecified atrial fibrillation: Secondary | ICD-10-CM

## 2011-08-25 DIAGNOSIS — I2510 Atherosclerotic heart disease of native coronary artery without angina pectoris: Secondary | ICD-10-CM

## 2011-08-25 LAB — PACEMAKER DEVICE OBSERVATION
ATRIAL PACING PM: 50
BRDY-0002RV: 70 {beats}/min
BRDY-0004RV: 120 {beats}/min
RV LEAD AMPLITUDE: 5.6 mv
RV LEAD THRESHOLD: 1.25 V

## 2011-08-25 NOTE — Assessment & Plan Note (Signed)
She denies anginal symptoms. She will continue with her current medical therapy. I've encouraged her to increase her physical activity.

## 2011-08-25 NOTE — Assessment & Plan Note (Signed)
She has been in and out of rhythm but has been for the most part asymptomatic. She has chronic renal insufficiency. If needed, additional antiarrhythmic drug therapy would be considered but for now I would recommend that she continue as she is with a strategy of rate control.

## 2011-08-25 NOTE — Progress Notes (Signed)
HPI [name] returns today for followup. She is a very pleasant 71 year old woman with a history of paroxysmal atrial fibrillation, symptomatic tachybradycardia syndrome, hypertension, chronic renal insufficiency, status post permanent pacemaker insertion. In the interim she has been stable it has been noted to have worsening renal insufficiency. Her amlodipine was stopped and she is no longer taking nonsteroidal anti-inflammatory drugs. She denies syncope. She denies chest pain or shortness of breath. No Known Allergies   Current Outpatient Prescriptions  Medication Sig Dispense Refill  . atorvastatin (LIPITOR) 20 MG tablet Take 1 tablet (20 mg total) by mouth daily.  30 tablet  0  . calcium carbonate (OS-CAL) 600 MG TABS Take 600 mg by mouth daily.        . Cholecalciferol (VITAMIN D) 400 UNIT/ML LIQD Take by mouth.        . colchicine 0.6 MG tablet Take 0.6 mg by mouth daily.        . fish oil-omega-3 fatty acids 1000 MG capsule Take 2 g by mouth daily.        . furosemide (LASIX) 40 MG tablet Take 2 tablets in the AM and 1 tablet in the PM  90 tablet  11  . glucose blood test strip 1 each by Other route 2 (two) times daily. Use as instructed       . LEVEMIR FLEXPEN 100 UNIT/ML injection INJECT 5 UNITS INTO THE SKIN AT BEDTIME.  15 mL  2  . metolazone (ZAROXOLYN) 2.5 MG tablet Take 1 tablet on M, W, Fri's only; take this with your lasix; you will also need to take an extra potassium on these days as well  30 tablet  11  . metoprolol succinate (TOPROL XL) 25 MG 24 hr tablet 2 po qam and 1 po qhs  90 tablet  5  . pantoprazole (PROTONIX) 40 MG tablet Take 1 tablet (40 mg total) by mouth daily.  30 tablet  11  . potassium chloride SA (K-DUR,KLOR-CON) 20 MEQ tablet Continue your potassium twice daily; however on M, W, Fr's you are to take an extra potassium when you take your metolazone  60 tablet  11  . warfarin (COUMADIN) 5 MG tablet TAKE 1 TABLET BY MOUTH DAILY AS DIRECTED  30 tablet  3  .  DISCONTD: potassium chloride SA (K-DUR,KLOR-CON) 20 MEQ tablet Take 1 tablet (20 mEq total) by mouth daily.  30 tablet  5     Past Medical History  Diagnosis Date  . Diabetes mellitus   . Hyperlipemia   . Hypertension   . CAD (coronary artery disease)     s/p PCI in [place] in 1/09  . Pacemaker   . Atrial fibrillation     prior Sotalol - d/c'd 2/2 increased creatinine; rate control strategy  . Anemia   . PUD (peptic ulcer disease) 10/2010    duodenal ulcer  . CKD (chronic kidney disease)   . Chronic combined systolic and diastolic heart failure     Echocardiogram 3/12: Mild LVH, EF Q000111Q, normal diastolic function, mild AI, mild MR, PASP 44, normal wall motion    ROS:   All systems reviewed and negative except as noted in the HPI.   Past Surgical History  Procedure Date  . Pacemaker insertion 07/13/07  . Cataract extraction     01/12/09 and 01/26/09 both eyes  . Stent implant 07/13/2007     Family History  Problem Relation Age of Onset  . Heart disease Father   . Heart attack Father   .  Clotting disorder Father     blood clot  . Hypertension Father   . Breast cancer Maternal Aunt   . Breast cancer Sister   . Other Mother     heart condition  . Heart disease Mother      History   Social History  . Marital Status: Widowed    Spouse Name: N/A    Number of Children: N/A  . Years of Education: N/A   Occupational History  . retired [employer]    Social History Main Topics  . Smoking status: Former Smoker -- 2.0 packs/day for 20 years    Types: Cigarettes    Quit date: 06/28/1991  . Smokeless tobacco: Never Used  . Alcohol Use: No  . Drug Use: No  . Sexually Active: Not on file   Other Topics Concern  . Not on file   Social History Narrative   Reg exercise     BP 128/62  Pulse 86  Wt 68.947 kg (152 lb)  SpO2 99%  Physical Exam:  Well appearing 71 year old woman, NAD HEENT: Unremarkable Neck:  No JVD, no thyromegally Lungs:  Clear with no  wheezes, rales, or rhonchi. Well-healed pacemaker incision. HEART:  Regular rate rhythm, no murmurs, no rubs, no clicks Abd:  soft, positive bowel sounds, no organomegally, no rebound, no guarding Ext:  2 plus pulses, no edema, no cyanosis, no clubbing Skin:  No rashes no nodules Neuro:  CN II through XII intact, motor grossly intact  DEVICE  Normal device function.  See PaceArt for details.   Assess/Plan:

## 2011-08-25 NOTE — Patient Instructions (Signed)
Your physician wants you to follow-up in: 6 months with device clinic and 12 months with Dr Taylor You will receive a reminder letter in the mail two months in advance. If you don't receive a letter, please call our office to schedule the follow-up appointment.  

## 2011-08-25 NOTE — Assessment & Plan Note (Signed)
Her device is working normally. We'll plan to recheck in several months. 

## 2011-09-07 ENCOUNTER — Telehealth: Payer: Self-pay | Admitting: Internal Medicine

## 2011-09-07 ENCOUNTER — Other Ambulatory Visit: Payer: Self-pay | Admitting: Family Medicine

## 2011-09-07 ENCOUNTER — Other Ambulatory Visit: Payer: Self-pay | Admitting: *Deleted

## 2011-09-07 DIAGNOSIS — I5043 Acute on chronic combined systolic (congestive) and diastolic (congestive) heart failure: Secondary | ICD-10-CM

## 2011-09-07 DIAGNOSIS — I2510 Atherosclerotic heart disease of native coronary artery without angina pectoris: Secondary | ICD-10-CM

## 2011-09-07 DIAGNOSIS — I10 Essential (primary) hypertension: Secondary | ICD-10-CM

## 2011-09-07 MED ORDER — POTASSIUM CHLORIDE CRYS ER 20 MEQ PO TBCR: EXTENDED_RELEASE_TABLET | ORAL | Status: DC

## 2011-09-07 NOTE — Telephone Encounter (Signed)
Refill F/U- please return pharmacist call  [facility]- Pharmacist Kristopher Oppenheim @ [place] [phone] Loletha Grayer will be back from lunch around 3pm)  Please return call as the RX Script that was rejected due to quantity and directions.  Patient has called pharmacist 4 times today as she is out of MEDS.  Loletha Grayer can be reached at  [phone].

## 2011-09-07 NOTE — Telephone Encounter (Signed)
I had spoken with the pharmacy earlier today and had been trying to contact the patient.  I have spoken with the patient and she states she states she take 3 Potassium on T/Th/Sat/Sun and 4 on Mon/Wed/Fri this is a 75meq tablet  I have asked that she get a BMP tomorrow at out [place] office to check her K  She has not had this checked since 06/2011.  The orders we have her taking 2 10meq daily and 3 52meq on M/W/F  I spoke with Dr Elease Hashimoto and he will have the blood work ordered for her tomorrow

## 2011-09-07 NOTE — Telephone Encounter (Signed)
New Msg: Pharmacy calling wanting to speak with nurse/MD to clarify directions of pt potassium. Please return pharmacy call to discuss further.

## 2011-09-07 NOTE — Telephone Encounter (Signed)
FU Call: Pt calling to check on status of RX refill. Pt made aware that RX was sent.

## 2011-09-07 NOTE — Telephone Encounter (Signed)
Spoke with pharmacist and sent in new rx that will last 30days  #68 tablets

## 2011-09-07 NOTE — Telephone Encounter (Signed)
Spoke with pharmicist  He is aware of plan for labs and will give patient some Potassium if needed until we get her labs back

## 2011-09-08 ENCOUNTER — Ambulatory Visit (INDEPENDENT_AMBULATORY_CARE_PROVIDER_SITE_OTHER): Payer: Medicare Other

## 2011-09-08 DIAGNOSIS — I10 Essential (primary) hypertension: Secondary | ICD-10-CM

## 2011-09-08 DIAGNOSIS — I4891 Unspecified atrial fibrillation: Secondary | ICD-10-CM

## 2011-09-08 LAB — BASIC METABOLIC PANEL
Calcium: 9.5 mg/dL (ref 8.4–10.5)
Chloride: 90 mEq/L — ABNORMAL LOW (ref 96–112)
Creatinine, Ser: 2.2 mg/dL — ABNORMAL HIGH (ref 0.4–1.2)
GFR: 23.88 mL/min — ABNORMAL LOW (ref >60.00)

## 2011-09-08 LAB — POCT INR: INR: 1.8

## 2011-09-08 NOTE — Patient Instructions (Signed)
  Latest dosing instructions   Total Dorene Grebe Tue Wed Thu Fri Sat   25 2.5 mg 5 mg 2.5 mg 5 mg 2.5 mg 5 mg 2.5 mg    (5 mg0.5) (5 mg1) (5 mg0.5) (5 mg1) (5 mg0.5) (5 mg1) (5 mg0.5)

## 2011-09-09 ENCOUNTER — Other Ambulatory Visit: Payer: Self-pay | Admitting: *Deleted

## 2011-09-09 DIAGNOSIS — I5043 Acute on chronic combined systolic (congestive) and diastolic (congestive) heart failure: Secondary | ICD-10-CM

## 2011-09-09 MED ORDER — POTASSIUM CHLORIDE CRYS ER 20 MEQ PO TBCR: EXTENDED_RELEASE_TABLET | ORAL | Status: DC

## 2011-09-12 NOTE — Telephone Encounter (Signed)
See note from [place] for labs

## 2011-09-19 ENCOUNTER — Other Ambulatory Visit: Payer: Self-pay | Admitting: *Deleted

## 2011-09-19 MED ORDER — ATORVASTATIN CALCIUM 20 MG PO TABS: 20.0000 mg | ORAL_TABLET | Freq: Every day | ORAL | Status: DC

## 2011-09-20 ENCOUNTER — Ambulatory Visit (INDEPENDENT_AMBULATORY_CARE_PROVIDER_SITE_OTHER): Payer: Medicare Other

## 2011-09-20 DIAGNOSIS — I4891 Unspecified atrial fibrillation: Secondary | ICD-10-CM

## 2011-09-20 DIAGNOSIS — Z7901 Long term (current) use of anticoagulants: Secondary | ICD-10-CM

## 2011-09-20 LAB — POCT INR: INR: 2.1

## 2011-09-20 NOTE — Patient Instructions (Signed)
  Latest dosing instructions   Total Dorene Grebe Tue Wed Thu Fri Sat   25 2.5 mg 5 mg 2.5 mg 5 mg 2.5 mg 5 mg 2.5 mg    (5 mg0.5) (5 mg1) (5 mg0.5) (5 mg1) (5 mg0.5) (5 mg1) (5 mg0.5)

## 2011-09-22 ENCOUNTER — Ambulatory Visit: Payer: Medicare Other

## 2011-10-10 ENCOUNTER — Ambulatory Visit: Payer: Medicare Other | Admitting: Family Medicine

## 2011-10-12 ENCOUNTER — Encounter: Payer: Self-pay | Admitting: Family Medicine

## 2011-10-12 ENCOUNTER — Ambulatory Visit (INDEPENDENT_AMBULATORY_CARE_PROVIDER_SITE_OTHER): Payer: Medicare Other | Admitting: Family Medicine

## 2011-10-12 VITALS — BP 120/70 | Temp 97.4°F | Wt 155.0 lb

## 2011-10-12 DIAGNOSIS — I4891 Unspecified atrial fibrillation: Secondary | ICD-10-CM

## 2011-10-12 DIAGNOSIS — I10 Essential (primary) hypertension: Secondary | ICD-10-CM

## 2011-10-12 DIAGNOSIS — I5043 Acute on chronic combined systolic (congestive) and diastolic (congestive) heart failure: Secondary | ICD-10-CM

## 2011-10-12 DIAGNOSIS — E119 Type 2 diabetes mellitus without complications: Secondary | ICD-10-CM

## 2011-10-12 LAB — POCT INR: INR: 1.8

## 2011-10-12 NOTE — Patient Instructions (Addendum)
Diabetes and Foot Care Diabetes may cause you to have a poor blood supply (circulation) to your legs and feet. Because of this, the skin may be thinner, break easier, and heal more slowly. You also may have nerve damage in your legs and feet causing decreased feeling. You may not notice minor injuries to your feet that could lead to serious problems or infections. Taking care of your feet is one of the most important things you can do for yourself.  HOME CARE INSTRUCTIONS  Do not go barefoot. Bare feet are easily injured.   Check your feet daily for blisters, cuts, and redness.   Wash your feet with warm water (not hot) and mild soap. Pat your feet and between your toes until completely dry.   Apply a moisturizing lotion that does not contain alcohol or petroleum jelly to the dry skin on your feet and to dry brittle toenails. Do not put it between your toes.   Trim your toenails straight across. Do not dig under them or around the cuticle.   Do not cut corns or calluses, or try to remove them with medicine.   Wear clean cotton socks or stockings every day. Make sure they are not too tight. Do not wear knee high stockings since they may decrease blood flow to your legs.   Wear leather shoes that fit properly and have enough cushioning. To break in new shoes, wear them just a few hours a day to avoid injuring your feet.   Wear shoes at all times, even in the house.   Do not cross your legs. This may decrease the blood flow to your feet.   If you find a minor scrape, cut, or break in the skin on your feet, keep it and the skin around it clean and dry. These areas may be cleansed with mild soap and water. Do not use peroxide, alcohol, iodine or Merthiolate.   When you remove an adhesive bandage, be sure not to harm the skin around it.   If you have a wound, look at it several times a day to make sure it is healing.   Do not use heating pads or hot water bottles. Burns can occur. If you have  lost feeling in your feet or legs, you may not know it is happening until it is too late.   Report any cuts, sores or bruises to your caregiver. Do not wait!  SEEK MEDICAL CARE IF:   You have an injury that is not healing or you notice redness, numbness, burning, or tingling.   Your feet always feel cold.   You have pain or cramps in your legs and feet.  SEEK IMMEDIATE MEDICAL CARE IF:   There is increasing redness, swelling, or increasing pain in the wound.   There is a red line that goes up your leg.   Pus is coming from a wound.   You develop an unexplained oral temperature above 102 F (38.9 C), or as your caregiver suggests.   You notice a bad smell coming from an ulcer or wound.  MAKE SURE YOU:   Understand these instructions.   Will watch your condition.   Will get help right away if you are not doing well or get worse.  Document Released: 06/10/2000 Document Revised: 06/02/2011 Document Reviewed: 12/17/2008 [facility] Patient Information 2012 [place].     Latest dosing instructions   Total Dorene Grebe Tue Wed Thu Fri Sat   27.5 5 mg 2.5 mg 5  mg 2.5 mg 5 mg 2.5 mg 5 mg    (5 mg1) (5 mg0.5) (5 mg1) (5 mg0.5) (5 mg1) (5 mg0.5) (5 mg1)

## 2011-10-12 NOTE — Progress Notes (Signed)
  Subjective:    Patient ID: Yolanda Huffman, female    DOB: Apr 05, 1941, 71 y.o.   MRN: [id number]  HPI  Medical followup. Patient has history type 2 diabetes, CAD, systolic and diastolic heart failure, hypertension, atrial fibrillation, chronic kidney disease, and osteoporosis. Her edema is stable. She takes drug regimen of 2 diuretics. She does daily body weights which are stable and she has significantly less edema than last visit here a few months ago.  Diabetes stable. A1c 6.5% in January. Not monitoring blood sugars regularly. No hypoglycemia. No symptoms of hyperglycemia.  History of atrial fibrillation. Chronic Coumadin. No bleeding complications. Requesting INR today. Chronic kidney disease creatinine 2.2 recently. She has regular followup with nephrologist.  Past Medical History  Diagnosis Date  . Diabetes mellitus   . Hyperlipemia   . Hypertension   . CAD (coronary artery disease)     s/p PCI in [place] in 1/09  . Pacemaker   . Atrial fibrillation     prior Sotalol - d/c'd 2/2 increased creatinine; rate control strategy  . Anemia   . PUD (peptic ulcer disease) 10/2010    duodenal ulcer  . CKD (chronic kidney disease)   . Chronic combined systolic and diastolic heart failure     Echocardiogram 3/12: Mild LVH, EF Q000111Q, normal diastolic function, mild AI, mild MR, PASP 44, normal wall motion   Past Surgical History  Procedure Date  . Pacemaker insertion 07/13/07  . Cataract extraction     01/12/09 and 01/26/09 both eyes  . Stent implant 07/13/2007    reports that she quit smoking about 20 years ago. Her smoking use included Cigarettes. She has a 40 pack-year smoking history. She has never used smokeless tobacco. She reports that she does not drink alcohol or use illicit drugs. family history includes Breast cancer in her maternal aunt and sister; Clotting disorder in her father; Heart attack in her father; Heart disease in her father and mother; Hypertension in her father; and Other  in her mother. No Known Allergies    Review of Systems  Constitutional: Negative for fatigue.  Eyes: Negative for visual disturbance.  Respiratory: Negative for cough, chest tightness, shortness of breath and wheezing.   Cardiovascular: Negative for chest pain, palpitations and leg swelling.  Gastrointestinal: Negative for abdominal pain.  Genitourinary: Negative for dysuria.  Neurological: Negative for dizziness, seizures, syncope, weakness, light-headedness and headaches.       Objective:   Physical Exam  Constitutional: She appears well-developed and well-nourished.  HENT:  Mouth/Throat: Oropharynx is clear and moist.  Neck: Neck supple. No thyromegaly present.  Cardiovascular: Normal rate and regular rhythm.   Pulmonary/Chest: Effort normal and breath sounds normal. No respiratory distress. She has no wheezes. She has no rales.  Musculoskeletal: She exhibits no edema.  Lymphadenopathy:    She has no cervical adenopathy.  Psychiatric: She has a normal mood and affect. Her behavior is normal.          Assessment & Plan:  #1 type 2 diabetes. Recent good control. Recheck A1c.  #2 atrial fibrillation on chronic Coumadin. Recheck INR  #3 chronic kidney disease followed by nephrology  #4 history of chronic peripheral edema stable. Continue daily weights.  low sodium diet.

## 2011-10-18 NOTE — Progress Notes (Signed)
Quick Note:  Pt informed, copy mailed to home with instructions highlighted ______

## 2011-10-21 ENCOUNTER — Ambulatory Visit: Payer: Medicare Other

## 2011-10-31 ENCOUNTER — Other Ambulatory Visit (HOSPITAL_COMMUNITY): Payer: Self-pay | Admitting: *Deleted

## 2011-11-01 ENCOUNTER — Encounter (HOSPITAL_COMMUNITY)
Admission: RE | Admit: 2011-11-01 | Discharge: 2011-11-01 | Disposition: A | Discharge status: Home / Self Care | Payer: Medicare Other | Source: Ambulatory Visit | Attending: Nephrology | Admitting: Nephrology

## 2011-11-01 ENCOUNTER — Encounter (HOSPITAL_COMMUNITY): Payer: Medicare Other

## 2011-11-01 DIAGNOSIS — D509 Iron deficiency anemia, unspecified: Secondary | ICD-10-CM | POA: Insufficient documentation

## 2011-11-01 MED ORDER — SODIUM CHLORIDE 0.9 % IV SOLN
Freq: Once | INTRAVENOUS | Status: AC
  Administered 2011-11-01: 250 mL via INTRAVENOUS

## 2011-11-01 MED ORDER — FERUMOXYTOL INJECTION 510 MG/17 ML
510.0000 mg | Freq: Once | INTRAVENOUS | Status: AC
  Administered 2011-11-01: 510 mg via INTRAVENOUS
  Filled 2011-11-01: qty 17

## 2011-11-04 ENCOUNTER — Encounter (HOSPITAL_COMMUNITY): Payer: Medicare Other

## 2011-11-08 ENCOUNTER — Other Ambulatory Visit: Payer: Self-pay | Admitting: *Deleted

## 2011-11-08 MED ORDER — METOPROLOL SUCCINATE ER 25 MG PO TB24: ORAL_TABLET | ORAL | Status: DC

## 2011-11-11 ENCOUNTER — Ambulatory Visit: Payer: Medicare Other

## 2011-11-11 DIAGNOSIS — I4891 Unspecified atrial fibrillation: Secondary | ICD-10-CM

## 2011-11-11 LAB — POCT INR: INR: 2.5

## 2011-11-11 NOTE — Patient Instructions (Signed)
  Latest dosing instructions   Total Sun Mon Tue Wed Thu Fri Sat   27.5 5 mg 2.5 mg 5 mg 2.5 mg 5 mg 2.5 mg 5 mg    (5 mg1) (5 mg0.5) (5 mg1) (5 mg0.5) (5 mg1) (5 mg0.5) (5 mg1)

## 2011-11-15 ENCOUNTER — Other Ambulatory Visit: Payer: Self-pay | Admitting: Family Medicine

## 2011-12-13 ENCOUNTER — Encounter: Payer: Medicare Other | Admitting: Family

## 2011-12-16 ENCOUNTER — Other Ambulatory Visit: Payer: Self-pay | Admitting: Family Medicine

## 2011-12-18 NOTE — Telephone Encounter (Signed)
Refill coumadin for 3 months and  Lipitor for one year.

## 2011-12-20 ENCOUNTER — Ambulatory Visit (INDEPENDENT_AMBULATORY_CARE_PROVIDER_SITE_OTHER): Payer: Medicare Other | Admitting: Family

## 2011-12-20 DIAGNOSIS — I4891 Unspecified atrial fibrillation: Secondary | ICD-10-CM

## 2011-12-20 NOTE — Patient Instructions (Addendum)
Hold today's dose. Then continue same dose, 2.5 mg on mondays,wednesdays and fridays 5 mg on other days, check in 4 weeks    Latest dosing instructions   Total Sun Mon Tue Wed Thu Fri Sat   27.5 5 mg 2.5 mg 5 mg 2.5 mg 5 mg 2.5 mg 5 mg    (5 mg1) (5 mg0.5) (5 mg1) (5 mg0.5) (5 mg1) (5 mg0.5) (5 mg1)

## 2011-12-21 ENCOUNTER — Other Ambulatory Visit: Payer: Self-pay | Admitting: Family Medicine

## 2011-12-22 ENCOUNTER — Other Ambulatory Visit: Payer: Self-pay | Admitting: *Deleted

## 2011-12-22 MED ORDER — INSULIN DETEMIR 100 UNIT/ML ~~LOC~~ SOLN: 5.0000 [IU] | Freq: Every day | SUBCUTANEOUS | Status: DC

## 2011-12-27 ENCOUNTER — Telehealth: Payer: Self-pay | Admitting: Family Medicine

## 2011-12-27 NOTE — Telephone Encounter (Signed)
Pt is schd to come in on 01/10/12 for Coumadin. Pt is suppose to be getting some labs drawn by Dr Marval Regal on the wk of July 22nd. Pt is wondering if she can bring lab order to [facility] and get the lab drawn here instead? Then pt would want the lab results sent to Dr Linna Caprice office.

## 2011-12-27 NOTE — Telephone Encounter (Signed)
Pt informed and voiced her understanding

## 2011-12-27 NOTE — Telephone Encounter (Signed)
Should be able to get here-as long as specific orders with clear dx codes for those labs.

## 2012-01-10 ENCOUNTER — Ambulatory Visit (INDEPENDENT_AMBULATORY_CARE_PROVIDER_SITE_OTHER): Payer: Medicare Other | Admitting: Family

## 2012-01-10 DIAGNOSIS — N2581 Secondary hyperparathyroidism of renal origin: Secondary | ICD-10-CM

## 2012-01-10 DIAGNOSIS — D509 Iron deficiency anemia, unspecified: Secondary | ICD-10-CM

## 2012-01-10 DIAGNOSIS — R5383 Other fatigue: Secondary | ICD-10-CM

## 2012-01-10 DIAGNOSIS — I4891 Unspecified atrial fibrillation: Secondary | ICD-10-CM

## 2012-01-10 DIAGNOSIS — R5381 Other malaise: Secondary | ICD-10-CM

## 2012-01-10 LAB — COMPREHENSIVE METABOLIC PANEL
Alkaline Phosphatase: 51 U/L (ref 39–117)
BUN: 57 mg/dL — ABNORMAL HIGH (ref 6–23)
CO2: 25 mEq/L (ref 19–32)
GFR: 22.99 mL/min — ABNORMAL LOW (ref >60.00)
Glucose, Bld: 155 mg/dL — ABNORMAL HIGH (ref 70–99)
Sodium: 136 mEq/L (ref 135–145)
Total Bilirubin: 0.6 mg/dL (ref 0.3–1.2)
Total Protein: 8.4 g/dL — ABNORMAL HIGH (ref 6.0–8.3)

## 2012-01-10 LAB — CBC WITH DIFFERENTIAL/PLATELET
Basophils Absolute: 0 10*3/uL (ref 0.0–0.1)
Eosinophils Relative: 1.8 % (ref 0.0–5.0)
HCT: 37.5 % (ref 36.0–46.0)
Hemoglobin: 12.7 g/dL (ref 12.0–15.0)
Lymphocytes Relative: 18 % (ref 12.0–46.0)
Monocytes Relative: 6.1 % (ref 3.0–12.0)
Neutro Abs: 5 10*3/uL (ref 1.4–7.7)
Platelets: 192 10*3/uL (ref 150.0–400.0)
RDW: 13.6 % (ref 11.5–14.6)
WBC: 6.8 10*3/uL (ref 4.5–10.5)

## 2012-01-10 LAB — IBC PANEL
Saturation Ratios: 37.9 % (ref 20.0–50.0)
Transferrin: 211.2 mg/dL — ABNORMAL LOW (ref 212.0–360.0)

## 2012-01-10 LAB — PHOSPHORUS: Phosphorus: 4 mg/dL (ref 2.3–4.6)

## 2012-01-10 LAB — TSH: TSH: 2.23 u[IU]/mL (ref 0.35–5.50)

## 2012-01-10 NOTE — Patient Instructions (Addendum)
Continue same dose, 2.5 mg on mondays,wednesdays and fridays 5 mg on other days, check in 4 weeks    Latest dosing instructions   Total Sun Mon Tue Wed Thu Fri Sat   27.5 5 mg 2.5 mg 5 mg 2.5 mg 5 mg 2.5 mg 5 mg    (5 mg1) (5 mg0.5) (5 mg1) (5 mg0.5) (5 mg1) (5 mg0.5) (5 mg1)

## 2012-01-11 LAB — FERRITIN: Ferritin: 242.8 ng/mL (ref 10.0–291.0)

## 2012-01-12 NOTE — Progress Notes (Signed)
Quick Note:  Labs faxed electronically to [facility], attn Jan at [phone] ______

## 2012-02-07 ENCOUNTER — Encounter: Payer: Medicare Other | Admitting: Family

## 2012-02-07 ENCOUNTER — Other Ambulatory Visit: Payer: Self-pay | Admitting: Family Medicine

## 2012-02-07 DIAGNOSIS — Z1231 Encounter for screening mammogram for malignant neoplasm of breast: Secondary | ICD-10-CM

## 2012-02-08 ENCOUNTER — Encounter: Payer: Medicare Other | Admitting: Family

## 2012-02-09 ENCOUNTER — Encounter: Payer: Medicare Other | Admitting: Family

## 2012-02-10 ENCOUNTER — Ambulatory Visit (INDEPENDENT_AMBULATORY_CARE_PROVIDER_SITE_OTHER): Payer: Medicare Other | Admitting: Family

## 2012-02-10 DIAGNOSIS — I4891 Unspecified atrial fibrillation: Secondary | ICD-10-CM

## 2012-02-10 LAB — POCT INR: INR: 2.8

## 2012-02-10 NOTE — Patient Instructions (Signed)
Continue same dose, 2.5 mg on mondays,wednesdays and fridays 5 mg on other days, check in 4 weeks    Latest dosing instructions   Total Sun Mon Tue Wed Thu Fri Sat   27.5 5 mg 2.5 mg 5 mg 2.5 mg 5 mg 2.5 mg 5 mg    (5 mg1) (5 mg0.5) (5 mg1) (5 mg0.5) (5 mg1) (5 mg0.5) (5 mg1)

## 2012-02-23 ENCOUNTER — Ambulatory Visit (INDEPENDENT_AMBULATORY_CARE_PROVIDER_SITE_OTHER): Payer: Medicare Other | Admitting: *Deleted

## 2012-02-23 DIAGNOSIS — I4891 Unspecified atrial fibrillation: Secondary | ICD-10-CM

## 2012-02-23 DIAGNOSIS — I498 Other specified cardiac arrhythmias: Secondary | ICD-10-CM

## 2012-02-23 LAB — PACEMAKER DEVICE OBSERVATION
AL IMPEDENCE PM: 390 Ohm
BATTERY VOLTAGE: 2.78 V
BRDY-0002RV: 70 {beats}/min
BRDY-0004RV: 120 {beats}/min
RV LEAD AMPLITUDE: 5.6 mv
VENTRICULAR PACING PM: 98

## 2012-02-23 NOTE — Progress Notes (Signed)
Pacer check in clinic  

## 2012-03-08 ENCOUNTER — Ambulatory Visit (INDEPENDENT_AMBULATORY_CARE_PROVIDER_SITE_OTHER): Payer: Medicare Other | Admitting: Family

## 2012-03-08 DIAGNOSIS — Z7901 Long term (current) use of anticoagulants: Secondary | ICD-10-CM

## 2012-03-08 DIAGNOSIS — I4891 Unspecified atrial fibrillation: Secondary | ICD-10-CM

## 2012-03-08 NOTE — Patient Instructions (Signed)
Hold Coumadin today only. Eat more greens. Continue same dose, 2.5 mg on mondays,wednesdays and fridays 5 mg on other days, check in 2 weeks.    Latest dosing instructions   Total Sun Mon Tue Wed Thu Fri Sat   27.5 5 mg 2.5 mg 5 mg 2.5 mg 5 mg 2.5 mg 5 mg    (5 mg1) (5 mg0.5) (5 mg1) (5 mg0.5) (5 mg1) (5 mg0.5) (5 mg1)

## 2012-03-16 ENCOUNTER — Ambulatory Visit: Payer: Medicare Other

## 2012-03-22 ENCOUNTER — Encounter: Payer: Medicare Other | Admitting: Family

## 2012-03-28 ENCOUNTER — Ambulatory Visit (INDEPENDENT_AMBULATORY_CARE_PROVIDER_SITE_OTHER): Payer: Medicare Other | Admitting: Family

## 2012-03-28 DIAGNOSIS — I4891 Unspecified atrial fibrillation: Secondary | ICD-10-CM

## 2012-03-28 NOTE — Patient Instructions (Addendum)
Hold Coumadin today only. Eat more greens. Take 1 tab (5mg ) Tuesdays and Thursdays. All other days take 1/2 tab (2.5mg ).     Latest dosing instructions   Total Sun Mon Tue Wed Thu Fri Sat   22.5 2.5 mg 2.5 mg 5 mg 2.5 mg 5 mg 2.5 mg 2.5 mg    (5 mg0.5) (5 mg0.5) (5 mg1) (5 mg0.5) (5 mg1) (5 mg0.5) (5 mg0.5)

## 2012-03-29 ENCOUNTER — Encounter: Payer: Self-pay | Admitting: Family Medicine

## 2012-03-29 ENCOUNTER — Ambulatory Visit (INDEPENDENT_AMBULATORY_CARE_PROVIDER_SITE_OTHER): Payer: Medicare Other | Admitting: Family Medicine

## 2012-03-29 VITALS — BP 130/60 | Temp 97.7°F | Wt 158.0 lb

## 2012-03-29 DIAGNOSIS — E785 Hyperlipidemia, unspecified: Secondary | ICD-10-CM

## 2012-03-29 DIAGNOSIS — I4891 Unspecified atrial fibrillation: Secondary | ICD-10-CM

## 2012-03-29 DIAGNOSIS — I10 Essential (primary) hypertension: Secondary | ICD-10-CM

## 2012-03-29 DIAGNOSIS — Z23 Encounter for immunization: Secondary | ICD-10-CM

## 2012-03-29 DIAGNOSIS — E119 Type 2 diabetes mellitus without complications: Secondary | ICD-10-CM

## 2012-03-29 LAB — LIPID PANEL
Cholesterol: 163 mg/dL (ref 0–200)
HDL: 45.9 mg/dL (ref >39.00)
LDL Cholesterol: 83 mg/dL (ref 0–99)
Total CHOL/HDL Ratio: 4
Triglycerides: 169 mg/dL — ABNORMAL HIGH (ref 0.0–149.0)
VLDL: 33.8 mg/dL (ref 0.0–40.0)

## 2012-03-29 LAB — HEMOGLOBIN A1C: Hgb A1c MFr Bld: 7.8 % — ABNORMAL HIGH (ref 4.6–6.5)

## 2012-03-29 LAB — HEPATIC FUNCTION PANEL
Albumin: 3.5 g/dL (ref 3.5–5.2)
Alkaline Phosphatase: 53 U/L (ref 39–117)
Total Protein: 8 g/dL (ref 6.0–8.3)

## 2012-03-29 NOTE — Progress Notes (Signed)
Subjective:    Patient ID: Yolanda Huffman, female    DOB: 18-Jun-1941, 71 y.o.   MRN: [id number]  HPI  Medical followup. Patient has history of type 2 diabetes, CAD, history of chronic systolic and diastolic heart failure, hypertension, atrial fibrillation, hyperlipidemia, chronic kidney disease which is followed by nephrology, osteoporosis. INR yesterday was slightly elevated. Adjustment made in Coumadin. Her weight has been relatively stable. Up 3 pounds from last visit. Blood sugars fairly well controlled by fastings with recent A1c 8.0%. She currently takes Levemir 10 units once daily. No hypoglycemia. Getting regular eye exams. No history of neuropathy symptoms  Atrial fibrillation on Coumadin. No recent rate control problems. No bleeding complications. Still needs flu vaccine. Hyperlipidemia treated with Lipitor. No myalgias. Due for lipids at this time. No recent chest pains. She's had previous Pneumovax.  Past Medical History  Diagnosis Date  . Diabetes mellitus   . Hyperlipemia   . Hypertension   . CAD (coronary artery disease)     s/p PCI in [place] in 1/09  . Pacemaker   . Atrial fibrillation     prior Sotalol - d/c'd 2/2 increased creatinine; rate control strategy  . Anemia   . PUD (peptic ulcer disease) 10/2010    duodenal ulcer  . CKD (chronic kidney disease)   . Chronic combined systolic and diastolic heart failure     Echocardiogram 3/12: Mild LVH, EF Q000111Q, normal diastolic function, mild AI, mild MR, PASP 44, normal wall motion   Past Surgical History  Procedure Date  . Pacemaker insertion 07/13/07  . Cataract extraction     01/12/09 and 01/26/09 both eyes  . Stent implant 07/13/2007    reports that she quit smoking about 20 years ago. Her smoking use included Cigarettes. She has a 40 pack-year smoking history. She has never used smokeless tobacco. She reports that she does not drink alcohol or use illicit drugs. family history includes Breast cancer in her maternal aunt  and sister; Clotting disorder in her father; Heart attack in her father; Heart disease in her father and mother; Hypertension in her father; and Other in her mother. No Known Allergies    Review of Systems  Constitutional: Negative for appetite change, fatigue and unexpected weight change.  Eyes: Negative for visual disturbance.  Respiratory: Negative for cough, chest tightness, shortness of breath and wheezing.   Cardiovascular: Negative for chest pain, palpitations and leg swelling.  Gastrointestinal: Negative for nausea, vomiting, diarrhea and constipation.  Genitourinary: Negative for dysuria.  Skin: Negative for rash.  Neurological: Negative for dizziness, seizures, syncope, weakness, light-headedness and headaches.       Objective:   Physical Exam  Constitutional: She is oriented to person, place, and time. She appears well-developed and well-nourished.  HENT:  Mouth/Throat: Oropharynx is clear and moist.  Neck: Neck supple.  Cardiovascular: Normal rate.   Pulmonary/Chest: Effort normal and breath sounds normal. No respiratory distress. She has no wheezes. She has no rales.  Musculoskeletal: She exhibits no edema.       Feet reveal no skin lesions. Good distal foot pulses. Good capillary refill. No calluses. Normal sensation with monofilament testing   Lymphadenopathy:    She has no cervical adenopathy.  Neurological: She is alert and oriented to person, place, and time.          Assessment & Plan:  #1 type 2 diabetes. Recheck A1c. Her fastings appear to be well controlled. If A1c still elevated consider short-acting insulin at meals with sliding scale as  she's already checking blood sugars 4 times daily #2 atrial fibrillation. Over anticoagulated. Adjustment made yesterday. Continue close followup  #3 history of hypertension well controlled. Continue current medications  #4 hyperlipidemia. Recheck lipid and hepatic panel  #5 health maintenance. Flu vaccine given.   Pneumovax last year.

## 2012-03-30 NOTE — Progress Notes (Signed)
Quick Note:  Pt informed ______ 

## 2012-04-03 ENCOUNTER — Ambulatory Visit: Payer: Medicare Other | Admitting: Family Medicine

## 2012-04-12 ENCOUNTER — Ambulatory Visit: Payer: Medicare Other | Admitting: Family Medicine

## 2012-04-14 ENCOUNTER — Other Ambulatory Visit: Payer: Self-pay | Admitting: Family Medicine

## 2012-04-20 ENCOUNTER — Encounter: Payer: Self-pay | Admitting: Internal Medicine

## 2012-04-24 ENCOUNTER — Encounter: Payer: Medicare Other | Admitting: Family

## 2012-04-26 ENCOUNTER — Ambulatory Visit (INDEPENDENT_AMBULATORY_CARE_PROVIDER_SITE_OTHER): Payer: Medicare Other | Admitting: Family

## 2012-04-26 DIAGNOSIS — I4891 Unspecified atrial fibrillation: Secondary | ICD-10-CM

## 2012-04-26 NOTE — Patient Instructions (Addendum)
Take 1 tab (5mg ) Tuesdays and Thursdays. All other days take 1/2 tab (2.5mg ).     Latest dosing instructions   Total Sun Mon Tue Wed Thu Fri Sat   22.5 2.5 mg 2.5 mg 5 mg 2.5 mg 5 mg 2.5 mg 2.5 mg    (5 mg0.5) (5 mg0.5) (5 mg1) (5 mg0.5) (5 mg1) (5 mg0.5) (5 mg0.5)

## 2012-05-22 ENCOUNTER — Ambulatory Visit (INDEPENDENT_AMBULATORY_CARE_PROVIDER_SITE_OTHER): Payer: Medicare Other | Admitting: Family

## 2012-05-22 DIAGNOSIS — I4891 Unspecified atrial fibrillation: Secondary | ICD-10-CM

## 2012-05-23 ENCOUNTER — Ambulatory Visit
Admission: RE | Admit: 2012-05-23 | Discharge: 2012-05-23 | Disposition: A | Discharge status: Home / Self Care | Payer: Medicare Other | Source: Ambulatory Visit | Attending: Family Medicine | Admitting: Family Medicine

## 2012-05-23 DIAGNOSIS — Z1231 Encounter for screening mammogram for malignant neoplasm of breast: Secondary | ICD-10-CM

## 2012-06-25 ENCOUNTER — Ambulatory Visit (INDEPENDENT_AMBULATORY_CARE_PROVIDER_SITE_OTHER): Payer: Medicare Other | Admitting: Family

## 2012-06-25 DIAGNOSIS — I4891 Unspecified atrial fibrillation: Secondary | ICD-10-CM

## 2012-06-25 NOTE — Patient Instructions (Addendum)
Take 1 tab (5mg ) Tuesdays and Thursdays. All other days take 1/2 tab (2.5mg ). Recheck in 6 week.     Latest dosing instructions   Total Sun Mon Tue Wed Thu Fri Sat   22.5 2.5 mg 2.5 mg 5 mg 2.5 mg 5 mg 2.5 mg 2.5 mg    (5 mg0.5) (5 mg0.5) (5 mg1) (5 mg0.5) (5 mg1) (5 mg0.5) (5 mg0.5)

## 2012-06-29 ENCOUNTER — Ambulatory Visit (INDEPENDENT_AMBULATORY_CARE_PROVIDER_SITE_OTHER): Payer: Medicare Other | Admitting: Family Medicine

## 2012-06-29 ENCOUNTER — Encounter: Payer: Self-pay | Admitting: Family Medicine

## 2012-06-29 VITALS — BP 132/68 | Temp 97.6°F | Wt 165.0 lb

## 2012-06-29 DIAGNOSIS — I10 Essential (primary) hypertension: Secondary | ICD-10-CM

## 2012-06-29 DIAGNOSIS — I4891 Unspecified atrial fibrillation: Secondary | ICD-10-CM

## 2012-06-29 DIAGNOSIS — E119 Type 2 diabetes mellitus without complications: Secondary | ICD-10-CM

## 2012-06-29 NOTE — Progress Notes (Signed)
Subjective:    Patient ID: Yolanda Huffman, female    DOB: [birth year], 72 y.o.   MRN: [id number]  HPI Medical followup. Patient history type 2 diabetes, coronary artery disease, hypertension, atrial fibrillation, chronic kidney disease, osteoporosis, and cardiac pacemaker. She remains on Coumadin for atrial fibrillation. She has some questions today about some of the newer oral medications which do not require regular monitoring. She's not sure about her insurance coverage for these types of medications (Xarelto, Eloquis). She's not any recent bleeding complications.  Type 2 diabetes. Fasting blood sugars consistently run 100 or less. Takes Levemir 10 units daily. Not checking postprandial blood sugars. No symptoms of hypo-or hyperglycemia. She had recent eye exam.  History of chronic kidney disease followed by nephrology. Blood pressures been stable. No orthostasis.  Requesting handicap sticker. She ambulates with cane. No recent falls.  Past Medical History  Diagnosis Date  . Diabetes mellitus   . Hyperlipemia   . Hypertension   . CAD (coronary artery disease)     s/p PCI in [place] in 1/09  . Pacemaker   . Atrial fibrillation     prior Sotalol - d/c'd 2/2 increased creatinine; rate control strategy  . Anemia   . PUD (peptic ulcer disease) 10/2010    duodenal ulcer  . CKD (chronic kidney disease)   . Chronic combined systolic and diastolic heart failure     Echocardiogram 3/12: Mild LVH, EF Q000111Q, normal diastolic function, mild AI, mild MR, PASP 44, normal wall motion   Past Surgical History  Procedure Date  . Pacemaker insertion 07/13/07  . Cataract extraction     01/12/09 and 01/26/09 both eyes  . Stent implant 07/13/2007    reports that she quit smoking about 21 years ago. Her smoking use included Cigarettes. She has a 40 pack-year smoking history. She has never used smokeless tobacco. She reports that she does not drink alcohol or use illicit drugs. family history includes Breast  cancer in her maternal aunt and sister; Clotting disorder in her father; Heart attack in her father; Heart disease in her father and mother; Hypertension in her father; and Other in her mother. No Known Allergies    Review of Systems  Constitutional: Negative for appetite change, fatigue and unexpected weight change.  Eyes: Negative for visual disturbance.  Respiratory: Negative for cough, chest tightness, shortness of breath and wheezing.   Cardiovascular: Negative for chest pain, palpitations and leg swelling.  Neurological: Negative for dizziness, seizures, syncope, weakness, light-headedness and headaches.       Objective:   Physical Exam  Constitutional: She appears well-developed and well-nourished.  HENT:  Mouth/Throat: Oropharynx is clear and moist.  Neck: Neck supple. No thyromegaly present.  Cardiovascular: Normal rate.   Pulmonary/Chest: Effort normal and breath sounds normal. No respiratory distress. She has no wheezes. She has no rales.  Musculoskeletal: She exhibits no edema.       Morton's toe right foot. No calluses. No lesions. Good distal foot pulses. Normal sensory function. She has significant varicosities diffusely  Lymphadenopathy:    She has no cervical adenopathy.          Assessment & Plan:  #1 type 2 diabetes. History of marginal control. Recheck A1c. Discussed importance of exercise and weight loss. She is currently very sedentary #2 atrial fibrillation. Appears to be in NSR currently. We discussed possible oral options such as Xarelto and she will check on insurance coverage #3 history of chronic kidney disease followed by nephrology #4 hypertension stable.  Continue current medications

## 2012-07-02 ENCOUNTER — Other Ambulatory Visit: Payer: Self-pay | Admitting: Internal Medicine

## 2012-07-03 NOTE — Progress Notes (Signed)
Quick Note:  Pt informed on home VM ______ 

## 2012-07-04 ENCOUNTER — Other Ambulatory Visit: Payer: Self-pay | Admitting: *Deleted

## 2012-07-04 MED ORDER — POTASSIUM CHLORIDE CRYS ER 20 MEQ PO TBCR: 20.0000 meq | EXTENDED_RELEASE_TABLET | Freq: Three times a day (TID) | ORAL | Status: DC

## 2012-07-04 NOTE — Telephone Encounter (Signed)
Refilled Potassium

## 2012-07-09 ENCOUNTER — Other Ambulatory Visit: Payer: Self-pay | Admitting: Physician Assistant

## 2012-07-12 IMAGING — CR DG CHEST 2V
2 series · 2 of 2 positions shown · non-contrast
Comparison: 09/07/2010

CLINICAL DATA: Short of breath

CHEST - 2 VIEW

[w chest pa]
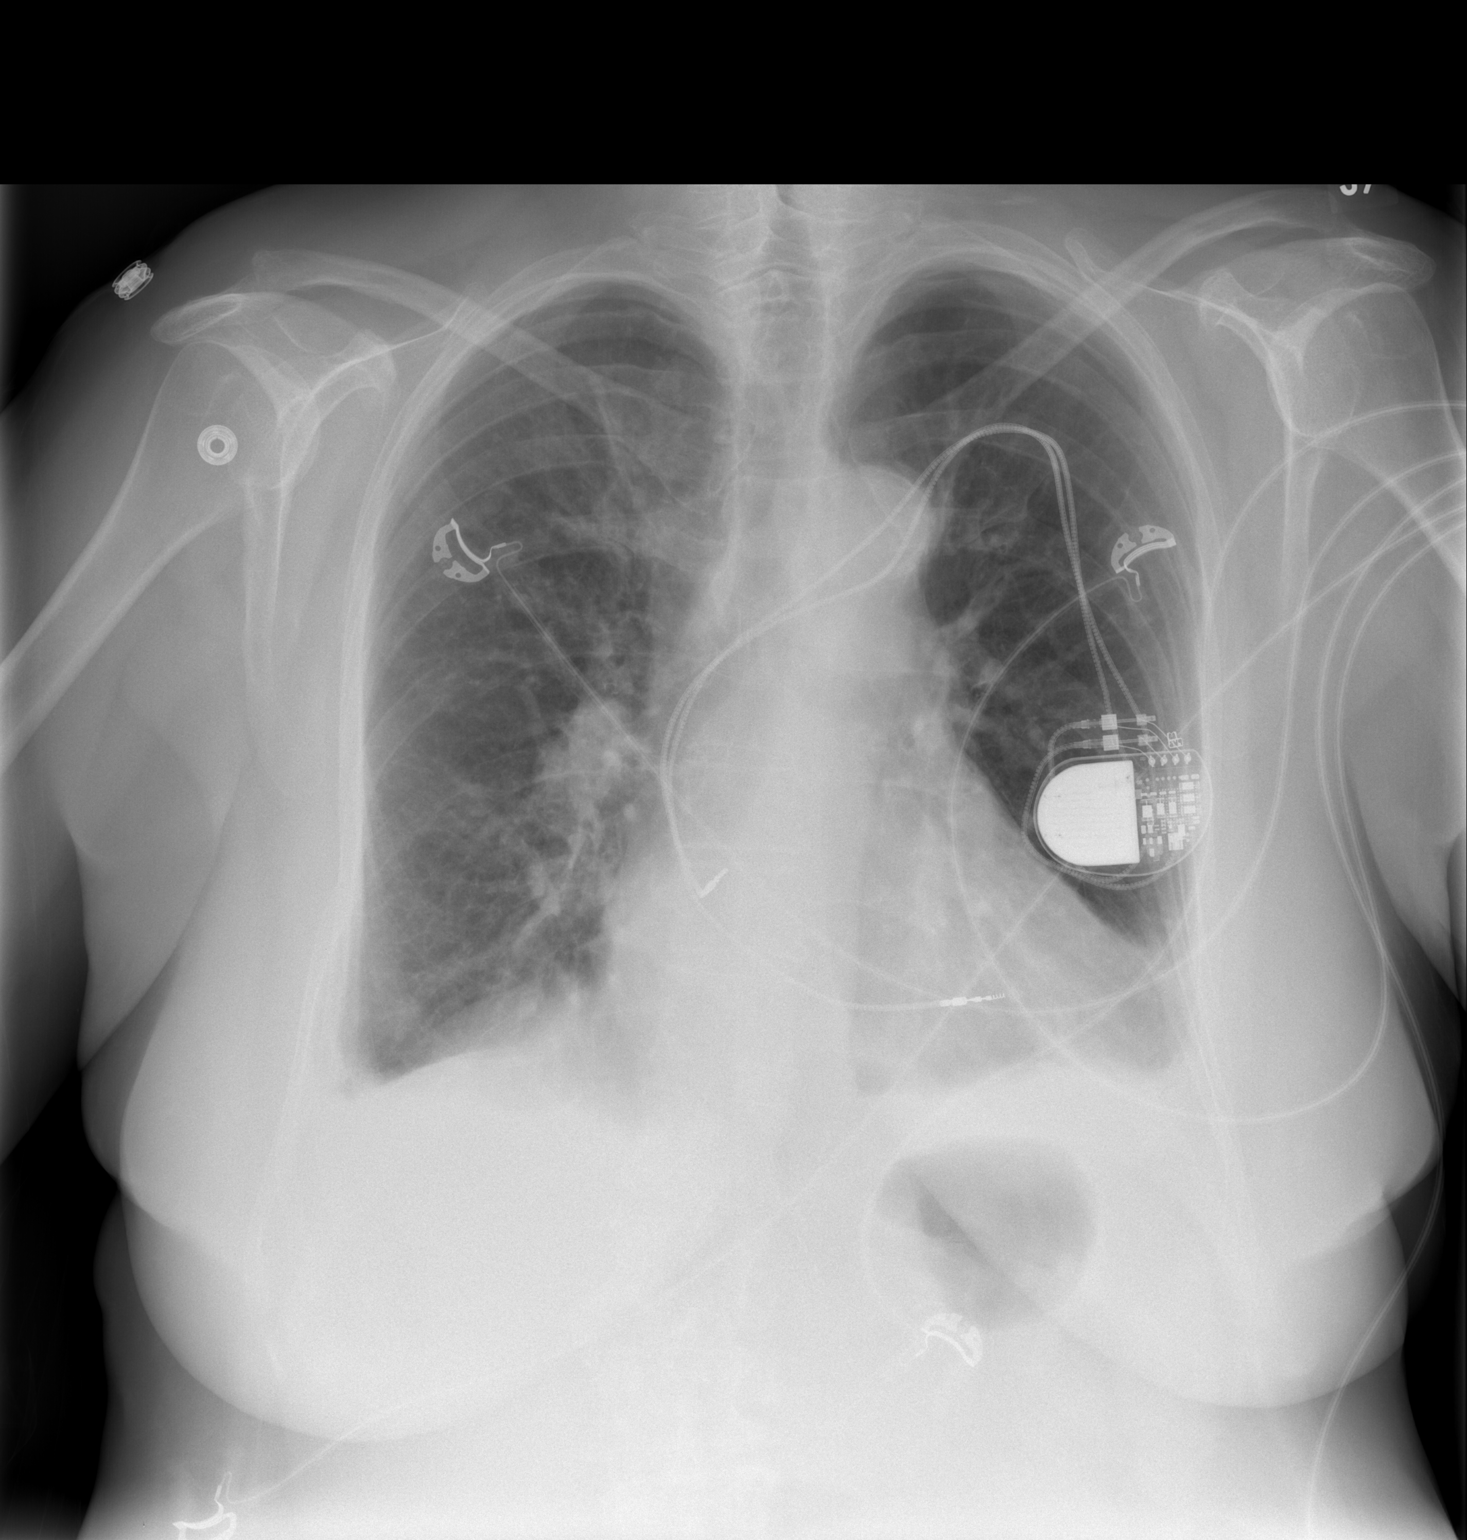

[w chest lat]
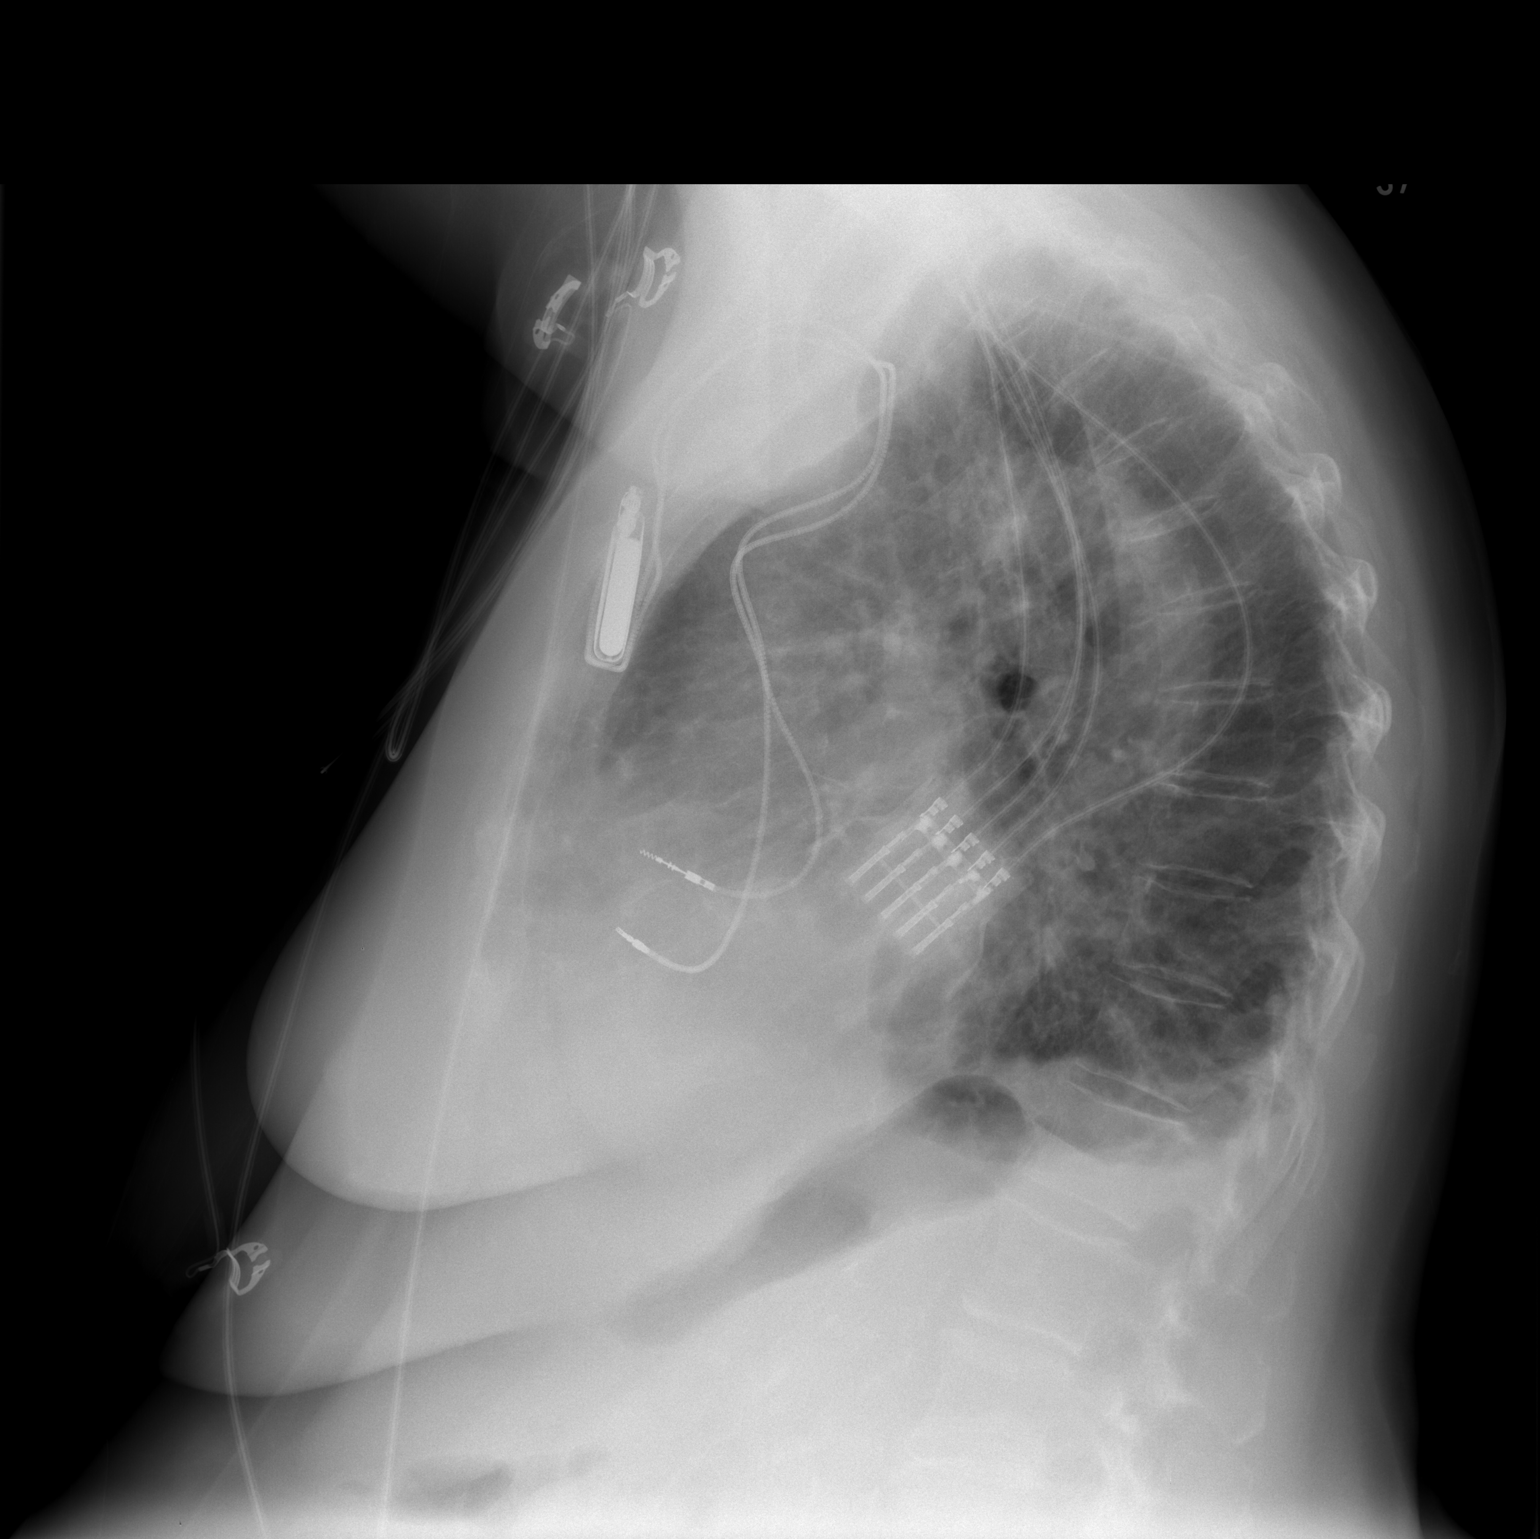

[2 of 2 positions shown; findings below may reference images not displayed]

FINDINGS: Mild bibasilar atelectasis with slightly improved lung
volumes from the prior study.  Small pleural effusions are present.
Negative for edema.  Heart is enlarged with dual lead pacemaker
noted.
IMPRESSION: Mild bibasilar atelectasis.

Interval development of small bilateral pleural effusions without
pulmonary edema.

## 2012-08-03 ENCOUNTER — Other Ambulatory Visit: Payer: Self-pay

## 2012-08-03 MED ORDER — WARFARIN SODIUM 5 MG PO TABS: 5.0000 mg | ORAL_TABLET | Freq: Every day | ORAL | Status: DC

## 2012-08-07 ENCOUNTER — Ambulatory Visit (INDEPENDENT_AMBULATORY_CARE_PROVIDER_SITE_OTHER): Payer: Medicare PPO | Admitting: Family

## 2012-08-07 DIAGNOSIS — I4891 Unspecified atrial fibrillation: Secondary | ICD-10-CM

## 2012-08-07 LAB — POCT INR: INR: 2.4

## 2012-08-07 NOTE — Patient Instructions (Addendum)
Take 1 tab (5mg ) Tuesdays and Thursdays. All other days take 1/2 tab (2.5mg ). Recheck in 6 week.   Anticoagulation Dose Instructions as of 08/07/2012     Dorene Grebe Tue Wed Thu Fri Sat   New Dose 2.5 mg 2.5 mg 5 mg 2.5 mg 5 mg 2.5 mg 2.5 mg    Description       Take 1 tab (5mg ) Tuesdays and Thursdays. All other days take 1/2 tab (2.5mg ). Recheck in 6 week.

## 2012-08-08 ENCOUNTER — Other Ambulatory Visit: Payer: Self-pay | Admitting: Physician Assistant

## 2012-08-08 ENCOUNTER — Other Ambulatory Visit: Payer: Self-pay | Admitting: Family Medicine

## 2012-09-04 ENCOUNTER — Encounter: Payer: Self-pay | Admitting: Internal Medicine

## 2012-09-04 ENCOUNTER — Ambulatory Visit (INDEPENDENT_AMBULATORY_CARE_PROVIDER_SITE_OTHER): Payer: Medicare PPO | Admitting: Internal Medicine

## 2012-09-04 VITALS — BP 156/82 | HR 92 | Wt 165.6 lb

## 2012-09-04 DIAGNOSIS — I5043 Acute on chronic combined systolic (congestive) and diastolic (congestive) heart failure: Secondary | ICD-10-CM

## 2012-09-04 DIAGNOSIS — Z950 Presence of cardiac pacemaker: Secondary | ICD-10-CM

## 2012-09-04 DIAGNOSIS — I4891 Unspecified atrial fibrillation: Secondary | ICD-10-CM

## 2012-09-04 LAB — PACEMAKER DEVICE OBSERVATION
AL AMPLITUDE: 0.7 mv
BMOD-0005RV: 95 {beats}/min
BRDY-0002RV: 70 {beats}/min
RV LEAD IMPEDENCE PM: 538 Ohm
RV LEAD THRESHOLD: 1.5 V

## 2012-09-04 NOTE — Assessment & Plan Note (Signed)
Her ventricular rate is well controlled. No change in medical therapy.

## 2012-09-04 NOTE — Assessment & Plan Note (Signed)
Her Medtronic dual-chamber pacemaker is working normally. We'll plan to recheck in several months. 

## 2012-09-04 NOTE — Assessment & Plan Note (Signed)
Her heart failure symptoms appear to be well compensated. No change in medical therapy.

## 2012-09-04 NOTE — Patient Instructions (Addendum)
Your physician wants you to follow-up in: 6 months with device clinic and 12 months with Dr Taylor You will receive a reminder letter in the mail two months in advance. If you don't receive a letter, please call our office to schedule the follow-up appointment.  

## 2012-09-04 NOTE — Progress Notes (Signed)
HPI Yolanda Huffman returns today for followup. She is a very pleasant 72 year old woman with symptomatic bradycardia, atrial fibrillation, status post permanent pacemaker insertion. In the interim, she has been stable. She is chronically in atrial fibrillation now. She denies chest pain or palpitations. No peripheral edema. No syncope. No Known Allergies   Current Outpatient Prescriptions  Medication Sig Dispense Refill  . atorvastatin (LIPITOR) 20 MG tablet Take 1 tablet (20 mg total) by mouth daily.  30 tablet  11  . calcium carbonate (OS-CAL) 600 MG TABS Take 600 mg by mouth 2 (two) times daily with a meal.       . Cholecalciferol (VITAMIN D) 400 UNIT/ML LIQD Take by mouth.        . colchicine 0.6 MG tablet Take 0.6 mg by mouth as needed.       . fish oil-omega-3 fatty acids 1000 MG capsule Take 2 g by mouth 3 (three) times daily.       . furosemide (LASIX) 40 MG tablet TAKE 2 TABLETS IN THE AM AND 1 TABLET IN THE PM  90 tablet  5  . glucose blood test strip 1 each by Other route 2 (two) times daily. Use as instructed       . insulin detemir (LEVEMIR) 100 UNIT/ML injection Inject 10 Units into the skin at bedtime.      . metoprolol succinate (TOPROL XL) 25 MG 24 hr tablet 2 po qam and 1 po qhs  90 tablet  11  . pantoprazole (PROTONIX) 40 MG tablet TAKE 1 TABLET (40 MG TOTAL) BY MOUTH DAILY.  30 tablet  11  . potassium chloride SA (K-DUR,KLOR-CON) 20 MEQ tablet TAKE 4 TABS PER DAY ON MON/WED/FRI AND TAKE 3 TABS PER DAY ON TU/TH/SA/SU.  96 tablet  10  . warfarin (COUMADIN) 5 MG tablet Take 1 tablet (5 mg total) by mouth daily.  30 tablet  3  . metolazone (ZAROXOLYN) 2.5 MG tablet TAKE 1 TABLET ON M, W, FRI. ONLY; TAKE THIS WITH YOUR LASIX; YOU WILLALSO NEED TO TAKE AN EXTRA POTASSIUM ON THESE DAYS AS WELL  30 tablet  10   No current facility-administered medications for this visit.     Past Medical History  Diagnosis Date  . Diabetes mellitus   . Hyperlipemia   . Hypertension   . CAD  (coronary artery disease)     s/p PCI in [place] in 1/09  . Pacemaker   . Atrial fibrillation     prior Sotalol - d/c'd 2/2 increased creatinine; rate control strategy  . Anemia   . PUD (peptic ulcer disease) 10/2010    duodenal ulcer  . CKD (chronic kidney disease)   . Chronic combined systolic and diastolic heart failure     Echocardiogram 3/12: Mild LVH, EF Q000111Q, normal diastolic function, mild AI, mild MR, PASP 44, normal wall motion    ROS:   All systems reviewed and negative except as noted in the HPI.   Past Surgical History  Procedure Laterality Date  . Pacemaker insertion  07/13/07  . Cataract extraction      01/12/09 and 01/26/09 both eyes  . Stent implant  07/13/2007     Family History  Problem Relation Age of Onset  . Heart disease Father   . Heart attack Father   . Clotting disorder Father     blood clot  . Hypertension Father   . Breast cancer Maternal Aunt   . Breast cancer Sister   . Other Mother  heart condition  . Heart disease Mother      History   Social History  . Marital Status: Widowed    Spouse Name: N/A    Number of Children: N/A  . Years of Education: N/A   Occupational History  . retired [employer]    Social History Main Topics  . Smoking status: Former Smoker -- 2.00 packs/day for 20 years    Types: Cigarettes    Quit date: 06/28/1991  . Smokeless tobacco: Never Used  . Alcohol Use: No  . Drug Use: No  . Sexually Active: Not on file   Other Topics Concern  . Not on file   Social History Narrative   Reg exercise           BP 156/82  Pulse 92  Wt 165 lb 9.6 oz (75.116 kg)  BMI 28.87 kg/m2  Physical Exam:  Well appearing 72 year old woman, NAD HEENT: Unremarkable Neck:  7 cm JVD, no thyromegally Lungs:  Clear with no wheezes, rales, or rhonchi. HEART:  Regular rate rhythm, no murmurs, no rubs, no clicks Abd:  soft, positive bowel sounds, no organomegally, no rebound, no guarding Ext:  2 plus pulses, no edema,  no cyanosis, no clubbing Skin:  No rashes no nodules Neuro:  CN II through XII intact, motor grossly intact   DEVICE  Normal device function.  See PaceArt for details.   Assess/Plan:

## 2012-09-18 ENCOUNTER — Ambulatory Visit (INDEPENDENT_AMBULATORY_CARE_PROVIDER_SITE_OTHER): Payer: Medicare PPO | Admitting: Family

## 2012-09-18 DIAGNOSIS — I4891 Unspecified atrial fibrillation: Secondary | ICD-10-CM

## 2012-09-18 LAB — POCT INR: INR: 1.6

## 2012-09-18 NOTE — Patient Instructions (Addendum)
Take an extra 1/2 tab today and tomorrow. Take 1 tab (5mg ) Tuesdays and Thursdays. All other days take 1/2 tab (2.5mg ). Recheck in 4 week.   Anticoagulation Dose Instructions as of 09/18/2012     Dorene Grebe Tue Wed Thu Fri Sat   New Dose 2.5 mg 2.5 mg 5 mg 2.5 mg 5 mg 2.5 mg 2.5 mg    Description       Take an extra 1/2 tab today and tomorrow. Take 1 tab (5mg ) Tuesdays and Thursdays. All other days take 1/2 tab (2.5mg ). Recheck in 4 week.

## 2012-09-27 ENCOUNTER — Ambulatory Visit (INDEPENDENT_AMBULATORY_CARE_PROVIDER_SITE_OTHER): Payer: Medicare PPO | Admitting: Family Medicine

## 2012-09-27 ENCOUNTER — Encounter: Payer: Self-pay | Admitting: Family Medicine

## 2012-09-27 VITALS — BP 140/70 | Temp 97.5°F | Wt 168.0 lb

## 2012-09-27 DIAGNOSIS — E119 Type 2 diabetes mellitus without complications: Secondary | ICD-10-CM

## 2012-09-27 DIAGNOSIS — N189 Chronic kidney disease, unspecified: Secondary | ICD-10-CM

## 2012-09-27 DIAGNOSIS — I10 Essential (primary) hypertension: Secondary | ICD-10-CM

## 2012-09-27 DIAGNOSIS — I4891 Unspecified atrial fibrillation: Secondary | ICD-10-CM

## 2012-09-27 LAB — HEMOGLOBIN A1C: Hgb A1c MFr Bld: 8 % — ABNORMAL HIGH (ref 4.6–6.5)

## 2012-09-27 NOTE — Patient Instructions (Addendum)

## 2012-09-27 NOTE — Progress Notes (Signed)
  Subjective:    Patient ID: Yolanda Huffman, female    DOB: 09/27/1940, 72 y.o.   MRN: [id number]  HPI Medical followup. Patient has history of multiple problems including type 2 diabetes, CAD, hypertension, atrial fibrillation, chronic kidney disease, hyperlipidemia, osteoporosis Is followed by multiple specialists including nephrology and cardiology. She has upcoming appointment with nephrologist soon. Medications reviewed. Compliant with all. No recent issues with dyspnea or peripheral edema.  Most recent A1c 6 months ago 7.8%. She remains on low-dose Levemir 10 units once daily No recent hypoglycemia. No symptoms of hyperglycemia.  Blood pressure stable. No orthostasis. No chest pains. Lipids were checked last fall and stable at goal with exception of mildly elevated triglycerides.  Past Medical History  Diagnosis Date  . Diabetes mellitus   . Hyperlipemia   . Hypertension   . CAD (coronary artery disease)     s/p PCI in [place] in 1/09  . Pacemaker   . Atrial fibrillation     prior Sotalol - d/c'd 2/2 increased creatinine; rate control strategy  . Anemia   . PUD (peptic ulcer disease) 10/2010    duodenal ulcer  . CKD (chronic kidney disease)   . Chronic combined systolic and diastolic heart failure     Echocardiogram 3/12: Mild LVH, EF Q000111Q, normal diastolic function, mild AI, mild MR, PASP 44, normal wall motion   Past Surgical History  Procedure Laterality Date  . Pacemaker insertion  07/13/07  . Cataract extraction      01/12/09 and 01/26/09 both eyes  . Stent implant  07/13/2007    reports that she quit smoking about 21 years ago. Her smoking use included Cigarettes. She has a 40 pack-year smoking history. She has never used smokeless tobacco. She reports that she does not drink alcohol or use illicit drugs. family history includes Breast cancer in her maternal aunt and sister; Clotting disorder in her father; Heart attack in her father; Heart disease in her father and  mother; Hypertension in her father; and Other in her mother. No Known Allergies     Review of Systems  Constitutional: Negative for fatigue and unexpected weight change.  Eyes: Negative for visual disturbance.  Respiratory: Negative for cough, chest tightness, shortness of breath and wheezing.   Cardiovascular: Negative for chest pain, palpitations and leg swelling.  Endocrine: Negative for polydipsia and polyuria.  Neurological: Negative for dizziness, seizures, syncope, weakness, light-headedness and headaches.       Objective:   Physical Exam  Constitutional: She appears well-developed and well-nourished.  Neck: Neck supple. No thyromegaly present.  Cardiovascular: Normal rate and regular rhythm.   Pulmonary/Chest: Effort normal and breath sounds normal. No respiratory distress. She has no wheezes. She has no rales.  Musculoskeletal: She exhibits no edema.  Lymphadenopathy:    She has no cervical adenopathy.  Skin:  Feet reveal no skin lesions. Good distal foot pulses. Good capillary refill. No calluses. Normal sensation with monofilament testing           Assessment & Plan:  #1 type 2 diabetes. History of fair control. Recheck A1c. Consider titration of Levemir if indicated #2 hypertension. Stable. Followed by nephrology #3 chronic kidney disease. Followed in nephrology. #4 dyslipidemia. Lipids were checked last visit and stable #5 history of atrial fibrillation. Currently in sinus rhythm by exam today. Recent INR subtherapeutic. Followed by Coumadin clinic here

## 2012-09-28 ENCOUNTER — Telehealth: Payer: Self-pay | Admitting: *Deleted

## 2012-09-28 NOTE — Telephone Encounter (Signed)
Message copied by Lamarr Lulas on Fri Sep 28, 2012 11:32 AM ------      Message from: Eulas Post      Created: Thu Sep 27, 2012  9:45 PM       A1C slightly elevated.  Increase Levemir to 12 units once daily ------

## 2012-09-28 NOTE — Telephone Encounter (Signed)
Rx changed on med list

## 2012-10-11 ENCOUNTER — Other Ambulatory Visit: Payer: Self-pay | Admitting: *Deleted

## 2012-10-11 MED ORDER — COLCHICINE 0.6 MG PO TABS: 0.6000 mg | ORAL_TABLET | ORAL | Status: DC | PRN

## 2012-10-16 ENCOUNTER — Encounter: Payer: Medicare PPO | Admitting: Family

## 2012-10-18 ENCOUNTER — Ambulatory Visit (INDEPENDENT_AMBULATORY_CARE_PROVIDER_SITE_OTHER): Payer: Medicare PPO | Admitting: Family

## 2012-10-18 DIAGNOSIS — I4891 Unspecified atrial fibrillation: Secondary | ICD-10-CM

## 2012-10-18 LAB — POCT INR: INR: 1.8

## 2012-10-18 NOTE — Patient Instructions (Addendum)
Take 1 tab (5mg ) Tuesdays, Thursdays, and Saturdays. . All other days take 1/2 tab (2.5mg ). Recheck in 3 week.  Anticoagulation Dose Instructions as of 10/18/2012     Dorene Grebe Tue Wed Thu Fri Sat   New Dose 2.5 mg 2.5 mg 5 mg 2.5 mg 5 mg 2.5 mg 5 mg    Description       Take 1 tab (5mg ) Tuesdays, Thursdays, and Saturdays. . All other days take 1/2 tab (2.5mg ). Recheck in 3 week.

## 2012-10-29 ENCOUNTER — Other Ambulatory Visit: Payer: Self-pay | Admitting: Family Medicine

## 2012-11-08 ENCOUNTER — Encounter: Payer: Medicare PPO | Admitting: Family

## 2012-11-13 ENCOUNTER — Ambulatory Visit (INDEPENDENT_AMBULATORY_CARE_PROVIDER_SITE_OTHER): Payer: Medicare PPO | Admitting: Family

## 2012-11-13 DIAGNOSIS — I4891 Unspecified atrial fibrillation: Secondary | ICD-10-CM

## 2012-11-13 LAB — POCT INR: INR: 1.8

## 2012-11-13 NOTE — Patient Instructions (Addendum)
Take 1 tab (5mg ) Sunday, Tuesdays, Thursdays, and Saturdays. .All other days take 1/2 tab (2.5mg ). Recheck in 4 week.   Anticoagulation Dose Instructions as of 11/13/2012     Dorene Grebe Tue Wed Thu Fri Sat   New Dose 5 mg 2.5 mg 5 mg 2.5 mg 5 mg 2.5 mg 5 mg    Description       Take 1 tab (5mg ) Sunday, Tuesdays, Thursdays, and Saturdays. .All other days take 1/2 tab (2.5mg ). Recheck in 4 week.

## 2012-11-27 LAB — HM DIABETES EYE EXAM: HM Diabetic Eye Exam: NORMAL

## 2012-12-11 ENCOUNTER — Other Ambulatory Visit: Payer: Self-pay | Admitting: Family Medicine

## 2012-12-11 ENCOUNTER — Ambulatory Visit (INDEPENDENT_AMBULATORY_CARE_PROVIDER_SITE_OTHER): Payer: Medicare PPO | Admitting: Family

## 2012-12-11 DIAGNOSIS — I4891 Unspecified atrial fibrillation: Secondary | ICD-10-CM

## 2012-12-11 NOTE — Patient Instructions (Addendum)
Take 1 tab (5mg ) Sunday, Tuesdays, Thursdays, and Saturdays. .All other days take 1/2 tab (2.5mg ). Recheck in 4 week.   Anticoagulation Dose Instructions as of 12/11/2012     Dorene Grebe Tue Wed Thu Fri Sat   New Dose 5 mg 2.5 mg 5 mg 2.5 mg 5 mg 2.5 mg 5 mg    Description       Take 1 tab (5mg ) Sunday, Tuesdays, Thursdays, and Saturdays. .All other days take 1/2 tab (2.5mg ). Recheck in 4 week.

## 2012-12-12 ENCOUNTER — Other Ambulatory Visit: Payer: Self-pay | Admitting: Family Medicine

## 2012-12-13 ENCOUNTER — Other Ambulatory Visit: Payer: Self-pay | Admitting: *Deleted

## 2012-12-13 NOTE — Telephone Encounter (Signed)
Med was filled yesterday, opened in error

## 2012-12-19 ENCOUNTER — Other Ambulatory Visit: Payer: Self-pay | Admitting: Family

## 2013-01-03 ENCOUNTER — Ambulatory Visit (INDEPENDENT_AMBULATORY_CARE_PROVIDER_SITE_OTHER): Payer: Medicare PPO | Admitting: General Practice

## 2013-01-03 DIAGNOSIS — I4891 Unspecified atrial fibrillation: Secondary | ICD-10-CM

## 2013-01-03 LAB — POCT INR: INR: 3.1

## 2013-01-07 ENCOUNTER — Other Ambulatory Visit: Payer: Self-pay | Admitting: Family Medicine

## 2013-01-07 ENCOUNTER — Other Ambulatory Visit: Payer: Self-pay | Admitting: Internal Medicine

## 2013-01-08 ENCOUNTER — Other Ambulatory Visit: Payer: Self-pay | Admitting: Family Medicine

## 2013-01-08 ENCOUNTER — Other Ambulatory Visit: Payer: Self-pay | Admitting: *Deleted

## 2013-01-08 MED ORDER — FUROSEMIDE 40 MG PO TABS: ORAL_TABLET | ORAL | Status: DC

## 2013-01-08 NOTE — Telephone Encounter (Signed)
Refill for 3 months. 

## 2013-01-31 ENCOUNTER — Ambulatory Visit (INDEPENDENT_AMBULATORY_CARE_PROVIDER_SITE_OTHER): Payer: Medicare PPO | Admitting: General Practice

## 2013-01-31 DIAGNOSIS — I4891 Unspecified atrial fibrillation: Secondary | ICD-10-CM

## 2013-01-31 LAB — POCT INR: INR: 3.8

## 2013-02-28 ENCOUNTER — Ambulatory Visit (INDEPENDENT_AMBULATORY_CARE_PROVIDER_SITE_OTHER): Payer: Medicare PPO | Admitting: General Practice

## 2013-02-28 DIAGNOSIS — I4891 Unspecified atrial fibrillation: Secondary | ICD-10-CM

## 2013-02-28 LAB — POCT INR: INR: 2.6

## 2013-03-07 ENCOUNTER — Ambulatory Visit (INDEPENDENT_AMBULATORY_CARE_PROVIDER_SITE_OTHER): Payer: Medicare PPO | Admitting: *Deleted

## 2013-03-07 ENCOUNTER — Encounter: Payer: Self-pay | Admitting: Gastroenterology

## 2013-03-07 DIAGNOSIS — I4891 Unspecified atrial fibrillation: Secondary | ICD-10-CM

## 2013-03-07 LAB — PACEMAKER DEVICE OBSERVATION
BRDY-0002RV: 70 {beats}/min
BRDY-0004RV: 120 {beats}/min
RV LEAD AMPLITUDE: 4 mv
RV LEAD THRESHOLD: 1.25 V

## 2013-03-07 NOTE — Progress Notes (Signed)
PPM check in office. 

## 2013-03-20 ENCOUNTER — Other Ambulatory Visit: Payer: Self-pay | Admitting: Internal Medicine

## 2013-03-27 ENCOUNTER — Encounter: Payer: Self-pay | Admitting: Internal Medicine

## 2013-03-28 ENCOUNTER — Ambulatory Visit: Payer: Medicare PPO

## 2013-03-29 ENCOUNTER — Encounter: Payer: Self-pay | Admitting: Family Medicine

## 2013-03-29 ENCOUNTER — Ambulatory Visit (INDEPENDENT_AMBULATORY_CARE_PROVIDER_SITE_OTHER): Payer: Medicare PPO | Admitting: Family Medicine

## 2013-03-29 VITALS — BP 140/70 | HR 89 | Temp 97.8°F | Wt 169.0 lb

## 2013-03-29 DIAGNOSIS — I4891 Unspecified atrial fibrillation: Secondary | ICD-10-CM

## 2013-03-29 DIAGNOSIS — Z23 Encounter for immunization: Secondary | ICD-10-CM

## 2013-03-29 DIAGNOSIS — E119 Type 2 diabetes mellitus without complications: Secondary | ICD-10-CM

## 2013-03-29 DIAGNOSIS — E785 Hyperlipidemia, unspecified: Secondary | ICD-10-CM

## 2013-03-29 DIAGNOSIS — I2510 Atherosclerotic heart disease of native coronary artery without angina pectoris: Secondary | ICD-10-CM

## 2013-03-29 DIAGNOSIS — I10 Essential (primary) hypertension: Secondary | ICD-10-CM

## 2013-03-29 DIAGNOSIS — N189 Chronic kidney disease, unspecified: Secondary | ICD-10-CM

## 2013-03-29 LAB — LIPID PANEL
Cholesterol: 181 mg/dL (ref 0–200)
HDL: 45.8 mg/dL (ref >39.00)
LDL Cholesterol: 100 mg/dL — ABNORMAL HIGH (ref 0–99)
Total CHOL/HDL Ratio: 4
Triglycerides: 176 mg/dL — ABNORMAL HIGH (ref 0.0–149.0)

## 2013-03-29 LAB — BASIC METABOLIC PANEL
CO2: 28 mEq/L (ref 19–32)
Chloride: 96 mEq/L (ref 96–112)
Creatinine, Ser: 2.2 mg/dL — ABNORMAL HIGH (ref 0.4–1.2)
Sodium: 135 mEq/L (ref 135–145)

## 2013-03-29 LAB — HEMOGLOBIN A1C: Hgb A1c MFr Bld: 7.8 % — ABNORMAL HIGH (ref 4.6–6.5)

## 2013-03-29 LAB — HEPATIC FUNCTION PANEL
AST: 22 U/L (ref 0–37)
Albumin: 3.8 g/dL (ref 3.5–5.2)
Alkaline Phosphatase: 52 U/L (ref 39–117)
Total Protein: 8.1 g/dL (ref 6.0–8.3)

## 2013-03-29 LAB — HM DIABETES FOOT EXAM: HM Diabetic Foot Exam: NORMAL

## 2013-03-29 NOTE — Progress Notes (Signed)
  Subjective:    Patient ID: Yolanda Huffman, female    DOB: [birth year], 72 y.o.   MRN: [id number]  HPI Patient here for follow multiple chronic medical problems Has history of CAD, atrial fibrillation, chronic kidney disease followed by nephrology, type 2 diabetes, osteoporosis, systolic and diastolic heart failure  Patient takes Levemir 12 units once daily for diabetes. She states fasting blood sugars recently low 100 range. Denies hypoglycemia. Denies recent chest pain. Atrial fibrillation rate controlled with metoprolol. She takes Lipitor for hyperlipidemia. Needs followup lab work.  Recent eye exam in June reportedly normal.  Past Medical History  Diagnosis Date  . Diabetes mellitus   . Hyperlipemia   . Hypertension   . CAD (coronary artery disease)     s/p PCI in [place] in 1/09  . Pacemaker   . Atrial fibrillation     prior Sotalol - d/c'd 2/2 increased creatinine; rate control strategy  . Anemia   . PUD (peptic ulcer disease) 10/2010    duodenal ulcer  . CKD (chronic kidney disease)   . Chronic combined systolic and diastolic heart failure     Echocardiogram 3/12: Mild LVH, EF Q000111Q, normal diastolic function, mild AI, mild MR, PASP 44, normal wall motion   Past Surgical History  Procedure Laterality Date  . Pacemaker insertion  07/13/07  . Cataract extraction      01/12/09 and 01/26/09 both eyes  . Stent implant  07/13/2007    reports that she quit smoking about 21 years ago. Her smoking use included Cigarettes. She has a 40 pack-year smoking history. She has never used smokeless tobacco. She reports that she does not drink alcohol or use illicit drugs. family history includes Breast cancer in her maternal aunt and sister; Clotting disorder in her father; Heart attack in her father; Heart disease in her father and mother; Hypertension in her father; Other in her mother. No Known Allergies   Review of Systems  Constitutional: Negative for fatigue and unexpected weight change.   Eyes: Negative for visual disturbance.  Respiratory: Negative for cough, chest tightness, shortness of breath and wheezing.   Cardiovascular: Negative for chest pain, palpitations and leg swelling.  Gastrointestinal: Negative for abdominal pain.  Genitourinary: Negative for dysuria.  Neurological: Negative for dizziness, seizures, syncope, weakness, light-headedness and headaches.       Objective:   Physical Exam  Constitutional: She appears well-developed and well-nourished.  Neck: Neck supple. No thyromegaly present.  Cardiovascular: Normal rate.   Pulmonary/Chest: Breath sounds normal. No respiratory distress. She has no wheezes. She has no rales.  Musculoskeletal: She exhibits no edema.  Skin:  Feet reveal no lesions. No calluses. Good capillary refill. Good dorsalis pedis pulses. No lesions.          Assessment & Plan:  #1 type 2 diabetes. History of fair control. Recheck A1c. Continue yearly eye exam #2 hyperlipidemia. Repeat lipid and hepatic panel #3 chronic kidney disease followed by nephrology. Recheck basic metabolic panel #4 history of atrial fibrillation rate controlled with Toprol and on Coumadin #5 health maintenance. Flu vaccine given

## 2013-04-02 ENCOUNTER — Other Ambulatory Visit: Payer: Self-pay | Admitting: Family Medicine

## 2013-04-04 ENCOUNTER — Ambulatory Visit (INDEPENDENT_AMBULATORY_CARE_PROVIDER_SITE_OTHER): Payer: Medicare PPO | Admitting: General Practice

## 2013-04-04 DIAGNOSIS — I4891 Unspecified atrial fibrillation: Secondary | ICD-10-CM

## 2013-04-04 LAB — POCT INR: INR: 2.9

## 2013-04-15 ENCOUNTER — Other Ambulatory Visit: Payer: Self-pay | Admitting: Internal Medicine

## 2013-04-17 ENCOUNTER — Other Ambulatory Visit: Payer: Self-pay

## 2013-04-17 ENCOUNTER — Other Ambulatory Visit: Payer: Self-pay | Admitting: Family

## 2013-04-17 DIAGNOSIS — Z1231 Encounter for screening mammogram for malignant neoplasm of breast: Secondary | ICD-10-CM

## 2013-05-02 ENCOUNTER — Ambulatory Visit (INDEPENDENT_AMBULATORY_CARE_PROVIDER_SITE_OTHER): Payer: Medicare PPO | Admitting: General Practice

## 2013-05-02 DIAGNOSIS — Z7901 Long term (current) use of anticoagulants: Secondary | ICD-10-CM

## 2013-05-02 DIAGNOSIS — N2581 Secondary hyperparathyroidism of renal origin: Secondary | ICD-10-CM

## 2013-05-02 DIAGNOSIS — I4891 Unspecified atrial fibrillation: Secondary | ICD-10-CM

## 2013-05-02 DIAGNOSIS — D509 Iron deficiency anemia, unspecified: Secondary | ICD-10-CM

## 2013-05-02 LAB — CBC WITH DIFFERENTIAL/PLATELET
Basophils Relative: 0.5 % (ref 0.0–3.0)
Eosinophils Relative: 1.4 % (ref 0.0–5.0)
HCT: 35.3 % — ABNORMAL LOW (ref 36.0–46.0)
Hemoglobin: 12 g/dL (ref 12.0–15.0)
Lymphs Abs: 1.4 10*3/uL (ref 0.7–4.0)
MCHC: 33.9 g/dL (ref 30.0–36.0)
Monocytes Absolute: 0.6 10*3/uL (ref 0.1–1.0)
Monocytes Relative: 7 % (ref 3.0–12.0)
Platelets: 218 10*3/uL (ref 150.0–400.0)
WBC: 7.9 10*3/uL (ref 4.5–10.5)

## 2013-05-02 LAB — COMPREHENSIVE METABOLIC PANEL
ALT: 17 U/L (ref 0–35)
Albumin: 3.5 g/dL (ref 3.5–5.2)
CO2: 25 mEq/L (ref 19–32)
Calcium: 9.4 mg/dL (ref 8.4–10.5)
Creatinine, Ser: 2 mg/dL — ABNORMAL HIGH (ref 0.4–1.2)
GFR: 26.12 mL/min — ABNORMAL LOW (ref >60.00)
Glucose, Bld: 97 mg/dL (ref 70–99)
Potassium: 3.5 mEq/L (ref 3.5–5.1)
Total Bilirubin: 0.8 mg/dL (ref 0.3–1.2)
Total Protein: 8 g/dL (ref 6.0–8.3)

## 2013-05-02 LAB — CK: Total CK: 103 U/L (ref 7–177)

## 2013-05-02 LAB — IBC PANEL
Iron: 63 ug/dL (ref 42–145)
Saturation Ratios: 22.2 % (ref 20.0–50.0)
Transferrin: 203 mg/dL — ABNORMAL LOW (ref 212.0–360.0)

## 2013-05-02 LAB — POCT URINALYSIS DIPSTICK
Bilirubin, UA: NEGATIVE
Blood, UA: 1
Ketones, UA: NEGATIVE
Protein, UA: 1
Spec Grav, UA: 1.01
pH, UA: 6.5

## 2013-05-24 ENCOUNTER — Ambulatory Visit: Payer: Medicare PPO

## 2013-05-30 ENCOUNTER — Ambulatory Visit (INDEPENDENT_AMBULATORY_CARE_PROVIDER_SITE_OTHER): Payer: Medicare PPO | Admitting: General Practice

## 2013-05-30 DIAGNOSIS — Z7901 Long term (current) use of anticoagulants: Secondary | ICD-10-CM

## 2013-05-30 DIAGNOSIS — I4891 Unspecified atrial fibrillation: Secondary | ICD-10-CM

## 2013-05-30 LAB — POCT INR: INR: 2.1

## 2013-05-30 NOTE — Progress Notes (Signed)
Pre-visit discussion using our clinic review tool. No additional management support is needed unless otherwise documented below in the visit note.  

## 2013-06-06 ENCOUNTER — Other Ambulatory Visit: Payer: Self-pay | Admitting: Internal Medicine

## 2013-06-12 ENCOUNTER — Ambulatory Visit
Admission: RE | Admit: 2013-06-12 | Discharge: 2013-06-12 | Disposition: A | Discharge status: Home / Self Care | Payer: Medicare PPO | Source: Ambulatory Visit

## 2013-06-12 DIAGNOSIS — Z1231 Encounter for screening mammogram for malignant neoplasm of breast: Secondary | ICD-10-CM

## 2013-06-13 ENCOUNTER — Telehealth: Payer: Self-pay | Admitting: Family Medicine

## 2013-06-13 NOTE — Telephone Encounter (Signed)
Pt received flu shot on 03/29/13

## 2013-06-13 NOTE — Telephone Encounter (Signed)
Pt had flu shot at our office

## 2013-06-24 ENCOUNTER — Other Ambulatory Visit: Payer: Self-pay | Admitting: Family Medicine

## 2013-07-04 ENCOUNTER — Ambulatory Visit (INDEPENDENT_AMBULATORY_CARE_PROVIDER_SITE_OTHER): Payer: Medicare PPO | Admitting: General Practice

## 2013-07-04 DIAGNOSIS — I4891 Unspecified atrial fibrillation: Secondary | ICD-10-CM

## 2013-07-04 LAB — POCT INR: INR: 2.1

## 2013-07-04 NOTE — Progress Notes (Signed)
Pre-visit discussion using our clinic review tool. No additional management support is needed unless otherwise documented below in the visit note.  

## 2013-07-09 ENCOUNTER — Other Ambulatory Visit: Payer: Self-pay | Admitting: Family Medicine

## 2013-07-11 ENCOUNTER — Other Ambulatory Visit: Payer: Self-pay | Admitting: Internal Medicine

## 2013-07-25 ENCOUNTER — Other Ambulatory Visit: Payer: Self-pay | Admitting: General Practice

## 2013-07-25 ENCOUNTER — Other Ambulatory Visit: Payer: Self-pay | Admitting: Family

## 2013-07-25 MED ORDER — WARFARIN SODIUM 5 MG PO TABS: ORAL_TABLET | ORAL | Status: DC

## 2013-08-01 ENCOUNTER — Ambulatory Visit (INDEPENDENT_AMBULATORY_CARE_PROVIDER_SITE_OTHER): Payer: Medicare PPO | Admitting: General Practice

## 2013-08-01 DIAGNOSIS — Z5181 Encounter for therapeutic drug level monitoring: Secondary | ICD-10-CM

## 2013-08-01 DIAGNOSIS — Z7189 Other specified counseling: Secondary | ICD-10-CM | POA: Insufficient documentation

## 2013-08-01 DIAGNOSIS — I4891 Unspecified atrial fibrillation: Secondary | ICD-10-CM

## 2013-08-01 LAB — POCT INR: INR: 2.1

## 2013-08-01 NOTE — Progress Notes (Signed)
Pre-visit discussion using our clinic review tool. No additional management support is needed unless otherwise documented below in the visit note.  

## 2013-08-18 ENCOUNTER — Other Ambulatory Visit: Payer: Self-pay | Admitting: Internal Medicine

## 2013-08-18 ENCOUNTER — Other Ambulatory Visit: Payer: Self-pay | Admitting: Family Medicine

## 2013-08-21 ENCOUNTER — Telehealth: Payer: Self-pay

## 2013-08-21 NOTE — Telephone Encounter (Signed)
Can you please call patient and make appt to be seen. To discuss medication.

## 2013-08-21 NOTE — Telephone Encounter (Signed)
done

## 2013-09-12 ENCOUNTER — Ambulatory Visit (INDEPENDENT_AMBULATORY_CARE_PROVIDER_SITE_OTHER): Payer: Medicare PPO | Admitting: General Practice

## 2013-09-12 ENCOUNTER — Ambulatory Visit (INDEPENDENT_AMBULATORY_CARE_PROVIDER_SITE_OTHER): Payer: Medicare PPO | Admitting: Family Medicine

## 2013-09-12 ENCOUNTER — Encounter: Payer: Self-pay | Admitting: Family Medicine

## 2013-09-12 VITALS — BP 136/74 | HR 86

## 2013-09-12 DIAGNOSIS — I4891 Unspecified atrial fibrillation: Secondary | ICD-10-CM

## 2013-09-12 DIAGNOSIS — E785 Hyperlipidemia, unspecified: Secondary | ICD-10-CM

## 2013-09-12 DIAGNOSIS — I5043 Acute on chronic combined systolic (congestive) and diastolic (congestive) heart failure: Secondary | ICD-10-CM

## 2013-09-12 DIAGNOSIS — I10 Essential (primary) hypertension: Secondary | ICD-10-CM

## 2013-09-12 DIAGNOSIS — E119 Type 2 diabetes mellitus without complications: Secondary | ICD-10-CM

## 2013-09-12 DIAGNOSIS — Z5181 Encounter for therapeutic drug level monitoring: Secondary | ICD-10-CM

## 2013-09-12 DIAGNOSIS — N189 Chronic kidney disease, unspecified: Secondary | ICD-10-CM

## 2013-09-12 LAB — CBC WITH DIFFERENTIAL/PLATELET
Basophils Absolute: 0 10*3/uL (ref 0.0–0.1)
Basophils Relative: 0 % (ref 0.0–3.0)
Eosinophils Absolute: 0 10*3/uL (ref 0.0–0.7)
Eosinophils Relative: 0.4 % (ref 0.0–5.0)
HCT: 36 % (ref 36.0–46.0)
HEMOGLOBIN: 12.1 g/dL (ref 12.0–15.0)
Lymphocytes Relative: 9.4 % — ABNORMAL LOW (ref 12.0–46.0)
Lymphs Abs: 1 10*3/uL (ref 0.7–4.0)
MCHC: 33.8 g/dL (ref 30.0–36.0)
MCV: 87.6 fl (ref 78.0–100.0)
MONOS PCT: 4.8 % (ref 3.0–12.0)
Monocytes Absolute: 0.5 10*3/uL (ref 0.1–1.0)
NEUTROS ABS: 8.6 10*3/uL — AB (ref 1.4–7.7)
Neutrophils Relative %: 85.4 % — ABNORMAL HIGH (ref 43.0–77.0)
Platelets: 202 10*3/uL (ref 150.0–400.0)
RBC: 4.11 Mil/uL (ref 3.87–5.11)
RDW: 14.6 % (ref 11.5–14.6)
WBC: 10.1 10*3/uL (ref 4.5–10.5)

## 2013-09-12 LAB — HEPATIC FUNCTION PANEL
ALBUMIN: 3.6 g/dL (ref 3.5–5.2)
ALT: 22 U/L (ref 0–35)
AST: 26 U/L (ref 0–37)
Alkaline Phosphatase: 67 U/L (ref 39–117)
Bilirubin, Direct: 0.1 mg/dL (ref 0.0–0.3)
Total Bilirubin: 0.9 mg/dL (ref 0.3–1.2)
Total Protein: 8.1 g/dL (ref 6.0–8.3)

## 2013-09-12 LAB — BASIC METABOLIC PANEL
BUN: 43 mg/dL — ABNORMAL HIGH (ref 6–23)
CALCIUM: 9 mg/dL (ref 8.4–10.5)
CO2: 26 meq/L (ref 19–32)
Chloride: 100 mEq/L (ref 96–112)
Creatinine, Ser: 2.2 mg/dL — ABNORMAL HIGH (ref 0.4–1.2)
GFR: 23.74 mL/min — AB (ref >60.00)
GLUCOSE: 160 mg/dL — AB (ref 70–99)
Potassium: 3.2 mEq/L — ABNORMAL LOW (ref 3.5–5.1)
Sodium: 136 mEq/L (ref 135–145)

## 2013-09-12 LAB — POCT INR: INR: 2.3

## 2013-09-12 LAB — HEMOGLOBIN A1C: Hgb A1c MFr Bld: 7.7 % — ABNORMAL HIGH (ref 4.6–6.5)

## 2013-09-12 LAB — PHOSPHORUS: PHOSPHORUS: 3.6 mg/dL (ref 2.3–4.6)

## 2013-09-12 NOTE — Progress Notes (Signed)
Pre visit review using our clinic review tool, if applicable. No additional management support is needed unless otherwise documented below in the visit note. 

## 2013-09-12 NOTE — Progress Notes (Signed)
Subjective:    Patient ID: Yolanda Huffman, female    DOB: [birth year], 73 y.o.   MRN: [id number]  HPI Patient is seen for medical followup. She has history of type 2 diabetes, CAD, history of acute on chronic combined systolic and diastolic heart failure , hypertension, atrial fibrillation, stage III chronic kidney disease, dyslipidemia. Last A1c 7.8%. Rare hypoglycemic episode. She remains on low-dose long-acting insulin. Blood sugars been relatively stable. No symptoms of hyperglycemia.  She is maintained on Toprol succinate extended release 25 mg and because of insurance is being required to make change. She brings in a letter from her insurance come and this letter does not list alternative medications. We spent over 15 minutes on phone with insurance company and made no progress with trying to figure out what alternatives are.  Her blood pressures been stable. No dizziness. No falls. No chest pain. Compliant with medications.  Past Medical History  Diagnosis Date  . Diabetes mellitus   . Hyperlipemia   . Hypertension   . CAD (coronary artery disease)     s/p PCI in [place] in 1/09  . Pacemaker   . Atrial fibrillation     prior Sotalol - d/c'd 2/2 increased creatinine; rate control strategy  . Anemia   . PUD (peptic ulcer disease) 10/2010    duodenal ulcer  . CKD (chronic kidney disease)   . Chronic combined systolic and diastolic heart failure     Echocardiogram 3/12: Mild LVH, EF Q000111Q, normal diastolic function, mild AI, mild MR, PASP 44, normal wall motion   Past Surgical History  Procedure Laterality Date  . Pacemaker insertion  07/13/07  . Cataract extraction      01/12/09 and 01/26/09 both eyes  . Stent implant  07/13/2007    reports that she quit smoking about 22 years ago. Her smoking use included Cigarettes. She has a 40 pack-year smoking history. She has never used smokeless tobacco. She reports that she does not drink alcohol or use illicit drugs. family history includes  Breast cancer in her maternal aunt and sister; Clotting disorder in her father; Heart attack in her father; Heart disease in her father and mother; Hypertension in her father; Other in her mother. No Known Allergies    Review of Systems  Constitutional: Negative for fatigue and unexpected weight change.  Eyes: Negative for visual disturbance.  Respiratory: Negative for cough, chest tightness, shortness of breath and wheezing.   Cardiovascular: Negative for chest pain, palpitations and leg swelling.  Endocrine: Negative for polydipsia and polyuria.  Genitourinary: Negative for dysuria.  Neurological: Negative for dizziness, seizures, syncope, weakness, light-headedness and headaches.       Objective:   Physical Exam  Constitutional: She is oriented to person, place, and time. She appears well-developed and well-nourished.  Neck: Neck supple. No thyromegaly present.  Cardiovascular: Normal rate.   Pulmonary/Chest: Effort normal and breath sounds normal. No respiratory distress. She has no wheezes. She has no rales.  Musculoskeletal: She exhibits no edema.  Neurological: She is alert and oriented to person, place, and time. No cranial nerve deficit.          Assessment & Plan:  #1 type 2 diabetes. History of fair control. Recheck A1c #2 chronic kidney disease. Obtain labs including conference metabolic panel, CBC, intact PTH, phosphorus level to be sent to urology #3 history of diastolic heart failure. Patient been very stable on current regimen. Insurance come knee is requesting change of medication he we've asked patient to  see she can verify what alternatives are #4 atrial fibrillation on Coumadin. Recheck INR today

## 2013-09-13 LAB — PARATHYROID HORMONE, INTACT (NO CA): PTH: 240.8 pg/mL — AB (ref 14.0–72.0)

## 2013-09-18 ENCOUNTER — Encounter: Payer: Medicare PPO | Admitting: Internal Medicine

## 2013-09-28 ENCOUNTER — Other Ambulatory Visit: Payer: Self-pay | Admitting: Family Medicine

## 2013-09-30 ENCOUNTER — Other Ambulatory Visit: Payer: Self-pay | Admitting: Family Medicine

## 2013-10-03 ENCOUNTER — Encounter: Payer: Self-pay | Admitting: Family Medicine

## 2013-10-03 ENCOUNTER — Telehealth: Payer: Self-pay | Admitting: Family Medicine

## 2013-10-03 ENCOUNTER — Ambulatory Visit (INDEPENDENT_AMBULATORY_CARE_PROVIDER_SITE_OTHER): Payer: Medicare PPO | Admitting: Family Medicine

## 2013-10-03 VITALS — BP 134/68 | HR 88 | Wt 170.0 lb

## 2013-10-03 DIAGNOSIS — I10 Essential (primary) hypertension: Secondary | ICD-10-CM

## 2013-10-03 DIAGNOSIS — E119 Type 2 diabetes mellitus without complications: Secondary | ICD-10-CM

## 2013-10-03 DIAGNOSIS — E785 Hyperlipidemia, unspecified: Secondary | ICD-10-CM

## 2013-10-03 DIAGNOSIS — E876 Hypokalemia: Secondary | ICD-10-CM

## 2013-10-03 LAB — BASIC METABOLIC PANEL
BUN: 42 mg/dL — ABNORMAL HIGH (ref 6–23)
CO2: 24 mEq/L (ref 19–32)
Calcium: 9.5 mg/dL (ref 8.4–10.5)
Chloride: 96 mEq/L (ref 96–112)
Creatinine, Ser: 2.1 mg/dL — ABNORMAL HIGH (ref 0.4–1.2)
GFR: 25.21 mL/min — AB (ref >60.00)
GLUCOSE: 109 mg/dL — AB (ref 70–99)
POTASSIUM: 3.5 meq/L (ref 3.5–5.1)
Sodium: 133 mEq/L — ABNORMAL LOW (ref 135–145)

## 2013-10-03 NOTE — Progress Notes (Signed)
   Subjective:    Patient ID: Yolanda Huffman, female    DOB: 1941-04-10, 73 y.o.   MRN: [id number]  HPI Patient seen for routine followup. Refer to recent note. Had several labs. Significant for potassium 3.2. Her nephrologist increased her potassium. Her creatinine is stable at 2.2. She denies any muscle cramps or weakness  Patient has type 2 diabetes. A1c 7.7%. Takes Levemir 12 units once daily. Fasting blood sugars low mostly low 100s. No recent hypoglycemia.  She has history of atrial fibrillation and has pacemaker. She remains on Coumadin. She is getting regular INRs. We recently got approval for her to remain on Toprol. Her insurance was trying to change her over to calcium channel blocker such as verapamil.  Past Medical History  Diagnosis Date  . Diabetes mellitus   . Hyperlipemia   . Hypertension   . CAD (coronary artery disease)     s/p PCI in [place] in 1/09  . Pacemaker   . Atrial fibrillation     prior Sotalol - d/c'd 2/2 increased creatinine; rate control strategy  . Anemia   . PUD (peptic ulcer disease) 10/2010    duodenal ulcer  . CKD (chronic kidney disease)   . Chronic combined systolic and diastolic heart failure     Echocardiogram 3/12: Mild LVH, EF Q000111Q, normal diastolic function, mild AI, mild MR, PASP 44, normal wall motion   Past Surgical History  Procedure Laterality Date  . Pacemaker insertion  07/13/07  . Cataract extraction      01/12/09 and 01/26/09 both eyes  . Stent implant  07/13/2007    reports that she quit smoking about 22 years ago. Her smoking use included Cigarettes. She has a 40 pack-year smoking history. She has never used smokeless tobacco. She reports that she does not drink alcohol or use illicit drugs. family history includes Breast cancer in her maternal aunt and sister; Clotting disorder in her father; Heart attack in her father; Heart disease in her father and mother; Hypertension in her father; Other in her mother. No Known  Allergies    Review of Systems  Constitutional: Negative for fatigue.  Eyes: Negative for visual disturbance.  Respiratory: Negative for cough, chest tightness, shortness of breath and wheezing.   Cardiovascular: Negative for chest pain, palpitations and leg swelling.  Neurological: Negative for dizziness, seizures, syncope, weakness, light-headedness and headaches.       Objective:   Physical Exam  Constitutional: She appears well-developed and well-nourished.  Neck: Neck supple. No thyromegaly present.  Cardiovascular: Normal rate and regular rhythm.   Pulmonary/Chest: Effort normal and breath sounds normal. No respiratory distress. She has no wheezes. She has no rales.  Musculoskeletal: She exhibits no edema.          Assessment & Plan:  #1 hypokalemia. Recheck basic metabolic panel #2 type 2 diabetes. Fair control. Continue current regimen. We discussed possible addition of short-acting meal time insulin but she had recent A1c 7.7% and will continue current regimen #3 history of atrial fibrillation currently regular rhythm. Continue metoprolol. She has followup with cardiology soon #4 CKD.  Followed by nephrology.  Recent creatinine stable.

## 2013-10-03 NOTE — Telephone Encounter (Signed)
Relevant patient education mailed to patient.  

## 2013-10-03 NOTE — Progress Notes (Signed)
Pre visit review using our clinic review tool, if applicable. No additional management support is needed unless otherwise documented below in the visit note. 

## 2013-10-04 ENCOUNTER — Other Ambulatory Visit: Payer: Self-pay | Admitting: Internal Medicine

## 2013-10-08 ENCOUNTER — Encounter: Payer: Self-pay | Admitting: Cardiology

## 2013-10-08 ENCOUNTER — Telehealth: Payer: Self-pay | Admitting: *Deleted

## 2013-10-08 ENCOUNTER — Ambulatory Visit (INDEPENDENT_AMBULATORY_CARE_PROVIDER_SITE_OTHER): Payer: Medicare PPO | Admitting: Cardiology

## 2013-10-08 VITALS — BP 140/80 | HR 93 | Wt 168.0 lb

## 2013-10-08 DIAGNOSIS — R001 Bradycardia, unspecified: Secondary | ICD-10-CM

## 2013-10-08 DIAGNOSIS — I4891 Unspecified atrial fibrillation: Secondary | ICD-10-CM

## 2013-10-08 DIAGNOSIS — Z950 Presence of cardiac pacemaker: Secondary | ICD-10-CM

## 2013-10-08 DIAGNOSIS — I498 Other specified cardiac arrhythmias: Secondary | ICD-10-CM

## 2013-10-08 DIAGNOSIS — I5032 Chronic diastolic (congestive) heart failure: Secondary | ICD-10-CM

## 2013-10-08 LAB — MDC_IDC_ENUM_SESS_TYPE_INCLINIC
Battery Impedance: 1278 Ohm
Brady Statistic RV Percent Paced: 98 %
Lead Channel Impedance Value: 464 Ohm
Lead Channel Pacing Threshold Pulse Width: 0.4 ms
Lead Channel Sensing Intrinsic Amplitude: 4 mV
Lead Channel Setting Pacing Amplitude: 2.5 V
Lead Channel Setting Sensing Sensitivity: 2 mV
MDC IDC MSMT BATTERY REMAINING LONGEVITY: 46 mo
MDC IDC MSMT BATTERY VOLTAGE: 2.77 V
MDC IDC MSMT LEADCHNL RV PACING THRESHOLD AMPLITUDE: 1.25 V
MDC IDC SESS DTM: 20150414140845
MDC IDC SET LEADCHNL RV PACING PULSEWIDTH: 0.4 ms

## 2013-10-08 NOTE — Progress Notes (Signed)
ELECTROPHYSIOLOGY OFFICE NOTE   Patient ID: Yolanda Huffman MRN: [id number], DOB/AGE: Nov 17, 1940   Date of Visit: 10/08/2013  Primary Physician: Elease Hashimoto, MD Primary EP: Lovena Le, MD Primary Nephrologist: Verneda Skill, MD Reason for Visit: EP/device follow-up  History of Present Illness  BRENNLEY Huffman is a 73 y.o. female with permanent AFib, bradycardia s/p PPM implant, CAD, CKD and chronic combined systolic and diastolic HF (LVEF Q000111Q by echo March 2012) who presents today for routine electrophysiology followup. Since last being seen in our clinic, she reports she is doing well and has no complaints. She denies chest pain or shortness of breath. She denies palpitations, dizziness, near syncope or syncope. She denies LE swelling, orthopnea or PND. She is compliant with low sodium diet and medications.  Past Medical History Past Medical History  Diagnosis Date  . Diabetes mellitus   . Hyperlipemia   . Hypertension   . CAD (coronary artery disease)     s/p PCI in [place] in 1/09  . Pacemaker   . Atrial fibrillation     prior Sotalol - d/c'd 2/2 increased creatinine; rate control strategy  . Anemia   . PUD (peptic ulcer disease) 10/2010    duodenal ulcer  . CKD (chronic kidney disease)   . Chronic combined systolic and diastolic heart failure     Echocardiogram 3/12: Mild LVH, EF Q000111Q, normal diastolic function, mild AI, mild MR, PASP 44, normal wall motion    Past Surgical History Past Surgical History  Procedure Laterality Date  . Pacemaker insertion  07/13/07  . Cataract extraction      01/12/09 and 01/26/09 both eyes  . Stent implant  07/13/2007    Allergies/Intolerances No Known Allergies  Current Home Medications Current Outpatient Prescriptions  Medication Sig Dispense Refill  . atorvastatin (LIPITOR) 20 MG tablet Take 1 tablet (20 mg total) by mouth daily.  30 tablet  11  . COLCRYS 0.6 MG tablet TAKE 1 TABLET (0.6 MG TOTAL) BY MOUTH AS NEEDED AS DIRECTED.  30 tablet  1    . fish oil-omega-3 fatty acids 1000 MG capsule Take 1 g by mouth 3 (three) times daily.       . furosemide (LASIX) 40 MG tablet TAKE 2 TABLETS IN THE AM AND 1 TABLET IN THE PM  90 tablet  0  . glucose blood test strip 1 each by Other route 2 (two) times daily. Use as instructed       . insulin detemir (LEVEMIR) 100 UNIT/ML injection Inject 12 Units into the skin at bedtime. And on sliding scale      . metolazone (ZAROXOLYN) 2.5 MG tablet TAKE 1 TABLET ON M, W, FRI. ONLY; TAKE THIS WITH YOUR LASIX; YOU WILL ALSO NEED TO TAKE AN EXTRA POTASSIUM ON THESE DAYS AS WELL  30 tablet  2  . metoprolol succinate (TOPROL-XL) 25 MG 24 hr tablet TAKE 2 TABLETS BY MOUTH EVERY MORNING.  TAKE 1 TABLET BY MOUTH EVERY EVENING.  90 tablet  3  . metoprolol succinate (TOPROL-XL) 25 MG 24 hr tablet TAKE 2 TABLETS BY MOUTH EVERY MORNING.  TAKE 1 TABLET BY MOUTH EVERY EVENING.  90 tablet  1  . pantoprazole (PROTONIX) 40 MG tablet TAKE 1 TABLET (40 MG TOTAL) BY MOUTH DAILY.  30 tablet  10  . potassium chloride SA (K-DUR,KLOR-CON) 20 MEQ tablet TAKE 4 TABS PER DAY      . warfarin (COUMADIN) 5 MG tablet Take as directed by anticoagulation clinic  30 tablet  2  No current facility-administered medications for this visit.    Social History History   Social History  . Marital Status: Widowed    Spouse Name: N/A    Number of Children: N/A  . Years of Education: N/A   Occupational History  . retired [employer]    Social History Main Topics  . Smoking status: Former Smoker -- 2.00 packs/day for 20 years    Types: Cigarettes    Quit date: 06/28/1991  . Smokeless tobacco: Never Used  . Alcohol Use: No  . Drug Use: No  . Sexual Activity: Not on file   Other Topics Concern  . Not on file   Social History Narrative   Reg exercise           Review of Systems General: No chills, fever, night sweats or weight changes Cardiovascular: No chest pain, dyspnea on exertion, edema, orthopnea, palpitations, paroxysmal  nocturnal dyspnea Dermatological: No rash, lesions or masses Respiratory: No cough, dyspnea Urologic: No hematuria, dysuria Abdominal: No nausea, vomiting, diarrhea, bright red blood per rectum, melena, or hematemesis Neurologic: No visual changes, weakness, changes in mental status All other systems reviewed and are otherwise negative except as noted above.  Physical Exam Vitals: Blood pressure 140/80, pulse 93, weight 168 lb (76.204 kg).  General: Well developed, well appearing 73 y.o. female in no acute distress. HEENT: Normocephalic, atraumatic. EOMs intact. Sclera nonicteric. Oropharynx clear.  Neck: Supple. No JVD. Lungs: Respirations regular and unlabored, CTA bilaterally. No wheezes, rales or rhonchi. Heart: Regular. S1, S2 present. No murmurs, rub, S3 or S4. Abdomen: Soft, non-distended.  Extremities: No clubbing, cyanosis or edema. PT/Radials 2+ and equal bilaterally. Psych: Normal affect. Neuro: Alert and oriented X 3. Moves all extremities spontaneously. Skin: Left upper chest / implant site intact and well healed.   Diagnostics Device interrogation today - Normal device function. Threshold, sensing, impedance consistent with previous measurements. Device programmed to maximize longevity. No high ventricular rates noted. Device programmed at appropriate safety margins. Histogram distribution appropriate for patient activity level. Device programmed to optimize intrinsic conduction. Estimated longevity 4 years.   Assessment and Plan  1. Bradycardia s/p PPM implant Normal device function No programming changes made Return for follow-up in device clinic in 6 months Return for follow-up with Dr. Lovena Le in one year  2. Permanent atrial fibrillation Rate controlled, asymptomatic Continue BB Continue warfarin for stroke prevention; followed by Dr. Elease Hashimoto  3. Chronic diastolic HF Stable without worsening HF symptoms Continue medical therapy  Signed, Andrez Grime,  PA-C 10/08/2013, 11:47 AM

## 2013-10-08 NOTE — Patient Instructions (Addendum)
Your physician wants you to follow-up in: 1 year with DR. Lovena Le You will receive a reminder letter in the mail two months in advance. If you don't receive a letter, please call our office to schedule the follow-up appointment.  Your Physician recommends you have a 6 month Device check.

## 2013-10-08 NOTE — Telephone Encounter (Signed)
Left message for pt to give me a call back.

## 2013-10-09 NOTE — Telephone Encounter (Signed)
Spoke with pt to set up 6 month device check

## 2013-10-16 ENCOUNTER — Other Ambulatory Visit: Payer: Self-pay | Admitting: Family Medicine

## 2013-10-18 ENCOUNTER — Telehealth: Payer: Self-pay

## 2013-10-18 NOTE — Telephone Encounter (Signed)
Relevant patient education mailed to patient.  

## 2013-10-23 ENCOUNTER — Encounter: Payer: Self-pay | Admitting: Internal Medicine

## 2013-10-24 ENCOUNTER — Ambulatory Visit (INDEPENDENT_AMBULATORY_CARE_PROVIDER_SITE_OTHER): Payer: Medicare PPO | Admitting: General Practice

## 2013-10-24 DIAGNOSIS — Z5181 Encounter for therapeutic drug level monitoring: Secondary | ICD-10-CM

## 2013-10-24 DIAGNOSIS — I4891 Unspecified atrial fibrillation: Secondary | ICD-10-CM

## 2013-10-24 LAB — POCT INR: INR: 2.3

## 2013-10-24 NOTE — Progress Notes (Signed)
Pre visit review using our clinic review tool, if applicable. No additional management support is needed unless otherwise documented below in the visit note. 

## 2013-10-31 LAB — HM DIABETES EYE EXAM

## 2013-11-04 ENCOUNTER — Other Ambulatory Visit: Payer: Self-pay | Admitting: Internal Medicine

## 2013-11-11 ENCOUNTER — Other Ambulatory Visit: Payer: Self-pay | Admitting: Family Medicine

## 2013-12-05 ENCOUNTER — Ambulatory Visit (INDEPENDENT_AMBULATORY_CARE_PROVIDER_SITE_OTHER): Payer: Medicare PPO | Admitting: General Practice

## 2013-12-05 DIAGNOSIS — I4891 Unspecified atrial fibrillation: Secondary | ICD-10-CM

## 2013-12-05 DIAGNOSIS — Z5181 Encounter for therapeutic drug level monitoring: Secondary | ICD-10-CM

## 2013-12-05 LAB — POCT INR: INR: 2.3

## 2013-12-05 NOTE — Progress Notes (Signed)
Pre visit review using our clinic review tool, if applicable. No additional management support is needed unless otherwise documented below in the visit note. 

## 2013-12-09 ENCOUNTER — Other Ambulatory Visit: Payer: Self-pay | Admitting: Internal Medicine

## 2013-12-10 ENCOUNTER — Other Ambulatory Visit: Payer: Self-pay

## 2013-12-10 MED ORDER — POTASSIUM CHLORIDE CRYS ER 20 MEQ PO TBCR: EXTENDED_RELEASE_TABLET | ORAL | Status: DC

## 2013-12-24 ENCOUNTER — Other Ambulatory Visit: Payer: Self-pay | Admitting: General Practice

## 2013-12-24 ENCOUNTER — Other Ambulatory Visit: Payer: Self-pay | Admitting: Family Medicine

## 2013-12-24 MED ORDER — WARFARIN SODIUM 5 MG PO TABS: ORAL_TABLET | ORAL | Status: DC

## 2014-01-01 ENCOUNTER — Telehealth: Payer: Self-pay | Admitting: Internal Medicine

## 2014-01-01 ENCOUNTER — Other Ambulatory Visit: Payer: Self-pay

## 2014-01-01 ENCOUNTER — Other Ambulatory Visit: Payer: Self-pay | Admitting: Family Medicine

## 2014-01-01 MED ORDER — POTASSIUM CHLORIDE CRYS ER 20 MEQ PO TBCR: EXTENDED_RELEASE_TABLET | ORAL | Status: DC

## 2014-01-01 NOTE — Telephone Encounter (Signed)
New message     Talk to a nurse regarding potassium chloride

## 2014-01-01 NOTE — Telephone Encounter (Signed)
The pt states that her Potassium refill was for 30 tablets and she needs 120 tablets monthly as she take 4 tablets daily. Corrected RX for 120 tablets with 3 refills sent to [facility] per the pts request.

## 2014-01-06 ENCOUNTER — Other Ambulatory Visit: Payer: Self-pay | Admitting: Internal Medicine

## 2014-01-16 ENCOUNTER — Ambulatory Visit (INDEPENDENT_AMBULATORY_CARE_PROVIDER_SITE_OTHER): Payer: Medicare PPO | Admitting: General Practice

## 2014-01-16 DIAGNOSIS — I4891 Unspecified atrial fibrillation: Secondary | ICD-10-CM

## 2014-01-16 DIAGNOSIS — Z5181 Encounter for therapeutic drug level monitoring: Secondary | ICD-10-CM

## 2014-01-16 LAB — POCT INR: INR: 2.4

## 2014-01-16 NOTE — Progress Notes (Signed)
Pre visit review using our clinic review tool, if applicable. No additional management support is needed unless otherwise documented below in the visit note. 

## 2014-01-21 ENCOUNTER — Other Ambulatory Visit: Payer: Self-pay | Admitting: Family Medicine

## 2014-02-04 ENCOUNTER — Other Ambulatory Visit: Payer: Self-pay | Admitting: Internal Medicine

## 2014-02-04 ENCOUNTER — Telehealth: Payer: Self-pay | Admitting: *Deleted

## 2014-02-04 NOTE — Telephone Encounter (Signed)
Pt has an appt with Dr Elease Hashimoto on 04/03/14 for a 6 month f/u.  Pt needs a lipid panel and hgba1c for the diabetic bundle.  Left message for pt to call back.  She needs to be fasting when she comes in

## 2014-02-05 NOTE — Telephone Encounter (Signed)
Changed appt to 04/03/14 at 8:45, pt will come in fasting

## 2014-02-27 ENCOUNTER — Ambulatory Visit (INDEPENDENT_AMBULATORY_CARE_PROVIDER_SITE_OTHER): Payer: Medicare PPO | Admitting: Family

## 2014-02-27 ENCOUNTER — Ambulatory Visit: Payer: Medicare PPO

## 2014-02-27 DIAGNOSIS — Z5181 Encounter for therapeutic drug level monitoring: Secondary | ICD-10-CM

## 2014-02-27 DIAGNOSIS — I482 Chronic atrial fibrillation, unspecified: Secondary | ICD-10-CM

## 2014-02-27 DIAGNOSIS — I4891 Unspecified atrial fibrillation: Secondary | ICD-10-CM

## 2014-02-27 LAB — POCT INR: INR: 2.9

## 2014-02-27 NOTE — Patient Instructions (Signed)
Continue to take 1/2 tablet daily except take 1 tablet on M/W/F.  Re-check in 6 weeks.   Anticoagulation Dose Instructions as of 02/27/2014     Dorene Grebe Tue Wed Thu Fri Sat   New Dose 2.5 mg 5 mg 2.5 mg 5 mg 2.5 mg 5 mg 2.5 mg    Description       Continue to take 1/2 tablet daily except take 1 tablet on M/W/F.  Re-check in 6 weeks.

## 2014-03-27 ENCOUNTER — Other Ambulatory Visit: Payer: Self-pay | Admitting: Family Medicine

## 2014-03-28 ENCOUNTER — Other Ambulatory Visit: Payer: Self-pay | Admitting: *Deleted

## 2014-03-28 MED ORDER — POTASSIUM CHLORIDE CRYS ER 20 MEQ PO TBCR: EXTENDED_RELEASE_TABLET | ORAL | Status: DC

## 2014-04-03 ENCOUNTER — Encounter: Payer: Self-pay | Admitting: Family Medicine

## 2014-04-03 ENCOUNTER — Ambulatory Visit (INDEPENDENT_AMBULATORY_CARE_PROVIDER_SITE_OTHER): Payer: Medicare PPO | Admitting: *Deleted

## 2014-04-03 ENCOUNTER — Ambulatory Visit: Payer: Medicare PPO | Admitting: Family Medicine

## 2014-04-03 ENCOUNTER — Ambulatory Visit (INDEPENDENT_AMBULATORY_CARE_PROVIDER_SITE_OTHER): Payer: Medicare PPO | Admitting: Family

## 2014-04-03 ENCOUNTER — Encounter: Payer: Self-pay | Admitting: Internal Medicine

## 2014-04-03 ENCOUNTER — Ambulatory Visit (INDEPENDENT_AMBULATORY_CARE_PROVIDER_SITE_OTHER): Payer: Medicare PPO | Admitting: Family Medicine

## 2014-04-03 ENCOUNTER — Ambulatory Visit: Payer: Medicare PPO

## 2014-04-03 VITALS — BP 140/70 | HR 87 | Temp 98.1°F | Wt 167.0 lb

## 2014-04-03 DIAGNOSIS — I10 Essential (primary) hypertension: Secondary | ICD-10-CM

## 2014-04-03 DIAGNOSIS — I482 Chronic atrial fibrillation, unspecified: Secondary | ICD-10-CM

## 2014-04-03 DIAGNOSIS — Z5181 Encounter for therapeutic drug level monitoring: Secondary | ICD-10-CM

## 2014-04-03 DIAGNOSIS — Z23 Encounter for immunization: Secondary | ICD-10-CM

## 2014-04-03 DIAGNOSIS — E1165 Type 2 diabetes mellitus with hyperglycemia: Secondary | ICD-10-CM

## 2014-04-03 DIAGNOSIS — N184 Chronic kidney disease, stage 4 (severe): Secondary | ICD-10-CM

## 2014-04-03 DIAGNOSIS — IMO0002 Reserved for concepts with insufficient information to code with codable children: Secondary | ICD-10-CM

## 2014-04-03 LAB — MDC_IDC_ENUM_SESS_TYPE_INCLINIC
Battery Impedance: 1504 Ohm
Brady Statistic RV Percent Paced: 99 %
Lead Channel Impedance Value: 484 Ohm
Lead Channel Impedance Value: 67 Ohm
Lead Channel Pacing Threshold Amplitude: 1.25 V
Lead Channel Setting Pacing Amplitude: 2.5 V
Lead Channel Setting Pacing Pulse Width: 0.4 ms
Lead Channel Setting Sensing Sensitivity: 2 mV
MDC IDC MSMT BATTERY REMAINING LONGEVITY: 37 mo
MDC IDC MSMT BATTERY VOLTAGE: 2.76 V
MDC IDC MSMT LEADCHNL RV PACING THRESHOLD PULSEWIDTH: 0.4 ms
MDC IDC MSMT LEADCHNL RV SENSING INTR AMPL: 4 mV
MDC IDC SESS DTM: 20151008102829

## 2014-04-03 LAB — CBC WITH DIFFERENTIAL/PLATELET
BASOS PCT: 0.4 % (ref 0.0–3.0)
Basophils Absolute: 0 10*3/uL (ref 0.0–0.1)
EOS ABS: 0.1 10*3/uL (ref 0.0–0.7)
EOS PCT: 1 % (ref 0.0–5.0)
HCT: 37.8 % (ref 36.0–46.0)
HEMOGLOBIN: 12.5 g/dL (ref 12.0–15.0)
LYMPHS PCT: 11.7 % — AB (ref 12.0–46.0)
Lymphs Abs: 1.3 10*3/uL (ref 0.7–4.0)
MCHC: 33 g/dL (ref 30.0–36.0)
MCV: 87.8 fl (ref 78.0–100.0)
MONOS PCT: 6.6 % (ref 3.0–12.0)
Monocytes Absolute: 0.7 10*3/uL (ref 0.1–1.0)
NEUTROS ABS: 8.9 10*3/uL — AB (ref 1.4–7.7)
Neutrophils Relative %: 80.3 % — ABNORMAL HIGH (ref 43.0–77.0)
Platelets: 206 10*3/uL (ref 150.0–400.0)
RBC: 4.3 Mil/uL (ref 3.87–5.11)
RDW: 14.3 % (ref 11.5–15.5)
WBC: 11.1 10*3/uL — AB (ref 4.0–10.5)

## 2014-04-03 LAB — COMPREHENSIVE METABOLIC PANEL
ALT: 15 U/L (ref 0–35)
AST: 24 U/L (ref 0–37)
Albumin: 3.4 g/dL — ABNORMAL LOW (ref 3.5–5.2)
Alkaline Phosphatase: 52 U/L (ref 39–117)
BUN: 50 mg/dL — AB (ref 6–23)
CALCIUM: 9.9 mg/dL (ref 8.4–10.5)
CHLORIDE: 100 meq/L (ref 96–112)
CO2: 26 meq/L (ref 19–32)
Creatinine, Ser: 2.5 mg/dL — ABNORMAL HIGH (ref 0.4–1.2)
GFR: 20.31 mL/min — AB (ref >60.00)
Glucose, Bld: 105 mg/dL — ABNORMAL HIGH (ref 70–99)
Potassium: 4.3 mEq/L (ref 3.5–5.1)
Sodium: 140 mEq/L (ref 135–145)
Total Bilirubin: 0.8 mg/dL (ref 0.2–1.2)
Total Protein: 8.3 g/dL (ref 6.0–8.3)

## 2014-04-03 LAB — LIPID PANEL
Cholesterol: 190 mg/dL (ref 0–200)
HDL: 41 mg/dL (ref >39.00)
LDL Cholesterol: 113 mg/dL — ABNORMAL HIGH (ref 0–99)
NONHDL: 149
Total CHOL/HDL Ratio: 5
Triglycerides: 179 mg/dL — ABNORMAL HIGH (ref 0.0–149.0)
VLDL: 35.8 mg/dL (ref 0.0–40.0)

## 2014-04-03 LAB — HM DIABETES FOOT EXAM: HM Diabetic Foot Exam: NORMAL

## 2014-04-03 LAB — HEMOGLOBIN A1C: Hgb A1c MFr Bld: 7.3 % — ABNORMAL HIGH (ref 4.6–6.5)

## 2014-04-03 LAB — POCT INR: INR: 2.3

## 2014-04-03 NOTE — Progress Notes (Signed)
Pacemaker check in clinic. Normal device function. Threshold, sensing, and impedance consistent with previous measurements. Device programmed to maximize longevity. Permanent AF + Warfarin. No high ventricular rates noted. Device programmed at appropriate safety margins. Histogram distribution appropriate for patient activity level. Device programmed to optimize intrinsic conduction. Estimated longevity 3 years. Patient will follow up with [facility] in 6 months.

## 2014-04-03 NOTE — Progress Notes (Signed)
   Subjective:    Patient ID: Yolanda Huffman, female    DOB: 1941/02/03, 73 y.o.   MRN: [id number]  HPI Patient seen for medical followup. She has history of obesity, type 2 diabetes, CAD, hypertension, atrial fibrillation, chronic kidney disease, osteoporosis. She is followed regularly by nephrology. Her GFR is around 23. No recent edema issues.  Diabetes stable. Last A1c 7.7%. Fasting blood sugars recently ranging around 100-120. No hypoglycemia. Levemir 12 units once daily. No other diabetic medications. She has been opposed ideas of meal time insulin.  Atrial fibrillation on Coumadin. No recent bleeding complications.  Past Medical History  Diagnosis Date  . Diabetes mellitus   . Hyperlipemia   . Hypertension   . CAD (coronary artery disease)     s/p PCI in [place] in 1/09  . Pacemaker   . Atrial fibrillation     prior Sotalol - d/c'd 2/2 increased creatinine; rate control strategy  . Anemia   . PUD (peptic ulcer disease) 10/2010    duodenal ulcer  . CKD (chronic kidney disease)   . Chronic combined systolic and diastolic heart failure     Echocardiogram 3/12: Mild LVH, EF Q000111Q, normal diastolic function, mild AI, mild MR, PASP 44, normal wall motion   Past Surgical History  Procedure Laterality Date  . Pacemaker insertion  07/13/07  . Cataract extraction      01/12/09 and 01/26/09 both eyes  . Stent implant  07/13/2007    reports that she quit smoking about 22 years ago. Her smoking use included Cigarettes. She has a 40 pack-year smoking history. She has never used smokeless tobacco. She reports that she does not drink alcohol or use illicit drugs. family history includes Breast cancer in her maternal aunt and sister; Clotting disorder in her father; Heart attack in her father; Heart disease in her father and mother; Hypertension in her father; Other in her mother. No Known Allergies    Review of Systems  Constitutional: Negative for fatigue.  Eyes: Negative for visual  disturbance.  Respiratory: Negative for cough, chest tightness, shortness of breath and wheezing.   Cardiovascular: Negative for chest pain, palpitations and leg swelling.  Gastrointestinal: Negative for abdominal pain.  Endocrine: Negative for polydipsia and polyuria.  Genitourinary: Negative for dysuria.  Neurological: Negative for dizziness, seizures, syncope, weakness, light-headedness and headaches.       Objective:   Physical Exam  Constitutional: She is oriented to person, place, and time. She appears well-developed and well-nourished.  HENT:  Mouth/Throat: Oropharynx is clear and moist.  Neck: Neck supple. No thyromegaly present.  Cardiovascular: Normal rate and regular rhythm.   Pulmonary/Chest: Effort normal and breath sounds normal. No respiratory distress. She has no wheezes. She has no rales.  Musculoskeletal: She exhibits no edema.  Lymphadenopathy:    She has no cervical adenopathy.  Neurological: She is alert and oriented to person, place, and time.  Skin:  Feet reveal no skin lesions. Good distal foot pulses. Good capillary refill. No calluses. Normal sensation with monofilament testing           Assessment & Plan:  #1 type 2 diabetes. History of fair control. Recheck A1c. Continue regular eye exams. She'll be due next spring #2 chronic kidney disease. Check comprehensive metabolic panel and CBC. Fax results to her nephrologist #3 chronic atrial fibrillation. She has pacemaker and is due for followup tomorrow. Continue Coumadin #4 health maintenance. Flu vaccine given. We've recommended consideration for Prevnar 13 and she wishes to wait

## 2014-04-03 NOTE — Patient Instructions (Signed)
Continue to take 1/2 tablet daily except take 1 tablet on M/W/F.  Re-check in 6 weeks.   Anticoagulation Dose Instructions as of 04/03/2014     Dorene Grebe Tue Wed Thu Fri Sat   New Dose 2.5 mg 5 mg 2.5 mg 5 mg 2.5 mg 5 mg 2.5 mg    Description       Continue to take 1/2 tablet daily except take 1 tablet on M/W/F.  Re-check in 6 weeks.

## 2014-04-03 NOTE — Progress Notes (Signed)
   Subjective:    Patient ID: Yolanda Huffman, female    DOB: [birth year], 73 y.o.   MRN: [id number]  HPI    Review of Systems     Objective:   Physical Exam        Assessment & Plan:

## 2014-04-03 NOTE — Progress Notes (Signed)
Pre visit review using our clinic review tool, if applicable. No additional management support is needed unless otherwise documented below in the visit note. 

## 2014-04-10 ENCOUNTER — Ambulatory Visit: Payer: Medicare PPO

## 2014-04-28 ENCOUNTER — Other Ambulatory Visit: Payer: Self-pay | Admitting: Internal Medicine

## 2014-04-29 ENCOUNTER — Other Ambulatory Visit: Payer: Self-pay

## 2014-04-29 MED ORDER — POTASSIUM CHLORIDE CRYS ER 20 MEQ PO TBCR: EXTENDED_RELEASE_TABLET | ORAL | Status: DC

## 2014-05-01 ENCOUNTER — Other Ambulatory Visit: Payer: Self-pay

## 2014-05-01 MED ORDER — METOLAZONE 2.5 MG PO TABS: ORAL_TABLET | ORAL | Status: DC

## 2014-05-05 ENCOUNTER — Other Ambulatory Visit: Payer: Self-pay

## 2014-05-05 DIAGNOSIS — Z1231 Encounter for screening mammogram for malignant neoplasm of breast: Secondary | ICD-10-CM

## 2014-05-07 ENCOUNTER — Ambulatory Visit (INDEPENDENT_AMBULATORY_CARE_PROVIDER_SITE_OTHER): Payer: Medicare PPO | Admitting: Family

## 2014-05-07 DIAGNOSIS — Z5181 Encounter for therapeutic drug level monitoring: Secondary | ICD-10-CM

## 2014-05-07 LAB — POCT INR: INR: 2.3

## 2014-05-07 NOTE — Patient Instructions (Signed)
Continue to take 1/2 tablet daily except take 1 tablet on M/W/F.  Re-check in 6 weeks.    Anticoagulation Dose Instructions as of 05/07/2014      Dorene Grebe Tue Wed Thu Fri Sat   New Dose 2.5 mg 5 mg 2.5 mg 5 mg 2.5 mg 5 mg 2.5 mg    Description        Continue to take 1/2 tablet daily except take 1 tablet on M/W/F.  Re-check in 6 weeks.

## 2014-05-12 ENCOUNTER — Other Ambulatory Visit: Payer: Self-pay | Admitting: Family Medicine

## 2014-05-12 ENCOUNTER — Telehealth: Payer: Self-pay | Admitting: Family Medicine

## 2014-05-12 MED ORDER — WARFARIN SODIUM 5 MG PO TABS: ORAL_TABLET | ORAL | Status: DC

## 2014-05-12 NOTE — Telephone Encounter (Signed)
Rx sent to pharmacy   

## 2014-05-12 NOTE — Telephone Encounter (Signed)
[facility], [place] is requesting re-fill on warfarin (COUMADIN) 5 MG tablet

## 2014-06-13 ENCOUNTER — Ambulatory Visit
Admission: RE | Admit: 2014-06-13 | Discharge: 2014-06-13 | Disposition: A | Discharge status: Home / Self Care | Payer: Medicare PPO | Source: Ambulatory Visit

## 2014-06-13 DIAGNOSIS — Z1231 Encounter for screening mammogram for malignant neoplasm of breast: Secondary | ICD-10-CM

## 2014-06-18 ENCOUNTER — Ambulatory Visit (INDEPENDENT_AMBULATORY_CARE_PROVIDER_SITE_OTHER): Payer: Medicare PPO | Admitting: Family

## 2014-06-18 DIAGNOSIS — Z5181 Encounter for therapeutic drug level monitoring: Secondary | ICD-10-CM

## 2014-06-18 LAB — POCT INR: INR: 2.6

## 2014-06-18 NOTE — Patient Instructions (Signed)
Continue to take 1/2 tablet daily except take 1 tablet on M/W/F.  Re-check in 6 weeks.   Anticoagulation Dose Instructions as of 06/18/2014      Yolanda Huffman Tue Wed Thu Fri Sat   New Dose 2.5 mg 5 mg 2.5 mg 5 mg 2.5 mg 5 mg 2.5 mg    Description        Continue to take 1/2 tablet daily except take 1 tablet on M/W/F.  Re-check in 6 weeks.

## 2014-06-21 ENCOUNTER — Other Ambulatory Visit: Payer: Self-pay | Admitting: Family Medicine

## 2014-06-23 ENCOUNTER — Telehealth: Payer: Self-pay

## 2014-06-23 MED ORDER — METOPROLOL SUCCINATE ER 25 MG PO TB24: ORAL_TABLET | ORAL | Status: DC

## 2014-06-23 NOTE — Telephone Encounter (Signed)
Refill request for Metoprolol Succinate 25mg  #90 sent to [facility] on [address]

## 2014-06-23 NOTE — Telephone Encounter (Signed)
RX sent to pharmacy  

## 2014-07-07 ENCOUNTER — Other Ambulatory Visit: Payer: Self-pay | Admitting: Internal Medicine

## 2014-07-21 ENCOUNTER — Other Ambulatory Visit: Payer: Self-pay | Admitting: Family Medicine

## 2014-07-22 ENCOUNTER — Other Ambulatory Visit: Payer: Self-pay

## 2014-07-22 MED ORDER — PANTOPRAZOLE SODIUM 40 MG PO TBEC: DELAYED_RELEASE_TABLET | ORAL | Status: DC

## 2014-07-22 MED ORDER — METOPROLOL SUCCINATE ER 25 MG PO TB24: ORAL_TABLET | ORAL | Status: DC

## 2014-07-31 ENCOUNTER — Ambulatory Visit: Payer: Medicare PPO | Admitting: Family

## 2014-08-04 ENCOUNTER — Other Ambulatory Visit: Payer: Self-pay | Admitting: Internal Medicine

## 2014-08-07 ENCOUNTER — Ambulatory Visit: Payer: Medicare PPO | Admitting: Family

## 2014-08-07 ENCOUNTER — Ambulatory Visit (INDEPENDENT_AMBULATORY_CARE_PROVIDER_SITE_OTHER): Payer: Medicare PPO | Admitting: General Practice

## 2014-08-07 DIAGNOSIS — Z5181 Encounter for therapeutic drug level monitoring: Secondary | ICD-10-CM | POA: Diagnosis not present

## 2014-08-07 LAB — POCT INR: INR: 2.1

## 2014-08-07 NOTE — Progress Notes (Signed)
Pre visit review using our clinic review tool, if applicable. No additional management support is needed unless otherwise documented below in the visit note. 

## 2014-08-21 ENCOUNTER — Other Ambulatory Visit: Payer: Self-pay | Admitting: Internal Medicine

## 2014-09-01 ENCOUNTER — Other Ambulatory Visit: Payer: Self-pay | Admitting: Family Medicine

## 2014-09-13 ENCOUNTER — Other Ambulatory Visit: Payer: Self-pay | Admitting: Family Medicine

## 2014-09-15 ENCOUNTER — Other Ambulatory Visit: Payer: Self-pay | Admitting: General Practice

## 2014-09-15 DIAGNOSIS — Z7901 Long term (current) use of anticoagulants: Secondary | ICD-10-CM

## 2014-09-15 MED ORDER — WARFARIN SODIUM 5 MG PO TABS
ORAL_TABLET | ORAL | Status: DC
Start: 2014-09-15 — End: 2015-01-20

## 2014-09-18 ENCOUNTER — Ambulatory Visit: Payer: Medicare PPO

## 2014-09-18 ENCOUNTER — Ambulatory Visit (INDEPENDENT_AMBULATORY_CARE_PROVIDER_SITE_OTHER): Payer: Medicare PPO | Admitting: General Practice

## 2014-09-18 DIAGNOSIS — Z5181 Encounter for therapeutic drug level monitoring: Secondary | ICD-10-CM

## 2014-09-18 LAB — POCT INR: INR: 2.2

## 2014-09-18 NOTE — Progress Notes (Signed)
Pre visit review using our clinic review tool, if applicable. No additional management support is needed unless otherwise documented below in the visit note. 

## 2014-10-14 ENCOUNTER — Ambulatory Visit (INDEPENDENT_AMBULATORY_CARE_PROVIDER_SITE_OTHER): Payer: Medicare PPO | Admitting: Internal Medicine

## 2014-10-14 ENCOUNTER — Encounter: Payer: Self-pay | Admitting: Internal Medicine

## 2014-10-14 VITALS — BP 152/74 | HR 86 | Ht 63.5 in | Wt 162.2 lb

## 2014-10-14 DIAGNOSIS — I10 Essential (primary) hypertension: Secondary | ICD-10-CM | POA: Diagnosis not present

## 2014-10-14 DIAGNOSIS — Z950 Presence of cardiac pacemaker: Secondary | ICD-10-CM

## 2014-10-14 DIAGNOSIS — I482 Chronic atrial fibrillation, unspecified: Secondary | ICD-10-CM

## 2014-10-14 LAB — MDC_IDC_ENUM_SESS_TYPE_INCLINIC
Battery Voltage: 2.76 V
Brady Statistic RV Percent Paced: 99 %
Date Time Interrogation Session: 20160419105204
Lead Channel Impedance Value: 518 Ohm
Lead Channel Impedance Value: 67 Ohm
Lead Channel Pacing Threshold Pulse Width: 0.4 ms
Lead Channel Sensing Intrinsic Amplitude: 4 mV
Lead Channel Setting Pacing Amplitude: 2.5 V
Lead Channel Setting Pacing Pulse Width: 0.4 ms
Lead Channel Setting Sensing Sensitivity: 2 mV
MDC IDC MSMT BATTERY IMPEDANCE: 1857 Ohm
MDC IDC MSMT BATTERY REMAINING LONGEVITY: 33 mo
MDC IDC MSMT LEADCHNL RV PACING THRESHOLD AMPLITUDE: 1.5 V

## 2014-10-14 NOTE — Assessment & Plan Note (Signed)
She now has persistent atrial fibrillation but is minimally symptomatic. She'll continue her current medications. She appears to be tolerating warfarin.

## 2014-10-14 NOTE — Assessment & Plan Note (Signed)
Her blood pressure is slightly elevated today. She notes that at home it is under better control. She is re-encouraged to maintain a low-sodium diet.

## 2014-10-14 NOTE — Assessment & Plan Note (Signed)
Her Medtronic dual-chamber pacemaker is working normally. We'll plan to recheck in several months. 

## 2014-10-14 NOTE — Patient Instructions (Signed)
Medication Instructions:  Your physician recommends that you continue on your current medications as directed. Please refer to the Current Medication list given to you today.   Labwork: NONE  Testing/Procedures: NONE  Follow-Up: Remote monitoring is used to monitor your Pacemaker of ICD from home. This monitoring reduces the number of office visits required to check your device to one time per year. It allows [place] to keep an eye on the functioning of your device to ensure it is working properly. You are scheduled for a device check from home on 01/13/2015. You may send your transmission at any time that day. If you have a wireless device, the transmission will be sent automatically. After your physician reviews your transmission, you will receive a postcard with your next transmission date.  Your physician wants you to follow-up in: 12 months with Dr. Lovena Le. You will receive a reminder letter in the mail two months in advance. If you don't receive a letter, please call our office to schedule the follow-up appointment.   Any Other Special Instructions Will Be Listed Below (If Applicable).  Low-Sodium Eating Plan Sodium raises blood pressure and causes water to be held in the body. Getting less sodium from food will help lower your blood pressure, reduce any swelling, and protect your heart, liver, and kidneys. We get sodium by adding salt (sodium chloride) to food. Most of our sodium comes from canned, boxed, and frozen foods. Restaurant foods, fast foods, and pizza are also very high in sodium. Even if you take medicine to lower your blood pressure or to reduce fluid in your body, getting less sodium from your food is important. WHAT IS MY PLAN? Most people should limit their sodium intake to 2,300 mg a day. Your health care provider recommends that you limit your sodium intake to 2000 mg a day.  WHAT DO I NEED TO KNOW ABOUT THIS EATING PLAN? For the low-sodium eating plan, you will follow these  general guidelines:  Choose foods with a % Daily Value for sodium of less than 5% (as listed on the food label).   Use salt-free seasonings or herbs instead of table salt or sea salt.   Check with your health care provider or pharmacist before using salt substitutes.   Eat fresh foods.  Eat more vegetables and fruits.  Limit canned vegetables. If you do use them, rinse them well to decrease the sodium.   Limit cheese to 1 oz (28 g) per day.   Eat lower-sodium products, often labeled as "lower sodium" or "no salt added."  Avoid foods that contain monosodium glutamate (MSG). MSG is sometimes added to [place] food and some canned foods.  Check food labels (Nutrition Facts labels) on foods to learn how much sodium is in one serving.  Eat more home-cooked food and less restaurant, buffet, and fast food.  When eating at a restaurant, ask that your food be prepared with less salt or none, if possible.  HOW DO I READ FOOD LABELS FOR SODIUM INFORMATION? The Nutrition Facts label lists the amount of sodium in one serving of the food. If you eat more than one serving, you must multiply the listed amount of sodium by the number of servings. Food labels may also identify foods as:  Sodium free--Less than 5 mg in a serving.  Very low sodium--35 mg or less in a serving.  Low sodium--140 mg or less in a serving.  Light in sodium--50% less sodium in a serving. For example, if a food that  usually has 300 mg of sodium is changed to become light in sodium, it will have 150 mg of sodium.  Reduced sodium--25% less sodium in a serving. For example, if a food that usually has 400 mg of sodium is changed to reduced sodium, it will have 300 mg of sodium. WHAT FOODS CAN I EAT? Grains Low-sodium cereals, including oats, puffed wheat and rice, and shredded wheat cereals. Low-sodium crackers. Unsalted rice and pasta. Lower-sodium bread.  Vegetables Frozen or fresh vegetables. Low-sodium or  reduced-sodium canned vegetables. Low-sodium or reduced-sodium tomato sauce and paste. Low-sodium or reduced-sodium tomato and vegetable juices.  Fruits Fresh, frozen, and canned fruit. Fruit juice.  Meat and Other Protein Products Low-sodium canned tuna and salmon. Fresh or frozen meat, poultry, seafood, and fish. Lamb. Unsalted nuts. Dried beans, peas, and lentils without added salt. Unsalted canned beans. Homemade soups without salt. Eggs.  Dairy Milk. Soy milk. Ricotta cheese. Low-sodium or reduced-sodium cheeses. Yogurt.  Condiments Fresh and dried herbs and spices. Salt-free seasonings. Onion and garlic powders. Low-sodium varieties of mustard and ketchup. Lemon juice.  Fats and Oils Reduced-sodium salad dressings. Unsalted butter.  Other Unsalted popcorn and pretzels.  The items listed above may not be a complete list of recommended foods or beverages. Contact your dietitian for more options. WHAT FOODS ARE NOT RECOMMENDED? Grains Instant hot cereals. Bread stuffing, pancake, and biscuit mixes. Croutons. Seasoned rice or pasta mixes. Noodle soup cups. Boxed or frozen macaroni and cheese. Self-rising flour. Regular salted crackers. Vegetables Regular canned vegetables. Regular canned tomato sauce and paste. Regular tomato and vegetable juices. Frozen vegetables in sauces. Salted french fries. Olives. Angie Fava. Relishes. Sauerkraut. Salsa. Meat and Other Protein Products Salted, canned, smoked, spiced, or pickled meats, seafood, or fish. Bacon, ham, sausage, hot dogs, corned beef, chipped beef, and packaged luncheon meats. Salt pork. Jerky. Pickled herring. Anchovies, regular canned tuna, and sardines. Salted nuts. Dairy Processed cheese and cheese spreads. Cheese curds. Blue cheese and cottage cheese. Buttermilk.  Condiments Onion and garlic salt, seasoned salt, table salt, and sea salt. Canned and packaged gravies. Worcestershire sauce. Tartar sauce. Barbecue sauce.  Teriyaki sauce. Soy sauce, including reduced sodium. Steak sauce. Fish sauce. Oyster sauce. Cocktail sauce. Horseradish. Regular ketchup and mustard. Meat flavorings and tenderizers. Bouillon cubes. Hot sauce. Tabasco sauce. Marinades. Taco seasonings. Relishes. Fats and Oils Regular salad dressings. Salted butter. Margarine. Ghee. Bacon fat.  Other Potato and tortilla chips. Corn chips and puffs. Salted popcorn and pretzels. Canned or dried soups. Pizza. Frozen entrees and pot pies.  The items listed above may not be a complete list of foods and beverages to avoid. Contact your dietitian for more information. Document Released: 12/03/2001 Document Revised: 06/18/2013 Document Reviewed: 04/17/2013 [facility] Patient Information 2015 [place], [place]. This information is not intended to replace advice given to you by your health care provider. Make sure you discuss any questions you have with your health care provider.

## 2014-10-14 NOTE — Progress Notes (Signed)
HPI Yolanda Huffman returns today for follow-up. She is a very pleasant 74 year old woman with a history of sinus node dysfunction, and atrial fibrillation. She is status post permanent pacemaker insertion. She has done well in the interim. Her only complaint today is that she has lost her 24 year old cat who died after kidney failure. The patient denies chest pain and shortness of breath. No peripheral edema. She is sedentary. She has not had syncope. She does not experience palpitations. She admits to dietary indiscretion with sodium. Allergies  Allergen Reactions  . Amlodipine Besylate     edema     Current Outpatient Prescriptions  Medication Sig Dispense Refill  . atorvastatin (LIPITOR) 20 MG tablet Take 1 tablet (20 mg total) by mouth daily. 30 tablet 11  . calcitRIOL (ROCALTROL) 0.25 MCG capsule Take 0.25 mcg by mouth only on Mon, Weds, Fri    . COLCRYS 0.6 MG tablet TAKE 1 TABLET (0.6 MG TOTAL) BY MOUTH AS NEEDED AS DIRECTED. (Patient taking differently: TAKE 1 TABLET (0.6 MG TOTAL) BY MOUTH DAILY AS NEEDED AS DIRECTED FOR GOUT) 30 tablet 5  . fish oil-omega-3 fatty acids 1000 MG capsule Take 1 g by mouth 2 (two) times daily.     . furosemide (LASIX) 40 MG tablet TAKE 2 TABLETS IN THE AM AND 1 TABLET IN THE PM (Patient taking differently: TAKE 2 TABLETS BY MOUTH IN THE AM AND 1 TABLET BY MOUTH IN THE PM) 90 tablet 6  . glucose blood test strip 1 each by Other route 2 (two) times daily. Use as instructed     . insulin detemir (LEVEMIR) 100 UNIT/ML injection Inject 12 Units into the skin at bedtime. And on sliding scale    . metolazone (ZAROXOLYN) 2.5 MG tablet TAKE 1 TABLET ON M, W, FRI. ONLY; TAKE THIS WITH YOUR LASIX; YOU WILL ALSO NEED TO TAKE AN EXTRA POTASSIUM ON THESE DAYS AS WELL (Patient taking differently: TAKE 1 TABLET BY MOUTH ON M, W, FRI. ONLY; TAKE THIS WITH YOUR LASIX; YOU WILL ALSO NEED TO TAKE AN EXTRA POTASSIUM ON THESE DAYS AS WELL) 15 tablet 6  . metoprolol  succinate (TOPROL-XL) 25 MG 24 hr tablet TAKE 2 TABLETS BY MOUTH EVERY MORNING.  TAKE 1 TABLET BY MOUTH EVERY EVENING. 90 tablet 2  . Multiple Vitamin (MULTIVITAMIN) capsule Take 1 capsule by mouth daily.    . pantoprazole (PROTONIX) 40 MG tablet TAKE 1 TABLET (40 MG TOTAL) BY MOUTH DAILY. 30 tablet 9  . potassium chloride SA (K-DUR,KLOR-CON) 20 MEQ tablet Take 40 mEq by mouth 2 (two) times daily.    Marland Kitchen warfarin (COUMADIN) 5 MG tablet Take as directed by anticoagulation clinic 30 tablet 3   No current facility-administered medications for this visit.     Past Medical History  Diagnosis Date  . Diabetes mellitus   . Hyperlipemia   . Hypertension   . CAD (coronary artery disease)     s/p PCI in [place] in 1/09  . Pacemaker   . Atrial fibrillation     prior Sotalol - d/c'd 2/2 increased creatinine; rate control strategy  . Anemia   . PUD (peptic ulcer disease) 10/2010    duodenal ulcer  . CKD (chronic kidney disease)   . Chronic combined systolic and diastolic heart failure     Echocardiogram 3/12: Mild LVH, EF Q000111Q, normal diastolic function, mild AI, mild MR, PASP 44, normal wall motion    ROS:   All systems reviewed and negative  except as noted in the HPI.   Past Surgical History  Procedure Laterality Date  . Pacemaker insertion  07/13/07  . Cataract extraction      01/12/09 and 01/26/09 both eyes  . Stent implant  07/13/2007     Family History  Problem Relation Age of Onset  . Heart disease Father   . Heart attack Father   . Clotting disorder Father     blood clot  . Hypertension Father   . Breast cancer Maternal Aunt   . Breast cancer Sister   . Heart disease Mother   . Other Mother     heart condition     History   Social History  . Marital Status: Widowed    Spouse Name: N/A  . Number of Children: N/A  . Years of Education: N/A   Occupational History  . retired [employer]    Social History Main Topics  . Smoking status: Former Smoker -- 2.00 packs/day  for 20 years    Types: Cigarettes    Quit date: 06/28/1991  . Smokeless tobacco: Never Used  . Alcohol Use: No  . Drug Use: No  . Sexual Activity: Not on file   Other Topics Concern  . Not on file   Social History Narrative   Reg exercise           BP 152/74 mmHg  Pulse 86  Ht 5' 3.5" (1.613 m)  Wt 162 lb 3.2 oz (73.573 kg)  BMI 28.28 kg/m2  Physical Exam:  Well appearing 74 year old woman, NAD HEENT: Unremarkable Neck:  6 cm JVD, no thyromegally Back:  No CVA tenderness Lungs:  Clear with no wheezes, rales, or rhonchi. HEART:  Regular rate rhythm, no murmurs, no rubs, no clicks Abd:  soft, positive bowel sounds, no organomegally, no rebound, no guarding Ext:  2 plus pulses, no edema, no cyanosis, no clubbing Skin:  No rashes no nodules Neuro:  CN II through XII intact, motor grossly intact  DEVICE  Normal device function.  See PaceArt for details.   Assess/Plan:

## 2014-10-19 ENCOUNTER — Other Ambulatory Visit: Payer: Self-pay | Admitting: Family Medicine

## 2014-10-19 ENCOUNTER — Other Ambulatory Visit: Payer: Self-pay | Admitting: Internal Medicine

## 2014-10-20 ENCOUNTER — Telehealth: Payer: Self-pay | Admitting: Family Medicine

## 2014-10-20 ENCOUNTER — Other Ambulatory Visit: Payer: Self-pay | Admitting: *Deleted

## 2014-10-20 ENCOUNTER — Encounter: Payer: Self-pay | Admitting: Internal Medicine

## 2014-10-20 MED ORDER — POTASSIUM CHLORIDE CRYS ER 20 MEQ PO TBCR: 40.0000 meq | EXTENDED_RELEASE_TABLET | Freq: Two times a day (BID) | ORAL | Status: DC

## 2014-10-20 NOTE — Telephone Encounter (Signed)
Pt received automated call that she needed an appt w/ an eye md.  Pt states she went last may to Dr Joya San, [facility]. Pt gets reminder call yearly fro [facility] and will make appt when they contact her again.

## 2014-10-30 ENCOUNTER — Ambulatory Visit: Payer: Medicare PPO

## 2014-11-06 ENCOUNTER — Ambulatory Visit (INDEPENDENT_AMBULATORY_CARE_PROVIDER_SITE_OTHER): Payer: Medicare PPO | Admitting: General Practice

## 2014-11-06 DIAGNOSIS — Z5181 Encounter for therapeutic drug level monitoring: Secondary | ICD-10-CM | POA: Diagnosis not present

## 2014-11-06 LAB — POCT INR: INR: 1.9

## 2014-11-06 NOTE — Progress Notes (Signed)
Pre visit review using our clinic review tool, if applicable. No additional management support is needed unless otherwise documented below in the visit note. 

## 2014-11-24 ENCOUNTER — Other Ambulatory Visit: Payer: Self-pay | Admitting: Internal Medicine

## 2014-11-25 ENCOUNTER — Other Ambulatory Visit: Payer: Self-pay

## 2014-11-25 MED ORDER — FUROSEMIDE 40 MG PO TABS: ORAL_TABLET | ORAL | Status: DC

## 2014-12-04 ENCOUNTER — Ambulatory Visit (INDEPENDENT_AMBULATORY_CARE_PROVIDER_SITE_OTHER): Payer: Medicare PPO | Admitting: General Practice

## 2014-12-04 DIAGNOSIS — Z5181 Encounter for therapeutic drug level monitoring: Secondary | ICD-10-CM

## 2014-12-04 LAB — POCT INR: INR: 1.9

## 2014-12-04 NOTE — Progress Notes (Signed)
Pre visit review using our clinic review tool, if applicable. No additional management support is needed unless otherwise documented below in the visit note. 

## 2014-12-22 ENCOUNTER — Other Ambulatory Visit: Payer: Self-pay | Admitting: Family Medicine

## 2014-12-22 ENCOUNTER — Other Ambulatory Visit: Payer: Self-pay | Admitting: *Deleted

## 2014-12-22 MED ORDER — METOPROLOL SUCCINATE ER 25 MG PO TB24: ORAL_TABLET | ORAL | Status: DC

## 2015-01-08 ENCOUNTER — Ambulatory Visit (INDEPENDENT_AMBULATORY_CARE_PROVIDER_SITE_OTHER): Payer: Medicare PPO | Admitting: General Practice

## 2015-01-08 DIAGNOSIS — Z5181 Encounter for therapeutic drug level monitoring: Secondary | ICD-10-CM | POA: Diagnosis not present

## 2015-01-08 LAB — POCT INR: INR: 2.1

## 2015-01-08 NOTE — Progress Notes (Signed)
Pre visit review using our clinic review tool, if applicable. No additional management support is needed unless otherwise documented below in the visit note. 

## 2015-01-13 ENCOUNTER — Telehealth: Payer: Self-pay | Admitting: Cardiology

## 2015-01-13 ENCOUNTER — Telehealth: Payer: Self-pay | Admitting: Internal Medicine

## 2015-01-13 ENCOUNTER — Ambulatory Visit (INDEPENDENT_AMBULATORY_CARE_PROVIDER_SITE_OTHER): Payer: Medicare PPO | Admitting: *Deleted

## 2015-01-13 DIAGNOSIS — I482 Chronic atrial fibrillation, unspecified: Secondary | ICD-10-CM

## 2015-01-13 NOTE — Telephone Encounter (Signed)
Attempted to call pt x2. Number busy and unable to leave a message.

## 2015-01-13 NOTE — Telephone Encounter (Signed)
F/U   Pt returning Ord phone call

## 2015-01-13 NOTE — Progress Notes (Signed)
Remote pacemaker transmission.   

## 2015-01-13 NOTE — Telephone Encounter (Signed)
Attempted to help pt trouble shoot monitor. Transmission successful and pt aware.

## 2015-01-13 NOTE — Telephone Encounter (Signed)
LMOVM reminding pt to send remote transmission.   

## 2015-01-13 NOTE — Telephone Encounter (Signed)
°  1. Has your device fired? No  2. Is you device beeping? No  3. Are you experiencing draining or swelling at device site? No  4. Are you calling to see if we received your device transmission? Yes  5. Have you passed out? No

## 2015-01-19 ENCOUNTER — Other Ambulatory Visit: Payer: Self-pay | Admitting: Family Medicine

## 2015-01-20 ENCOUNTER — Telehealth: Payer: Self-pay | Admitting: *Deleted

## 2015-01-20 ENCOUNTER — Other Ambulatory Visit: Payer: Self-pay | Admitting: General Practice

## 2015-01-20 DIAGNOSIS — Z7901 Long term (current) use of anticoagulants: Secondary | ICD-10-CM

## 2015-01-20 MED ORDER — WARFARIN SODIUM 5 MG PO TABS: ORAL_TABLET | ORAL | Status: DC

## 2015-01-20 NOTE — Telephone Encounter (Signed)
Patient is requesting a refill of warfarin (COUMADIN) 5 MG tablet Yolanda Huffman [address] [phone]

## 2015-01-28 LAB — CUP PACEART REMOTE DEVICE CHECK
Battery Impedance: 2013 Ohm
Date Time Interrogation Session: 20160719171747
Lead Channel Impedance Value: 67 Ohm
Lead Channel Setting Pacing Pulse Width: 0.4 ms
Lead Channel Setting Sensing Sensitivity: 2 mV
MDC IDC MSMT BATTERY REMAINING LONGEVITY: 30 mo
MDC IDC MSMT BATTERY VOLTAGE: 2.75 V
MDC IDC MSMT LEADCHNL RV IMPEDANCE VALUE: 472 Ohm
MDC IDC MSMT LEADCHNL RV PACING THRESHOLD AMPLITUDE: 1.125 V
MDC IDC MSMT LEADCHNL RV PACING THRESHOLD PULSEWIDTH: 0.4 ms
MDC IDC SET LEADCHNL RV PACING AMPLITUDE: 2.5 V
MDC IDC STAT BRADY RV PERCENT PACED: 99 %

## 2015-01-31 ENCOUNTER — Other Ambulatory Visit: Payer: Self-pay | Admitting: Family Medicine

## 2015-02-03 ENCOUNTER — Other Ambulatory Visit: Payer: Self-pay | Admitting: General Practice

## 2015-02-03 DIAGNOSIS — Z7901 Long term (current) use of anticoagulants: Secondary | ICD-10-CM

## 2015-02-03 MED ORDER — WARFARIN SODIUM 5 MG PO TABS: ORAL_TABLET | ORAL | Status: DC

## 2015-02-05 ENCOUNTER — Ambulatory Visit (INDEPENDENT_AMBULATORY_CARE_PROVIDER_SITE_OTHER): Payer: Medicare PPO | Admitting: General Practice

## 2015-02-05 DIAGNOSIS — Z5181 Encounter for therapeutic drug level monitoring: Secondary | ICD-10-CM

## 2015-02-05 LAB — POCT INR: INR: 2.8

## 2015-02-05 NOTE — Progress Notes (Signed)
Pre visit review using our clinic review tool, if applicable. No additional management support is needed unless otherwise documented below in the visit note. 

## 2015-02-10 DIAGNOSIS — N183 Chronic kidney disease, stage 3 (moderate): Secondary | ICD-10-CM | POA: Diagnosis not present

## 2015-02-10 DIAGNOSIS — N2581 Secondary hyperparathyroidism of renal origin: Secondary | ICD-10-CM | POA: Diagnosis not present

## 2015-02-10 DIAGNOSIS — N184 Chronic kidney disease, stage 4 (severe): Secondary | ICD-10-CM | POA: Diagnosis not present

## 2015-02-19 ENCOUNTER — Other Ambulatory Visit: Payer: Self-pay | Admitting: Family Medicine

## 2015-02-26 ENCOUNTER — Encounter: Payer: Self-pay | Admitting: Cardiology

## 2015-02-26 ENCOUNTER — Encounter: Payer: Self-pay | Admitting: Gastroenterology

## 2015-03-02 ENCOUNTER — Other Ambulatory Visit: Payer: Self-pay | Admitting: Family Medicine

## 2015-03-05 ENCOUNTER — Ambulatory Visit (INDEPENDENT_AMBULATORY_CARE_PROVIDER_SITE_OTHER): Payer: Medicare PPO | Admitting: General Practice

## 2015-03-05 DIAGNOSIS — Z5181 Encounter for therapeutic drug level monitoring: Secondary | ICD-10-CM

## 2015-03-05 LAB — POCT INR: INR: 2.7

## 2015-03-05 NOTE — Progress Notes (Signed)
Pre visit review using our clinic review tool, if applicable. No additional management support is needed unless otherwise documented below in the visit note. 

## 2015-03-06 ENCOUNTER — Encounter: Payer: Self-pay | Admitting: Gastroenterology

## 2015-03-11 ENCOUNTER — Encounter: Payer: Self-pay | Admitting: Internal Medicine

## 2015-03-18 ENCOUNTER — Encounter: Payer: Self-pay | Admitting: Gastroenterology

## 2015-03-25 ENCOUNTER — Ambulatory Visit: Payer: Medicare PPO | Admitting: Gastroenterology

## 2015-04-07 ENCOUNTER — Other Ambulatory Visit: Payer: Self-pay | Admitting: Family Medicine

## 2015-04-08 ENCOUNTER — Other Ambulatory Visit: Payer: Self-pay | Admitting: Family Medicine

## 2015-04-08 MED ORDER — ATORVASTATIN CALCIUM 20 MG PO TABS: 20.0000 mg | ORAL_TABLET | Freq: Every day | ORAL | Status: DC

## 2015-04-09 ENCOUNTER — Encounter: Payer: Self-pay | Admitting: Gastroenterology

## 2015-04-15 ENCOUNTER — Encounter: Payer: Self-pay | Admitting: Gastroenterology

## 2015-04-15 ENCOUNTER — Ambulatory Visit (INDEPENDENT_AMBULATORY_CARE_PROVIDER_SITE_OTHER): Payer: Medicare PPO | Admitting: Gastroenterology

## 2015-04-15 ENCOUNTER — Telehealth: Payer: Self-pay | Admitting: *Deleted

## 2015-04-15 VITALS — BP 118/70 | HR 80 | Ht 63.5 in | Wt 163.4 lb

## 2015-04-15 DIAGNOSIS — Z860100 Personal history of colon polyps, unspecified: Secondary | ICD-10-CM | POA: Insufficient documentation

## 2015-04-15 DIAGNOSIS — Z794 Long term (current) use of insulin: Secondary | ICD-10-CM

## 2015-04-15 DIAGNOSIS — Z7901 Long term (current) use of anticoagulants: Secondary | ICD-10-CM | POA: Diagnosis not present

## 2015-04-15 DIAGNOSIS — I482 Chronic atrial fibrillation: Secondary | ICD-10-CM

## 2015-04-15 DIAGNOSIS — I4821 Permanent atrial fibrillation: Secondary | ICD-10-CM

## 2015-04-15 DIAGNOSIS — I10 Essential (primary) hypertension: Secondary | ICD-10-CM

## 2015-04-15 DIAGNOSIS — E119 Type 2 diabetes mellitus without complications: Secondary | ICD-10-CM

## 2015-04-15 DIAGNOSIS — IMO0001 Reserved for inherently not codable concepts without codable children: Secondary | ICD-10-CM

## 2015-04-15 MED ORDER — NA SULFATE-K SULFATE-MG SULF 17.5-3.13-1.6 GM/177ML PO SOLN: 1.0000 | Freq: Once | ORAL | Status: AC

## 2015-04-15 NOTE — Progress Notes (Signed)
04/15/2015 Yolanda Huffman [id number] 1941/04/16   HISTORY OF PRESENT ILLNESS:  This is a pleasant 74 year old female who is previously known to Dr. Deatra Ina for colonoscopy in 04/2010 at which time she was found to have one colon polyp that was removed from the descending colon and was hyperplastic on pathology, but it was very large at 20 mm.  Repeat colonoscopy was therefore recommended in 5 years from that time.  Also had severe diverticulosis.  She is here today to schedule repeat colonoscopy.  She is on coumadin since about 2009 for atrial fibrillation, permanent, and pacemaker, but seems to be stable from cardiac standpoint.  She follows with Dr. Lovena Le for cardiology.  She is also insulin dependent diabetic.  Past Medical History  Diagnosis Date  . Diabetes mellitus   . Hyperlipemia   . Hypertension   . CAD (coronary artery disease)     s/p PCI in [place] in 1/09  . Pacemaker   . Atrial fibrillation ([place])     prior Sotalol - d/c'd 2/2 increased creatinine; rate control strategy  . Anemia   . PUD (peptic ulcer disease) 10/2010    duodenal ulcer  . CKD (chronic kidney disease)   . Chronic combined systolic and diastolic heart failure (HCC)     Echocardiogram 3/12: Mild LVH, EF Q000111Q, normal diastolic function, mild AI, mild MR, PASP 44, normal wall motion  . Osteoporosis   . Anemia   . Pneumonia   . Pleural effusion    Past Surgical History  Procedure Laterality Date  . Pacemaker insertion  07/13/07  . Cataract extraction Bilateral     01/12/09 and 01/26/09 both eyes  . Stent implant  07/13/2007    reports that she quit smoking about 23 years ago. Her smoking use included Cigarettes. She has a 40 pack-year smoking history. She has never used smokeless tobacco. She reports that she does not drink alcohol or use illicit drugs. family history includes Breast cancer in her maternal aunt and sister; Clotting disorder in her father; Heart attack in her father; Heart disease in her  father and mother; Hypertension in her father. Allergies  Allergen Reactions  . Amlodipine Besylate     edema      Outpatient Encounter Prescriptions as of 04/15/2015  Medication Sig  . atorvastatin (LIPITOR) 20 MG tablet Take 1 tablet (20 mg total) by mouth daily.  . calcitRIOL (ROCALTROL) 0.25 MCG capsule Take 0.25 mcg by mouth only on Mon, Weds, Fri  . COLCRYS 0.6 MG tablet TAKE 1 TABLET (0.6 MG TOTAL) BY MOUTH AS NEEDED AS DIRECTED. (Patient taking differently: TAKE 1 TABLET (0.6 MG TOTAL) BY MOUTH DAILY AS NEEDED AS DIRECTED FOR GOUT)  . Cranberry 1000 MG CAPS Take 1 capsule by mouth daily.  . fish oil-omega-3 fatty acids 1000 MG capsule Take 1 g by mouth 2 (two) times daily.   . furosemide (LASIX) 40 MG tablet TAKE 2 TABLETS BY MOUTH IN THE AM AND 1 TABLET BY MOUTH IN THE PM  . glucose blood test strip 1 each by Other route 2 (two) times daily. Use as instructed   . insulin detemir (LEVEMIR) 100 UNIT/ML injection Inject 12 Units into the skin at bedtime. And on sliding scale  . LEVEMIR FLEXTOUCH 100 UNIT/ML Pen INJECT 5 UNITS INTO THE SKIN AT BEDTIME AS DIRECTED.  Marland Kitchen metolazone (ZAROXOLYN) 2.5 MG tablet TAKE 1 TABLET ON M, W, FRI. ONLY; TAKE THIS WITH YOUR LASIX; YOU WILL ALSO NEED  TO TAKE AN EXTRA POTASSIUM ON THESE DAYS AS WELL (Patient taking differently: TAKE 1 TABLET BY MOUTH ON M, W, FRI. ONLY; TAKE THIS WITH YOUR LASIX; YOU WILL ALSO NEED TO TAKE AN EXTRA POTASSIUM ON THESE DAYS AS WELL)  . metoprolol succinate (TOPROL-XL) 25 MG 24 hr tablet TAKE 2 TABLETS BY MOUTH EVERY MORNING.  TAKE 1 TABLET BY MOUTH EVERY EVENING.  . Multiple Vitamin (MULTIVITAMIN) capsule Take 1 capsule by mouth daily.  . pantoprazole (PROTONIX) 40 MG tablet TAKE 1 TABLET (40 MG TOTAL) BY MOUTH DAILY.  Marland Kitchen potassium chloride SA (K-DUR,KLOR-CON) 20 MEQ tablet Take 2 tablets (40 mEq total) by mouth 2 (two) times daily.  Marland Kitchen warfarin (COUMADIN) 5 MG tablet Take as directed by anticoagulation clinic  .  [DISCONTINUED] atorvastatin (LIPITOR) 20 MG tablet TAKE 1 TABLET (20 MG TOTAL) BY MOUTH DAILY  . [DISCONTINUED] furosemide (LASIX) 40 MG tablet TAKE 2 TABLETS BY MOUTH IN THE MORNING  AND 1 TABLET IN THE EVENING  . [DISCONTINUED] metoprolol succinate (TOPROL-XL) 25 MG 24 hr tablet TAKE 2 TABLETS BY MOUTH EVERY MORNING.  TAKE 1 TABLET BY MOUTH EVERY EVENING.  . [DISCONTINUED] Potassium Chloride ER 20 MEQ TBCR TAKE 4 TABLETS BY MOUTH DAILY WITH FOOD AND FULL GLASS OF WATER AS DIRECTED.  **NEED TO SCHEDULE APPOINTMENT FOR FUTURE REFILLS**   No facility-administered encounter medications on file as of 04/15/2015.     REVIEW OF SYSTEMS  : All other systems reviewed and negative except where noted in the History of Present Illness.   PHYSICAL EXAM: BP 118/70 mmHg  Pulse 80  Ht 5' 3.5" (1.613 m)  Wt 163 lb 6.4 oz (74.118 kg)  BMI 28.49 kg/m2 General: Well developed white female in no acute distress Head: Normocephalic and atraumatic Eyes:  Sclerae anicteric, conjunctiva pink. Ears: Normal auditory acuity Lungs: Clear throughout to auscultation Heart: Regular rate and rhythm Abdomen: Soft, non-distended.  Normal bowel sounds.  Non-tender. Rectal:  Will be done at the time of colonoscopy. Musculoskeletal: Symmetrical with no gross deformities  Skin: No lesions on visible extremities Extremities: No edema  Neurological: Alert oriented x 4, grossly non-focal Psychological:  Alert and cooperative. Normal mood and affect  ASSESSMENT AND PLAN: -Personal history of colon polyps:  Large 20 mm hyperplastic polyp in 04/2010. Will schedule for colonoscopy with Dr. Silverio Decamp. -Permanent atrial fibrillation on chronic anticoagulation:  Will hold coumadin for 5 days prior to endoscopic procedures - will instruct when and how to resume after procedure. Benefits and risks of procedure explained including risks of bleeding, perforation, infection, missed lesions, reactions to medications and possible need  for hospitalization and surgery for complications. Additional rare but real risk of stroke or other vascular clotting events off of coumadin also explained and need to seek urgent help if any signs of these problems occur. Will communicate by phone or EMR with patient's  prescribing provider, Dr. Lovena Le, to confirm that holding coumadin is reasonable in this case.  -IDDM:  Insulin will be adjusted prior to endoscopic procedure per protocol. Will resume normal dosing after procedure.  CC:  Eulas Post, MD

## 2015-04-15 NOTE — Patient Instructions (Addendum)
You have been scheduled for a colonoscopy. Please follow written instructions given to you at your visit today.  Please pick up your prep supplies at the pharmacy within the next 1-3 days. Yolanda Huffman, [employer] . If you use inhalers (even only as needed), please bring them with you on the day of your procedure. Your physician has requested that you go to www.startemmi.com and enter the access code given to you at your visit today. This web site gives a general overview about your procedure. However, you should still follow specific instructions given to you by our office regarding your preparation for the procedure.

## 2015-04-15 NOTE — Telephone Encounter (Signed)
04/15/2015   RE: JOHNNIE BARTOLOTTA DOB: 02/03/1941 MRN: [id number]   Dear Dr. Cristopher Peru,   We have scheduled the above patient for an endoscopic procedure. Our records show that she is on anticoagulation therapy.   Please advise as to how long the patient may come off her therapy of Coumadin prior to the procedure, which is scheduled for 06-08-2015.  Please fax back/ or route the completed form to [place] at [phone].   Sincerely,    Alonza Bogus PA-C

## 2015-04-16 ENCOUNTER — Other Ambulatory Visit: Payer: Self-pay | Admitting: Family Medicine

## 2015-04-16 ENCOUNTER — Ambulatory Visit (INDEPENDENT_AMBULATORY_CARE_PROVIDER_SITE_OTHER): Payer: Medicare PPO | Admitting: General Practice

## 2015-04-16 DIAGNOSIS — Z5181 Encounter for therapeutic drug level monitoring: Secondary | ICD-10-CM | POA: Diagnosis not present

## 2015-04-16 LAB — POCT INR: INR: 2.8

## 2015-04-16 NOTE — Progress Notes (Signed)
Pre visit review using our clinic review tool, if applicable. No additional management support is needed unless otherwise documented below in the visit note. 

## 2015-04-16 NOTE — Progress Notes (Signed)
Agree with coumadin management.

## 2015-04-20 ENCOUNTER — Ambulatory Visit (INDEPENDENT_AMBULATORY_CARE_PROVIDER_SITE_OTHER): Payer: Medicare PPO | Admitting: *Deleted

## 2015-04-20 ENCOUNTER — Telehealth: Payer: Self-pay | Admitting: Internal Medicine

## 2015-04-20 ENCOUNTER — Telehealth: Payer: Self-pay | Admitting: Cardiology

## 2015-04-20 DIAGNOSIS — I482 Chronic atrial fibrillation, unspecified: Secondary | ICD-10-CM

## 2015-04-20 NOTE — Telephone Encounter (Signed)
LMOVM reminding pt to send remote transmission.   

## 2015-04-20 NOTE — Telephone Encounter (Signed)
Patient may hold coumadin for surgery 5 days prior to the procedure and restart 3 days after the procedure. GT

## 2015-04-20 NOTE — Telephone Encounter (Signed)
Noted in appt notes

## 2015-04-20 NOTE — Telephone Encounter (Signed)
New message      Calling to let device clinic know that she will be checking her remote transmission on Tuesday the 25th instead of Monday the 24th.

## 2015-04-21 ENCOUNTER — Encounter: Payer: Self-pay | Admitting: Cardiology

## 2015-04-21 DIAGNOSIS — I482 Chronic atrial fibrillation: Secondary | ICD-10-CM

## 2015-04-21 NOTE — Telephone Encounter (Signed)
Called the patient to advise her of the coumadin instructions per Dr. Cristopher Peru. She may hold it 5 days prior to 06-25-2015 and restart it 3 days after the procedure. I advised her that Dr. Silverio Decamp will tell her when to start it again after her procedure.

## 2015-04-22 NOTE — Progress Notes (Signed)
Remote pacemaker transmission.   

## 2015-04-22 NOTE — Progress Notes (Signed)
Reviewed and agree with documentation and assessment and plan. K. Veena Nandigam , MD   

## 2015-04-24 LAB — CUP PACEART REMOTE DEVICE CHECK
Date Time Interrogation Session: 20161025133216
Implantable Lead Implant Date: 20090116
Implantable Lead Location: 753859
Implantable Lead Location: 753860
Implantable Lead Model: 5076
Lead Channel Impedance Value: 490 Ohm
Lead Channel Impedance Value: 67 Ohm
Lead Channel Pacing Threshold Amplitude: 1.125 V
Lead Channel Pacing Threshold Pulse Width: 0.4 ms
MDC IDC LEAD IMPLANT DT: 20090116
MDC IDC MSMT BATTERY IMPEDANCE: 2166 Ohm
MDC IDC MSMT BATTERY REMAINING LONGEVITY: 28 mo
MDC IDC MSMT BATTERY VOLTAGE: 2.75 V
MDC IDC SET LEADCHNL RV PACING AMPLITUDE: 2.5 V
MDC IDC SET LEADCHNL RV PACING PULSEWIDTH: 0.4 ms
MDC IDC SET LEADCHNL RV SENSING SENSITIVITY: 2 mV
MDC IDC STAT BRADY RV PERCENT PACED: 99 %

## 2015-04-27 ENCOUNTER — Other Ambulatory Visit: Payer: Self-pay | Admitting: Family Medicine

## 2015-04-29 ENCOUNTER — Encounter: Payer: Self-pay | Admitting: Cardiology

## 2015-05-01 ENCOUNTER — Other Ambulatory Visit: Payer: Self-pay | Admitting: Family Medicine

## 2015-05-01 DIAGNOSIS — N183 Chronic kidney disease, stage 3 (moderate): Secondary | ICD-10-CM | POA: Diagnosis not present

## 2015-05-01 DIAGNOSIS — N184 Chronic kidney disease, stage 4 (severe): Secondary | ICD-10-CM | POA: Diagnosis not present

## 2015-05-01 DIAGNOSIS — N2581 Secondary hyperparathyroidism of renal origin: Secondary | ICD-10-CM | POA: Diagnosis not present

## 2015-05-07 DIAGNOSIS — E119 Type 2 diabetes mellitus without complications: Secondary | ICD-10-CM | POA: Diagnosis not present

## 2015-05-07 DIAGNOSIS — R801 Persistent proteinuria, unspecified: Secondary | ICD-10-CM | POA: Diagnosis not present

## 2015-05-07 DIAGNOSIS — D631 Anemia in chronic kidney disease: Secondary | ICD-10-CM | POA: Diagnosis not present

## 2015-05-07 DIAGNOSIS — N2581 Secondary hyperparathyroidism of renal origin: Secondary | ICD-10-CM | POA: Diagnosis not present

## 2015-05-07 DIAGNOSIS — I10 Essential (primary) hypertension: Secondary | ICD-10-CM | POA: Diagnosis not present

## 2015-05-07 DIAGNOSIS — N184 Chronic kidney disease, stage 4 (severe): Secondary | ICD-10-CM | POA: Diagnosis not present

## 2015-05-07 DIAGNOSIS — I509 Heart failure, unspecified: Secondary | ICD-10-CM | POA: Diagnosis not present

## 2015-05-12 ENCOUNTER — Other Ambulatory Visit: Payer: Self-pay | Admitting: Family Medicine

## 2015-05-12 ENCOUNTER — Other Ambulatory Visit: Payer: Self-pay

## 2015-05-12 DIAGNOSIS — Z1231 Encounter for screening mammogram for malignant neoplasm of breast: Secondary | ICD-10-CM

## 2015-05-22 ENCOUNTER — Other Ambulatory Visit: Payer: Self-pay | Admitting: Family Medicine

## 2015-05-28 ENCOUNTER — Ambulatory Visit (INDEPENDENT_AMBULATORY_CARE_PROVIDER_SITE_OTHER): Payer: Medicare PPO | Admitting: General Practice

## 2015-05-28 DIAGNOSIS — Z5181 Encounter for therapeutic drug level monitoring: Secondary | ICD-10-CM

## 2015-05-28 LAB — POCT INR: INR: 3

## 2015-05-28 NOTE — Progress Notes (Signed)
Agree with coumadin management

## 2015-05-28 NOTE — Progress Notes (Signed)
Pre visit review using our clinic review tool, if applicable. No additional management support is needed unless otherwise documented below in the visit note. 

## 2015-05-28 NOTE — Patient Instructions (Addendum)
12/23 - Take last dose of coumadin until after procedure.  12/30 - Take 7.5 mg coumadin ( 1/1/2 tablets) 12.31 - Take 7.5 mg coumadin ( 1 1/2 tablets) 1/1 - Continue coumadin as prescribed.

## 2015-06-01 ENCOUNTER — Other Ambulatory Visit: Payer: Self-pay | Admitting: Internal Medicine

## 2015-06-01 NOTE — Telephone Encounter (Signed)
Evans Lance, MD at 10/14/2014 10:39 AM  furosemide (LASIX) 40 MG tabletTAKE 2 TABLETS IN THE AM AND 1 TABLET IN THE PM Patient Instructions     Medication Instructions:  Your physician recommends that you continue on your current medications as directed. Please refer to the Current Medication list given to you today.

## 2015-06-08 ENCOUNTER — Encounter: Payer: Medicare PPO | Admitting: Gastroenterology

## 2015-06-12 ENCOUNTER — Encounter: Payer: Self-pay | Admitting: Gastroenterology

## 2015-06-25 ENCOUNTER — Ambulatory Visit (AMBULATORY_SURGERY_CENTER): Payer: Medicare PPO | Admitting: Gastroenterology

## 2015-06-25 ENCOUNTER — Encounter: Payer: Self-pay | Admitting: Gastroenterology

## 2015-06-25 VITALS — BP 143/63 | HR 70 | Temp 97.4°F | Resp 15 | Ht 63.0 in | Wt 163.0 lb

## 2015-06-25 DIAGNOSIS — E119 Type 2 diabetes mellitus without complications: Secondary | ICD-10-CM | POA: Diagnosis not present

## 2015-06-25 DIAGNOSIS — Z5309 Procedure and treatment not carried out because of other contraindication: Secondary | ICD-10-CM | POA: Diagnosis not present

## 2015-06-25 DIAGNOSIS — Z86010 Personal history of colonic polyps: Secondary | ICD-10-CM | POA: Diagnosis not present

## 2015-06-25 DIAGNOSIS — I4891 Unspecified atrial fibrillation: Secondary | ICD-10-CM | POA: Diagnosis not present

## 2015-06-25 DIAGNOSIS — K5730 Diverticulosis of large intestine without perforation or abscess without bleeding: Secondary | ICD-10-CM | POA: Diagnosis not present

## 2015-06-25 MED ORDER — SODIUM CHLORIDE 0.9 % IV SOLN: 500.0000 mL | INTRAVENOUS | Status: DC

## 2015-06-25 NOTE — Op Note (Signed)
[place]  [address] [place], [address]   COLONOSCOPY PROCEDURE REPORT  PATIENT: Yolanda, Huffman  MR#: [id number] BIRTHDATE: 07/14/1940 , 43  yrs. old GENDER: female ENDOSCOPIST: Harl Bowie, MD REFERRED [id number] Elease Hashimoto, M.D. PROCEDURE DATE:  06/25/2015 PROCEDURE:   Colonoscopy, screening First Screening Colonoscopy - Avg.  risk and is 50 yrs.  old or older - No.  Prior Negative Screening - Now for repeat screening. N/A  History of Adenoma - Now for follow-up colonoscopy & has been > or = to 3 yrs.  N/A  Recommend repeat exam, <10 yrs? Yes poor prep ASA CLASS:   Class III INDICATIONS:Screening for colonic neoplasia and Colorectal Neoplasm Risk Assessment for this procedure is average risk. MEDICATIONS: Propofol 100 mg IV  DESCRIPTION OF PROCEDURE:   After the risks benefits and alternatives of the procedure were thoroughly explained, informed consent was obtained.  The digital rectal exam revealed no abnormalities of the rectum.   The LB [id number]  endoscope was introduced through the anus and advanced to the sigmoid colon. No adverse events experienced.   The quality of the prep was poor. The instrument was then slowly withdrawn as the colon was fully examined. Estimated blood loss is zero unless otherwise noted in this procedure report.   COLON FINDINGS: Poor prep with solid stool in the sigmoid colon, appears sigmoid diverticulosis could not pass around the stool and and identify the lumen.  Procedure was aborted.  Retroflexion was not performed. The time to cecum = 0.6 Withdrawal time = 5.3   The scope was withdrawn and the procedure completed. COMPLICATIONS: There were no immediate complications.  ENDOSCOPIC IMPRESSION: Poor prep with solid stool in the sigmoid colon, appears sigmoid diverticulosis could not pass around the stool and and identify the lumen.  Procedure was aborted  RECOMMENDATIONS: Repeat colonoscopy with extended  2 day bowel prep, please advise patient to drink adequate fluids in addition to the bowel prep okay to restart Coumadin today  eSigned:  Harl Bowie, MD 06/25/2015 11:40 AM

## 2015-06-25 NOTE — Progress Notes (Signed)
Patient awakening,vss,report to rn 

## 2015-06-25 NOTE — Patient Instructions (Signed)
YOU HAD AN ENDOSCOPIC PROCEDURE TODAY AT [facility]:   Refer to the procedure report that was given to you for any specific questions about what was found during the examination.  If the procedure report does not answer your questions, please call your gastroenterologist to clarify.  If you requested that your care partner not be given the details of your procedure findings, then the procedure report has been included in a sealed envelope for you to review at your convenience later.  YOU SHOULD EXPECT: Some feelings of bloating in the abdomen. Passage of more gas than usual.  Walking can help get rid of the air that was put into your GI tract during the procedure and reduce the bloating. If you had a lower endoscopy (such as a colonoscopy or flexible sigmoidoscopy) you may notice spotting of blood in your stool or on the toilet paper. If you underwent a bowel prep for your procedure, you may not have a normal bowel movement for a few days.  Please Note:  You might notice some irritation and congestion in your nose or some drainage.  This is from the oxygen used during your procedure.  There is no need for concern and it should clear up in a day or so.  SYMPTOMS TO REPORT IMMEDIATELY:   Following lower endoscopy (colonoscopy or flexible sigmoidoscopy):  Excessive amounts of blood in the stool  Significant tenderness or worsening of abdominal pains  Swelling of the abdomen that is new, acute  Fever of 100F or higher  For urgent or emergent issues, a gastroenterologist can be reached at any hour by calling [phone].   DIET: Your first meal following the procedure should be a small meal and then it is ok to progress to your normal diet. Heavy or fried foods are harder to digest and may make you feel nauseous or bloated.  Likewise, meals heavy in dairy and vegetables can increase bloating.  Drink plenty of fluids but you should avoid alcoholic beverages for 24  hours.  ACTIVITY:  You should plan to take it easy for the rest of today and you should NOT DRIVE or use heavy machinery until tomorrow (because of the sedation medicines used during the test).    FOLLOW UP: Our staff will call the number listed on your records the next business day following your procedure to check on you and address any questions or concerns that you may have regarding the information given to you following your procedure. If we do not reach you, we will leave a message.  However, if you are feeling well and you are not experiencing any problems, there is no need to return our call.  We will assume that you have returned to your regular daily activities without incident.  If any biopsies were taken you will be contacted by phone or by letter within the next 1-3 weeks.  Please call us at [phone] if you have not heard about the biopsies in 3 weeks.    SIGNATURES/CONFIDENTIALITY: You and/or your care partner have signed paperwork which will be entered into your electronic medical record.  These signatures attest to the fact that that the information above on your After Visit Summary has been reviewed and is understood.  Full responsibility of the confidentiality of this discharge information lies with you and/or your care-partner.  Your blood sugar was 115 in the recovery room. Handouts were given to your care partner on Diverticulosis. You may resume your current medications today.  Resume your coumadin today. See appointments at the top of discharge instructions for pre-visit and colonoscopy. Please call if any questions or concerns.

## 2015-06-25 NOTE — Progress Notes (Signed)
No problems noted in the recovery room. maw 

## 2015-06-26 ENCOUNTER — Telehealth: Payer: Self-pay | Admitting: *Deleted

## 2015-06-26 NOTE — Telephone Encounter (Signed)
  Follow up Call-  Call back number 06/25/2015  Post procedure Call Back phone  # [phone]  Permission to leave phone message Yes     Patient questions:  Do you have a fever, pain , or abdominal swelling? No. Pain Score  0 *  Have you tolerated food without any problems? Yes.    Have you been able to return to your normal activities? Yes.    Do you have any questions about your discharge instructions: Diet   No. Medications  No. Follow up visit  No.  Do you have questions or concerns about your Care? No.  Actions: * If pain score is 4 or above: No action needed, pain <4.

## 2015-06-26 NOTE — Progress Notes (Signed)
When rescheduling pt for colonoscopy, I clarified if we can use the Coumadin order given per Dr. Lovena Le on 04-15-15.  She states that we can.  I made a note of this on her PV appointment schedule

## 2015-06-30 ENCOUNTER — Other Ambulatory Visit: Payer: Self-pay | Admitting: Internal Medicine

## 2015-06-30 ENCOUNTER — Other Ambulatory Visit: Payer: Self-pay | Admitting: Family Medicine

## 2015-07-02 ENCOUNTER — Ambulatory Visit
Admission: RE | Admit: 2015-07-02 | Discharge: 2015-07-02 | Disposition: A | Discharge status: Home / Self Care | Payer: Medicare Other | Source: Ambulatory Visit

## 2015-07-02 DIAGNOSIS — Z1231 Encounter for screening mammogram for malignant neoplasm of breast: Secondary | ICD-10-CM

## 2015-07-09 ENCOUNTER — Ambulatory Visit (INDEPENDENT_AMBULATORY_CARE_PROVIDER_SITE_OTHER): Payer: Medicare Other | Admitting: General Practice

## 2015-07-09 DIAGNOSIS — Z5181 Encounter for therapeutic drug level monitoring: Secondary | ICD-10-CM

## 2015-07-09 LAB — POCT INR: INR: 2.2

## 2015-07-09 NOTE — Progress Notes (Signed)
Pre visit review using our clinic review tool, if applicable. No additional management support is needed unless otherwise documented below in the visit note. 

## 2015-07-14 ENCOUNTER — Other Ambulatory Visit: Payer: Self-pay | Admitting: Family Medicine

## 2015-07-16 ENCOUNTER — Ambulatory Visit: Payer: Medicare Other

## 2015-07-16 VITALS — Ht 63.5 in | Wt 160.0 lb

## 2015-07-16 DIAGNOSIS — Z860100 Personal history of colon polyps, unspecified: Secondary | ICD-10-CM

## 2015-07-16 MED ORDER — SUPREP BOWEL PREP KIT 17.5-3.13-1.6 GM/177ML PO SOLN: 1.0000 | Freq: Once | ORAL | Status: DC

## 2015-07-16 NOTE — Progress Notes (Signed)
No allergies to eggs or soy No diet/weight loss meds No home oxygen No past problems with anesthesia  No internet

## 2015-07-21 ENCOUNTER — Ambulatory Visit (INDEPENDENT_AMBULATORY_CARE_PROVIDER_SITE_OTHER): Payer: Medicare Other | Admitting: *Deleted

## 2015-07-21 DIAGNOSIS — I482 Chronic atrial fibrillation, unspecified: Secondary | ICD-10-CM

## 2015-07-22 NOTE — Progress Notes (Signed)
Remote pacemaker transmission.   

## 2015-07-30 ENCOUNTER — Ambulatory Visit (AMBULATORY_SURGERY_CENTER): Payer: Medicare Other | Admitting: Gastroenterology

## 2015-07-30 ENCOUNTER — Encounter: Payer: Self-pay | Admitting: Gastroenterology

## 2015-07-30 VITALS — BP 122/61 | HR 69 | Temp 99.0°F | Resp 20 | Ht 63.0 in | Wt 160.0 lb

## 2015-07-30 DIAGNOSIS — Z86010 Personal history of colonic polyps: Secondary | ICD-10-CM | POA: Diagnosis not present

## 2015-07-30 MED ORDER — SODIUM CHLORIDE 0.9 % IV SOLN: 500.0000 mL | INTRAVENOUS | Status: DC

## 2015-07-30 NOTE — Op Note (Addendum)
[place]  [address] [place], [id number]   COLONOSCOPY PROCEDURE REPORT  PATIENT: Yolanda Huffman, Yolanda Huffman  MR#: [id number] BIRTHDATE: [birth year] , 75  yrs. old GENDER: female ENDOSCOPIST: Harl Bowie, MD REFERRED [id number] Elease Hashimoto, M.D. PROCEDURE DATE:  07/30/2015 PROCEDURE:   Colonoscopy, screening First Screening Colonoscopy - Avg.  risk and is 50 yrs.  old or older - No.  Prior Negative Screening - Now for repeat screening. N/A  History of Adenoma - Now for follow-up colonoscopy & has been > or = to 3 yrs.  N/A  Polyps removed today? No Recommend repeat exam, <10 yrs? No ASA CLASS:   Class III INDICATIONS:Screening for colonic neoplasia and Colorectal Neoplasm Risk Assessment for this procedure is average risk. MEDICATIONS: Propofol 200 mg IV  DESCRIPTION OF PROCEDURE:   After the risks benefits and alternatives of the procedure were thoroughly explained, informed consent was obtained.  The digital rectal exam revealed no abnormalities of the rectum.   The LB [id number]  endoscope was introduced through the anus and advanced to the cecum, which was identified by both the appendix and ileocecal valve. No adverse events experienced.   The quality of the prep was adequate  The instrument was then slowly withdrawn as the colon was fully examined. Estimated blood loss is zero unless otherwise noted in this procedure report.      COLON FINDINGS: There was severe diverticulosis noted throughout the entire examined colon with associated luminal narrowing, petechiae, tortuosity and muscular hypertrophy.  Retroflexed views revealed no abnormalities. The time to cecum = 6.2 Withdrawal time = 9.1   The scope was withdrawn and the procedure completed. COMPLICATIONS: There were no immediate complications.  ENDOSCOPIC IMPRESSION: There was severe diverticulosis noted throughout the entire examined colon  RECOMMENDATIONS: Given your age, you will not need  another colonoscopy for colon cancer screening or polyp surveillance.  These types of tests usually stop around the age 31. Ok to restart Coumadin tomorrow  eSigned:  Harl Bowie, MD 07/30/2015 2:07 PM Revised: 07/30/2015 2:07 PM

## 2015-07-30 NOTE — Patient Instructions (Signed)
Discharge instructions given. Handout on diverticulosis. Resume previous medications. YOU HAD AN ENDOSCOPIC PROCEDURE TODAY AT [facility]:   Refer to the procedure report that was given to you for any specific questions about what was found during the examination.  If the procedure report does not answer your questions, please call your gastroenterologist to clarify.  If you requested that your care partner not be given the details of your procedure findings, then the procedure report has been included in a sealed envelope for you to review at your convenience later.  YOU SHOULD EXPECT: Some feelings of bloating in the abdomen. Passage of more gas than usual.  Walking can help get rid of the air that was put into your GI tract during the procedure and reduce the bloating. If you had a lower endoscopy (such as a colonoscopy or flexible sigmoidoscopy) you may notice spotting of blood in your stool or on the toilet paper. If you underwent a bowel prep for your procedure, you may not have a normal bowel movement for a few days.  Please Note:  You might notice some irritation and congestion in your nose or some drainage.  This is from the oxygen used during your procedure.  There is no need for concern and it should clear up in a day or so.  SYMPTOMS TO REPORT IMMEDIATELY:   Following lower endoscopy (colonoscopy or flexible sigmoidoscopy):  Excessive amounts of blood in the stool  Significant tenderness or worsening of abdominal pains  Swelling of the abdomen that is new, acute  Fever of 100F or higher  For urgent or emergent issues, a gastroenterologist can be reached at any hour by calling [phone].   DIET: Your first meal following the procedure should be a small meal and then it is ok to progress to your normal diet. Heavy or fried foods are harder to digest and may make you feel nauseous or bloated.  Likewise, meals heavy in dairy and vegetables can increase bloating.   Drink plenty of fluids but you should avoid alcoholic beverages for 24 hours.  ACTIVITY:  You should plan to take it easy for the rest of today and you should NOT DRIVE or use heavy machinery until tomorrow (because of the sedation medicines used during the test).    FOLLOW UP: Our staff will call the number listed on your records the next business day following your procedure to check on you and address any questions or concerns that you may have regarding the information given to you following your procedure. If we do not reach you, we will leave a message.  However, if you are feeling well and you are not experiencing any problems, there is no need to return our call.  We will assume that you have returned to your regular daily activities without incident.  If any biopsies were taken you will be contacted by phone or by letter within the next 1-3 weeks.  Please call us at [phone] if you have not heard about the biopsies in 3 weeks.    SIGNATURES/CONFIDENTIALITY: You and/or your care partner have signed paperwork which will be entered into your electronic medical record.  These signatures attest to the fact that that the information above on your After Visit Summary has been reviewed and is understood.  Full responsibility of the confidentiality of this discharge information lies with you and/or your care-partner.

## 2015-07-30 NOTE — Progress Notes (Signed)
Report to PACU, RN, vss, BBS= Clear.  

## 2015-07-31 ENCOUNTER — Telehealth: Payer: Self-pay | Admitting: *Deleted

## 2015-07-31 NOTE — Telephone Encounter (Signed)
  Follow up Call-  Call back number 07/30/2015 06/25/2015  Post procedure Call Back phone  # [phone]  Permission to leave phone message Yes Yes     Patient questions:  Do you have a fever, pain , or abdominal swelling? No. Pain Score  0 *  Have you tolerated food without any problems? Yes.    Have you been able to return to your normal activities? Yes.    Do you have any questions about your discharge instructions: Diet   No. Medications  No. Follow up visit  No.  Do you have questions or concerns about your Care? No.  Actions: * If pain score is 4 or above: No action needed, pain <4.

## 2015-08-04 ENCOUNTER — Other Ambulatory Visit (INDEPENDENT_AMBULATORY_CARE_PROVIDER_SITE_OTHER): Payer: Medicare Other

## 2015-08-04 DIAGNOSIS — Z0000 Encounter for general adult medical examination without abnormal findings: Secondary | ICD-10-CM | POA: Diagnosis not present

## 2015-08-04 LAB — CBC WITH DIFFERENTIAL/PLATELET
BASOS ABS: 0.1 10*3/uL (ref 0.0–0.1)
Basophils Relative: 0.7 % (ref 0.0–3.0)
EOS ABS: 0.1 10*3/uL (ref 0.0–0.7)
EOS PCT: 1.4 % (ref 0.0–5.0)
HCT: 38.3 % (ref 36.0–46.0)
HEMOGLOBIN: 12.7 g/dL (ref 12.0–15.0)
Lymphocytes Relative: 18 % (ref 12.0–46.0)
Lymphs Abs: 1.6 10*3/uL (ref 0.7–4.0)
MCHC: 33.1 g/dL (ref 30.0–36.0)
MCV: 88.6 fl (ref 78.0–100.0)
MONO ABS: 0.6 10*3/uL (ref 0.1–1.0)
Monocytes Relative: 6.7 % (ref 3.0–12.0)
NEUTROS PCT: 73.2 % (ref 43.0–77.0)
Neutro Abs: 6.4 10*3/uL (ref 1.4–7.7)
Platelets: 224 10*3/uL (ref 150.0–400.0)
RBC: 4.32 Mil/uL (ref 3.87–5.11)
RDW: 14.3 % (ref 11.5–15.5)
WBC: 8.8 10*3/uL (ref 4.0–10.5)

## 2015-08-04 LAB — CUP PACEART REMOTE DEVICE CHECK
Battery Impedance: 2450 Ohm
Brady Statistic RV Percent Paced: 99 %
Date Time Interrogation Session: 20170124144626
Implantable Lead Implant Date: 20090116
Implantable Lead Location: 753860
Lead Channel Impedance Value: 476 Ohm
Lead Channel Impedance Value: 67 Ohm
Lead Channel Setting Pacing Amplitude: 2.75 V
Lead Channel Setting Pacing Pulse Width: 0.4 ms
Lead Channel Setting Sensing Sensitivity: 2 mV
MDC IDC LEAD IMPLANT DT: 20090116
MDC IDC LEAD LOCATION: 753859
MDC IDC MSMT BATTERY REMAINING LONGEVITY: 23 mo
MDC IDC MSMT BATTERY VOLTAGE: 2.74 V
MDC IDC MSMT LEADCHNL RV PACING THRESHOLD AMPLITUDE: 1.375 V
MDC IDC MSMT LEADCHNL RV PACING THRESHOLD PULSEWIDTH: 0.4 ms

## 2015-08-04 LAB — BASIC METABOLIC PANEL
BUN: 54 mg/dL — AB (ref 6–23)
CALCIUM: 9.9 mg/dL (ref 8.4–10.5)
CHLORIDE: 96 meq/L (ref 96–112)
CO2: 29 mEq/L (ref 19–32)
Creatinine, Ser: 2.39 mg/dL — ABNORMAL HIGH (ref 0.40–1.20)
GFR: 21.02 mL/min — AB (ref >60.00)
Glucose, Bld: 85 mg/dL (ref 70–99)
Potassium: 3.6 mEq/L (ref 3.5–5.1)
Sodium: 137 mEq/L (ref 135–145)

## 2015-08-04 LAB — URINALYSIS, MICROSCOPIC ONLY

## 2015-08-04 LAB — LIPID PANEL
CHOL/HDL RATIO: 4
Cholesterol: 178 mg/dL (ref 0–200)
HDL: 47.1 mg/dL (ref >39.00)
LDL CALC: 99 mg/dL (ref 0–99)
NonHDL: 131.11
Triglycerides: 162 mg/dL — ABNORMAL HIGH (ref 0.0–149.0)
VLDL: 32.4 mg/dL (ref 0.0–40.0)

## 2015-08-04 LAB — IBC PANEL
IRON: 55 ug/dL (ref 42–145)
Saturation Ratios: 16.9 % — ABNORMAL LOW (ref 20.0–50.0)
Transferrin: 232 mg/dL (ref 212.0–360.0)

## 2015-08-04 LAB — FERRITIN: FERRITIN: 72.4 ng/mL (ref 10.0–291.0)

## 2015-08-04 LAB — HEPATIC FUNCTION PANEL
ALK PHOS: 50 U/L (ref 39–117)
ALT: 13 U/L (ref 0–35)
AST: 20 U/L (ref 0–37)
Albumin: 4.3 g/dL (ref 3.5–5.2)
BILIRUBIN DIRECT: 0.1 mg/dL (ref 0.0–0.3)
BILIRUBIN TOTAL: 0.6 mg/dL (ref 0.2–1.2)
Total Protein: 7.4 g/dL (ref 6.0–8.3)

## 2015-08-04 LAB — TSH: TSH: 1.55 u[IU]/mL (ref 0.35–4.50)

## 2015-08-04 LAB — PHOSPHORUS: PHOSPHORUS: 4.3 mg/dL (ref 2.3–4.6)

## 2015-08-05 LAB — PTH, INTACT AND CALCIUM
CALCIUM: 9.5 mg/dL (ref 8.4–10.5)
PTH: 132 pg/mL — ABNORMAL HIGH (ref 14–64)

## 2015-08-06 ENCOUNTER — Ambulatory Visit (INDEPENDENT_AMBULATORY_CARE_PROVIDER_SITE_OTHER): Payer: Medicare Other | Admitting: Family Medicine

## 2015-08-06 VITALS — BP 130/80 | HR 88 | Temp 98.1°F | Ht 60.5 in | Wt 156.4 lb

## 2015-08-06 DIAGNOSIS — Z23 Encounter for immunization: Secondary | ICD-10-CM | POA: Diagnosis not present

## 2015-08-06 DIAGNOSIS — E785 Hyperlipidemia, unspecified: Secondary | ICD-10-CM

## 2015-08-06 DIAGNOSIS — Z780 Asymptomatic menopausal state: Secondary | ICD-10-CM | POA: Diagnosis not present

## 2015-08-06 DIAGNOSIS — Z0000 Encounter for general adult medical examination without abnormal findings: Secondary | ICD-10-CM

## 2015-08-06 NOTE — Progress Notes (Signed)
Subjective:    Patient ID: Yolanda Huffman, female    DOB: [birth year], 75 y.o.   MRN: [id number]  HPI Patient seen for physical exam. She has multiple chronic medical problems including chronic kidney disease (followed by nephrology), combined systolic and diastolic heart failure, history of atrial fibrillation, CAD, hypertension, hyperlipidemia, type 2 diabetes on insulin therapy, osteoporosis history.  Several health maintenance issues due. She needs flu vaccine and also Prevnar 13. Needs to set a diabetic eye exam. She's not had bone density scan in over 2 years. No history of shingles vaccine. Tetanus possibly over 10 years ago. She is getting regular mammograms.  Past Medical History  Diagnosis Date  . Diabetes mellitus   . Hyperlipemia   . Hypertension   . CAD (coronary artery disease)     s/p PCI in [place] in 1/09  . Pacemaker   . Atrial fibrillation ([place])     prior Sotalol - d/c'd 2/2 increased creatinine; rate control strategy  . Anemia   . PUD (peptic ulcer disease) 10/2010    duodenal ulcer  . CKD (chronic kidney disease)   . Chronic combined systolic and diastolic heart failure (HCC)     Echocardiogram 3/12: Mild LVH, EF Q000111Q, normal diastolic function, mild AI, mild MR, PASP 44, normal wall motion  . Osteoporosis   . Anemia   . Pneumonia   . Pleural effusion   . MI, old     2009   Past Surgical History  Procedure Laterality Date  . Pacemaker insertion  07/13/07  . Cataract extraction Bilateral     01/12/09 and 01/26/09 both eyes  . Stent implant  07/13/2007    reports that she quit smoking about 24 years ago. Her smoking use included Cigarettes. She has a 40 pack-year smoking history. She has never used smokeless tobacco. She reports that she does not drink alcohol or use illicit drugs. family history includes Breast cancer in her maternal aunt and sister; Clotting disorder in her father; Heart attack in her father; Heart disease in her father and mother;  Hypertension in her father. There is no history of Colon cancer. Allergies  Allergen Reactions  . Amlodipine Besylate     edema       Review of Systems  Constitutional: Negative for fever, chills, activity change, appetite change, fatigue and unexpected weight change.  HENT: Negative for ear pain, hearing loss, sore throat and trouble swallowing.   Eyes: Negative for visual disturbance.  Respiratory: Negative for cough and shortness of breath.   Cardiovascular: Negative for chest pain and palpitations.  Gastrointestinal: Negative for abdominal pain, diarrhea, constipation and blood in stool.  Endocrine: Negative for polydipsia and polyuria.  Genitourinary: Negative for dysuria and hematuria.  Musculoskeletal: Positive for arthralgias. Negative for myalgias and back pain.  Skin: Negative for rash.  Neurological: Negative for dizziness, syncope and headaches.  Hematological: Negative for adenopathy.  Psychiatric/Behavioral: Negative for confusion and dysphoric mood.       Objective:   Physical Exam  Constitutional: She is oriented to person, place, and time. She appears well-developed and well-nourished.  HENT:  Head: Normocephalic and atraumatic.  Eyes: EOM are normal. Pupils are equal, round, and reactive to light.  Neck: Normal range of motion. Neck supple. No thyromegaly present.  Cardiovascular: Normal rate, regular rhythm and normal heart sounds.   No murmur heard. Pulmonary/Chest: Breath sounds normal. No respiratory distress. She has no wheezes. She has no rales.  Abdominal: Soft. Bowel sounds are normal. She  exhibits no distension and no mass. There is no tenderness. There is no rebound and no guarding.  Musculoskeletal: Normal range of motion. She exhibits no edema.  Lymphadenopathy:    She has no cervical adenopathy.  Neurological: She is alert and oriented to person, place, and time. She displays normal reflexes. No cranial nerve deficit.  Skin: No rash noted.    Psychiatric: She has a normal mood and affect. Her behavior is normal. Judgment and thought content normal.          Assessment & Plan:  Physical exam. Several issues addressed. Prevnar 13 and flu vaccines given. Set up repeat bone density scan. Continue yearly mammogram. She will check on coverage for shingles vaccine. She prefers to return later for tetanus vaccine.  Labs reviewed.  lipids sub-optimal control.   LDL cholesterol 113. Increase Lipitor to 40 mg daily. Plan follow-up and a few months and repeat lipid and hepatic panel then.

## 2015-08-06 NOTE — Patient Instructions (Signed)
Get DEXA scan-bone density scan Continue with yearly flu vaccine Set up diabetic eye exam Check on coverage for shingles vaccine Return for tetanus vaccine  Increase the Lipitor to 40 mg

## 2015-08-07 ENCOUNTER — Encounter: Payer: Self-pay | Admitting: Cardiology

## 2015-08-12 ENCOUNTER — Other Ambulatory Visit: Payer: Self-pay | Admitting: Family Medicine

## 2015-08-17 ENCOUNTER — Ambulatory Visit (INDEPENDENT_AMBULATORY_CARE_PROVIDER_SITE_OTHER): Payer: Medicare Other | Admitting: General Practice

## 2015-08-17 DIAGNOSIS — Z5181 Encounter for therapeutic drug level monitoring: Secondary | ICD-10-CM

## 2015-08-17 LAB — POCT INR: INR: 2.8

## 2015-08-17 NOTE — Progress Notes (Signed)
Pre visit review using our clinic review tool, if applicable. No additional management support is needed unless otherwise documented below in the visit note. 

## 2015-08-19 ENCOUNTER — Other Ambulatory Visit: Payer: Self-pay | Admitting: Family Medicine

## 2015-08-19 ENCOUNTER — Telehealth: Payer: Self-pay | Admitting: Family Medicine

## 2015-08-19 MED ORDER — ATORVASTATIN CALCIUM 40 MG PO TABS: 40.0000 mg | ORAL_TABLET | Freq: Every day | ORAL | Status: DC

## 2015-08-19 NOTE — Telephone Encounter (Signed)
Resent in the correct medication.

## 2015-08-19 NOTE — Telephone Encounter (Signed)
Pt request refill of the following: atorvastatin (LIPITOR) 40 MG tablet  Pt told pharmacy her dose was increase to 40mg  and it is showing on her med list     Phamacy: [facility]

## 2015-08-20 ENCOUNTER — Inpatient Hospital Stay: Admission: RE | Admit: 2015-08-20 | Payer: Medicare Other | Source: Ambulatory Visit

## 2015-08-25 ENCOUNTER — Ambulatory Visit (INDEPENDENT_AMBULATORY_CARE_PROVIDER_SITE_OTHER)
Admission: RE | Admit: 2015-08-25 | Discharge: 2015-08-25 | Disposition: A | Discharge status: Home / Self Care | Payer: Medicare Other | Source: Ambulatory Visit | Attending: Family Medicine | Admitting: Family Medicine

## 2015-08-25 DIAGNOSIS — Z780 Asymptomatic menopausal state: Secondary | ICD-10-CM

## 2015-09-02 LAB — HM DIABETES EYE EXAM

## 2015-09-09 ENCOUNTER — Encounter: Payer: Self-pay | Admitting: Family Medicine

## 2015-09-10 ENCOUNTER — Encounter: Payer: Self-pay | Admitting: Internal Medicine

## 2015-09-21 ENCOUNTER — Other Ambulatory Visit: Payer: Self-pay | Admitting: Internal Medicine

## 2015-09-21 ENCOUNTER — Other Ambulatory Visit: Payer: Self-pay | Admitting: Family Medicine

## 2015-09-28 ENCOUNTER — Ambulatory Visit (INDEPENDENT_AMBULATORY_CARE_PROVIDER_SITE_OTHER): Payer: Medicare Other | Admitting: General Practice

## 2015-09-28 DIAGNOSIS — Z5181 Encounter for therapeutic drug level monitoring: Secondary | ICD-10-CM

## 2015-09-28 LAB — POCT INR: INR: 3.2

## 2015-09-28 NOTE — Progress Notes (Signed)
Pre visit review using our clinic review tool, if applicable. No additional management support is needed unless otherwise documented below in the visit note. 

## 2015-10-03 ENCOUNTER — Other Ambulatory Visit: Payer: Self-pay | Admitting: Family Medicine

## 2015-10-26 ENCOUNTER — Ambulatory Visit (INDEPENDENT_AMBULATORY_CARE_PROVIDER_SITE_OTHER): Payer: Medicare Other | Admitting: General Practice

## 2015-10-26 DIAGNOSIS — Z5181 Encounter for therapeutic drug level monitoring: Secondary | ICD-10-CM

## 2015-10-26 LAB — POCT INR: INR: 2.8

## 2015-10-26 NOTE — Progress Notes (Signed)
Pre visit review using our clinic review tool, if applicable. No additional management support is needed unless otherwise documented below in the visit note. 

## 2015-10-29 ENCOUNTER — Encounter: Payer: Medicare Other | Admitting: Internal Medicine

## 2015-11-03 ENCOUNTER — Ambulatory Visit (INDEPENDENT_AMBULATORY_CARE_PROVIDER_SITE_OTHER): Payer: Medicare Other | Admitting: Family Medicine

## 2015-11-03 VITALS — BP 122/70 | HR 92 | Temp 97.7°F | Ht 60.5 in | Wt 158.0 lb

## 2015-11-03 DIAGNOSIS — M10041 Idiopathic gout, right hand: Secondary | ICD-10-CM | POA: Diagnosis not present

## 2015-11-03 DIAGNOSIS — E1121 Type 2 diabetes mellitus with diabetic nephropathy: Secondary | ICD-10-CM

## 2015-11-03 DIAGNOSIS — N184 Chronic kidney disease, stage 4 (severe): Secondary | ICD-10-CM

## 2015-11-03 DIAGNOSIS — E785 Hyperlipidemia, unspecified: Secondary | ICD-10-CM

## 2015-11-03 DIAGNOSIS — M109 Gout, unspecified: Secondary | ICD-10-CM

## 2015-11-03 LAB — LIPID PANEL
CHOLESTEROL: 162 mg/dL (ref 0–200)
HDL: 45.9 mg/dL (ref >39.00)
LDL Cholesterol: 82 mg/dL (ref 0–99)
NONHDL: 115.64
Total CHOL/HDL Ratio: 4
Triglycerides: 166 mg/dL — ABNORMAL HIGH (ref 0.0–149.0)
VLDL: 33.2 mg/dL (ref 0.0–40.0)

## 2015-11-03 LAB — HEMOGLOBIN A1C: HEMOGLOBIN A1C: 7.3 % — AB (ref 4.6–6.5)

## 2015-11-03 NOTE — Progress Notes (Signed)
Pre visit review using our clinic review tool, if applicable. No additional management support is needed unless otherwise documented below in the visit note. 

## 2015-11-03 NOTE — Progress Notes (Signed)
Subjective:    Patient ID: Yolanda Huffman, female    DOB: [birth year], 75 y.o.   MRN: [id number]  HPI Patient seen for several things today as follows  Right thumb pain for the past week. She's noted some redness swelling and warmth of the MCP joint. No injury. History of gout and has had similar flareup in the fingers before Takes Colchicine as needed. Pain fairly well controlled. Cannot take Indocin because of Coumadin use. She has type 2 diabetes and tries to avoid prednisone  Type 2 diabetes. Fasting blood sugar this morning 88. She is overdue for A1c. No polydipsia or polyuria. Takes low-dose Levemir.  Chronic kidney disease followed by nephrology. She is scheduled for labs next week per them. Hyperlipidemia and recent increase of Lipitor 40 mg daily. She's not had any myalgias or other adverse side effects. We plan on follow-up lipid panel today  Atrial fibrillation on Coumadin. No recent bleeding complication.  Past Medical History  Diagnosis Date  . Diabetes mellitus   . Hyperlipemia   . Hypertension   . CAD (coronary artery disease)     s/p PCI in [place] in 1/09  . Pacemaker   . Atrial fibrillation ([place])     prior Sotalol - d/c'd 2/2 increased creatinine; rate control strategy  . Anemia   . PUD (peptic ulcer disease) 10/2010    duodenal ulcer  . CKD (chronic kidney disease)   . Chronic combined systolic and diastolic heart failure (HCC)     Echocardiogram 3/12: Mild LVH, EF Q000111Q, normal diastolic function, mild AI, mild MR, PASP 44, normal wall motion  . Osteoporosis   . Anemia   . Pneumonia   . Pleural effusion   . MI, old     2009   Past Surgical History  Procedure Laterality Date  . Pacemaker insertion  07/13/07  . Cataract extraction Bilateral     01/12/09 and 01/26/09 both eyes  . Stent implant  07/13/2007    reports that she quit smoking about 24 years ago. Her smoking use included Cigarettes. She has a 40 pack-year smoking history. She has never used  smokeless tobacco. She reports that she does not drink alcohol or use illicit drugs. family history includes Breast cancer in her maternal aunt and sister; Clotting disorder in her father; Heart attack in her father; Heart disease in her father and mother; Hypertension in her father. There is no history of Colon cancer. Allergies  Allergen Reactions  . Amlodipine Besylate     edema      Review of Systems  Constitutional: Negative for fatigue.  Eyes: Negative for visual disturbance.  Respiratory: Negative for cough, chest tightness, shortness of breath and wheezing.   Cardiovascular: Negative for chest pain, palpitations and leg swelling.  Gastrointestinal: Negative for abdominal pain.  Endocrine: Negative for polydipsia and polyuria.  Musculoskeletal: Positive for arthralgias.  Neurological: Negative for dizziness, seizures, syncope, weakness, light-headedness and headaches.       Objective:   Physical Exam  Constitutional: She appears well-developed and well-nourished.  Neck: Neck supple. No thyromegaly present.  Cardiovascular: Normal rate.   Pulmonary/Chest: Effort normal and breath sounds normal. No respiratory distress. She has no wheezes. She has no rales.  Musculoskeletal: She exhibits no edema.  Right thumb reveals some redness and warmth and tenderness and mild edema around MCP joint.          Assessment & Plan:  #1 acute gout right thumb MCP joint. Continue colchicine. Would avoid indomethacin  with history of chronic Coumadin. Try to avoid prednisone with type 2 diabetes  #2 type 2 diabetes. Well controlled by home readings. Recheck A1c  #3 chronic kidney disease followed by nephrology  #4 dyslipidemia. Recheck lipid panel with recent increase in Lipitor as above  #5 atrial fibrillation. Patient Coumadin. Continue regular follow-up with Coumadin clinic. She has follow-up with cardiology later this week  Eulas Post MD [facility] at  [facility]

## 2015-11-06 ENCOUNTER — Ambulatory Visit (INDEPENDENT_AMBULATORY_CARE_PROVIDER_SITE_OTHER): Payer: Medicare Other | Admitting: Internal Medicine

## 2015-11-06 ENCOUNTER — Encounter: Payer: Self-pay | Admitting: Internal Medicine

## 2015-11-06 VITALS — BP 142/68 | HR 81 | Ht 60.5 in | Wt 158.6 lb

## 2015-11-06 DIAGNOSIS — I482 Chronic atrial fibrillation, unspecified: Secondary | ICD-10-CM

## 2015-11-06 NOTE — Patient Instructions (Signed)
Medication Instructions:  Your physician recommends that you continue on your current medications as directed. Please refer to the Current Medication list given to you today.   Labwork: None ordered   Testing/Procedures: None ordered   Follow-Up: Your physician wants you to follow-up in: 12 months with Dr Knox Saliva will receive a reminder letter in the mail two months in advance. If you don't receive a letter, please call our office to schedule the follow-up appointment.  Remote monitoring is used to monitor your Pacemaker from home. This monitoring reduces the number of office visits required to check your device to one time per year. It allows [place] to keep an eye on the functioning of your device to ensure it is working properly. You are scheduled for a device check from home on 02/08/16. You may send your transmission at any time that day. If you have a wireless device, the transmission will be sent automatically. After your physician reviews your transmission, you will receive a postcard with your next transmission date.     Any Other Special Instructions Will Be Listed Below (If Applicable).     If you need a refill on your cardiac medications before your next appointment, please call your pharmacy.

## 2015-11-06 NOTE — Progress Notes (Signed)
HPI Yolanda Huffman returns today for follow-up. She is a very pleasant 75 year old woman with a history of sinus node dysfunction, and atrial fibrillation. She is status post permanent pacemaker insertion. She has done well in the interim. The patient denies chest pain and shortness of breath. No peripheral edema. She is sedentary. She has not had syncope. She does not experience palpitations. She admits to dietary indiscretion with sodium but is trying to do better.  Allergies  Allergen Reactions  . Amlodipine Besylate     edema     Current Outpatient Prescriptions  Medication Sig Dispense Refill  . atorvastatin (LIPITOR) 40 MG tablet Take 1 tablet (40 mg total) by mouth daily. 30 tablet 11  . calcitRIOL (ROCALTROL) 0.25 MCG capsule Take 0.25 mcg by mouth only on Mon, Weds, Fri    . colchicine 0.6 MG tablet TAKE 1 TABLET (0.6 MG TOTAL) BY MOUTH AS NEEDED AS DIRECTED. 30 tablet 4  . Cranberry 1000 MG CAPS Take 1 capsule by mouth daily.    . fish oil-omega-3 fatty acids 1000 MG capsule Take 1 g by mouth 2 (two) times daily.     . furosemide (LASIX) 40 MG tablet TAKE 2 TABLETS BY MOUTH IN THE AM AND 1 TABLET BY MOUTH IN THE PM 270 tablet 0  . glucose blood test strip 1 each by Other route 2 (two) times daily. Use as instructed     . insulin detemir (LEVEMIR) 100 UNIT/ML injection Inject 12 Units into the skin at bedtime. And on sliding scale    . metolazone (ZAROXOLYN) 2.5 MG tablet TAKE 1 TABLET ON M, W, FRI. ONLY; TAKE THIS WITH YOUR LASIX; YOU WILL ALSO NEED TO TAKE AN EXTRA POTASSIUM ON THESE DAYS AS WELL 27 tablet 4  . metoprolol succinate (TOPROL-XL) 25 MG 24 hr tablet TAKE 2 TABLETS BY MOUTH EVERY MORNING AND 1 TABLET BY MOUTH EVERY EVENING. 90 tablet 2  . Multiple Vitamin (MULTIVITAMIN) capsule Take 1 capsule by mouth daily.    . pantoprazole (PROTONIX) 40 MG tablet TAKE 1 TABLET (40 MG TOTAL) BY MOUTH DAILY. NEED TO MAKE AN APPOINTMENT. 30 tablet 11  . potassium chloride SA  (K-DUR,KLOR-CON) 20 MEQ tablet Take 2 tablets (40 mEq total) by mouth 2 (two) times daily. 120 tablet 11  . warfarin (COUMADIN) 5 MG tablet Take as directed by anticoagulation clinic 30 tablet 3   No current facility-administered medications for this visit.     Past Medical History  Diagnosis Date  . Diabetes mellitus   . Hyperlipemia   . Hypertension   . CAD (coronary artery disease)     s/p PCI in [place] in 1/09  . Pacemaker   . Atrial fibrillation ([place])     prior Sotalol - d/c'd 2/2 increased creatinine; rate control strategy  . Anemia   . PUD (peptic ulcer disease) 10/2010    duodenal ulcer  . CKD (chronic kidney disease)   . Chronic combined systolic and diastolic heart failure (HCC)     Echocardiogram 3/12: Mild LVH, EF Q000111Q, normal diastolic function, mild AI, mild MR, PASP 44, normal wall motion  . Osteoporosis   . Anemia   . Pneumonia   . Pleural effusion   . MI, old     2009    ROS:   All systems reviewed and negative except as noted in the HPI.   Past Surgical History  Procedure Laterality Date  . Pacemaker insertion  07/13/07  . Cataract extraction  Bilateral     01/12/09 and 01/26/09 both eyes  . Stent implant  07/13/2007     Family History  Problem Relation Age of Onset  . Heart disease Father   . Heart attack Father   . Clotting disorder Father     blood clot  . Hypertension Father   . Breast cancer Maternal Aunt   . Breast cancer Sister   . Heart disease Mother   . Colon cancer Neg Hx      Social History   Social History  . Marital Status: Widowed    Spouse Name: N/A  . Number of Children: 1  . Years of Education: N/A   Occupational History  . retired [employer]    Social History Main Topics  . Smoking status: Former Smoker -- 2.00 packs/day for 20 years    Types: Cigarettes    Quit date: 06/28/1991  . Smokeless tobacco: Never Used  . Alcohol Use: No  . Drug Use: No  . Sexual Activity: Not on file   Other Topics Concern  . Not  on file   Social History Narrative   Reg exercise           BP 142/68 mmHg  Pulse 81  Ht 5' 0.5" (1.537 m)  Wt 158 lb 9.6 oz (71.94 kg)  BMI 30.45 kg/m2  Physical Exam:  Well appearing 75 year old woman, NAD HEENT: Unremarkable Neck:  6 cm JVD, no thyromegally Back:  No CVA tenderness Lungs:  Clear with no wheezes, rales, or rhonchi. HEART:  Regular rate rhythm, no murmurs, no rubs, no clicks Abd:  soft, positive bowel sounds, no organomegally, no rebound, no guarding Ext:  2 plus pulses, no edema, no cyanosis, no clubbing Skin:  No rashes no nodules Neuro:  CN II through XII intact, motor grossly intact  ECG - atrial fib with ventricular pacing  DEVICE  Normal device function.  See PaceArt for details.   Assess/Plan: 1. Atrial fib - she is doing well. No change in meds. She will continue her systemic anti-coagulation. 2. Diastolic heart failure - her symptoms are class 2. She will continue her current meds. She is instructed to maintain a low sodium diet. 3. PPM - her Medtronic DDD PM is working normally. Will recheck in several months.  Mikle Bosworth.D.

## 2015-11-10 LAB — CUP PACEART INCLINIC DEVICE CHECK
Battery Remaining Longevity: 22 mo
Date Time Interrogation Session: 20170512131244
Implantable Lead Implant Date: 20090116
Implantable Lead Location: 753859
Implantable Lead Model: 5076
Lead Channel Impedance Value: 533 Ohm
Lead Channel Pacing Threshold Amplitude: 2.75 V
Lead Channel Pacing Threshold Pulse Width: 0.12 ms
Lead Channel Sensing Intrinsic Amplitude: 4 mV
MDC IDC LEAD IMPLANT DT: 20090116
MDC IDC LEAD LOCATION: 753860
MDC IDC MSMT BATTERY IMPEDANCE: 2643 Ohm
MDC IDC MSMT BATTERY VOLTAGE: 2.73 V
MDC IDC MSMT LEADCHNL RA IMPEDANCE VALUE: 67 Ohm
MDC IDC SET LEADCHNL RV PACING AMPLITUDE: 2.75 V
MDC IDC SET LEADCHNL RV PACING PULSEWIDTH: 0.4 ms
MDC IDC SET LEADCHNL RV SENSING SENSITIVITY: 2 mV
MDC IDC STAT BRADY RV PERCENT PACED: 99 %

## 2015-11-10 NOTE — Addendum Note (Signed)
Addended by: Freada Bergeron on: 11/10/2015 04:52 PM   Modules accepted: Orders

## 2015-11-16 ENCOUNTER — Other Ambulatory Visit: Payer: Self-pay | Admitting: Internal Medicine

## 2015-11-16 ENCOUNTER — Other Ambulatory Visit: Payer: Self-pay | Admitting: Family Medicine

## 2015-11-30 ENCOUNTER — Ambulatory Visit (INDEPENDENT_AMBULATORY_CARE_PROVIDER_SITE_OTHER): Payer: Medicare Other | Admitting: General Practice

## 2015-11-30 DIAGNOSIS — Z5181 Encounter for therapeutic drug level monitoring: Secondary | ICD-10-CM | POA: Diagnosis not present

## 2015-11-30 LAB — POCT INR: INR: 4.4

## 2015-11-30 NOTE — Progress Notes (Signed)
Pre visit review using our clinic review tool, if applicable. No additional management support is needed unless otherwise documented below in the visit note. 

## 2015-12-14 ENCOUNTER — Ambulatory Visit: Payer: Medicare Other

## 2015-12-21 ENCOUNTER — Ambulatory Visit (INDEPENDENT_AMBULATORY_CARE_PROVIDER_SITE_OTHER): Payer: Medicare Other | Admitting: General Practice

## 2015-12-21 DIAGNOSIS — Z5181 Encounter for therapeutic drug level monitoring: Secondary | ICD-10-CM

## 2015-12-21 LAB — POCT INR: INR: 3

## 2015-12-21 NOTE — Progress Notes (Signed)
Pre visit review using our clinic review tool, if applicable. No additional management support is needed unless otherwise documented below in the visit note. 

## 2015-12-28 ENCOUNTER — Other Ambulatory Visit: Payer: Self-pay | Admitting: Family Medicine

## 2015-12-28 NOTE — Telephone Encounter (Signed)
Refill for 3 months. 

## 2016-01-18 ENCOUNTER — Other Ambulatory Visit: Payer: Self-pay | Admitting: *Deleted

## 2016-01-18 ENCOUNTER — Ambulatory Visit: Payer: Medicare Other

## 2016-01-18 NOTE — Telephone Encounter (Signed)
Refills OK. 

## 2016-01-19 MED ORDER — FUROSEMIDE 40 MG PO TABS: ORAL_TABLET | ORAL | 0 refills | Status: DC

## 2016-02-01 ENCOUNTER — Ambulatory Visit (INDEPENDENT_AMBULATORY_CARE_PROVIDER_SITE_OTHER): Payer: Medicare Other | Admitting: General Practice

## 2016-02-01 DIAGNOSIS — Z5181 Encounter for therapeutic drug level monitoring: Secondary | ICD-10-CM | POA: Diagnosis not present

## 2016-02-01 LAB — POCT INR: INR: 3.6

## 2016-02-08 ENCOUNTER — Ambulatory Visit (INDEPENDENT_AMBULATORY_CARE_PROVIDER_SITE_OTHER): Payer: Medicare Other | Admitting: *Deleted

## 2016-02-08 DIAGNOSIS — I482 Chronic atrial fibrillation, unspecified: Secondary | ICD-10-CM

## 2016-02-08 DIAGNOSIS — Z950 Presence of cardiac pacemaker: Secondary | ICD-10-CM

## 2016-02-08 NOTE — Progress Notes (Signed)
Remote pacemaker transmission.   

## 2016-02-10 ENCOUNTER — Other Ambulatory Visit: Payer: Self-pay | Admitting: Family Medicine

## 2016-02-10 ENCOUNTER — Encounter: Payer: Self-pay | Admitting: Cardiology

## 2016-02-16 LAB — CUP PACEART REMOTE DEVICE CHECK
Battery Impedance: 2839 Ohm
Battery Remaining Longevity: 19 mo
Battery Voltage: 2.73 V
Brady Statistic RV Percent Paced: 100 %
Date Time Interrogation Session: 20170814122600
Implantable Lead Implant Date: 20090116
Implantable Lead Location: 753859
Implantable Lead Model: 5076
Implantable Lead Model: 5076
Lead Channel Impedance Value: 502 Ohm
Lead Channel Setting Sensing Sensitivity: 2 mV
MDC IDC LEAD IMPLANT DT: 20090116
MDC IDC LEAD LOCATION: 753860
MDC IDC MSMT LEADCHNL RA IMPEDANCE VALUE: 67 Ohm
MDC IDC MSMT LEADCHNL RV PACING THRESHOLD AMPLITUDE: 1.5 V
MDC IDC MSMT LEADCHNL RV PACING THRESHOLD PULSEWIDTH: 0.4 ms
MDC IDC SET LEADCHNL RV PACING AMPLITUDE: 3 V
MDC IDC SET LEADCHNL RV PACING PULSEWIDTH: 0.4 ms

## 2016-03-07 ENCOUNTER — Ambulatory Visit (INDEPENDENT_AMBULATORY_CARE_PROVIDER_SITE_OTHER): Payer: Medicare Other | Admitting: General Practice

## 2016-03-07 DIAGNOSIS — Z5181 Encounter for therapeutic drug level monitoring: Secondary | ICD-10-CM | POA: Diagnosis not present

## 2016-03-07 LAB — POCT INR: INR: 3.4

## 2016-04-04 ENCOUNTER — Ambulatory Visit (INDEPENDENT_AMBULATORY_CARE_PROVIDER_SITE_OTHER): Payer: Medicare Other | Admitting: General Practice

## 2016-04-04 DIAGNOSIS — Z5181 Encounter for therapeutic drug level monitoring: Secondary | ICD-10-CM

## 2016-04-04 LAB — POCT INR: INR: 2.1

## 2016-04-06 ENCOUNTER — Other Ambulatory Visit: Payer: Self-pay

## 2016-04-06 MED ORDER — METOLAZONE 2.5 MG PO TABS: ORAL_TABLET | ORAL | 4 refills | Status: DC

## 2016-04-11 ENCOUNTER — Other Ambulatory Visit: Payer: Self-pay | Admitting: Family Medicine

## 2016-04-12 ENCOUNTER — Other Ambulatory Visit: Payer: Self-pay

## 2016-04-12 MED ORDER — METOPROLOL SUCCINATE ER 25 MG PO TB24: ORAL_TABLET | ORAL | 1 refills | Status: DC

## 2016-04-12 NOTE — Telephone Encounter (Signed)
Refill for 6 months. 

## 2016-05-02 ENCOUNTER — Ambulatory Visit (INDEPENDENT_AMBULATORY_CARE_PROVIDER_SITE_OTHER): Payer: Medicare Other | Admitting: General Practice

## 2016-05-02 DIAGNOSIS — Z5181 Encounter for therapeutic drug level monitoring: Secondary | ICD-10-CM

## 2016-05-02 LAB — POCT INR: INR: 1.9

## 2016-05-02 NOTE — Patient Instructions (Signed)
Pre visit review using our clinic review tool, if applicable. No additional management support is needed unless otherwise documented below in the visit note. 

## 2016-05-06 ENCOUNTER — Ambulatory Visit (INDEPENDENT_AMBULATORY_CARE_PROVIDER_SITE_OTHER): Payer: Medicare Other | Admitting: Family Medicine

## 2016-05-06 VITALS — BP 130/70 | HR 94 | Temp 97.5°F | Ht 60.5 in | Wt 152.9 lb

## 2016-05-06 DIAGNOSIS — IMO0002 Reserved for concepts with insufficient information to code with codable children: Secondary | ICD-10-CM

## 2016-05-06 DIAGNOSIS — I482 Chronic atrial fibrillation, unspecified: Secondary | ICD-10-CM

## 2016-05-06 DIAGNOSIS — E1122 Type 2 diabetes mellitus with diabetic chronic kidney disease: Secondary | ICD-10-CM | POA: Diagnosis not present

## 2016-05-06 DIAGNOSIS — Z794 Long term (current) use of insulin: Secondary | ICD-10-CM | POA: Diagnosis not present

## 2016-05-06 DIAGNOSIS — N183 Chronic kidney disease, stage 3 (moderate): Secondary | ICD-10-CM

## 2016-05-06 DIAGNOSIS — E1165 Type 2 diabetes mellitus with hyperglycemia: Secondary | ICD-10-CM | POA: Diagnosis not present

## 2016-05-06 DIAGNOSIS — I10 Essential (primary) hypertension: Secondary | ICD-10-CM | POA: Diagnosis not present

## 2016-05-06 DIAGNOSIS — Z23 Encounter for immunization: Secondary | ICD-10-CM

## 2016-05-06 LAB — POCT GLYCOSYLATED HEMOGLOBIN (HGB A1C): HEMOGLOBIN A1C: 6.5

## 2016-05-06 NOTE — Patient Instructions (Signed)
If you continue to get fasting sugars < 70 back down Levemir to 10 units at night.

## 2016-05-06 NOTE — Progress Notes (Signed)
Pre visit review using our clinic review tool, if applicable. No additional management support is needed unless otherwise documented below in the visit note. 

## 2016-05-06 NOTE — Progress Notes (Signed)
Subjective:     Patient ID: Yolanda Huffman, female   DOB: April 08, 1941, 75 y.o.   MRN: [id number]  HPI Patient seen for medical follow-up. She has history of chronic kidney disease followed by nephrology, dyslipidemia, gout, type 2 diabetes, hypertension, atrial fibrillation. She has pacemaker and is followed by cardiology. She is on chronic Coumadin followed through the Coumadin clinic here at this office. No recent bleeding complications. No chest pains. No dyspnea.  Medications reviewed. We've been not following her kidney function because she gets this regularly through nephrology. Last A1c 7.3%. CBG this morning fasting 67. No recent hypoglycemic symptoms. She takes low-dose Levemir 12 units at night.  Still needs flu vaccine.  Past Medical History:  Diagnosis Date  . Anemia   . Anemia   . Atrial fibrillation ([place])    prior Sotalol - d/c'd 2/2 increased creatinine; rate control strategy  . CAD (coronary artery disease)    s/p PCI in [place] in 1/09  . Chronic combined systolic and diastolic heart failure (HCC)    Echocardiogram 3/12: Mild LVH, EF 00-86%, normal diastolic function, mild AI, mild MR, PASP 44, normal wall motion  . CKD (chronic kidney disease)   . Diabetes mellitus   . Hyperlipemia   . Hypertension   . MI, old    2009  . Osteoporosis   . Pacemaker   . Pleural effusion   . Pneumonia   . PUD (peptic ulcer disease) 10/2010   duodenal ulcer   Past Surgical History:  Procedure Laterality Date  . CATARACT EXTRACTION Bilateral    01/12/09 and 01/26/09 both eyes  . PACEMAKER INSERTION  07/13/07  . stent implant  07/13/2007    reports that she quit smoking about 24 years ago. Her smoking use included Cigarettes. She has a 40.00 pack-year smoking history. She has never used smokeless tobacco. She reports that she does not drink alcohol or use drugs. family history includes Breast cancer in her maternal aunt and sister; Clotting disorder in her father; Heart attack in her  father; Heart disease in her father and mother; Hypertension in her father. Allergies  Allergen Reactions  . Amlodipine Besylate     edema    Review of Systems  Constitutional: Negative for fatigue and unexpected weight change.  Eyes: Negative for visual disturbance.  Respiratory: Negative for cough, chest tightness, shortness of breath and wheezing.   Cardiovascular: Negative for chest pain, palpitations and leg swelling.  Endocrine: Negative for polydipsia and polyuria.  Genitourinary: Negative for dysuria.  Neurological: Negative for dizziness, seizures, syncope, weakness, light-headedness and headaches.  Psychiatric/Behavioral: Negative for dysphoric mood.       Objective:   Physical Exam  Constitutional: She appears well-developed and well-nourished.  Eyes: Pupils are equal, round, and reactive to light.  Neck: Neck supple. No JVD present. No thyromegaly present.  Cardiovascular: Normal rate and regular rhythm.  Exam reveals no gallop.   Pulmonary/Chest: Effort normal and breath sounds normal. No respiratory distress. She has no wheezes. She has no rales.  Musculoskeletal: She exhibits no edema.  Neurological: She is alert.       Assessment:     #1 type 2 diabetes with complication of chronic kidney disease and on chronic insulin. Generally well-controlled.  A1C today improved at 6.5%.  #2 hypertension stable  #3 chronic kidney disease followed by nephrology  #4 atrial fibrillation on chronic Coumadin    Plan:     -Flu vaccine given -Check hemoglobin A1c -Routine follow-up in 6 months  and repeat lipids then -Reduce Levemir to 10 units once nightly  Yolanda Post MD [facility]

## 2016-05-09 ENCOUNTER — Ambulatory Visit (INDEPENDENT_AMBULATORY_CARE_PROVIDER_SITE_OTHER): Payer: Medicare Other | Admitting: *Deleted

## 2016-05-09 DIAGNOSIS — I482 Chronic atrial fibrillation, unspecified: Secondary | ICD-10-CM

## 2016-05-10 NOTE — Progress Notes (Signed)
Remote pacemaker transmission.   

## 2016-05-20 LAB — CUP PACEART REMOTE DEVICE CHECK
Battery Remaining Longevity: 17 mo
Battery Voltage: 2.73 V
Implantable Lead Implant Date: 20090116
Implantable Lead Location: 753859
Implantable Lead Location: 753860
Implantable Lead Model: 5076
Implantable Pulse Generator Implant Date: 20090116
Lead Channel Impedance Value: 541 Ohm
Lead Channel Pacing Threshold Amplitude: 1.5 V
Lead Channel Pacing Threshold Pulse Width: 0.4 ms
Lead Channel Setting Pacing Amplitude: 3 V
MDC IDC LEAD IMPLANT DT: 20090116
MDC IDC MSMT BATTERY IMPEDANCE: 3116 Ohm
MDC IDC MSMT LEADCHNL RA IMPEDANCE VALUE: 67 Ohm
MDC IDC SESS DTM: 20171113142338
MDC IDC SET LEADCHNL RV PACING PULSEWIDTH: 0.4 ms
MDC IDC SET LEADCHNL RV SENSING SENSITIVITY: 2 mV
MDC IDC STAT BRADY RV PERCENT PACED: 99 %

## 2016-05-23 ENCOUNTER — Other Ambulatory Visit: Payer: Self-pay | Admitting: Family Medicine

## 2016-05-23 DIAGNOSIS — Z1231 Encounter for screening mammogram for malignant neoplasm of breast: Secondary | ICD-10-CM

## 2016-05-30 ENCOUNTER — Ambulatory Visit (INDEPENDENT_AMBULATORY_CARE_PROVIDER_SITE_OTHER): Payer: Medicare Other | Admitting: General Practice

## 2016-05-30 DIAGNOSIS — Z5181 Encounter for therapeutic drug level monitoring: Secondary | ICD-10-CM | POA: Diagnosis not present

## 2016-05-30 LAB — POCT INR: INR: 3.1

## 2016-05-30 NOTE — Patient Instructions (Signed)
Pre visit review using our clinic review tool, if applicable. No additional management support is needed unless otherwise documented below in the visit note. 

## 2016-06-09 ENCOUNTER — Other Ambulatory Visit: Payer: Self-pay | Admitting: Family Medicine

## 2016-06-10 ENCOUNTER — Other Ambulatory Visit: Payer: Self-pay | Admitting: Family Medicine

## 2016-06-20 ENCOUNTER — Other Ambulatory Visit: Payer: Self-pay | Admitting: Family Medicine

## 2016-07-06 ENCOUNTER — Ambulatory Visit
Admission: RE | Admit: 2016-07-06 | Discharge: 2016-07-06 | Disposition: A | Discharge status: Home / Self Care | Payer: Medicare Other | Source: Ambulatory Visit | Attending: Family Medicine | Admitting: Family Medicine

## 2016-07-06 DIAGNOSIS — Z1231 Encounter for screening mammogram for malignant neoplasm of breast: Secondary | ICD-10-CM

## 2016-07-07 ENCOUNTER — Other Ambulatory Visit: Payer: Self-pay | Admitting: Family Medicine

## 2016-07-07 DIAGNOSIS — R928 Other abnormal and inconclusive findings on diagnostic imaging of breast: Secondary | ICD-10-CM

## 2016-07-11 ENCOUNTER — Ambulatory Visit: Payer: Medicare Other

## 2016-07-18 ENCOUNTER — Ambulatory Visit (INDEPENDENT_AMBULATORY_CARE_PROVIDER_SITE_OTHER): Payer: Medicare Other | Admitting: General Practice

## 2016-07-18 DIAGNOSIS — Z5181 Encounter for therapeutic drug level monitoring: Secondary | ICD-10-CM | POA: Diagnosis not present

## 2016-07-18 LAB — POCT INR: INR: 2.2

## 2016-07-18 NOTE — Patient Instructions (Signed)
Pre visit review using our clinic review tool, if applicable. No additional management support is needed unless otherwise documented below in the visit note. 

## 2016-07-27 ENCOUNTER — Ambulatory Visit
Admission: RE | Admit: 2016-07-27 | Discharge: 2016-07-27 | Disposition: A | Discharge status: Home / Self Care | Payer: Medicare Other | Source: Ambulatory Visit | Attending: Family Medicine | Admitting: Family Medicine

## 2016-07-27 ENCOUNTER — Other Ambulatory Visit: Payer: Self-pay | Admitting: Family Medicine

## 2016-07-27 DIAGNOSIS — R928 Other abnormal and inconclusive findings on diagnostic imaging of breast: Secondary | ICD-10-CM

## 2016-07-29 ENCOUNTER — Ambulatory Visit
Admission: RE | Admit: 2016-07-29 | Discharge: 2016-07-29 | Disposition: A | Discharge status: Home / Self Care | Payer: Medicare Other | Source: Ambulatory Visit | Attending: Family Medicine | Admitting: Family Medicine

## 2016-07-29 DIAGNOSIS — R928 Other abnormal and inconclusive findings on diagnostic imaging of breast: Secondary | ICD-10-CM

## 2016-07-29 HISTORY — PX: BREAST BIOPSY: SHX20

## 2016-08-08 ENCOUNTER — Ambulatory Visit (INDEPENDENT_AMBULATORY_CARE_PROVIDER_SITE_OTHER): Payer: Medicare Other | Admitting: *Deleted

## 2016-08-08 DIAGNOSIS — I482 Chronic atrial fibrillation, unspecified: Secondary | ICD-10-CM

## 2016-08-08 DIAGNOSIS — Z950 Presence of cardiac pacemaker: Secondary | ICD-10-CM

## 2016-08-09 ENCOUNTER — Encounter: Payer: Self-pay | Admitting: Cardiology

## 2016-08-09 LAB — CUP PACEART REMOTE DEVICE CHECK
Battery Impedance: 3202 Ohm
Battery Remaining Longevity: 17 mo
Date Time Interrogation Session: 20180212164238
Implantable Lead Implant Date: 20090116
Implantable Lead Model: 5076
Implantable Pulse Generator Implant Date: 20090116
Lead Channel Impedance Value: 67 Ohm
Lead Channel Pacing Threshold Amplitude: 1.375 V
Lead Channel Pacing Threshold Pulse Width: 0.4 ms
Lead Channel Setting Pacing Amplitude: 2.75 V
MDC IDC LEAD IMPLANT DT: 20090116
MDC IDC LEAD LOCATION: 753859
MDC IDC LEAD LOCATION: 753860
MDC IDC MSMT BATTERY VOLTAGE: 2.73 V
MDC IDC MSMT LEADCHNL RV IMPEDANCE VALUE: 505 Ohm
MDC IDC SET LEADCHNL RV PACING PULSEWIDTH: 0.4 ms
MDC IDC SET LEADCHNL RV SENSING SENSITIVITY: 2 mV
MDC IDC STAT BRADY RV PERCENT PACED: 99 %

## 2016-08-09 NOTE — Progress Notes (Signed)
Remote pacemaker transmission.   

## 2016-08-10 ENCOUNTER — Encounter: Payer: Self-pay | Admitting: Oncology

## 2016-08-10 ENCOUNTER — Telehealth: Payer: Self-pay | Admitting: Internal Medicine

## 2016-08-10 ENCOUNTER — Telehealth: Payer: Self-pay | Admitting: Oncology

## 2016-08-10 NOTE — Telephone Encounter (Signed)
Appt has been scheduled for the pt to see Dr. Jana Hakim and have genetic counseling on 2/27 beginning at 2pm. Pt is aware to arrive at 130pm so that she can be checked in on time. She agreed to the appt date and time. Gave her my contact information in case she needs to reschedule. Demographics verified. Letter mailed.

## 2016-08-10 NOTE — Telephone Encounter (Signed)
New Message:    Pt's daughter wants to know if pt have received surgical clearance from Dr Binnie Rail office?She wants to know if pt needs to see Dr Lovena Le or have some test before she  Can be cleared for surgery?

## 2016-08-11 NOTE — Telephone Encounter (Signed)
Called, spoke with pt's daughter, Becky Sax. Informed will review clearance documents with Dr. Lovena Le tomorrow. Daughter verbalized understanding.

## 2016-08-12 ENCOUNTER — Telehealth: Payer: Self-pay | Admitting: *Deleted

## 2016-08-12 ENCOUNTER — Other Ambulatory Visit: Payer: Self-pay | Admitting: Oncology

## 2016-08-12 DIAGNOSIS — I5043 Acute on chronic combined systolic (congestive) and diastolic (congestive) heart failure: Secondary | ICD-10-CM

## 2016-08-12 NOTE — Telephone Encounter (Signed)
Mailed New Patient packet to pt.

## 2016-08-12 NOTE — Telephone Encounter (Signed)
Faxed clearance documents to [facility] surgery [phone]. Dr. Lovena Le stated "May hold warfarin 5 days prior. No bridge indicated. Low surgical risk. She does not need pacemaker reprogrammed during surgery".

## 2016-08-12 NOTE — Telephone Encounter (Signed)
Called, spoke with pt's daughter, Becky Sax. Informed clearance documents have been faxed.

## 2016-08-18 ENCOUNTER — Ambulatory Visit: Payer: Self-pay | Admitting: General Surgery

## 2016-08-18 ENCOUNTER — Encounter: Payer: Self-pay | Admitting: Genetic Counselor

## 2016-08-18 DIAGNOSIS — D0512 Intraductal carcinoma in situ of left breast: Secondary | ICD-10-CM

## 2016-08-22 ENCOUNTER — Ambulatory Visit (INDEPENDENT_AMBULATORY_CARE_PROVIDER_SITE_OTHER): Payer: Medicare Other

## 2016-08-22 DIAGNOSIS — Z5181 Encounter for therapeutic drug level monitoring: Secondary | ICD-10-CM

## 2016-08-22 LAB — POCT INR: INR: 2.3

## 2016-08-22 NOTE — Progress Notes (Signed)
I agree with this plan.

## 2016-08-22 NOTE — Patient Instructions (Signed)
Pre visit review using our clinic review tool, if applicable. No additional management support is needed unless otherwise documented below in the visit note. 

## 2016-08-23 ENCOUNTER — Ambulatory Visit (HOSPITAL_BASED_OUTPATIENT_CLINIC_OR_DEPARTMENT_OTHER): Payer: Medicare Other | Admitting: Oncology

## 2016-08-23 ENCOUNTER — Other Ambulatory Visit (HOSPITAL_BASED_OUTPATIENT_CLINIC_OR_DEPARTMENT_OTHER): Payer: Medicare Other

## 2016-08-23 ENCOUNTER — Ambulatory Visit (HOSPITAL_BASED_OUTPATIENT_CLINIC_OR_DEPARTMENT_OTHER): Payer: Self-pay

## 2016-08-23 ENCOUNTER — Encounter: Payer: Self-pay | Admitting: Oncology

## 2016-08-23 ENCOUNTER — Ambulatory Visit: Payer: Medicare Other | Admitting: Oncology

## 2016-08-23 ENCOUNTER — Encounter: Payer: Self-pay | Admitting: *Deleted

## 2016-08-23 VITALS — BP 138/62 | HR 88 | Temp 98.6°F | Resp 18 | Ht 60.5 in | Wt 152.0 lb

## 2016-08-23 DIAGNOSIS — Z87891 Personal history of nicotine dependence: Secondary | ICD-10-CM | POA: Diagnosis not present

## 2016-08-23 DIAGNOSIS — M8580 Other specified disorders of bone density and structure, unspecified site: Secondary | ICD-10-CM

## 2016-08-23 DIAGNOSIS — Z170 Estrogen receptor positive status [ER+]: Secondary | ICD-10-CM | POA: Diagnosis not present

## 2016-08-23 DIAGNOSIS — D0512 Intraductal carcinoma in situ of left breast: Secondary | ICD-10-CM

## 2016-08-23 DIAGNOSIS — Z803 Family history of malignant neoplasm of breast: Secondary | ICD-10-CM

## 2016-08-23 DIAGNOSIS — I5043 Acute on chronic combined systolic (congestive) and diastolic (congestive) heart failure: Secondary | ICD-10-CM

## 2016-08-23 DIAGNOSIS — Z315 Encounter for genetic counseling: Secondary | ICD-10-CM

## 2016-08-23 DIAGNOSIS — Z809 Family history of malignant neoplasm, unspecified: Secondary | ICD-10-CM

## 2016-08-23 LAB — COMPREHENSIVE METABOLIC PANEL
ALK PHOS: 56 U/L (ref 40–150)
ALT: 16 U/L (ref 0–55)
ANION GAP: 12 meq/L — AB (ref 3–11)
AST: 22 U/L (ref 5–34)
Albumin: 3.7 g/dL (ref 3.5–5.0)
BUN: 70.4 mg/dL — ABNORMAL HIGH (ref 7.0–26.0)
CALCIUM: 10.6 mg/dL — AB (ref 8.4–10.4)
CHLORIDE: 100 meq/L (ref 98–109)
CO2: 24 mEq/L (ref 22–29)
Creatinine: 2.8 mg/dL — ABNORMAL HIGH (ref 0.6–1.1)
EGFR: 16 mL/min/{1.73_m2} — AB (ref >90)
Glucose: 148 mg/dl — ABNORMAL HIGH (ref 70–140)
POTASSIUM: 3.9 meq/L (ref 3.5–5.1)
Sodium: 137 mEq/L (ref 136–145)
Total Bilirubin: 0.65 mg/dL (ref 0.20–1.20)
Total Protein: 8.2 g/dL (ref 6.4–8.3)

## 2016-08-23 LAB — CBC WITH DIFFERENTIAL/PLATELET
BASO%: 0.5 % (ref 0.0–2.0)
BASOS ABS: 0 10*3/uL (ref 0.0–0.1)
EOS ABS: 0.1 10*3/uL (ref 0.0–0.5)
EOS%: 1.6 % (ref 0.0–7.0)
HEMATOCRIT: 37 % (ref 34.8–46.6)
HGB: 12.6 g/dL (ref 11.6–15.9)
LYMPH#: 1.6 10*3/uL (ref 0.9–3.3)
LYMPH%: 19.5 % (ref 14.0–49.7)
MCH: 29.1 pg (ref 25.1–34.0)
MCHC: 34.1 g/dL (ref 31.5–36.0)
MCV: 85.5 fL (ref 79.5–101.0)
MONO#: 0.7 10*3/uL (ref 0.1–0.9)
MONO%: 8.2 % (ref 0.0–14.0)
NEUT#: 5.7 10*3/uL (ref 1.5–6.5)
NEUT%: 70.2 % (ref 38.4–76.8)
Platelets: 199 10*3/uL (ref 145–400)
RBC: 4.33 10*6/uL (ref 3.70–5.45)
RDW: 14.6 % — ABNORMAL HIGH (ref 11.2–14.5)
WBC: 8.1 10*3/uL (ref 3.9–10.3)

## 2016-08-23 NOTE — Progress Notes (Signed)
[facility]  [phone] [phone]) [phone]     ID: Yolanda Huffman DOB: 02/05/1941  MR#: [id number]  HUD#:[id number]  Patient Care Team: Eulas Post, MD as PCP - General (Family Medicine) Donato Heinz, MD as Consulting Physician (Nephrology) Chauncey Cruel, MD as Consulting Physician (Oncology) Autumn Messing III, MD as Consulting Physician (General Surgery) Chauncey Cruel, MD OTHER MD:  CHIEF COMPLAINT: Ductal carcinoma in situ   CURRENT TREATMENT: Awaiting definitive surgery   BREAST CANCER HISTORY: The patient had routine bilateral screening mammography with tomography at the [facility] 07/06/2016, showing new calcifications in the left breast. The right breast was unremarkable. Unilateral left diagnostic mammography 07/27/2016 found the breast density to be category B. In the upper inner quadrant of the left breast there was a 0.7 cm group of pleomorphic calcifications.  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  Her subsequent history is as detailed below  INTERVAL HISTORY: Yolanda Huffman was evaluated in the breast clinic 08/23/2016 accompanied by her daughter Yolanda Huffman. Her case was also presented in the multidisciplinary breast cancer conference 08/10/2016. At that time a preliminary plan was proposed: Consideration of the COMET trial, breast conserving surgery, anti-estrogens. Radiation is also a reasonable treatment but her pacemaker would have to be moved.  REVIEW OF SYSTEMS: There were no specific symptoms leading to the original mammogram, which was routinely scheduled. The patient denies unusual headaches, visual changes, nausea, vomiting, stiff neck, dizziness, or gait imbalance. There has been no cough, phlegm production, or pleurisy, no chest pain or pressure, and no change in bowel or  bladder habits. The patient denies fever, rash, bleeding, unexplained fatigue or unexplained weight loss. She complains of easy bruising, arthritic pains here and there which are not more intense or persistent than before, and problems with her blood sugar which she tells me currently are well controlled. A detailed review of systems was otherwise entirely negative.  PAST MEDICAL HISTORY: Past Medical History:  Diagnosis Date  . Anemia   . Anemia   . Atrial fibrillation ([place])    prior Sotalol - d/Huffman'd 2/2 increased creatinine; rate control strategy  . CAD (coronary artery disease)    s/p PCI in [place] in 1/09  . Chronic combined systolic and diastolic heart failure (HCC)    Echocardiogram 3/12: Mild LVH, EF 85-88%, normal diastolic function, mild AI, mild MR, PASP 44, normal wall motion  . CKD (chronic kidney disease)   . Diabetes mellitus   . Hyperlipemia   . Hypertension   . MI, old    2009  . Osteoporosis   . Pacemaker   . Pleural effusion   . Pneumonia   . PUD (peptic ulcer disease) 10/2010   duodenal ulcer    PAST SURGICAL HISTORY: Past Surgical History:  Procedure Laterality Date  . CATARACT EXTRACTION Bilateral    01/12/09 and 01/26/09 both eyes  . PACEMAKER INSERTION  07/13/07  . stent implant  07/13/2007    FAMILY HISTORY Family History  Problem Relation Age of Onset  . Heart disease Father   . Heart attack Father   . Clotting disorder Father     blood clot  . Hypertension Father   . Heart disease Mother   . Breast cancer Maternal Aunt 61  . Breast cancer Sister 1  . Breast cancer Other 40  . Colon cancer Neg Hx  The patient's father died at age 72 from a myocardial infarction. The patient's mother died from multiple problems including pleurisy at the age of 76. The patient has 3 brothers, 2 sisters. One sister was diagnosed with breast cancer in her late 60s. The other sister had a "vaginal cancer" (? Cervical cancer). A maternal aunt was diagnosed with breast  cancer in her 50s and her daughter, the patient's niece was diagnosed with breast cancer in her 30s.  GYNECOLOGIC HISTORY:  No LMP recorded. Patient is postmenopausal. Menarche age 12, first live birth age 26, the patient is GX P1. She stopped having periods in 1992. She did not take hormone replacement. She took oral contraceptives briefly remotely with no complications.  SOCIAL HISTORY:  She is originally from [place]. 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 She is a widow. She lives with her Yolanda Huffman in a retirement community. Her daughter Yolanda Huffman lives in [place]. 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 The patient has no grandchildren. She is not a church attender    ADVANCED DIRECTIVES: The patient's daughter Yolanda is her healthcare power of attorney. Yolanda Huffman can be reached at [phone] or [phone]   HEALTH MAINTENANCE: Social History  Substance Use Topics  . Smoking status: Former Smoker    Packs/day: 2.00    Years: 20.00    Types: Cigarettes    Quit date: 11/26/1991  . Smokeless tobacco: Never Used  . Alcohol use No     Colonoscopy:January 2017/ lobe our  PAP:  Bone density: Remote   Allergies  Allergen Reactions  . Amlodipine Besylate     edema    Current Outpatient Prescriptions  Medication Sig Dispense Refill  . atorvastatin (LIPITOR) 40 MG tablet Take 1 tablet (40 mg total) by mouth daily. 30 tablet 11  . calcitRIOL (ROCALTROL) 0.25 MCG capsule Take 0.25 mcg by mouth only on Mon, Weds, Fri    . colchicine 0.6 MG tablet TAKE 1 TABLET (0.6 MG TOTAL) BY MOUTH AS NEEDED AS DIRECTED. 30 tablet 4  . Cranberry 1000 MG CAPS Take 1 capsule by mouth daily.    . fish oil-omega-3 fatty acids 1000 MG capsule Take 1 g by mouth 2 (two) times daily.     . furosemide (LASIX) 40 MG tablet TAKE TWO TABLETS BY MOUTH EVERY MORNING AND TAKE ONE TABLET BY MOUTH EVERY EVENING 270 tablet 1  . glucose blood test strip 1 each by Other route 2 (two) times daily. Use as instructed     .  insulin detemir (LEVEMIR) 100 UNIT/ML injection Inject 12 Units into the skin at bedtime. And on sliding scale    . LEVEMIR FLEXTOUCH 100 UNIT/ML Pen INJECT 5 UNITS INTO THE SKIN AT BEDTIME AS DIRECTED. 15 mL 3  . metolazone (ZAROXOLYN) 2.5 MG tablet TAKE 1 TABLET ON M, W, FRI. ONLY; TAKE THIS WITH YOUR LASIX; YOU WILL ALSO NEED TO TAKE AN EXTRA POTASSIUM ON THESE DAYS AS WELL 27 tablet 4  . metoprolol succinate (TOPROL-XL) 25 MG 24 hr tablet TAKE 2 TABLETS BY MOUTH EVERY MORNING AND THEN ONE TABLET BY MOUTH EVERY EVENING 90 tablet 3  . Multiple Vitamin (MULTIVITAMIN) capsule Take 1 capsule by mouth daily.    . pantoprazole (PROTONIX) 40 MG tablet TAKE 1 TABLET (40 MG TOTAL) BY MOUTH DAILY. NEED TO MAKE AN APPOINTMENT. 30 tablet 11  . Potassium Chloride ER 20 MEQ TBCR TAKE 2 TABLETS (40 MEQ TOTAL) BY MOUTH 2 (TWO) TIMES DAILY. 120 tablet 10  .   potassium chloride SA (K-DUR,KLOR-CON) 20 MEQ tablet Take 2 tablets (40 mEq total) by mouth 2 (two) times daily. 120 tablet 11  . warfarin (COUMADIN) 5 MG tablet TAKE AS DIRECTED BY ANTICOAGULATION CLINIC 30 tablet 5   No current facility-administered medications for this visit.     OBJECTIVE: Elderly white woman in no acute distress Vitals:   08/23/16 1521  BP: 138/62  Pulse: 88  Resp: 18  Temp: 98.6 F (37 Huffman)     Body mass index is 29.2 kg/m.    ECOG FS:1 - Symptomatic but completely ambulatory  Ocular: Sclerae unicteric, EOMs intact Ear-nose-throat: Oropharynx clear and moist, dentures in place Lymphatic: No cervical or supraclavicular adenopathy Lungs no rales or rhonchi Heart: Regular rate and rhythm Abd soft, obese, nontender, positive bowel sounds MSK no focal spinal tenderness, no joint edema Neuro: non-focal, well-oriented, appropriate affect Breasts: I do not palpate a mass in either breast. There are no skin or nipple changes of concern. Both axillae are benign.  LAB RESULTS:  CMP     Component Value Date/Time   NA 137  08/23/2016 1452   K 3.9 08/23/2016 1452   CL 96 08/04/2015 0904   CO2 24 08/23/2016 1452   GLUCOSE 148 (H) 08/23/2016 1452   BUN 70.4 (H) 08/23/2016 1452   CREATININE 2.8 (H) 08/23/2016 1452   CALCIUM 10.6 (H) 08/23/2016 1452   PROT 8.2 08/23/2016 1452   ALBUMIN 3.7 08/23/2016 1452   AST 22 08/23/2016 1452   ALT 16 08/23/2016 1452   ALKPHOS 56 08/23/2016 1452   BILITOT 0.65 08/23/2016 1452   GFRNONAA 16 (L) 09/22/2010 0519   GFRAA (L) 09/22/2010 0519    19        The eGFR has been calculated using the MDRD equation. This calculation has not been validated in all clinical situations. eGFR's persistently <60 mL/min signify possible Chronic Kidney Disease.    INo results found for: SPEP, UPEP  Lab Results  Component Value Date   WBC 8.1 08/23/2016   NEUTROABS 5.7 08/23/2016   HGB 12.6 08/23/2016   HCT 37.0 08/23/2016   MCV 85.5 08/23/2016   PLT 199 08/23/2016      Chemistry      Component Value Date/Time   NA 137 08/23/2016 1452   K 3.9 08/23/2016 1452   CL 96 08/04/2015 0904   CO2 24 08/23/2016 1452   BUN 70.4 (H) 08/23/2016 1452   CREATININE 2.8 (H) 08/23/2016 1452      Component Value Date/Time   CALCIUM 10.6 (H) 08/23/2016 1452   ALKPHOS 56 08/23/2016 1452   AST 22 08/23/2016 1452   ALT 16 08/23/2016 1452   BILITOT 0.65 08/23/2016 1452       No results found for: LABCA2  No components found for: LABCA125   Recent Labs Lab 08/22/16  INR 2.3    Urinalysis    Component Value Date/Time   COLORURINE LT. YELLOW 07/01/2011 [place] 07/01/2011 1203   LABSPEC 1.010 07/01/2011 1203   PHURINE 6.0 07/01/2011 1203   GLUCOSEU NEGATIVE 07/01/2011 1203   HGBUR TRACE-LYSED 07/01/2011 1203   BILIRUBINUR neg 05/02/2013 1215   KETONESUR NEGATIVE 07/01/2011 1203   PROTEINUR 1 05/02/2013 1215   PROTEINUR NEGATIVE 09/15/2010 0449   UROBILINOGEN 0.2 05/02/2013 1215   UROBILINOGEN 0.2 07/01/2011 1203   NITRITE neg 05/02/2013 1215   NITRITE  NEGATIVE 07/01/2011 1203   LEUKOCYTESUR moderate (2+) 05/02/2013 1215     STUDIES: Mm Digital Diagnostic Unilat  L  Result Date: 07/27/2016 CLINICAL DATA:  Screening recall for left breast calcifications. EXAM: DIGITAL DIAGNOSTIC LEFT MAMMOGRAM WITH CAD COMPARISON:  Previous exam(s). ACR Breast Density Category b: There are scattered areas of fibroglandular density. FINDINGS: In the upper inner quadrant of the left breast, middle to posterior depth there is a 7 mm group of pleomorphic calcifications. Mammographic images were processed with CAD. IMPRESSION: There is an indeterminate group of pleomorphic calcifications in the upper inner left breast. RECOMMENDATION: Stereotactic biopsy is recommended for the left breast calcifications. The patient states she will need to call us back for scheduling in order to arrange transportation. I have discussed the findings and recommendations with the patient. Results were also provided in writing at the conclusion of the visit. If applicable, a reminder letter will be sent to the patient regarding the next appointment. BI-RADS CATEGORY  4: Suspicious. Electronically Signed   By: Ammie Ferrier M.D.   On: 07/27/2016 10:50   Mm Clip Placement Left  Result Date: 07/29/2016 CLINICAL DATA:  Evaluate clip placement following stereotactic guided left breast biopsy. EXAM: DIAGNOSTIC LEFT MAMMOGRAM POST STEREOTACTIC BIOPSY COMPARISON:  Previous exam(s). FINDINGS: Mammographic images were obtained following stereotactic guided biopsy of calcifications within the upper inner left breast. The X shaped clip is in satisfactory position. A small amount of post biopsy hemorrhage is noted. IMPRESSION: Satisfactory clip position following stereotactic guided left breast biopsy. Small amount of post biopsy hemorrhage. Final Assessment: Post Procedure Mammograms for Marker Placement Electronically Signed   By: Margarette Canada M.D.   On: 07/29/2016 14:37   Mm Lt Breast Bx W Loc Dev 1st  Lesion Image Bx Spec Stereo Guide  Addendum Date: 08/01/2016   ADDENDUM REPORT: 08/01/2016 14:46 ADDENDUM: Pathology revealed INTERMEDIATE GRADE DUCTAL CARCINOMA IN SITU WITH CALCIFICATIONS of the upper inner Left breast. This was found to be concordant by Dr. Hassan Rowan. Pathology results were discussed with the patient by telephone. The patient reported doing well after the biopsy with tenderness at the site. Post biopsy instructions and care were reviewed and questions were answered. The patient was encouraged to call [facility] for any additional concerns. Surgical consultation has been arranged with Dr. Autumn Messing at [facility] on August 05, 2016. Pathology results reported by Terie Purser, RN on 08/01/2016. Electronically Signed   By: Margarette Canada M.D.   On: 08/01/2016 14:46   Result Date: 08/01/2016 CLINICAL DATA:  76 year old female for tissue sampling of indeterminate calcifications within the upper inner left breast. EXAM: LEFT BREAST STEREOTACTIC CORE NEEDLE BIOPSY COMPARISON:  Previous exams. FINDINGS: The patient and I discussed the procedure of stereotactic-guided biopsy including benefits and alternatives. We discussed the high likelihood of a successful procedure. We discussed the risks of the procedure including infection, bleeding, tissue injury, clip migration, and inadequate sampling. Informed written consent was given. The usual time out protocol was performed immediately prior to the procedure. Using sterile technique and 1% Lidocaine as local anesthetic, under stereotactic guidance, a 9 gauge vacuum assisted needle device was used to perform core needle biopsy of calcifications within the upper inner quadrant of the left breast using a superior approach. Specimen radiograph was performed showing calcifications. Specimens with calcifications are identified for pathology. At the conclusion of the procedure, a X shaped tissue marker clip was deployed into  the biopsy cavity. Follow-up 2-view mammogram was performed and dictated separately. IMPRESSION: Stereotactic-guided biopsy of upper inner left breast calcifications. No apparent complications. Pathology will be followed.  Electronically Signed: By: Margarette Canada M.D. On: 07/29/2016 14:38    ELIGIBLE FOR AVAILABLE RESEARCH PROTOCOL: COMET/ discussed 08/23/2016; patient declines participation  ASSESSMENT: 76 y.o. Yolanda Huffman woman status post left breast upper inner quadrant biopsy 07/29/2016 for ductal carcinoma in situ, grade 2, estrogen and progesterone receptor positive  (1) Genetics testing 08/23/2016: results pending  (2) considered the COMET trial: Patient declines  (3) breast conserving surgery planned  (4) advised against adjuvant radiation since there would be no survival advantage in this setting and the patient's pacemaker would have to be moved  (5) anti-estrogens at surgical recovery: Bone density pending  PLAN: We spent the better part of today's 50 minute appointment discussing the biology of breast cancer in general, and the specifics of the patient's tumor in particular.Yolanda Huffman understands that in noninvasive ductal carcinoma, also called ductal carcinoma in situ ("DCIS") the breast cancer cells remain trapped in the ducts were they started. They cannot travel to a vital organ. For that reason these cancers in themselves are not life-threatening.  We discussed the fact that many experts currently feel that ductal carcinoma in situ is not "really" a cancer and that surgery may not be necessary in all cases. We have a trial currently open, COMET, exploring that possibility. I went over the trial with Yolanda Huffman and her daughter. At this point however they declined to participate.  We also discussed the fact that Yolanda Huffman qualifies for genetic testing in fact she had a ready had her blood drawn for genetics before she met with me today.In patients who carry a deleterious mutation  the risk of a  new breast cancer developing in the future may be sufficiently great that the patient may choose bilateral mastectomies. However if she wishes to keep her breasts in that situation it is safe to do so. That would require intensified screening, which generally means not only yearly mammography but a yearly breast MRI as well. after this discussion Yolanda Huffman made it clear that even if she carried a BRCA or other deleterious mutation she would opt for intensified screening. Accordingly there is no need to wait for the genetics test results before proceeding to her definitive surgery.  If the whole breast is removed then all the ducts are removed and since the cancer cells are trapped in the ducts, the cure rate with mastectomy for noninvasive breast cancer is approximately 99%. For many years mastectomy was standard, but now we recommend lumpectomy, because there is no survival advantage to mastectomy and because the cosmetic result is generally superior with breast conservation. Also in this patient the surgical risk is much less with lumpectomy. There is no indication for sentinel node mapping. The patient is agreeable to this approach.  Since the patient is keeping her breast, there will be some small  risk of recurrence. The recurrence can only be in the same breast since, again, the cells are trapped in the ducts. There is no connection from one breast to the other. The risk of local recurrence would be cut by more than half with radiation, which is frequently used in this situation. However we have level I data that bypassing radiation in patients like Yolanda Huffman does not affect survival. Because if the patient were to receive radiation we would have to reposition her pacemaker--another surgical procedure--I recommend against radiation in her case. Instead we will proceed directly to anti-estrogens after surgery  Specifically we will consider aromatase inhibitors. They will reduce the risk of local recurrence by one  half. In  addition anti-estrogens will lower the risk of a new breast cancer developing in either breast, also by one half.   Accordingly the overall plan is for surgery, followed by anastrozole. I will plan to see Yolanda Huffman again in approximately 5 weeks, by which time we should have both the surgical and genetics results in hand. I will obtain a bone density before that visit so we can discuss options regarding osteoporosis prophylaxis.  Kyleeann has a good understanding of the overall plan. She agrees with it. She knows the goal of treatment in her case is cure. She will call with any problems that may develop before her next visit here.  MAGRINAT,GUSTAV C, MD   08/23/2016 5:57 PM Medical Oncology and Hematology [facility] [address] [place],  [address] Tel. [phone]    Fax. [phone]   

## 2016-08-23 NOTE — Progress Notes (Signed)
REFERRING PROVIDER: Autumn Messing, MD  PRIMARY PROVIDER:  Eulas Post, MD  PRIMARY REASON FOR VISIT:  1. Intraductal carcinoma in situ of left breast   2. Family history of malignant neoplasm of breast    HISTORY OF PRESENT ILLNESS:   [name], a 76 y.o. female, was seen for a [facility] cancer genetics consultation at the request of Dr. Marlou Starks due to a personal and family history of cancer.  [name] presents to clinic today to discuss the possibility of a hereditary predisposition to cancer, genetic testing, and to further clarify her future cancer risks, as well as potential cancer risks for family members. She accompanied to the appointment by her daughter, Yolanda Huffman.  In February 2018, at the age of 61, [name] was diagnosed with ductal carcinoma in situ of the left breast. This will be treated with lumpectomy and anti-hormone therapy once surgery is cleared as acceptable from her other providers.   CANCER HISTORY:   No history exists.    HORMONAL RISK FACTORS:  OCP use for approximately 4 years.  Ovaries intact: yes.  Hysterectomy: no.  Menopausal status: postmenopausal.  HRT use: 0 years. Colonoscopy: yes; normal. History of one polyp. She has been told she does not need to continue screening for colon cancer. Mammogram within the last year: yes. Number of breast biopsies: 1. Up to date with pelvic exams:  yes. Any excessive radiation exposure in the past:  no  Past Medical History:  Diagnosis Date  . Anemia   . Anemia   . Atrial fibrillation ([place])    prior Sotalol - d/c'd 2/2 increased creatinine; rate control strategy  . CAD (coronary artery disease)    s/p PCI in [place] in 1/09  . Chronic combined systolic and diastolic heart failure (HCC)    Echocardiogram 3/12: Mild LVH, EF 12-19%, normal diastolic function, mild AI, mild MR, PASP 44, normal wall motion  . CKD (chronic kidney disease)   . Diabetes mellitus   . Hyperlipemia   . Hypertension   . MI, old    2009  . Osteoporosis   . Pacemaker   . Pleural effusion   . Pneumonia   . PUD (peptic ulcer disease) 10/2010   duodenal ulcer    Past Surgical History:  Procedure Laterality Date  . CATARACT EXTRACTION Bilateral    01/12/09 and 01/26/09 both eyes  . PACEMAKER INSERTION  07/13/07  . stent implant  07/13/2007    Social History   Social History  . Marital status: Widowed    Spouse name: N/A  . Number of children: 1  . Years of education: N/A   Occupational History  . retired [employer] Retired   Social History Main Topics  . Smoking status: Former Smoker    Packs/day: 2.00    Years: 20.00    Types: Cigarettes    Quit date: 06/28/1991  . Smokeless tobacco: Never Used  . Alcohol use No  . Drug use: No  . Sexual activity: Not on file   Other Topics Concern  . Not on file   Social History Narrative   Reg exercise           FAMILY HISTORY:  We obtained a detailed, 4-generation family history.  Significant diagnoses are listed below: Family History  Problem Relation Age of Onset  . Heart disease Father   . Heart attack Father   . Clotting disorder Father     blood clot  . Hypertension Father   . Heart  disease Mother   . Breast cancer Maternal Aunt 73  . Breast cancer Sister 5  . Breast cancer Other 40  . Colon cancer Neg Hx     [name] is unaware of previous family history of genetic testing for hereditary cancer risks. Patient's ancestors are of [place], [place] and European descent. There is no reported Ashkenazi Jewish ancestry. There is no known consanguinity.  GENETIC COUNSELING ASSESSMENT: Yolanda Huffman is a 76 y.o. female with a personal and family history which is somewhat suggestive of a hereditary predisposition to cancer. We, therefore, discussed and recommended the following at today's visit.   DISCUSSION: We reviewed the characteristics, features and inheritance patterns of hereditary cancer syndromes. We also discussed genetic testing, including the  appropriate family members to test, the process of testing, insurance coverage and turn-around-time for results. We discussed the implications of a negative, positive and/or variant of uncertain significant result. We recommended [name] pursue genetic testing for the Multi-Cancer Gene Panel offered by [facility], which includes sequencing and/or deletion duplication testing of the following 80 genes: ALK, APC, ATM, AXIN2,BAP1,  BARD1, BLM, BMPR1A, BRCA1, BRCA2, BRIP1, CASR, CDC73, CDH1, CDK4, CDKN1B, CDKN1C, CDKN2A (p14ARF), CDKN2A (p16INK4a), CEBPA, CHEK2, DICER1, CIS3L2, EGFR (c.2369C>T, p.Thr790Met variant only), EPCAM (Deletion/duplication testing only), FH, FLCN, GATA2, GPC3, GREM1 (Promoter region deletion/duplication testing only), HOXB13 (c.251G>A, p.Gly84Glu), HRAS, KIT, MAX, MEN1, MET, MITF (c.952G>A, p.Glu318Lys variant only), MLH1, MSH2, MSH6, MUTYH, NBN, NF1, NF2, PALB2, PDGFRA, PHOX2B, PMS2, POLD1, POLE, POT1, PRKAR1A, PTCH1, PTEN, RAD50, RAD51C, RAD51D, RB1, RECQL4, RET, RUNX1, SDHAF2, SDHA (sequence changes only), SDHB, SDHC, SDHD, SMAD4, SMARCA4, SMARCB1, SMARCE1, STK11, SUFU, TERT, TERT, TMEM127, TP53, TSC1, TSC2, VHL, WRN and WT1.   Based on [name]'s personal and family history of cancer, she meets medical criteria for genetic testing. Despite that she meets criteria, she may still have an out of pocket cost. We discussed that if her out of pocket cost for testing is over $100, the laboratory will call and confirm whether she wants to proceed with testing.  If the out of pocket cost of testing is less than $100 she will be billed by the genetic testing laboratory.   PLAN: After considering the risks, benefits, and limitations, [name]  provided informed consent to pursue genetic testing and the blood sample was sent to [facility] for analysis of the Multi-Cancer Gene panel test. Results should be available within approximately 2-3 weeks' time, at which point they will be  disclosed by telephone to [name], as will any additional recommendations warranted by these results. [name] will receive a summary of her genetic counseling visit and a copy of her results once available. This information will also be available in Epic. We encouraged [name] to remain in contact with cancer genetics annually so that we can continuously update the family history and inform her of any changes in cancer genetics and testing that may be of benefit for her family. [name] questions were answered to her satisfaction today. Our contact information was provided should additional questions or concerns arise.  Lastly, we encouraged [name] to remain in contact with cancer genetics annually so that we can continuously update the family history and inform her of any changes in cancer genetics and testing that may be of benefit for this family.   Ms.  Yolanda Huffman questions were answered to her satisfaction today. Our contact information was provided should additional questions or concerns arise. Thank you for the referral and allowing [place]  to share in the care of your patient.   Yolanda Pike, MS, [facility] Certified Genetic Counselor  The patient was seen for a total of 45 minutes in face-to-face genetic counseling.  This patient was discussed with Drs. Magrinat, Lindi Adie and/or Burr Medico who agrees with the above.    _______________________________________________________________________ For Office Staff:  Number of people involved in session: 3 Was an Intern/ student involved with case: no

## 2016-08-24 ENCOUNTER — Other Ambulatory Visit: Payer: Self-pay | Admitting: Family Medicine

## 2016-09-07 ENCOUNTER — Ambulatory Visit: Payer: Self-pay | Admitting: Genetic Counselor

## 2016-09-07 ENCOUNTER — Telehealth: Payer: Self-pay | Admitting: Genetic Counselor

## 2016-09-07 DIAGNOSIS — D0512 Intraductal carcinoma in situ of left breast: Secondary | ICD-10-CM

## 2016-09-07 DIAGNOSIS — Z803 Family history of malignant neoplasm of breast: Secondary | ICD-10-CM

## 2016-09-07 DIAGNOSIS — Z1379 Encounter for other screening for genetic and chromosomal anomalies: Secondary | ICD-10-CM

## 2016-09-07 NOTE — Telephone Encounter (Signed)
Discussed genetic testing results with Yolanda Huffman. See encounter note for full details.

## 2016-09-07 NOTE — Progress Notes (Signed)
[facility]    Patient Name: Yolanda Huffman Patient DOB: 1940/11/22 Patient Age: 76 y.o. Encounter Date: 09/07/2016  Referring Provider: Autumn Messing, MD  Primary Care Provider: Eulas Post, MD  [name] was called today to discuss genetic test results. Please see the Genetics note from her visit on 08/23/16 for a detailed discussion of her personal and family history.  Genetic Testing: At the time of Yolanda Huffman's visit, we recommended she pursue genetic testing of multiple genes associated with a hereditary predisposition to cancer. Testing included sequencing and deletion/duplication analysis of the following 80 genes on the Multi-Gene Panel offered by [facility]: ALK, APC, ATM, AXIN2,BAP1,  BARD1, BLM, BMPR1A, BRCA1, BRCA2, BRIP1, CASR, CDC73, CDH1, CDK4, CDKN1B, CDKN1C, CDKN2A (p14ARF), CDKN2A (p16INK4a), CEBPA, CHEK2, DICER1, CIS3L2, EGFR (c.2369C>T, p.Thr790Met variant only), EPCAM (Deletion/duplication testing only), FH, FLCN, GATA2, GPC3, GREM1 (Promoter region deletion/duplication testing only), HOXB13 (c.251G>A, p.Gly84Glu), HRAS, KIT, MAX, MEN1, MET, MITF (c.952G>A, p.Glu318Lys variant only), MLH1, MSH2, MSH6, MUTYH, NBN, NF1, NF2, PALB2, PDGFRA, PHOX2B, PMS2, POLD1, POLE, POT1, PRKAR1A, PTCH1, PTEN, RAD50, RAD51C, RAD51D, RB1, RECQL4, RET, RUNX1, SDHAF2, SDHA (sequence changes only), SDHB, SDHC, SDHD, SMAD4, SMARCA4, SMARCB1, SMARCE1, STK11, SUFU, TERT, TERT, TMEM127, TP53, TSC1, TSC2, VHL, WRN and WT1. Testing was normal and did not reveal a cancer-associated mutation in these genes. A copy of the genetic test report will be scanned into Epic under the media tab.  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 This means an  individual needs to carry two copies of this variant to have the condition. 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 While this variant has been seen in one family with VHL, there have been several studies that have shown it is not associated with VHL.  Since the current test is not perfect, it is possible there may be a gene mutation that current testing cannot detect, but that chance is small. We also discussed that it is possible that a different genetic factor, which was not part of this testing or has not yet been discovered, is responsible for the cancer diagnoses in the family. Again, the likelihood of this is low. No additional testing is recommended at this time.   Cancer Screening: This result suggests that Yolanda Huffman's cancer was most likely not due to an inherited predisposition. Most cancers happen by chance and this negative test, along with details of her family history, suggests that her cancer falls into this category. We, therefore, recommended she continue to follow the cancer screening guidelines provided by her physician.   Family Members: Family members are at some increased risk of developing cancer, over the general population risk, simply due to the family history. We recommended women have a yearly mammogram beginning at age 89, a yearly clinical breast exam, and perform monthly breast self-exams. A gynecologic exam is recommended yearly. Colon cancer screening is recommended to begin by age 19 for men and women.  Any relative who had cancer at a young age or had a particularly rare cancer may also wish to pursue genetic testing. Genetic counselors can be located in other cities, by visiting the website of the [employer] (ArtistMovie.se) and Field seismologist  for a Dietitian by zip code.   While these results are reassuring for Yolanda Huffman, this test does not tell us anything about Yolanda Huffman's nieces' and nephews' genetic  status. We recommended these relatives also have genetic counseling and testing. Please let us know if we can help facilitate testing. Genetic counselors can be located in other cities, by visiting the website of the [employer] (ArtistMovie.se) and Field seismologist for a Dietitian by zip code.  Individuals in Yolanda Huffman's family may wish to be tested for the VHL variant associated with familial erythrocytosis. Yolanda Huffman daughter has a 50% chance of also carrying this variant and her nieces and nephews have a 25% chance of having inherited the variant. If their partners are also carries of the same variant, they have a 25% chance of having a child with the condition. Genetic testing for this variant is available.    Lastly, cancer genetics is a rapidly advancing field and it is possible that new genetic tests will be appropriate for her in the future. We encourage her to remain in contact with [place] on an annual basis so we can update her personal and family histories, and let her know of advances in cancer genetics that may benefit the family. Our contact number was provided. Yolanda Huffman is welcome to call anytime with additional questions.    [name], MS, [facility] Certified Genetic Counselor phone: [phone] [email or web address]

## 2016-09-12 ENCOUNTER — Other Ambulatory Visit: Payer: Self-pay | Admitting: General Surgery

## 2016-09-12 ENCOUNTER — Encounter: Payer: Self-pay | Admitting: Oncology

## 2016-09-12 DIAGNOSIS — D0512 Intraductal carcinoma in situ of left breast: Secondary | ICD-10-CM

## 2016-09-14 ENCOUNTER — Encounter (HOSPITAL_COMMUNITY)
Admission: RE | Admit: 2016-09-14 | Discharge: 2016-09-14 | Disposition: A | Discharge status: Home / Self Care | Payer: Medicare Other | Source: Ambulatory Visit | Attending: General Surgery | Admitting: General Surgery

## 2016-09-14 ENCOUNTER — Encounter (HOSPITAL_COMMUNITY): Payer: Self-pay

## 2016-09-14 DIAGNOSIS — Z7901 Long term (current) use of anticoagulants: Secondary | ICD-10-CM | POA: Insufficient documentation

## 2016-09-14 DIAGNOSIS — Z8249 Family history of ischemic heart disease and other diseases of the circulatory system: Secondary | ICD-10-CM | POA: Diagnosis not present

## 2016-09-14 DIAGNOSIS — Z79899 Other long term (current) drug therapy: Secondary | ICD-10-CM | POA: Insufficient documentation

## 2016-09-14 DIAGNOSIS — Z87891 Personal history of nicotine dependence: Secondary | ICD-10-CM | POA: Diagnosis not present

## 2016-09-14 DIAGNOSIS — Z01812 Encounter for preprocedural laboratory examination: Secondary | ICD-10-CM | POA: Diagnosis not present

## 2016-09-14 DIAGNOSIS — E1122 Type 2 diabetes mellitus with diabetic chronic kidney disease: Secondary | ICD-10-CM | POA: Diagnosis not present

## 2016-09-14 DIAGNOSIS — N189 Chronic kidney disease, unspecified: Secondary | ICD-10-CM | POA: Insufficient documentation

## 2016-09-14 DIAGNOSIS — Z950 Presence of cardiac pacemaker: Secondary | ICD-10-CM | POA: Diagnosis not present

## 2016-09-14 DIAGNOSIS — Z794 Long term (current) use of insulin: Secondary | ICD-10-CM | POA: Insufficient documentation

## 2016-09-14 DIAGNOSIS — I129 Hypertensive chronic kidney disease with stage 1 through stage 4 chronic kidney disease, or unspecified chronic kidney disease: Secondary | ICD-10-CM | POA: Diagnosis not present

## 2016-09-14 DIAGNOSIS — D0512 Intraductal carcinoma in situ of left breast: Secondary | ICD-10-CM | POA: Diagnosis not present

## 2016-09-14 DIAGNOSIS — Z803 Family history of malignant neoplasm of breast: Secondary | ICD-10-CM | POA: Diagnosis not present

## 2016-09-14 HISTORY — DX: Malignant (primary) neoplasm, unspecified: C80.1

## 2016-09-14 HISTORY — DX: Unspecified osteoarthritis, unspecified site: M19.90

## 2016-09-14 LAB — BASIC METABOLIC PANEL
Anion gap: 12 (ref 5–15)
BUN: 66 mg/dL — AB (ref 6–20)
CHLORIDE: 99 mmol/L — AB (ref 101–111)
CO2: 23 mmol/L (ref 22–32)
Calcium: 9.4 mg/dL (ref 8.9–10.3)
Creatinine, Ser: 2.58 mg/dL — ABNORMAL HIGH (ref 0.44–1.00)
GFR calc Af Amer: 20 mL/min — ABNORMAL LOW (ref >60)
GFR calc non Af Amer: 17 mL/min — ABNORMAL LOW (ref >60)
Glucose, Bld: 183 mg/dL — ABNORMAL HIGH (ref 65–99)
POTASSIUM: 3.4 mmol/L — AB (ref 3.5–5.1)
SODIUM: 134 mmol/L — AB (ref 135–145)

## 2016-09-14 LAB — CBC
HEMATOCRIT: 37.2 % (ref 36.0–46.0)
Hemoglobin: 12.5 g/dL (ref 12.0–15.0)
MCH: 29.1 pg (ref 26.0–34.0)
MCHC: 33.6 g/dL (ref 30.0–36.0)
MCV: 86.7 fL (ref 78.0–100.0)
Platelets: 186 10*3/uL (ref 150–400)
RBC: 4.29 MIL/uL (ref 3.87–5.11)
RDW: 14.9 % (ref 11.5–15.5)
WBC: 7.7 10*3/uL (ref 4.0–10.5)

## 2016-09-14 LAB — GLUCOSE, CAPILLARY: GLUCOSE-CAPILLARY: 146 mg/dL — AB (ref 65–99)

## 2016-09-14 NOTE — Pre-Procedure Instructions (Addendum)
Yolanda Huffman  09/14/2016    Your procedure is scheduled on Monday, September 19, 2016 at 3:05 PM.   Report to [facility] Entrance "A" Admitting Office at 1:00 PM.   Call this number if you have problems the morning of surgery: [phone]   Questions prior to day of surgery, please call [phone] between 8 & 4 PM.   Remember:  Do not eat food or drink liquids after midnight Sunday, 09/18/16.  Take these medicines the morning of surgery with A SIP OF WATER: Metoprolol (Toprol XL), Pantoprazole (Protonix)  Stop Multivitamins, Fish Oil and Herbal medications/supplements as of today.   Drink 8 oz bottle of water 2 hours to time of arrival day of surgery (drink it at 11:00 AM)   How to Manage Your Diabetes Before Surgery   Why is it important to control my blood sugar before and after surgery?   Improving blood sugar levels before and after surgery helps healing and can limit problems.  A way of improving blood sugar control is eating a healthy diet by:  - Eating less sugar and carbohydrates  - Increasing activity/exercise  - Talk with your doctor about reaching your blood sugar goals  High blood sugars (greater than 180 mg/dL) can raise your risk of infections and slow down your recovery so you will need to focus on controlling your diabetes during the weeks before surgery.  Make sure that the doctor who takes care of your diabetes knows about your planned surgery including the date and location.  How do I manage my blood sugars before surgery?   Check your blood sugar at least 4 times a day, 2 days before surgery to make sure that they are not too high or low.  Check your blood sugar the morning of your surgery when you wake up and every 2 hours until you get to the Short-Stay unit.  Treat a low blood sugar (less than 70 mg/dL) with 1/2 cup of clear juice (cranberry or apple), 4 glucose tablets, OR glucose gel.  Recheck blood sugar in 15 minutes after treatment  (to make sure it is greater than 70 mg/dL).  If blood sugar is not greater than 70 mg/dL on re-check, call [phone] for further instructions.   Report your blood sugar to the Short-Stay nurse when you get to Short-Stay.  References:  [facility], 2007 "How to Manage your Diabetes Before and After Surgery".  What do I do about my diabetes medications?   THE NIGHT BEFORE SURGERY, take 6 units of Levemir Insulin.   Do not wear jewelry, make-up or nail polish.  Do not wear lotions, powders, perfumes or deodorant.  Do not shave 48 hours prior to surgery.    Do not bring valuables to the hospital.  [facility] is not responsible for any belongings or valuables.  Contacts, dentures or bridgework may not be worn into surgery.  Leave your suitcase in the car.  After surgery it may be brought to your room.  For patients admitted to the hospital, discharge time will be determined by your treatment team.  Patients discharged the day of surgery will not be allowed to drive home.   Special instructions:  [place] - Preparing for Surgery  Before surgery, you can play an important role.  Because skin is not sterile, your skin needs to be as free of germs as possible.  You can reduce the number of germs on you skin by washing with  CHG (chlorahexidine gluconate) soap before surgery.  CHG is an antiseptic cleaner which kills germs and bonds with the skin to continue killing germs even after washing.  Please DO NOT use if you have an allergy to CHG or antibacterial soaps.  If your skin becomes reddened/irritated stop using the CHG and inform your nurse when you arrive at Short Stay.  Do not shave (including legs and underarms) for at least 48 hours prior to the first CHG shower.  You may shave your face.  Please follow these instructions carefully:   1.  Shower with CHG Soap the night before surgery and the                    morning of Surgery.  2.  If you choose to  wash your hair, wash your hair first as usual with your       normal shampoo.  3.  After you shampoo, rinse your hair and body thoroughly to remove the shampoo.  4.  Use CHG as you would any other liquid soap.  You can apply chg directly       to the skin and wash gently with scrungie or a clean washcloth.  5.  Apply the CHG Soap to your body ONLY FROM THE NECK DOWN.        Do not use on open wounds or open sores.  Avoid contact with your eyes, ears, mouth and genitals (private parts).  Wash genitals (private parts) with your normal soap.  6.  Wash thoroughly, paying special attention to the area where your surgery        will be performed.  7.  Thoroughly rinse your body with warm water from the neck down.  8.  DO NOT shower/wash with your normal soap after using and rinsing off       the CHG Soap.  9.  Pat yourself dry with a clean towel.            10.  Wear clean pajamas.            11.  Place clean sheets on your bed the night of your first shower and do not        sleep with pets.  Day of Surgery  Do not apply any lotions/deodorants the morning of surgery.  Please wear clean clothes to the hospital.   Please read over the fact sheets that you were given.

## 2016-09-14 NOTE — Progress Notes (Signed)
Pt has hx of a stent in 2009 in [place]. Pt has hx of A-fib and has a pacemaker, is followed by Dr. Cristopher Peru. Pt denies any chest pain or sob. Pt has hx of chronic Kidney disease, ? Stage 3. Sees Dr. Meredeth Ide for her kidney disease. Pt is diabetic, type 2. No A1C for several months (followed by Dr. Elease Hashimoto). Pt states her fasting blood sugar is usually between 80-95.

## 2016-09-15 ENCOUNTER — Encounter: Payer: Self-pay | Admitting: Oncology

## 2016-09-15 ENCOUNTER — Telehealth: Payer: Self-pay | Admitting: Family Medicine

## 2016-09-15 LAB — HEMOGLOBIN A1C
Hgb A1c MFr Bld: 6.5 % — ABNORMAL HIGH (ref 4.8–5.6)
Mean Plasma Glucose: 140 mg/dL

## 2016-09-15 NOTE — Progress Notes (Signed)
Anesthesia Chart Review:  Pt is a 76 year old female scheduled for L breast lumpectomy with radioactive seed localization on 09/19/2016 with Jovita Kussmaul, MD.   - PCP is Carolann Littler, MD - Cardiologist is Cristopher Peru, MD, who cleared pt for surgery. Last office visit 11/06/15 - Nephrologist is Donato Heinz, MD (notes in media tab)  PMH includes:  CAD (s/p PCI 2009), atrial fibrillation, chronic combined systolic and diastolic CHF, sinus node dysfunction, pacemaker ([employer]tronic; inserted 2009), HTN, DM, hyperlipidemia, anemia, CKD (stage IV), PUD.  Former smoker. BMI 26.  Occasions include: Lipitor, Lasix, Levemir, metolazone, metoprolol, Protonix, potassium, Coumadin. Patient stopped Coumadin 09/14/16.  Preoperative labs reviewed.   - HbA1c 6.5, glucose 183 - Cr 2.58, BUN 66. This is pt's baseline Cr per nephrology notes.  - Will get PT DOS.   EKG 11/06/15: Electronic ventricular pacemaker  Echo 09/08/10: 1. LV cavity size normal. Wall thickness increased in a pattern of mild LVH. Systolic function mildly reduced. EF 45-50%. Wall motion normal. No regional wall motion abnormalities. LV diastolic function parameters were normal. 2. Mild aortic regurgitation. 3. Mild mitral regurgitation. 4. Moderate tricuspid regurgitation. 5. Pulmonary arteries: PA peak pressure 44 mmHg.  Perioperative prescription for pacemaker states patient is pacemaker dependent and procedure should not interfere with device function. No device reprogramming magnet placement needed.  If no changes, I anticipate pt can proceed with surgery as scheduled.   Willeen Cass, FNP-BC [facility]/Anesthesiology Phone: [phone] 09/15/2016 11:33 AM

## 2016-09-15 NOTE — Telephone Encounter (Signed)
FYI pt is having a lum[facility] on 09-19-16 at [facility]

## 2016-09-16 ENCOUNTER — Ambulatory Visit
Admission: RE | Admit: 2016-09-16 | Discharge: 2016-09-16 | Disposition: A | Discharge status: Home / Self Care | Payer: Medicare Other | Source: Ambulatory Visit | Attending: General Surgery | Admitting: General Surgery

## 2016-09-16 DIAGNOSIS — D0512 Intraductal carcinoma in situ of left breast: Secondary | ICD-10-CM

## 2016-09-16 MED ORDER — CEFAZOLIN SODIUM-DEXTROSE 2-4 GM/100ML-% IV SOLN
2.0000 g | INTRAVENOUS | Status: AC
  Administered 2016-09-19: 2 g via INTRAVENOUS
  Filled 2016-09-16: qty 100

## 2016-09-16 MED ORDER — GABAPENTIN 300 MG PO CAPS
300.0000 mg | ORAL_CAPSULE | ORAL | Status: AC
  Administered 2016-09-19: 300 mg via ORAL
  Filled 2016-09-16: qty 1

## 2016-09-16 MED ORDER — CELECOXIB 200 MG PO CAPS
400.0000 mg | ORAL_CAPSULE | ORAL | Status: AC
  Administered 2016-09-19: 400 mg via ORAL
  Filled 2016-09-16: qty 2

## 2016-09-19 ENCOUNTER — Ambulatory Visit (HOSPITAL_COMMUNITY): Payer: Medicare Other | Admitting: Anesthesiology

## 2016-09-19 ENCOUNTER — Ambulatory Visit (HOSPITAL_COMMUNITY): Payer: Medicare Other | Admitting: Emergency Medicine

## 2016-09-19 ENCOUNTER — Encounter (HOSPITAL_COMMUNITY)
Admission: RE | Disposition: A | Discharge status: Home / Self Care | Payer: Self-pay | Source: Ambulatory Visit | Attending: General Surgery

## 2016-09-19 ENCOUNTER — Ambulatory Visit: Payer: Medicare Other

## 2016-09-19 ENCOUNTER — Encounter: Payer: Self-pay | Admitting: Family Medicine

## 2016-09-19 ENCOUNTER — Ambulatory Visit (HOSPITAL_COMMUNITY)
Admission: RE | Admit: 2016-09-19 | Discharge: 2016-09-19 | Disposition: A | Discharge status: Home / Self Care | Payer: Medicare Other | Source: Ambulatory Visit | Attending: General Surgery | Admitting: General Surgery

## 2016-09-19 ENCOUNTER — Ambulatory Visit
Admission: RE | Admit: 2016-09-19 | Discharge: 2016-09-19 | Disposition: A | Discharge status: Home / Self Care | Payer: Medicare Other | Source: Ambulatory Visit | Attending: General Surgery | Admitting: General Surgery

## 2016-09-19 ENCOUNTER — Encounter (HOSPITAL_COMMUNITY): Payer: Self-pay | Admitting: Anesthesiology

## 2016-09-19 DIAGNOSIS — Z950 Presence of cardiac pacemaker: Secondary | ICD-10-CM | POA: Diagnosis not present

## 2016-09-19 DIAGNOSIS — Z7901 Long term (current) use of anticoagulants: Secondary | ICD-10-CM | POA: Diagnosis not present

## 2016-09-19 DIAGNOSIS — E119 Type 2 diabetes mellitus without complications: Secondary | ICD-10-CM | POA: Insufficient documentation

## 2016-09-19 DIAGNOSIS — I2510 Atherosclerotic heart disease of native coronary artery without angina pectoris: Secondary | ICD-10-CM | POA: Diagnosis not present

## 2016-09-19 DIAGNOSIS — Z87891 Personal history of nicotine dependence: Secondary | ICD-10-CM | POA: Diagnosis not present

## 2016-09-19 DIAGNOSIS — D0512 Intraductal carcinoma in situ of left breast: Secondary | ICD-10-CM | POA: Insufficient documentation

## 2016-09-19 DIAGNOSIS — I10 Essential (primary) hypertension: Secondary | ICD-10-CM | POA: Insufficient documentation

## 2016-09-19 DIAGNOSIS — Z794 Long term (current) use of insulin: Secondary | ICD-10-CM | POA: Diagnosis not present

## 2016-09-19 DIAGNOSIS — I252 Old myocardial infarction: Secondary | ICD-10-CM | POA: Diagnosis not present

## 2016-09-19 DIAGNOSIS — Z79899 Other long term (current) drug therapy: Secondary | ICD-10-CM | POA: Diagnosis not present

## 2016-09-19 DIAGNOSIS — D242 Benign neoplasm of left breast: Secondary | ICD-10-CM | POA: Diagnosis not present

## 2016-09-19 DIAGNOSIS — I4891 Unspecified atrial fibrillation: Secondary | ICD-10-CM | POA: Insufficient documentation

## 2016-09-19 DIAGNOSIS — Z803 Family history of malignant neoplasm of breast: Secondary | ICD-10-CM | POA: Diagnosis not present

## 2016-09-19 DIAGNOSIS — E7800 Pure hypercholesterolemia, unspecified: Secondary | ICD-10-CM | POA: Diagnosis not present

## 2016-09-19 HISTORY — PX: BREAST LUMPECTOMY: SHX2

## 2016-09-19 HISTORY — PX: BREAST LUMPECTOMY WITH RADIOACTIVE SEED LOCALIZATION: SHX6424

## 2016-09-19 LAB — GLUCOSE, CAPILLARY
Glucose-Capillary: 74 mg/dL (ref 65–99)
Glucose-Capillary: 87 mg/dL (ref 65–99)
Glucose-Capillary: 85 mg/dL (ref 65–99)

## 2016-09-19 LAB — PROTIME-INR
INR: 1.25
PROTHROMBIN TIME: 15.8 s — AB (ref 11.4–15.2)

## 2016-09-19 SURGERY — BREAST LUMPECTOMY WITH RADIOACTIVE SEED LOCALIZATION
Anesthesia: General | Site: Breast | Laterality: Left

## 2016-09-19 MED ORDER — PHENYLEPHRINE 40 MCG/ML (10ML) SYRINGE FOR IV PUSH (FOR BLOOD PRESSURE SUPPORT)
PREFILLED_SYRINGE | INTRAVENOUS | Status: AC
  Filled 2016-09-19: qty 10

## 2016-09-19 MED ORDER — DEXAMETHASONE SODIUM PHOSPHATE 10 MG/ML IJ SOLN
INTRAMUSCULAR | Status: AC
  Filled 2016-09-19: qty 1

## 2016-09-19 MED ORDER — DEXAMETHASONE SODIUM PHOSPHATE 10 MG/ML IJ SOLN
INTRAMUSCULAR | Status: DC | PRN
  Administered 2016-09-19: 10 mg via INTRAVENOUS

## 2016-09-19 MED ORDER — OXYCODONE HCL 5 MG PO TABS: 5.0000 mg | ORAL_TABLET | Freq: Once | ORAL | Status: DC | PRN

## 2016-09-19 MED ORDER — CHLORHEXIDINE GLUCONATE CLOTH 2 % EX PADS: 6.0000 | MEDICATED_PAD | Freq: Once | CUTANEOUS | Status: DC

## 2016-09-19 MED ORDER — FENTANYL CITRATE (PF) 100 MCG/2ML IJ SOLN: 25.0000 ug | INTRAMUSCULAR | Status: DC | PRN

## 2016-09-19 MED ORDER — SODIUM CHLORIDE 0.9 % IV SOLN
INTRAVENOUS | Status: DC
  Administered 2016-09-19: 14:00:00 via INTRAVENOUS
  Administered 2016-09-19: 10 mL/h via INTRAVENOUS

## 2016-09-19 MED ORDER — ACETAMINOPHEN 500 MG PO TABS
1000.0000 mg | ORAL_TABLET | ORAL | Status: AC
  Administered 2016-09-19: 1000 mg via ORAL
  Filled 2016-09-19: qty 2

## 2016-09-19 MED ORDER — ONDANSETRON HCL 4 MG/2ML IJ SOLN
INTRAMUSCULAR | Status: AC
  Filled 2016-09-19: qty 2

## 2016-09-19 MED ORDER — BUPIVACAINE HCL (PF) 0.25 % IJ SOLN
INTRAMUSCULAR | Status: DC | PRN
  Administered 2016-09-19: 20 mL

## 2016-09-19 MED ORDER — HYDROCODONE-ACETAMINOPHEN 5-325 MG PO TABS: 1.0000 | ORAL_TABLET | ORAL | 0 refills | Status: DC | PRN

## 2016-09-19 MED ORDER — PHENYLEPHRINE HCL 10 MG/ML IJ SOLN
INTRAMUSCULAR | Status: DC | PRN
  Administered 2016-09-19: 120 ug via INTRAVENOUS
  Administered 2016-09-19: 80 ug via INTRAVENOUS
  Administered 2016-09-19 (×3): 120 ug via INTRAVENOUS
  Administered 2016-09-19: 80 ug via INTRAVENOUS

## 2016-09-19 MED ORDER — 0.9 % SODIUM CHLORIDE (POUR BTL) OPTIME
TOPICAL | Status: DC | PRN
  Administered 2016-09-19: 1000 mL

## 2016-09-19 MED ORDER — FENTANYL CITRATE (PF) 100 MCG/2ML IJ SOLN
INTRAMUSCULAR | Status: DC | PRN
  Administered 2016-09-19 (×2): 25 ug via INTRAVENOUS
  Administered 2016-09-19: 50 ug via INTRAVENOUS

## 2016-09-19 MED ORDER — PROPOFOL 10 MG/ML IV BOLUS
INTRAVENOUS | Status: DC | PRN
  Administered 2016-09-19: 50 mg via INTRAVENOUS
  Administered 2016-09-19: 130 mg via INTRAVENOUS
  Administered 2016-09-19: 20 mg via INTRAVENOUS

## 2016-09-19 MED ORDER — FENTANYL CITRATE (PF) 100 MCG/2ML IJ SOLN
INTRAMUSCULAR | Status: AC
  Filled 2016-09-19: qty 2

## 2016-09-19 MED ORDER — BUPIVACAINE HCL (PF) 0.25 % IJ SOLN
INTRAMUSCULAR | Status: AC
  Filled 2016-09-19: qty 30

## 2016-09-19 MED ORDER — PROPOFOL 10 MG/ML IV BOLUS
INTRAVENOUS | Status: AC
  Filled 2016-09-19: qty 20

## 2016-09-19 MED ORDER — LIDOCAINE HCL (CARDIAC) 20 MG/ML IV SOLN
INTRAVENOUS | Status: DC | PRN
  Administered 2016-09-19: 60 mg via INTRAVENOUS

## 2016-09-19 MED ORDER — OXYCODONE HCL 5 MG/5ML PO SOLN: 5.0000 mg | Freq: Once | ORAL | Status: DC | PRN

## 2016-09-19 MED ORDER — ONDANSETRON HCL 4 MG/2ML IJ SOLN
INTRAMUSCULAR | Status: DC | PRN
  Administered 2016-09-19: 4 mg via INTRAVENOUS

## 2016-09-19 SURGICAL SUPPLY — 38 items
APPLIER CLIP 9.375 MED OPEN (MISCELLANEOUS)
BINDER BREAST XLRG (GAUZE/BANDAGES/DRESSINGS) ×3 IMPLANT
BLADE SURG 15 STRL LF DISP TIS (BLADE) ×1 IMPLANT
BLADE SURG 15 STRL SS (BLADE) ×2
CANISTER SUCT 3000ML PPV (MISCELLANEOUS) ×3 IMPLANT
CHLORAPREP W/TINT 26ML (MISCELLANEOUS) ×3 IMPLANT
CLIP APPLIE 9.375 MED OPEN (MISCELLANEOUS) IMPLANT
COVER PROBE W GEL 5X96 (DRAPES) ×3 IMPLANT
COVER SURGICAL LIGHT HANDLE (MISCELLANEOUS) ×3 IMPLANT
DERMABOND ADVANCED (GAUZE/BANDAGES/DRESSINGS) ×2
DERMABOND ADVANCED .7 DNX12 (GAUZE/BANDAGES/DRESSINGS) ×1 IMPLANT
DEVICE DUBIN SPECIMEN MAMMOGRA (MISCELLANEOUS) ×3 IMPLANT
DRAPE CHEST BREAST 15X10 FENES (DRAPES) ×3 IMPLANT
DRAPE UTILITY XL STRL (DRAPES) ×3 IMPLANT
ELECT COATED BLADE 2.86 ST (ELECTRODE) ×3 IMPLANT
ELECT REM PT RETURN 9FT ADLT (ELECTROSURGICAL) ×3
ELECTRODE REM PT RTRN 9FT ADLT (ELECTROSURGICAL) ×1 IMPLANT
GLOVE BIO SURGEON STRL SZ7.5 (GLOVE) ×6 IMPLANT
GOWN STRL REUS W/ TWL LRG LVL3 (GOWN DISPOSABLE) ×2 IMPLANT
GOWN STRL REUS W/TWL LRG LVL3 (GOWN DISPOSABLE) ×4
KIT BASIN OR (CUSTOM PROCEDURE TRAY) ×3 IMPLANT
KIT MARKER MARGIN INK (KITS) ×3 IMPLANT
LIGHT WAVEGUIDE WIDE FLAT (MISCELLANEOUS) IMPLANT
NEEDLE HYPO 25X1 1.5 SAFETY (NEEDLE) ×3 IMPLANT
NS IRRIG 1000ML POUR BTL (IV SOLUTION) ×3 IMPLANT
PACK SURGICAL SETUP 50X90 (CUSTOM PROCEDURE TRAY) ×3 IMPLANT
PENCIL BUTTON HOLSTER BLD 10FT (ELECTRODE) ×3 IMPLANT
SPONGE LAP 18X18 X RAY DECT (DISPOSABLE) ×3 IMPLANT
SUT MNCRL AB 4-0 PS2 18 (SUTURE) ×3 IMPLANT
SUT SILK 2 0 SH (SUTURE) IMPLANT
SUT VIC AB 3-0 SH 18 (SUTURE) ×3 IMPLANT
SYR BULB 3OZ (MISCELLANEOUS) ×3 IMPLANT
SYR CONTROL 10ML LL (SYRINGE) ×3 IMPLANT
TOWEL OR 17X24 6PK STRL BLUE (TOWEL DISPOSABLE) ×3 IMPLANT
TOWEL OR 17X26 10 PK STRL BLUE (TOWEL DISPOSABLE) ×3 IMPLANT
TUBE CONNECTING 12'X1/4 (SUCTIONS) ×1
TUBE CONNECTING 12X1/4 (SUCTIONS) ×2 IMPLANT
YANKAUER SUCT BULB TIP NO VENT (SUCTIONS) ×3 IMPLANT

## 2016-09-19 NOTE — H&P (Signed)
Yolanda Huffman  Location: [facility] Patient #: [id number] DOB: 24-Mar-1941 Widowed / Language: Yolanda Huffman / Race: White Female   History of Present Illness  The patient is a 76 year old female who presents with breast cancer. We are asked to see the patient in consultation by Dr. Carolann Littler to evaluate her for a new left breast cancer. The patient is a 76 year old white female who recently went for a routine screening mammogram. At that time she was found to have a 7 mm area of abnormal calcification in the upper inner quadrant of the left breast. 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 She does have a significant cardiac history and has a pacemaker on her left chest wall. She also has significant family history of breast cancer in a maternal aunt and a sister and a niece.   Past Surgical History  Breast Biopsy  Left. Colon Polyp Removal - Colonoscopy  Knee Surgery  Right. Oral Surgery   Diagnostic Studies History  Colonoscopy  within last year Mammogram  within last year Pap Smear  1-5 years ago  Allergies  No Known Drug Allergies  Medication History  Atorvastatin Calcium (40MG  Tablet, Oral) Active. Calcitriol (0.25MCG Capsule, Oral) Active. Furosemide (40MG  Tablet, Oral) Active. Levemir FlexTouch (100UNIT/ML Soln Pen-inj, Subcutaneous) Active. MetOLazone (2.5MG  Tablet, Oral MWF) Active. Metoprolol Succinate ER (25MG  Tablet ER 24HR, Oral) Active. Warfarin Sodium (5MG  Tablet, Oral) Active. Potassium Chloride ER (20MEQ Tablet ER, Oral) Active. Pantoprazole Sodium (40MG  Tablet DR, Oral) Active. Multivitamin Adults (Oral) Active. Colchicine (0.6MG  Capsule, Oral as needed) Active. Cranberry (1000MG  Capsule, Oral) Active. Fish Oil Omega-3 (1000MG  Capsule, Oral) Active. Medications Reconciled  Social History  Alcohol use  Remotely quit alcohol use. No caffeine use  No drug use  Tobacco  use  Former smoker.  Family History Alcohol Abuse  Father. Arthritis  Mother. Breast Cancer  Family Members In General. Heart Disease  Father, Mother.  Pregnancy / Birth History Age at menarche  89 years. Age of menopause  40-50 Gravida  1 Maternal age  53-30 Para  1  Other Problems  Breast Cancer  Diabetes Mellitus  Hemorrhoids  High blood pressure  Hypercholesterolemia  Myocardial infarction     Review of Systems  General Not Present- Appetite Loss, Chills, Fatigue, Fever, Night Sweats, Weight Gain and Weight Loss. Skin Not Present- Change in Wart/Mole, Dryness, Hives, Jaundice, New Lesions, Non-Healing Wounds, Rash and Ulcer. HEENT Present- Wears glasses/contact lenses. Not Present- Earache, Hearing Loss, Hoarseness, Nose Bleed, Oral Ulcers, Ringing in the Ears, Seasonal Allergies, Sinus Pain, Sore Throat, Visual Disturbances and Yellow Eyes. Respiratory Present- Snoring. Not Present- Bloody sputum, Chronic Cough, Difficulty Breathing and Wheezing. Breast Not Present- Breast Mass, Breast Pain, Nipple Discharge and Skin Changes. Cardiovascular Not Present- Chest Pain, Difficulty Breathing Lying Down, Leg Cramps, Palpitations, Rapid Heart Rate, Shortness of Breath and Swelling of Extremities. Gastrointestinal Not Present- Abdominal Pain, Bloating, Bloody Stool, Change in Bowel Habits, Chronic diarrhea, Constipation, Difficulty Swallowing, Excessive gas, Gets full quickly at meals, Hemorrhoids, Indigestion, Nausea, Rectal Pain and Vomiting. Female Genitourinary Not Present- Frequency, Nocturia, Painful Urination, Pelvic Pain and Urgency. Musculoskeletal Not Present- Back Pain, Joint Pain, Joint Stiffness, Muscle Pain, Muscle Weakness and Swelling of Extremities. Neurological Not Present- Decreased Memory, Fainting, Headaches, Numbness, Seizures, Tingling, Tremor, Trouble walking and Weakness. Psychiatric Not Present- Anxiety, Bipolar, Change in Sleep Pattern,  Depression, Fearful and Frequent crying. Hematology Present- Blood Thinners. Not Present- Easy Bruising,  Excessive bleeding, Gland problems, HIV and Persistent Infections.  Vitals  Weight: 155 lb Height: 63.5in Height was reported by patient. Body Surface Area: 1.75 m Body Mass Index: 27.03 kg/m  Temp.: 98.39F  Pulse: 75 (Regular)  BP: 130/78 (Sitting, Left Arm, Standard)       Physical Exam  General Mental Status-Alert. General Appearance-Consistent with stated age. Hydration-Well hydrated. Voice-Normal.  Head and Neck Head-normocephalic, atraumatic with no lesions or palpable masses. Trachea-midline. Thyroid Gland Characteristics - normal size and consistency.  Eye Eyeball - Bilateral-Extraocular movements intact. Sclera/Conjunctiva - Bilateral-No scleral icterus.  Chest and Lung Exam Chest and lung exam reveals -quiet, even and easy respiratory effort with no use of accessory muscles and on auscultation, normal breath sounds, no adventitious sounds and normal vocal resonance. Inspection Chest Wall - Normal. Back - normal.  Breast Note: There is no palpable mass in either breast. There is no palpable axillary, supraclavicular, or cervical lymphadenopathy. She does have a pacemaker implanted on her left chest wall   Cardiovascular Cardiovascular examination reveals -normal heart sounds, regular rate and rhythm with no murmurs and normal pedal pulses bilaterally.  Abdomen Inspection Inspection of the abdomen reveals - No Hernias. Skin - Scar - no surgical scars. Palpation/Percussion Palpation and Percussion of the abdomen reveal - Soft, Non Tender, No Rebound tenderness, No Rigidity (guarding) and No hepatosplenomegaly. Auscultation Auscultation of the abdomen reveals - Bowel sounds normal.  Neurologic Neurologic evaluation reveals -alert and oriented x 3 with no impairment of recent or remote memory. Mental  Status-Normal.  Musculoskeletal Normal Exam - Left-Upper Extremity Strength Normal and Lower Extremity Strength Normal. Normal Exam - Right-Upper Extremity Strength Normal and Lower Extremity Strength Normal.  Lymphatic Head & Neck  General Head & Neck Lymphatics: Bilateral - Description - Normal. Axillary  General Axillary Region: Bilateral - Description - Normal. Tenderness - Non Tender. Femoral & Inguinal  Generalized Femoral & Inguinal Lymphatics: Bilateral - Description - Normal. Tenderness - Non Tender.    Assessment & Plan  DUCTAL CARCINOMA IN SITU (DCIS) OF LEFT BREAST (D05.12) Impression: The patient appears to have a small area of ductal carcinoma in situ in the upper inner left breast. I have talked to her in detail about the different options for treatment and at this point she favors breast conservation. I think this is a very reasonable way of treating her breast cancer. Given her underlying cardiac disease and her pacemaker I will get cardiac clearance from Dr. Lovena Le prior to scheduling surgery. I will go ahead and refer her to medical oncology and genetics. If she is a candidate for surgery then she will need to be off her blood thinners and we will plan for a left breast radioactive seed localized lumpectomy. I have discussed with her in detail the risks and benefits of the operation as well as some of the technical aspects and she understands and wishes to proceed. Current Plans Referred to Oncology, for evaluation and follow up (Oncology). Routine. Referred to Genetic Counseling, for evaluation and follow up [employer]). Routine. Pt Education - Breast cancer: discussed with patient and provided information.

## 2016-09-19 NOTE — Progress Notes (Signed)
Dr Ermalene Postin called and informed of pt CBG of 74 no new orders at this time.

## 2016-09-19 NOTE — Anesthesia Preprocedure Evaluation (Signed)
Anesthesia Evaluation  Patient identified by MRN, date of birth, ID band Patient awake    Reviewed: Allergy & Precautions, NPO status , Patient's Chart, lab work & pertinent test results  History of Anesthesia Complications Negative for: history of anesthetic complications  Airway Mallampati: II  TM Distance: >3 FB Neck ROM: Full    Dental  (+) Edentulous Upper, Edentulous Lower   Pulmonary neg shortness of breath, neg sleep apnea, neg COPD, neg recent URI, former smoker,    breath sounds clear to auscultation       Cardiovascular hypertension, Pt. on medications and Pt. on home beta blockers + CAD and + Past MI  + dysrhythmias Atrial Fibrillation + pacemaker  Rhythm:Regular     Neuro/Psych negative neurological ROS  negative psych ROS   GI/Hepatic Neg liver ROS, PUD,   Endo/Other  diabetes, Insulin Dependent  Renal/GU Renal InsufficiencyRenal disease     Musculoskeletal  (+) Arthritis ,   Abdominal   Peds  Hematology   Anesthesia Other Findings   Reproductive/Obstetrics                             Anesthesia Physical Anesthesia Plan  ASA: III  Anesthesia Plan: General   Post-op Pain Management:    Induction: Intravenous  Airway Management Planned: LMA  Additional Equipment: None  Intra-op Plan:   Post-operative Plan: Extubation in OR  Informed Consent: I have reviewed the patients History and Physical, chart, labs and discussed the procedure including the risks, benefits and alternatives for the proposed anesthesia with the patient or authorized representative who has indicated his/her understanding and acceptance.   Dental advisory given  Plan Discussed with: CRNA and Surgeon  Anesthesia Plan Comments: (Sterile magnet for possible pacemaker interference )        Anesthesia Quick Evaluation

## 2016-09-19 NOTE — Op Note (Signed)
09/19/2016  4:04 PM  PATIENT:  Yolanda Huffman  76 y.o. female  PRE-OPERATIVE DIAGNOSIS:  LEFT BREAST DCIS  POST-OPERATIVE DIAGNOSIS:  LEFT BREAST DCIS  PROCEDURE:  Procedure(s): LEFT BREAST LUMPECTOMY WITH RADIOACTIVE SEED LOCALIZATION (Left)  SURGEON:  Surgeon(s) and Role:    * Jovita Kussmaul, MD - Primary  PHYSICIAN ASSISTANT:   ASSISTANTS: none   ANESTHESIA:   local and general  EBL:  No intake/output data recorded.  BLOOD ADMINISTERED:none  DRAINS: none   LOCAL MEDICATIONS USED:  MARCAINE     SPECIMEN:  Source of Specimen:  left breast tissue with additional superior, inferior, and medial margins  DISPOSITION OF SPECIMEN:  PATHOLOGY  COUNTS:  YES  TOURNIQUET:  * No tourniquets in log *  DICTATION: .Dragon Dictation   After informed consent was obtained the patient was brought to the operating room and placed in the supine position on the operating room table. After adequate induction of general anesthesia the patient's left breast was prepped with ChloraPrep, allowed to dry, and draped in usual sterile manner. An appropriate timeout was performed. Previously an I-125 seed was placed in the upper inner quadrant of the left breast to mark an area of ductal carcinoma in situ. The neoprobe was set to I-125 in the area of radioactivity was readily identified in the upper inner left breast. This area appeared to be several centimeters away from the inferior aspect of her pacemaker. Next a curvilinear incision was made overlying the area of radioactivity with a 15 blade knife. The incision was carried through the skin and subcutaneous tissue sharply with the electrocautery. Dissection was then carried into the breast under the direction of the neoprobe. Once I approached the radioactive seed then removed a circular portion of breast tissue sharply around the radioactive seed by checking the area of radioactivity frequently with the neoprobe. Once the specimen was removed it was  oriented with the appropriate paint colors. A specimen radiograph showed the clip and seed to be within the specimen. I decided to take an additional superior, inferior, and medial margin sharply with electrocautery based on the specimen radiograph and the appearance of the tissue. These margins were marked appropriately. Hemostasis was achieved using the Bovie electrocautery. The wound was then infiltrated with quarter percent Marcaine and irrigated with saline. The deep layer of the wound was then closed with layers of interrupted 3-0 Vicryl stitches. The skin was then closed with a running 4-0 Monocryl subcuticular stitch. Dermabond dressings were applied. The patient tolerated the procedure well. At the end of the case all needle sponge and instrument counts were correct. The patient was then awakened and taken to recovery in stable condition.  PLAN OF CARE: Discharge to home after PACU  PATIENT DISPOSITION:  PACU - hemodynamically stable.   Delay start of Pharmacological VTE agent (>24hrs) due to surgical blood loss or risk of bleeding: not applicable

## 2016-09-19 NOTE — Transfer of Care (Signed)
Immediate Anesthesia Transfer of Care Note  Patient: Yolanda Huffman  Procedure(s) Performed: Procedure(s): LEFT BREAST LUMPECTOMY WITH RADIOACTIVE SEED LOCALIZATION (Left)  Patient Location: PACU  Anesthesia Type:General  Level of Consciousness: awake, alert , oriented and patient cooperative  Airway & Oxygen Therapy: Patient Spontanous Breathing and Patient connected to nasal cannula oxygen  Post-op Assessment: Report given to RN and Post -op Vital signs reviewed and stable  Post vital signs: Reviewed and stable  Last Vitals:  Vitals:   09/19/16 1318  BP: (!) 154/70  Pulse: 81  Resp: 18  Temp: 36.4 C    Last Pain:  Vitals:   09/19/16 1318  TempSrc: Oral         Complications: No apparent anesthesia complications

## 2016-09-19 NOTE — Interval H&P Note (Signed)
History and Physical Interval Note:  09/19/2016 2:15 PM  Yolanda Huffman  has presented today for surgery, with the diagnosis of LEFT BREAST DCIS  The various methods of treatment have been discussed with the patient and family. After consideration of risks, benefits and other options for treatment, the patient has consented to  Procedure(s): BREAST LUMPECTOMY WITH RADIOACTIVE SEED LOCALIZATION (Left) as a surgical intervention .  The patient's history has been reviewed, patient examined, no change in status, stable for surgery.  I have reviewed the patient's chart and labs.  Questions were answered to the patient's satisfaction.     TOTH III,PAUL S

## 2016-09-20 ENCOUNTER — Encounter (HOSPITAL_COMMUNITY): Payer: Self-pay | Admitting: General Surgery

## 2016-09-21 ENCOUNTER — Other Ambulatory Visit: Payer: Self-pay | Admitting: Family Medicine

## 2016-09-22 ENCOUNTER — Other Ambulatory Visit: Payer: Self-pay | Admitting: General Surgery

## 2016-09-22 DIAGNOSIS — R2232 Localized swelling, mass and lump, left upper limb: Secondary | ICD-10-CM

## 2016-09-23 NOTE — Anesthesia Postprocedure Evaluation (Addendum)
Anesthesia Post Note  Patient: Yolanda Huffman  Procedure(s) Performed: Procedure(s) (LRB): LEFT BREAST LUMPECTOMY WITH RADIOACTIVE SEED LOCALIZATION (Left)  Patient location during evaluation: PACU Anesthesia Type: General Level of consciousness: awake and alert Pain management: pain level controlled Vital Signs Assessment: post-procedure vital signs reviewed and stable Respiratory status: spontaneous breathing, nonlabored ventilation, respiratory function stable and patient connected to nasal cannula oxygen Cardiovascular status: blood pressure returned to baseline and stable Postop Assessment: no signs of nausea or vomiting Anesthetic complications: no       Last Vitals:  Vitals:   09/19/16 1700 09/19/16 1715  BP: 132/65 138/61  Pulse: 70 70  Resp: 15 14  Temp:  36.1 C    Last Pain:  Vitals:   09/19/16 1715  TempSrc:   PainSc: 2                  Shani Fitch

## 2016-09-27 ENCOUNTER — Telehealth: Payer: Self-pay | Admitting: Oncology

## 2016-09-27 ENCOUNTER — Ambulatory Visit (HOSPITAL_BASED_OUTPATIENT_CLINIC_OR_DEPARTMENT_OTHER): Payer: Medicare Other | Admitting: Oncology

## 2016-09-27 VITALS — BP 141/63 | HR 90 | Temp 97.5°F | Resp 18 | Wt 151.6 lb

## 2016-09-27 DIAGNOSIS — D0512 Intraductal carcinoma in situ of left breast: Secondary | ICD-10-CM

## 2016-09-27 DIAGNOSIS — E119 Type 2 diabetes mellitus without complications: Secondary | ICD-10-CM | POA: Diagnosis not present

## 2016-09-27 DIAGNOSIS — M810 Age-related osteoporosis without current pathological fracture: Secondary | ICD-10-CM | POA: Diagnosis not present

## 2016-09-27 MED ORDER — ANASTROZOLE 1 MG PO TABS: 1.0000 mg | ORAL_TABLET | Freq: Every day | ORAL | 4 refills | Status: DC

## 2016-09-27 NOTE — Telephone Encounter (Signed)
Gave patient avs report and appointments for May and June.  °

## 2016-09-27 NOTE — Progress Notes (Signed)
[facility]  [phone] [phone]     ID: MANIAH NADING DOB: 11-03-1974  MR#: [id number]  DVV#:[id number]  Patient Care Team: Eulas Post, MD as PCP - General (Family Medicine) Donato Heinz, MD as Consulting Physician (Nephrology) Chauncey Cruel, MD as Consulting Physician (Oncology) Autumn Messing III, MD as Consulting Physician (General Surgery) Chauncey Cruel, MD OTHER MD:  CHIEF COMPLAINT: Ductal carcinoma in situ   CURRENT TREATMENT: Anastrozole, and nausea/Prolia   BREAST CANCER HISTORY: From the original intake note:  The patient had routine bilateral screening mammography with tomography at the [facility] 07/06/2016, showing new calcifications in the left breast. The right breast was unremarkable. Unilateral left diagnostic mammography 07/27/2016 found the breast density to be category B. In the upper inner quadrant of the left breast there was a 0.7 cm group of pleomorphic calcifications.  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  Her subsequent history is as detailed below  INTERVAL HISTORY: Brinley returns today for follow-up of her ductal carcinoma in situ accompanied by her daughter. Since her last visit here she underwent left lumpectomy, without sentinel lymph node sampling. This showed (SZA [id number] was (intermediate grade ductal carcinoma in situ measuring 0.6 cm. Margins were negative.  REVIEW OF SYSTEMS: She did well with the surgery, with some bruising, but no significant pain, overt bleeding, fever amount or other complications. She continues to problems with her dentures, having a full upper and lower plates, no teeth overall left. She continues Rales with gout. Her diabetes is well-controlled. A detailed review of systems today was otherwise stable  PAST  MEDICAL HISTORY: Past Medical History:  Diagnosis Date  . Anemia   . Anemia   . Arthritis    gout  . Atrial fibrillation ([place])    prior Sotalol - d/c'd 2/2 increased creatinine; rate control strategy  . CAD (coronary artery disease)    s/p PCI in [place] in 1/09  . Cancer [facility])    left breast  . Chronic combined systolic and diastolic heart failure (HCC)    Echocardiogram 3/12: Mild LVH, EF 62-69%, normal diastolic function, mild AI, mild MR, PASP 44, normal wall motion  . CKD (chronic kidney disease)    sees Dr. Burman Foster - Stage 3 ?  Marland Kitchen Diabetes mellitus   . Hyperlipemia   . Hypertension   . MI, old    2009  . Osteoporosis   . Pacemaker   . Pleural effusion   . Pneumonia   . PUD (peptic ulcer disease) 10/2010   duodenal ulcer    PAST SURGICAL HISTORY: Past Surgical History:  Procedure Laterality Date  . BREAST LUMPECTOMY WITH RADIOACTIVE SEED LOCALIZATION Left 09/19/2016   Procedure: LEFT BREAST LUMPECTOMY WITH RADIOACTIVE SEED LOCALIZATION;  Surgeon: Autumn Messing III, MD;  Location: [place];  Service: General;  Laterality: Left;  . CATARACT EXTRACTION Bilateral    01/12/09 and 01/26/09 both eyes  . COLONOSCOPY    . CORONARY ANGIOPLASTY  2009  . PACEMAKER INSERTION  07/13/07  . stent implant  07/13/2007    FAMILY HISTORY Family History  Problem Relation Age of Onset  . Heart disease Father   . Heart attack Father   . Clotting disorder Father     blood clot  . Hypertension Father   . Heart disease Mother   . Breast cancer Maternal Aunt 50  .  Breast cancer Sister 57  . Breast cancer Other 40  . Colon cancer Neg Hx   The patient's father died at age 39 from a myocardial infarction. The patient's mother died from multiple problems including pleurisy at the age of 43. The patient has 3 brothers, 2 sisters. One sister was diagnosed with breast cancer in her late 35s. The other sister had a "vaginal cancer" (? Cervical cancer). A maternal aunt was diagnosed with breast cancer in  her 75s and her daughter, the patient's niece was diagnosed with breast cancer in her 69s.  GYNECOLOGIC HISTORY:  No LMP recorded. Patient is postmenopausal. Menarche age 67, first live birth age 89, the patient is GX P1. She stopped having periods in 1992. She did not take hormone replacement. She took oral contraceptives briefly remotely with no complications.  SOCIAL HISTORY:  She is originally from [place]. 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 She is a widow. She lives with her [place] in a retirement community. Her daughter Kristie Cowman lives in [place]. She is a Runner, broadcasting/film/video. The patient has no grandchildren. She is not a church attender    ADVANCED DIRECTIVES: The patient's daughter Alleen Borne is her healthcare power of attorney. Kenney Houseman can be reached at [phone] or [phone]   HEALTH MAINTENANCE: Social History  Substance Use Topics  . Smoking status: Former Smoker    Packs/day: 2.00    Years: 20.00    Types: Cigarettes    Quit date: 11/26/1991  . Smokeless tobacco: Never Used  . Alcohol use No     Colonoscopy:January 2017/ lobe our  PAP:  Bone density: Remote   Allergies  Allergen Reactions  . Amlodipine Besylate Swelling    edema    Current Outpatient Prescriptions  Medication Sig Dispense Refill  . anastrozole (ARIMIDEX) 1 MG tablet Take 1 tablet (1 mg total) by mouth daily. 90 tablet 4  . atorvastatin (LIPITOR) 40 MG tablet TAKE 1 TABLET (40 MG TOTAL) BY MOUTH DAILY. (Patient taking differently: Take 40 mg by mouth at bedtime. ) 90 tablet 2  . calcitRIOL (ROCALTROL) 0.25 MCG capsule Take 0.25 mcg by mouth every Monday, Wednesday, and Friday. In the morning.    . colchicine 0.6 MG tablet TAKE 1 TABLET (0.6 MG TOTAL) BY MOUTH AS NEEDED AS DIRECTED. (Patient taking differently: TAKE 1 TABLET (0.6 MG TOTAL) BY MOUTH DAILY AS NEEDED FOR GOUT FLARE UPS.) 30 tablet 4  . Cranberry 500 MG CAPS Take 500 mg by mouth daily with lunch.    . furosemide (LASIX)  40 MG tablet TAKE TWO TABLETS BY MOUTH EVERY MORNING AND TAKE ONE TABLET BY MOUTH EVERY EVENING (Patient taking differently: TAKE TWO TABLETS (80 MG) BY MOUTH EVERY MORNING AND TAKE ONE TABLET (40 MG) BY MOUTH EVERY EVENING) 270 tablet 1  . glucose blood test strip 1 each by Other route 3 (three) times daily. Use as instructed     . HYDROcodone-acetaminophen (NORCO/VICODIN) 5-325 MG tablet Take 1-2 tablets by mouth every 4 (four) hours as needed for moderate pain or severe pain. 15 tablet 0  . insulin detemir (LEVEMIR) 100 UNIT/ML injection Inject 12 Units into the skin at bedtime. And on sliding scale    . LEVEMIR FLEXTOUCH 100 UNIT/ML Pen INJECT 5 UNITS INTO THE SKIN AT BEDTIME AS DIRECTED. (Patient taking differently: INJECT 12 UNITS INTO THE SKIN AT BEDTIME) 15 mL 3  . metolazone (ZAROXOLYN) 2.5 MG tablet TAKE 1 TABLET ON M, W, FRI. ONLY; TAKE  THIS WITH YOUR LASIX; YOU WILL ALSO NEED TO TAKE AN EXTRA POTASSIUM ON THESE DAYS AS WELL (Patient taking differently: Take 2.5 mg by mouth every Monday, Wednesday, and Friday. In the morning) 27 tablet 4  . metoprolol succinate (TOPROL-XL) 25 MG 24 hr tablet TAKE 2 TABLETS BY MOUTH EVERY MORNING AND THEN ONE TABLET BY MOUTH EVERY EVENING (Patient taking differently: TAKE 2 TABLETS (50 MG) BY MOUTH EVERY MORNING AND THEN ONE TABLET (25 MG) BY MOUTH EVERY EVENING) 90 tablet 3  . Multiple Vitamin (MULTIVITAMIN WITH MINERALS) TABS tablet Take 1 tablet by mouth daily. FOR ADULT 50+    . Omega-3 Fatty Acids (FISH OIL) 1000 MG CAPS Take 1,000 mg by mouth 2 (two) times daily.    . pantoprazole (PROTONIX) 40 MG tablet TAKE 1 TABLET (40 MG TOTAL) BY MOUTH DAILY. 90 tablet 1  . Potassium Chloride ER 20 MEQ TBCR TAKE 2 TABLETS (40 MEQ TOTAL) BY MOUTH 2 (TWO) TIMES DAILY. 120 tablet 10  . warfarin (COUMADIN) 5 MG tablet TAKE AS DIRECTED BY ANTICOAGULATION CLINIC (Patient taking differently: TAKE 0.5 TABLET (2.5 MG) BY MOUTH ON SUNDAY, TUESDAY, THURSDAY, & SATURDAY. TAKE 1  TABLET (5 MG) ON MONDAY, WEDNESDAY, & FRIDAY) 30 tablet 5   No current facility-administered medications for this visit.     OBJECTIVE: Elderly white woman who appears stated age  75:   09/27/16 0947  BP: (!) 141/63  Pulse: 90  Resp: 18  Temp: 97.5 F (36.4 C)     Body mass index is 26.43 kg/m.    ECOG FS:1 - Symptomatic but completely ambulatory  Sclerae unicteric, pupils round and equal Oropharynx clear and moist No cervical or supraclavicular adenopathy Lungs no rales or rhonchi Heart regular rate and rhythm Abd soft, nontender, positive bowel sounds MSK kyphosis but no focal spinal tenderness, no upper extremity lymphedema Neuro: nonfocal, well oriented, pleasant affect Breasts: The right breast is unremarkable. The left breast is status post recent lumpectomy. The cosmetic result is good. There is some ecchymosis associated with the recent procedure, but no evidence of residual or recurrent disease. Both axillae are benign.   LAB RESULTS:  CMP     Component Value Date/Time   NA 134 (L) 09/14/2016 1404   NA 137 08/23/2016 1452   K 3.4 (L) 09/14/2016 1404   K 3.9 08/23/2016 1452   CL 99 (L) 09/14/2016 1404   CO2 23 09/14/2016 1404   CO2 24 08/23/2016 1452   GLUCOSE 183 (H) 09/14/2016 1404   GLUCOSE 148 (H) 08/23/2016 1452   BUN 66 (H) 09/14/2016 1404   BUN 70.4 (H) 08/23/2016 1452   CREATININE 2.58 (H) 09/14/2016 1404   CREATININE 2.8 (H) 08/23/2016 1452   CALCIUM 9.4 09/14/2016 1404   CALCIUM 10.6 (H) 08/23/2016 1452   PROT 8.2 08/23/2016 1452   ALBUMIN 3.7 08/23/2016 1452   AST 22 08/23/2016 1452   ALT 16 08/23/2016 1452   ALKPHOS 56 08/23/2016 1452   BILITOT 0.65 08/23/2016 1452   GFRNONAA 17 (L) 09/14/2016 1404   GFRAA 20 (L) 09/14/2016 1404    INo results found for: SPEP, UPEP  Lab Results  Component Value Date   WBC 7.7 09/14/2016   NEUTROABS 5.7 08/23/2016   HGB 12.5 09/14/2016   HCT 37.2 09/14/2016   MCV 86.7 09/14/2016   PLT 186  09/14/2016      Chemistry      Component Value Date/Time   NA 134 (L) 09/14/2016 1404   NA 137  08/23/2016 1452   K 3.4 (L) 09/14/2016 1404   K 3.9 08/23/2016 1452   CL 99 (L) 09/14/2016 1404   CO2 23 09/14/2016 1404   CO2 24 08/23/2016 1452   BUN 66 (H) 09/14/2016 1404   BUN 70.4 (H) 08/23/2016 1452   CREATININE 2.58 (H) 09/14/2016 1404   CREATININE 2.8 (H) 08/23/2016 1452      Component Value Date/Time   CALCIUM 9.4 09/14/2016 1404   CALCIUM 10.6 (H) 08/23/2016 1452   ALKPHOS 56 08/23/2016 1452   AST 22 08/23/2016 1452   ALT 16 08/23/2016 1452   BILITOT 0.65 08/23/2016 1452       No results found for: LABCA2  No components found for: ELTRV202  No results for input(s): INR in the last 168 hours.  Urinalysis    Component Value Date/Time   COLORURINE LT. YELLOW 07/01/2011 [place] 07/01/2011 1203   LABSPEC 1.010 07/01/2011 1203   PHURINE 6.0 07/01/2011 1203   GLUCOSEU NEGATIVE 07/01/2011 1203   HGBUR TRACE-LYSED 07/01/2011 1203   BILIRUBINUR neg 05/02/2013 1215   KETONESUR NEGATIVE 07/01/2011 1203   PROTEINUR 1 05/02/2013 1215   PROTEINUR NEGATIVE 09/15/2010 0449   UROBILINOGEN 0.2 05/02/2013 1215   UROBILINOGEN 0.2 07/01/2011 1203   NITRITE neg 05/02/2013 1215   NITRITE NEGATIVE 07/01/2011 1203   LEUKOCYTESUR moderate (2+) 05/02/2013 1215     STUDIES: Mm Breast Surgical Specimen  Result Date: 09/19/2016 CLINICAL DATA:  Status post lumpectomy after earlier radioactive seed localization. EXAM: SPECIMEN RADIOGRAPH OF THE LEFT BREAST COMPARISON:  Previous exam(s). FINDINGS: Status post excision of the left breast. The radioactive seed and biopsy marker clip are present, completely intact, and were marked for pathology. The positions of the radioactive seed and biopsy marker clip within the specimen were discussed with the OR staff during the procedure. IMPRESSION: Specimen radiograph of the left breast. Electronically Signed   By: Franki Cabot  M.D.   On: 09/19/2016 15:46   Mm Lt Radioactive Seed Loc Mammo Guide  Result Date: 09/16/2016 CLINICAL DATA:  Patient presents for radioactive seed localization and subsequent surgical excision of biopsy-proven DCIS left breast. EXAM: MAMMOGRAPHIC GUIDED RADIOACTIVE SEED LOCALIZATION OF THE LEFT BREAST COMPARISON:  Previous exam(s). FINDINGS: Patient presents for radioactive seed localization prior to surgical excision. I met with the patient and we discussed the procedure of seed localization including benefits and alternatives. We discussed the high likelihood of a successful procedure. We discussed the risks of the procedure including infection, bleeding, tissue injury and further surgery. We discussed the low dose of radioactivity involved in the procedure. Informed, written consent was given. The usual time-out protocol was performed immediately prior to the procedure. Using mammographic guidance, sterile technique, 1% lidocaine and an I-125 radioactive seed, the targeted X shaped metallic clip was was localized using a superior to inferior approach. The follow-up mammogram images confirm the seed in the expected location and were marked for Dr. Marlou Starks. The radioactive seed lies immediately adjacent the targeted metallic clip. Follow-up survey of the patient confirms presence of the radioactive seed. Order number of I-125 seed:  [id number]. Total activity:  0.247 mCi  Reference Date: 08/25/2016 The patient tolerated the procedure well and was released from the [facility]. She was given instructions regarding seed removal. IMPRESSION: Radioactive seed localization left breast. No apparent complications. Electronically Signed   By: Marin Olp M.D.   On: 09/16/2016 14:48    ELIGIBLE FOR AVAILABLE RESEARCH PROTOCOL: COMET/ discussed 08/23/2016; patient declines participation  ASSESSMENT: 76 y.[address]. [address] woman status post left breast upper inner quadrant biopsy 07/29/2016 for ductal carcinoma in situ,  grade 2, estrogen and progesterone receptor positive  (1) Genetics testing 08/23/2016 through the Multi-Gene Panel offered by [facility] found no deleterious mutations in ALK, APC, ATM, AXIN2,BAP1,  BARD1, BLM, BMPR1A, BRCA1, BRCA2, BRIP1, CASR, CDC73, CDH1, CDK4, CDKN1B, CDKN1C, CDKN2A (p14ARF), CDKN2A (p16INK4a), CEBPA, CHEK2, DICER1, CIS3L2, EGFR (c.2369C>T, p.Thr790Met variant only), EPCAM (Deletion/duplication testing only), FH, FLCN, GATA2, GPC3, GREM1 (Promoter region deletion/duplication testing only), HOXB13 (c.251G>A, p.Gly84Glu), HRAS, KIT, MAX, MEN1, MET, MITF (c.952G>A, p.Glu318Lys variant only), MLH1, MSH2, MSH6, MUTYH, NBN, NF1, NF2, PALB2, PDGFRA, PHOX2B, PMS2, POLD1, POLE, POT1, PRKAR1A, PTCH1, PTEN, RAD50, RAD51C, RAD51D, RB1, RECQL4, RET, RUNX1, SDHAF2, SDHA (sequence changes only), SDHB, SDHC, SDHD, SMAD4, SMARCA4, SMARCB1, SMARCE1, STK11, SUFU, TERT, TERT, TMEM127, TP53, TSC1, TSC2, VHL, WRN and WT1  (2) considered the COMET trial: patient declines  (3) left lumpectomy 09/19/2016 found ductal carcinoma in situ, grade 2, measuring 0.6 cm, with negative margins.  (4) advised against adjuvant radiation since there would be no survival advantage in this setting and the patient's pacemaker would have to be moved  (5) anastrozole started 09/27/2016  (a) bone density 08/25/2015 found a T score of -2.7, consistent with osteoporosis   (b) denosumab/[place] has started 10/25/2016, repeated every 24 week  PLAN: I spent approximately 30 minutes with Letta Median with most of that time spent discussing her overall situation and questions. She has done very well with her surgery, and she was not found to have any invasive disease. She understands this is very favorable as noninvasive breast cancer is in itself is not life-threatening.  She is at risk for local recurrence. Normally we recommend radiation after surgery but in her case we have felt this would not be a good idea given the need to relocate  her pacemaker and other issues. Accordingly we are recommending anti-estrogens. Given her overall situation, tamoxifen with its risk of blood clots would not be a good option for her.  That leads [place] to anastrozole. We discussed the possible toxicities, side effects and complications of this agent and particularly the issue of osteoporosis.  She really has osteoporosis as documented by her bone density scan last year. Anastrozole could make this worse.  Accordingly, if we are going to do anastrozole, we also need to address the osteoporosis issue. We discussed several alternatives. I think the best 1 given her kidney and other comorbidities I would be pearly. She would receive this every 6 months as a "shot". She understands the possible toxicities, side effects and complications of this agent and I specifically have asked her to take some extra calcium and to treatment today to prevent hypocalcemia problems.  [place] has a good understanding of the overall plan. She will start anastrozole today. That will be continued for 5 years. She will let me know if she develops any unusual side effects. She will start her Prolia on 10/25/2016. This will be continued for 2 years. She will see me sometime in June. If all is going well I will then set her up for repeat Prolia in November and a repeat visit April of next year  She knows to call for any other problems that may develop before her next visit here.    Chauncey Cruel, MD   09/27/2016 10:30 AM Medical Oncology and Hematology [facility] [address] [place], [place] [address] Tel. [phone]    Fax. [phone]

## 2016-09-28 ENCOUNTER — Encounter (HOSPITAL_COMMUNITY): Payer: Self-pay

## 2016-09-29 ENCOUNTER — Encounter: Payer: Self-pay | Admitting: *Deleted

## 2016-10-05 ENCOUNTER — Other Ambulatory Visit: Payer: Self-pay | Admitting: Family Medicine

## 2016-10-05 LAB — CBC AND DIFFERENTIAL
HCT: 13 % — AB (ref 36–46)
Hemoglobin: 13.1 g/dL (ref 12.0–16.0)
NEUTROS ABS: 6 /uL
PLATELETS: 230 10*3/uL (ref 150–399)
WBC: 8 10^3/mL

## 2016-10-05 LAB — BASIC METABOLIC PANEL
BUN: 53 mg/dL — AB (ref 4–21)
Creatinine: 3 mg/dL — AB (ref 0.5–1.1)
GLUCOSE: 103 mg/dL
Potassium: 3.9 mmol/L (ref 3.4–5.3)
SODIUM: 134 mmol/L — AB (ref 137–147)

## 2016-10-05 LAB — HEPATIC FUNCTION PANEL
ALT: 16 U/L (ref 7–35)
AST: 24 U/L (ref 13–35)
Alkaline Phosphatase: 57 U/L (ref 25–125)
BILIRUBIN, TOTAL: 0.4 mg/dL

## 2016-10-07 ENCOUNTER — Other Ambulatory Visit: Payer: Self-pay | Admitting: Family Medicine

## 2016-10-07 ENCOUNTER — Other Ambulatory Visit: Payer: Self-pay | Admitting: General Practice

## 2016-10-07 MED ORDER — WARFARIN SODIUM 5 MG PO TABS: ORAL_TABLET | ORAL | 3 refills | Status: DC

## 2016-10-08 ENCOUNTER — Other Ambulatory Visit: Payer: Self-pay | Admitting: Internal Medicine

## 2016-10-10 ENCOUNTER — Ambulatory Visit (INDEPENDENT_AMBULATORY_CARE_PROVIDER_SITE_OTHER): Payer: Medicare Other | Admitting: General Practice

## 2016-10-10 DIAGNOSIS — Z5181 Encounter for therapeutic drug level monitoring: Secondary | ICD-10-CM

## 2016-10-10 LAB — POCT INR: INR: 2.2

## 2016-10-10 NOTE — Patient Instructions (Signed)
Pre visit review using our clinic review tool, if applicable. No additional management support is needed unless otherwise documented below in the visit note. 

## 2016-10-10 NOTE — Progress Notes (Signed)
I agree with this plan.

## 2016-10-14 ENCOUNTER — Encounter: Payer: Self-pay | Admitting: Family Medicine

## 2016-10-21 ENCOUNTER — Telehealth: Payer: Self-pay | Admitting: *Deleted

## 2016-10-21 ENCOUNTER — Other Ambulatory Visit: Payer: Self-pay

## 2016-10-21 ENCOUNTER — Telehealth: Payer: Self-pay

## 2016-10-21 DIAGNOSIS — E1122 Type 2 diabetes mellitus with diabetic chronic kidney disease: Secondary | ICD-10-CM

## 2016-10-21 DIAGNOSIS — E1165 Type 2 diabetes mellitus with hyperglycemia: Secondary | ICD-10-CM

## 2016-10-21 DIAGNOSIS — D0512 Intraductal carcinoma in situ of left breast: Secondary | ICD-10-CM

## 2016-10-21 DIAGNOSIS — Z794 Long term (current) use of insulin: Secondary | ICD-10-CM

## 2016-10-21 DIAGNOSIS — N183 Chronic kidney disease, stage 3 (moderate): Secondary | ICD-10-CM

## 2016-10-21 DIAGNOSIS — IMO0002 Reserved for concepts with insufficient information to code with codable children: Secondary | ICD-10-CM

## 2016-10-21 NOTE — Telephone Encounter (Signed)
Spoke with pt by phone to inform her of appt added for labs at 099 on 5/1

## 2016-10-21 NOTE — Telephone Encounter (Signed)
  Oncology Nurse Navigator Documentation  Navigator Location: [facility] (10/21/16 1600)   )Navigator Encounter Type: Telephone (10/21/16 1600) Telephone: [facility] Crocker Call;Appt Confirmation/Clarification (10/21/16 1600)                                                  Time Spent with Patient: 15 (10/21/16 1600)

## 2016-10-25 ENCOUNTER — Other Ambulatory Visit (HOSPITAL_BASED_OUTPATIENT_CLINIC_OR_DEPARTMENT_OTHER): Payer: Medicare Other

## 2016-10-25 ENCOUNTER — Ambulatory Visit (HOSPITAL_BASED_OUTPATIENT_CLINIC_OR_DEPARTMENT_OTHER): Payer: Medicare Other | Admitting: Adult Health

## 2016-10-25 ENCOUNTER — Encounter: Payer: Self-pay | Admitting: Adult Health

## 2016-10-25 ENCOUNTER — Other Ambulatory Visit: Payer: Self-pay | Admitting: *Deleted

## 2016-10-25 ENCOUNTER — Ambulatory Visit (HOSPITAL_BASED_OUTPATIENT_CLINIC_OR_DEPARTMENT_OTHER): Payer: Medicare Other

## 2016-10-25 VITALS — BP 141/59 | HR 87 | Temp 97.7°F | Resp 18 | Ht 63.5 in | Wt 150.3 lb

## 2016-10-25 DIAGNOSIS — D0512 Intraductal carcinoma in situ of left breast: Secondary | ICD-10-CM

## 2016-10-25 DIAGNOSIS — N183 Chronic kidney disease, stage 3 (moderate): Secondary | ICD-10-CM

## 2016-10-25 DIAGNOSIS — E1165 Type 2 diabetes mellitus with hyperglycemia: Secondary | ICD-10-CM

## 2016-10-25 DIAGNOSIS — M810 Age-related osteoporosis without current pathological fracture: Secondary | ICD-10-CM

## 2016-10-25 DIAGNOSIS — I5043 Acute on chronic combined systolic (congestive) and diastolic (congestive) heart failure: Secondary | ICD-10-CM

## 2016-10-25 DIAGNOSIS — Z794 Long term (current) use of insulin: Secondary | ICD-10-CM

## 2016-10-25 DIAGNOSIS — E1122 Type 2 diabetes mellitus with diabetic chronic kidney disease: Secondary | ICD-10-CM

## 2016-10-25 DIAGNOSIS — IMO0002 Reserved for concepts with insufficient information to code with codable children: Secondary | ICD-10-CM

## 2016-10-25 LAB — CBC WITH DIFFERENTIAL/PLATELET
BASO%: 0.8 % (ref 0.0–2.0)
BASOS ABS: 0.1 10*3/uL (ref 0.0–0.1)
EOS%: 1.5 % (ref 0.0–7.0)
Eosinophils Absolute: 0.1 10*3/uL (ref 0.0–0.5)
HEMATOCRIT: 38.3 % (ref 34.8–46.6)
HGB: 12.7 g/dL (ref 11.6–15.9)
LYMPH%: 18.9 % (ref 14.0–49.7)
MCH: 28.8 pg (ref 25.1–34.0)
MCHC: 33.2 g/dL (ref 31.5–36.0)
MCV: 86.7 fL (ref 79.5–101.0)
MONO#: 0.6 10*3/uL (ref 0.1–0.9)
MONO%: 7.8 % (ref 0.0–14.0)
NEUT#: 5.8 10*3/uL (ref 1.5–6.5)
NEUT%: 71 % (ref 38.4–76.8)
Platelets: 198 10*3/uL (ref 145–400)
RBC: 4.41 10*6/uL (ref 3.70–5.45)
RDW: 14.6 % — ABNORMAL HIGH (ref 11.2–14.5)
WBC: 8.1 10*3/uL (ref 3.9–10.3)
lymph#: 1.5 10*3/uL (ref 0.9–3.3)

## 2016-10-25 LAB — COMPREHENSIVE METABOLIC PANEL
ALBUMIN: 3.6 g/dL (ref 3.5–5.0)
ALK PHOS: 58 U/L (ref 40–150)
ALT: 15 U/L (ref 0–55)
AST: 23 U/L (ref 5–34)
Anion Gap: 10 mEq/L (ref 3–11)
BILIRUBIN TOTAL: 0.51 mg/dL (ref 0.20–1.20)
BUN: 56.2 mg/dL — AB (ref 7.0–26.0)
CO2: 25 meq/L (ref 22–29)
CREATININE: 2.7 mg/dL — AB (ref 0.6–1.1)
Calcium: 10.3 mg/dL (ref 8.4–10.4)
Chloride: 102 mEq/L (ref 98–109)
EGFR: 16 mL/min/{1.73_m2} — AB (ref >90)
GLUCOSE: 124 mg/dL (ref 70–140)
Potassium: 4.3 mEq/L (ref 3.5–5.1)
SODIUM: 137 meq/L (ref 136–145)
TOTAL PROTEIN: 7.9 g/dL (ref 6.4–8.3)

## 2016-10-25 MED ORDER — DENOSUMAB 60 MG/ML ~~LOC~~ SOLN
60.0000 mg | Freq: Once | SUBCUTANEOUS | Status: AC
  Administered 2016-10-25: 60 mg via SUBCUTANEOUS
  Filled 2016-10-25: qty 1

## 2016-10-25 NOTE — Progress Notes (Signed)
CLINIC:  Survivorship   REASON FOR VISIT:  Routine follow-up post-treatment for a recent history of breast cancer.  BRIEF ONCOLOGIC HISTORY:    Ductal carcinoma in situ (DCIS) of left breast   07/29/2016 Initial Biopsy    Left breast upper inner needle core biopsy: DCIS with calcifications, intermediate grade, ER+(100%), PR+(100%).      08/23/2016 Initial Diagnosis    Ductal carcinoma in situ (DCIS) of left breast     08/23/2016 Genetic Testing    Genetic testing was normal and did not reveal a deleterious mutation.  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 This means an individual needs to carry two copies of this variant to have the condition. 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 While this variant has been seen in one family with VHL, there have been several studies that have shown it is not associated with VHL.  Genes tested include: ALK, APC, ATM, AXIN2,BAP1,  BARD1, BLM, BMPR1A, BRCA1, BRCA2, BRIP1, CASR, CDC73, CDH1, CDK4, CDKN1B, CDKN1C, CDKN2A (p14ARF), CDKN2A (p16INK4a), CEBPA, CHEK2, DICER1, CIS3L2, EGFR (c.2369C>T, p.Thr790Met variant only), EPCAM (Deletion/duplication testing only), FH, FLCN, GATA2, GPC3, GREM1 (Promoter region deletion/duplication testing only), HOXB13 (c.251G>A, p.Gly84Glu), HRAS, KIT, MAX, MEN1, MET, MITF (c.952G>A, p.Glu318Lys variant only), MLH1, MSH2, MSH6, MUTYH, NBN, NF1, NF2, PALB2, PDGFRA, PHOX2B, PMS2, POLD1, POLE, POT1, PRKAR1A, PTCH1, PTEN, RAD50, RAD51C, RAD51D, RB1, RECQL4, RET, RUNX1, SDHAF2, SDHA (sequence changes only), SDHB, SDHC, SDHD, SMAD4, SMARCA4, SMARCB1, SMARCE1, STK11, SUFU, TERT, TERT, TMEM127, TP53, TSC1, TSC2, VHL, WRN and WT1      09/19/2016 Surgery    Left breast lumpectomy  Marlou Starks): DCIS with calcifications, 0.6 cm, intermediate grade, margins negative,       09/2016 -  Anti-estrogen oral therapy    Anastrozole 50m daily (Magrinat).  Bone density 08/25/2015, T score -2.7.  Patient receiving Prolia.        Genetic testing   09/07/2016 Initial Diagnosis    Genetic testing was negative for pathogenic variants known to be associated with a hereditary risk of cancer. Test ordered was the Multi-Gene Panel offered by [facility], which included sequencing and/or deletion duplication testing of the following 80 genes: ALK, APC, ATM, AXIN2,BAP1,  BARD1, BLM, BMPR1A, BRCA1, BRCA2, BRIP1, CASR, CDC73, CDH1, CDK4, CDKN1B, CDKN1C, CDKN2A (p14ARF), CDKN2A (p16INK4a), CEBPA, CHEK2, DICER1, CIS3L2, EGFR (c.2369C>T, p.Thr790Met variant only), EPCAM (Deletion/duplication testing only), FH, FLCN, GATA2, GPC3, GREM1 (Promoter region deletion/duplication testing only), HOXB13 (c.251G>A, p.Gly84Glu), HRAS, KIT, MAX, MEN1, MET, MITF (c.952G>A, p.Glu318Lys variant only), MLH1, MSH2, MSH6, MUTYH, NBN, NF1, NF2, PALB2, PDGFRA, PHOX2B, PMS2, POLD1, POLE, POT1, PRKAR1A, PTCH1, PTEN, RAD50, RAD51C, RAD51D, RB1, RECQL4, RET, RUNX1, SDHAF2, SDHA (sequence changes only), SDHB, SDHC, SDHD, SMAD4, SMARCA4, SMARCB1, SMARCE1, STK11, SUFU, TERT, TERT, TMEM127, TP53, TSC1, TSC2, VHL, WRN and Wt1. Date of test report is 09/06/16.   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 This particular variant is associated with autosomal recessive familial erythrocytosis, type 2. It is not currently believed to be associated with von Hippel-Lindau syndrome. 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        INTERVAL HISTORY:  Ms. SBarbour  presents to the Survivorship Clinic today for our initial meeting to review her survivorship care plan detailing her treatment course for breast  cancer, as well as monitoring long-term side effects of that treatment, education regarding health maintenance, screening, and overall wellness and health promotion.       REVIEW OF SYSTEMS:  Review of Systems  Constitutional: Negative for appetite change, chills, diaphoresis, fatigue, fever and unexpected weight change.  HENT:   Negative for hearing loss, lump/mass, mouth sores, nosebleeds and sore throat.   Eyes: Negative for eye problems and icterus.  Respiratory: Negative for chest tightness, cough and shortness of breath.   Cardiovascular: Negative for chest pain and leg swelling.  Gastrointestinal: Negative for abdominal distention and abdominal pain.  Genitourinary: Negative for difficulty urinating.   Musculoskeletal: Negative for arthralgias and gait problem.  Skin: Negative for itching and rash.  Neurological: Negative for dizziness and gait problem.  Hematological: Negative for adenopathy. Does not bruise/bleed easily.  Psychiatric/Behavioral: Negative for depression. The patient is not nervous/anxious.   Breast: Denies any new nodularity, masses, tenderness, nipple changes, or nipple discharge.     ONCOLOGY TREATMENT TEAM:  1. Surgeon:  Dr. Marlou [facility]arks at [facility] 2. Medical Oncologist: Dr. Jana Hakim     PAST MEDICAL/SURGICAL HISTORY:  Past Medical History:  Diagnosis Date  . Anemia   . Anemia   . Arthritis    gout  . Atrial fibrillation ([place])    prior Sotalol - d/c'd 2/2 increased creatinine; rate control strategy  . CAD (coronary artery disease)    s/p PCI in [place] in 1/09  . Cancer [facility])    left breast  . Chronic combined systolic and diastolic heart failure (HCC)    Echocardiogram 3/12: Mild LVH, EF 31-54%, normal diastolic function, mild AI, mild MR, PASP 44, normal wall motion  . CKD (chronic kidney disease)    sees Dr. Burman Foster - Stage 3 ?  Marland Kitchen Diabetes mellitus   . Hyperlipemia   . Hypertension   . MI, old    2009  . Osteoporosis   .  Pacemaker   . Pleural effusion   . Pneumonia   . PUD (peptic ulcer disease) 10/2010   duodenal ulcer   Past Surgical History:  Procedure Laterality Date  . BREAST LUMPECTOMY WITH RADIOACTIVE SEED LOCALIZATION Left 09/19/2016   Procedure: LEFT BREAST LUMPECTOMY WITH RADIOACTIVE SEED LOCALIZATION;  Surgeon: Autumn Messing III, MD;  Location: [place];  Service: General;  Laterality: Left;  . CATARACT EXTRACTION Bilateral    01/12/09 and 01/26/09 both eyes  . COLONOSCOPY    . CORONARY ANGIOPLASTY  2009  . PACEMAKER INSERTION  07/13/07  . stent implant  07/13/2007     ALLERGIES:  Allergies  Allergen Reactions  . Amlodipine Besylate Swelling    edema     CURRENT MEDICATIONS:  Outpatient Encounter Prescriptions as of 10/25/2016  Medication Sig Note  . anastrozole (ARIMIDEX) 1 MG tablet Take 1 tablet (1 mg total) by mouth daily.   Marland Kitchen atorvastatin (LIPITOR) 40 MG tablet TAKE 1 TABLET (40 MG TOTAL) BY MOUTH DAILY. (Patient taking differently: Take 40 mg by mouth at bedtime. )   . calcitRIOL (ROCALTROL) 0.25 MCG capsule Take 0.25 mcg by mouth every Monday, Wednesday, and Friday. In the morning.   . colchicine 0.6 MG tablet TAKE 1 TABLET (0.6 MG TOTAL) BY MOUTH AS NEEDED AS DIRECTED. (Patient taking differently: TAKE 1 TABLET (0.6 MG TOTAL) BY MOUTH DAILY AS NEEDED FOR GOUT FLARE UPS.)   .  Cranberry 500 MG CAPS Take 500 mg by mouth daily with lunch.   . furosemide (LASIX) 40 MG tablet TAKE TWO TABLETS BY MOUTH EVERY MORNING AND TAKE ONE TABLET BY MOUTH EVERY EVENING (Patient taking differently: TAKE TWO TABLETS (80 MG) BY MOUTH EVERY MORNING AND TAKE ONE TABLET (40 MG) BY MOUTH EVERY EVENING)   . glucose blood test strip 1 each by Other route 3 (three) times daily. Use as instructed    . HYDROcodone-acetaminophen (NORCO/VICODIN) 5-325 MG tablet Take 1-2 tablets by mouth every 4 (four) hours as needed for moderate pain or severe pain.   Marland Kitchen insulin detemir (LEVEMIR) 100 UNIT/ML injection Inject 12 Units into  the skin at bedtime. And on sliding scale   . LEVEMIR FLEXTOUCH 100 UNIT/ML Pen INJECT 5 UNITS INTO THE SKIN AT BEDTIME AS DIRECTED. (Patient taking differently: INJECT 12 UNITS INTO THE SKIN AT BEDTIME)   . metolazone (ZAROXOLYN) 2.5 MG tablet TAKE 1 TABLET ON M, W, FRI. ONLY; TAKE THIS WITH YOUR LASIX; YOU WILL ALSO NEED TO TAKE AN EXTRA POTASSIUM ON THESE DAYS AS WELL (Patient taking differently: Take 2.5 mg by mouth every Monday, Wednesday, and Friday. In the morning)   . metoprolol succinate (TOPROL-XL) 25 MG 24 hr tablet TAKE 2 TABLETS BY MOUTH EVERY MORNING AND THEN ONE TABLET BY MOUTH EVERY EVENING   . Multiple Vitamin (MULTIVITAMIN WITH MINERALS) TABS tablet Take 1 tablet by mouth daily. FOR ADULT 50+   . Omega-3 Fatty Acids (FISH OIL) 1000 MG CAPS Take 1,000 mg by mouth 2 (two) times daily. 09/13/2016: AFTER BREAKFAST & AFTER LUNCH  . pantoprazole (PROTONIX) 40 MG tablet TAKE 1 TABLET (40 MG TOTAL) BY MOUTH DAILY.   Marland Kitchen Potassium Chloride ER 20 MEQ TBCR TAKE TWO TABLETS BY MOUTH TWICE A DAY   . warfarin (COUMADIN) 5 MG tablet TAKE AS DIRECTED BY ANTICOAGULATION CLINIC    No facility-administered encounter medications on file as of 10/25/2016.      ONCOLOGIC FAMILY HISTORY:  Family History  Problem Relation Age of Onset  . Heart disease Father   . Heart attack Father   . Clotting disorder Father     blood clot  . Hypertension Father   . Heart disease Mother   . Breast cancer Maternal Aunt 53  . Breast cancer Sister 101  . Breast cancer Other 40  . Colon cancer Neg Hx      GENETIC COUNSELING/TESTING: See above  SOCIAL HISTORY:  SHAMBRIA CAMERER is widowed and lives alone with her cat in [place], [place].  She has 1 daughter who lives in [place].  Ms. Nath is currently retired.  She denies any current or history of tobacco, alcohol, or illicit drug use.     PHYSICAL EXAMINATION:  Vital Signs:   Vitals:   10/25/16 1034  BP: (!) 141/59  Pulse: 87  Resp: 18  Temp:  97.7 F (36.5 C)   Filed Weights   10/25/16 1034  Weight: 150 lb 4.8 oz (68.2 kg)   General: Well-nourished, well-appearing female in no acute distress.  She is unaccompanied today.   HEENT: Head is normocephalic.  Pupils equal and reactive to light. Conjunctivae clear without exudate.  Sclerae anicteric. Oral mucosa is pink, moist.  Oropharynx is pink without lesions or erythema.  Lymph: No cervical, supraclavicular, or infraclavicular lymphadenopathy noted on palpation.  Cardiovascular: Regular rate and rhythm.Marland Kitchen Respiratory: Clear to auscultation bilaterally. Chest expansion symmetric; breathing non-labored.  GI: Abdomen soft and round; non-tender, non-distended. Bowel  sounds normoactive.  GU: Deferred.  Neuro: No focal deficits. Steady gait.  Psych: Mood and affect normal and appropriate for situation.  Extremities: No edema. Skin: Warm and dry.  LABORATORY DATA:  None for this visit.  DIAGNOSTIC IMAGING:  None for this visit.   ASSESSMENT AND PLAN:  Ms.. Hefner is a pleasant 76 y.o. female with Stage 0 left breast DCIS, ER+/PR+/HER2-, diagnosed in 07/2016, treated with lumpectomy,  and anti-estrogen therapy with Anastrozole beginning in 09/2016.  She presents to the Survivorship Clinic for our initial meeting and routine follow-up post-completion of treatment for breast cancer.    1. Stage 0 left breast cancer:  Ms. Vaughn is continuing to recover from definitive treatment for breast cancer. She will follow-up with her medical oncologist, Dr. Jana Hakim in June, 2018 with history and physical exam per surveillance protocol.  She will continue her anti-estrogen therapy with Anastrozole. Thus far, she is tolerating the Anastrozole well, with minimal side effects.  Today, a comprehensive survivorship care plan and treatment summary was reviewed with the patient today detailing her breast cancer diagnosis, treatment course, potential late/long-term effects of treatment, appropriate follow-up  care with recommendations for the future, and patient education resources.  A copy of this summary, along with a letter will be sent to the patient's primary care provider via mail/fax/In Basket message after today's visit.    2. Bone health:  Given Ms. Moulder's age/history of breast cancer, diagnosis of osteoporosis and her current treatment regimen including anti-estrogen therapy with Anastrozole, she is at risk for further bone demineralization.  Her last DEXA scan was 08/24/2016, which showed a T score of -2.7.  She was started on Prolia and will receive her first injection today.  She was encouraged to increase her consumption of foods rich in calcium, take a calcium supplement, as well as increase her weight-bearing activities.  She was given education on specific activities to promote bone health.  3. Cancer screening:  Due to Ms. Helbing's history and her age, she should receive screening for skin cancers, colon cancer, and gynecologic cancers.  The information and recommendations are listed on the patient's comprehensive care plan/treatment summary and were reviewed in detail with the patient.    4. Health maintenance and wellness promotion: Ms. Redondo was encouraged to consume 5-7 servings of fruits and vegetables per day. We reviewed the "Nutrition Rainbow" handout, as well as the handout "Take Control of Your Health and Reduce Your Cancer Risk" from the [place].  She was also encouraged to engage in moderate to vigorous exercise for 30 minutes per day most days of the week. We discussed the [employer] fitness program, which is designed for cancer survivors to help them become more physically fit after cancer treatments.  She was instructed to limit her alcohol consumption and continue to abstain from tobacco use.    5. Support services/counseling: It is not uncommon for this period of the patient's cancer care trajectory to be one of many emotions and stressors.  We discussed an  opportunity for her to participate in the next session of [facility] ("Finding Your New Normal") support group series designed for patients after they have completed treatment.   Ms. Jetter was encouraged to take advantage of our many other support services programs, support groups, and/or counseling in coping with her new life as a cancer survivor after completing anti-cancer treatment.  She was offered support today through active listening and expressive supportive counseling.  She was given information regarding our available  services and encouraged to contact me with any questions or for help enrolling in any of our support group/programs.    Dispo:   -Return to cancer center in June for evaluation by Dr. Jana Hakim  -She is welcome to return back to the Survivorship Clinic at any time; no additional follow-up needed at this time.  -Consider referral back to survivorship as a long-term survivor for continued surveillance  A total of (30) minutes of face-to-face time was spent with this patient with greater than 50% of that time in counseling and care-coordination.   Gardenia Phlegm, [place] [phone]   Note: PRIMARY CARE PROVIDER Eulas Post, [place] [phone]

## 2016-10-25 NOTE — Progress Notes (Signed)
Injection was given by desk nurse for  [employer]

## 2016-10-31 ENCOUNTER — Encounter: Payer: Self-pay | Admitting: Internal Medicine

## 2016-11-04 ENCOUNTER — Encounter: Payer: Self-pay | Admitting: Family Medicine

## 2016-11-04 ENCOUNTER — Ambulatory Visit (INDEPENDENT_AMBULATORY_CARE_PROVIDER_SITE_OTHER): Payer: Medicare Other | Admitting: Family Medicine

## 2016-11-04 VITALS — BP 120/70 | HR 84 | Temp 98.4°F | Wt 152.6 lb

## 2016-11-04 DIAGNOSIS — N184 Chronic kidney disease, stage 4 (severe): Secondary | ICD-10-CM | POA: Diagnosis not present

## 2016-11-04 DIAGNOSIS — E1165 Type 2 diabetes mellitus with hyperglycemia: Secondary | ICD-10-CM

## 2016-11-04 DIAGNOSIS — I10 Essential (primary) hypertension: Secondary | ICD-10-CM | POA: Diagnosis not present

## 2016-11-04 DIAGNOSIS — IMO0002 Reserved for concepts with insufficient information to code with codable children: Secondary | ICD-10-CM

## 2016-11-04 DIAGNOSIS — Z794 Long term (current) use of insulin: Secondary | ICD-10-CM

## 2016-11-04 DIAGNOSIS — E1122 Type 2 diabetes mellitus with diabetic chronic kidney disease: Secondary | ICD-10-CM | POA: Diagnosis not present

## 2016-11-04 DIAGNOSIS — N183 Chronic kidney disease, stage 3 (moderate): Secondary | ICD-10-CM | POA: Diagnosis not present

## 2016-11-04 LAB — LIPID PANEL
Cholesterol: 158 mg/dL (ref 0–200)
HDL: 52.1 mg/dL (ref >39.00)
LDL Cholesterol: 79 mg/dL (ref 0–99)
NonHDL: 106.27
Total CHOL/HDL Ratio: 3
Triglycerides: 138 mg/dL (ref 0.0–149.0)
VLDL: 27.6 mg/dL (ref 0.0–40.0)

## 2016-11-04 NOTE — Progress Notes (Signed)
Subjective:     Patient ID: Yolanda Huffman, female   DOB: Mar 07, 1941, 76 y.o.   MRN: [id number]  HPI Patient several medical follow-up. She was diagnosed with breast cancer in the past year and underwent lumpectomy is now taking anastrozole. She's been getting regular follow-up with oncology and surgery.  Type 2 diabetes. A1c 6.5% March. She remains on low-dose Levemir 12 units once daily. No hypoglycemia. Appetite and weight are stable.  Hyperlipidemia treated with atorvastatin. She is due for lipids. She has history of atrial fibrillation on chronic Coumadin. Has chronic kidney disease followed by nephrology. Blood pressures have been stable. She does have history of CAD but no recent chest pains.  Past Medical History:  Diagnosis Date  . Anemia   . Anemia   . Arthritis    gout  . Atrial fibrillation ([place])    prior Sotalol - d/c'd 2/2 increased creatinine; rate control strategy  . CAD (coronary artery disease)    s/p PCI in [place] in 1/09  . Cancer [facility])    left breast  . Chronic combined systolic and diastolic heart failure (HCC)    Echocardiogram 3/12: Mild LVH, EF 10-93%, normal diastolic function, mild AI, mild MR, PASP 44, normal wall motion  . CKD (chronic kidney disease)    sees Dr. Burman Foster - Stage 3 ?  Marland Kitchen Diabetes mellitus   . Hyperlipemia   . Hypertension   . MI, old    2009  . Osteoporosis   . Pacemaker   . Pleural effusion   . Pneumonia   . PUD (peptic ulcer disease) 10/2010   duodenal ulcer   Past Surgical History:  Procedure Laterality Date  . BREAST LUMPECTOMY WITH RADIOACTIVE SEED LOCALIZATION Left 09/19/2016   Procedure: LEFT BREAST LUMPECTOMY WITH RADIOACTIVE SEED LOCALIZATION;  Surgeon: Autumn Messing III, MD;  Location: [place];  Service: General;  Laterality: Left;  . CATARACT EXTRACTION Bilateral    01/12/09 and 01/26/09 both eyes  . COLONOSCOPY    . CORONARY ANGIOPLASTY  2009  . PACEMAKER INSERTION  07/13/07  . stent implant  07/13/2007    reports that she  quit smoking about 24 years ago. Her smoking use included Cigarettes. She has a 40.00 pack-year smoking history. She has never used smokeless tobacco. She reports that she does not drink alcohol or use drugs. family history includes Breast cancer (age of onset: 3) in her other; Breast cancer (age of onset: 39) in her maternal aunt; Breast cancer (age of onset: 61) in her sister; Clotting disorder in her father; Heart attack in her father; Heart disease in her father and mother; Hypertension in her father. Allergies  Allergen Reactions  . Amlodipine Besylate Swelling    edema  \  Review of Systems  Constitutional: Negative for appetite change, fatigue and unexpected weight change.  Eyes: Negative for visual disturbance.  Respiratory: Negative for cough, chest tightness, shortness of breath and wheezing.   Cardiovascular: Negative for chest pain, palpitations and leg swelling.  Endocrine: Negative for polydipsia and polyuria.  Neurological: Negative for dizziness, seizures, syncope, weakness, light-headedness and headaches.       Objective:   Physical Exam  Constitutional: She appears well-developed and well-nourished.  Eyes: Pupils are equal, round, and reactive to light.  Neck: Neck supple. No JVD present. No thyromegaly present.  Cardiovascular: Normal rate and regular rhythm.  Exam reveals no gallop.   Pulmonary/Chest: Effort normal and breath sounds normal. No respiratory distress. She has no wheezes. She has no rales.  Musculoskeletal: She exhibits no edema.  Neurological: She is alert.  Skin:  Feet reveal no skin lesions. Good distal foot pulses. Good capillary refill. No calluses. Normal sensation with monofilament testing        Assessment:     #1 type 2 diabetes with good recent control  #2 hypertension stable and at goal  #3 chronic kidney disease  #4 dyslipidemia and history of CAD  #5 chronic atrial fibrillation on Coumadin    Plan:     -Recheck lipid panel.  We did not check hepatic or chemistries since she had these recently through oncology -Recent A1c 6.5% -Routine follow-up in 3 months and repeat A1c at that time  Eulas Post MD [facility]

## 2016-11-07 ENCOUNTER — Ambulatory Visit (INDEPENDENT_AMBULATORY_CARE_PROVIDER_SITE_OTHER): Payer: Medicare Other | Admitting: General Practice

## 2016-11-07 DIAGNOSIS — Z5181 Encounter for therapeutic drug level monitoring: Secondary | ICD-10-CM

## 2016-11-07 LAB — POCT INR: INR: 2.1

## 2016-11-07 NOTE — Patient Instructions (Signed)
Pre visit review using our clinic review tool, if applicable. No additional management support is needed unless otherwise documented below in the visit note. 

## 2016-11-15 ENCOUNTER — Encounter: Payer: Medicare Other | Admitting: Adult Health

## 2016-11-16 ENCOUNTER — Encounter: Payer: Self-pay | Admitting: Internal Medicine

## 2016-11-16 ENCOUNTER — Ambulatory Visit (INDEPENDENT_AMBULATORY_CARE_PROVIDER_SITE_OTHER): Payer: Medicare Other | Admitting: Internal Medicine

## 2016-11-16 VITALS — BP 120/68 | HR 88 | Ht 63.0 in | Wt 153.0 lb

## 2016-11-16 DIAGNOSIS — I482 Chronic atrial fibrillation, unspecified: Secondary | ICD-10-CM

## 2016-11-16 DIAGNOSIS — Z950 Presence of cardiac pacemaker: Secondary | ICD-10-CM

## 2016-11-16 DIAGNOSIS — I10 Essential (primary) hypertension: Secondary | ICD-10-CM

## 2016-11-16 NOTE — Patient Instructions (Addendum)
Medication Instructions:  Your physician recommends that you continue on your current medications as directed. Please refer to the Current Medication list given to you today.   Labwork: None Ordered   Testing/Procedures:  None Ordered    Follow-Up: Your physician wants you to follow-up in: 1 year with Dr. Lovena Le. You will receive a reminder letter in the mail two months in advance. If you don't receive a letter, please call our office to schedule the follow-up appointment.  Remote monitoring is used to monitor your Pacemaker of ICD from home. This monitoring reduces the number of office visits required to check your device to one time per year. It allows [place] to keep an eye on the functioning of your device to ensure it is working properly. You are scheduled for a device check from home on 02/15/17. You may send your transmission at any time that day. If you have a wireless device, the transmission will be sent automatically. After your physician reviews your transmission, you will receive a postcard with your next transmission date.  Start month battery checks on 12/17/16   Any Other Special Instructions Will Be Listed Below (If Applicable).     If you need a refill on your cardiac medications before your next appointment, please call your pharmacy.

## 2016-11-18 NOTE — Progress Notes (Signed)
HPI Yolanda Huffman returns today for follow-up. She is a very pleasant 76 year old woman with a history of sinus node dysfunction, and atrial fibrillation, status post permanent pacemaker insertion. She has done well in the interim. The patient denies chest pain and shortness of breath. No peripheral edema. She is sedentary. She has not had syncope. She does not experience palpitations. Her sodium intake has improved.  Allergies  Allergen Reactions  . Amlodipine Besylate Swelling    edema     Current Outpatient Prescriptions  Medication Sig Dispense Refill  . anastrozole (ARIMIDEX) 1 MG tablet Take 1 tablet (1 mg total) by mouth daily. 90 tablet 4  . atorvastatin (LIPITOR) 40 MG tablet TAKE 1 TABLET (40 MG TOTAL) BY MOUTH DAILY. (Patient taking differently: Take 40 mg by mouth at bedtime. ) 90 tablet 2  . calcitRIOL (ROCALTROL) 0.25 MCG capsule Take 0.25 mcg by mouth every Monday, Wednesday, and Friday. In the morning.    . colchicine 0.6 MG tablet TAKE 1 TABLET (0.6 MG TOTAL) BY MOUTH AS NEEDED AS DIRECTED. 30 tablet 4  . Cranberry 500 MG CAPS Take 500 mg by mouth daily with lunch.    . furosemide (LASIX) 40 MG tablet TAKE TWO TABLETS BY MOUTH EVERY MORNING AND TAKE ONE TABLET BY MOUTH EVERY EVENING (Patient taking differently: TAKE TWO TABLETS (80 MG) BY MOUTH EVERY MORNING AND TAKE ONE TABLET (40 MG) BY MOUTH EVERY EVENING) 270 tablet 1  . glucose blood test strip 1 each by Other route 3 (three) times daily. Use as instructed     . HYDROcodone-acetaminophen (NORCO/VICODIN) 5-325 MG tablet Take 1-2 tablets by mouth every 4 (four) hours as needed for moderate pain or severe pain. 15 tablet 0  . insulin detemir (LEVEMIR) 100 UNIT/ML injection Inject 12 Units into the skin at bedtime. And on sliding scale    . LEVEMIR FLEXTOUCH 100 UNIT/ML Pen INJECT 5 UNITS INTO THE SKIN AT BEDTIME AS DIRECTED. (Patient taking differently: INJECT 12 UNITS INTO THE SKIN AT BEDTIME) 15 mL 3  . metolazone  (ZAROXOLYN) 2.5 MG tablet TAKE 1 TABLET ON M, W, FRI. ONLY; TAKE THIS WITH YOUR LASIX; YOU WILL ALSO NEED TO TAKE AN EXTRA POTASSIUM ON THESE DAYS AS WELL (Patient taking differently: Take 2.5 mg by mouth every Monday, Wednesday, and Friday. In the morning) 27 tablet 4  . metoprolol succinate (TOPROL-XL) 25 MG 24 hr tablet TAKE 2 TABLETS BY MOUTH EVERY MORNING AND THEN ONE TABLET BY MOUTH EVERY EVENING 90 tablet 1  . Multiple Vitamin (MULTIVITAMIN WITH MINERALS) TABS tablet Take 1 tablet by mouth daily. FOR ADULT 50+    . Omega-3 Fatty Acids (FISH OIL) 1000 MG CAPS Take 1,000 mg by mouth 2 (two) times daily.    . pantoprazole (PROTONIX) 40 MG tablet TAKE 1 TABLET (40 MG TOTAL) BY MOUTH DAILY. 90 tablet 1  . Potassium Chloride ER 20 MEQ TBCR TAKE TWO TABLETS BY MOUTH TWICE A DAY 120 tablet 1  . warfarin (COUMADIN) 5 MG tablet TAKE AS DIRECTED BY ANTICOAGULATION CLINIC 30 tablet 3   No current facility-administered medications for this visit.      Past Medical History:  Diagnosis Date  . Anemia   . Anemia   . Arthritis    gout  . Atrial fibrillation ([place])    prior Sotalol - d/c'd 2/2 increased creatinine; rate control strategy  . CAD (coronary artery disease)    s/p PCI in [place] in 1/09  . Cancer (  [place])    left breast  . Chronic combined systolic and diastolic heart failure (HCC)    Echocardiogram 3/12: Mild LVH, EF 54-27%, normal diastolic function, mild AI, mild MR, PASP 44, normal wall motion  . CKD (chronic kidney disease)    sees Dr. Burman Foster - Stage 3 ?  Marland Kitchen Diabetes mellitus   . Hyperlipemia   . Hypertension   . MI, old    2009  . Osteoporosis   . Pacemaker   . Pleural effusion   . Pneumonia   . PUD (peptic ulcer disease) 10/2010   duodenal ulcer    ROS:   All systems reviewed and negative except as noted in the HPI.   Past Surgical History:  Procedure Laterality Date  . BREAST LUMPECTOMY WITH RADIOACTIVE SEED LOCALIZATION Left 09/19/2016   Procedure: LEFT BREAST  LUMPECTOMY WITH RADIOACTIVE SEED LOCALIZATION;  Surgeon: Autumn Messing III, MD;  Location: [facility];  Service: General;  Laterality: Left;  . CATARACT EXTRACTION Bilateral    01/12/09 and 01/26/09 both eyes  . COLONOSCOPY    . CORONARY ANGIOPLASTY  2009  . PACEMAKER INSERTION  07/13/07  . stent implant  07/13/2007     Family History  Problem Relation Age of Onset  . Heart disease Father   . Heart attack Father   . Clotting disorder Father        blood clot  . Hypertension Father   . Heart disease Mother   . Breast cancer Maternal Aunt 91  . Breast cancer Sister 67  . Breast cancer Other 40  . Colon cancer Neg Hx      Social History   Social History  . Marital status: Widowed    Spouse name: N/A  . Number of children: 1  . Years of education: N/A   Occupational History  . retired [employer] Retired   Social History Main Topics  . Smoking status: Former Smoker    Packs/day: 2.00    Years: 20.00    Types: Cigarettes    Quit date: 11/26/1991  . Smokeless tobacco: Never Used  . Alcohol use No  . Drug use: No  . Sexual activity: Not on file   Other Topics Concern  . Not on file   Social History Narrative   Reg exercise           BP 120/68   Pulse 88   Ht 5\' 3"  (1.6 m)   Wt 153 lb (69.4 kg)   SpO2 98%   BMI 27.10 kg/m   Physical Exam:  Well appearing 76 year old woman, NAD HEENT: Unremarkable Neck:  6 cm JVD, no thyromegally Back:  No CVA tenderness Lungs:  Clear with no wheezes, rales, or rhonchi. HEART:  Regular rate rhythm, no murmurs, no rubs, no clicks Abd:  soft, positive bowel sounds, no organomegally, no rebound, no guarding Ext:  2 plus pulses, no edema, no cyanosis, no clubbing Skin:  No rashes no nodules Neuro:  CN II through XII intact, motor grossly intact  ECG - atrial fib with ventricular pacing at 85/min  DEVICE  Normal device function.  See PaceArt for details.   Assess/Plan: 1. Atrial fib - she is doing well. No change in meds. She will  continue her systemic anti-coagulation. Her rate is controlled 2. Diastolic heart failure - her symptoms are class 2. She will continue her current meds. She is instructed to maintain a low sodium diet. 3. PPM - her Medtronic DDD PM is working normally. Will recheck  in several months.  Mikle Bosworth.D.

## 2016-11-28 NOTE — Addendum Note (Signed)
Addendum  created 11/28/16 1247 by Oleta Mouse, MD   Sign clinical note

## 2016-12-02 ENCOUNTER — Encounter: Payer: Self-pay | Admitting: Family Medicine

## 2016-12-03 ENCOUNTER — Other Ambulatory Visit: Payer: Self-pay | Admitting: Family Medicine

## 2016-12-08 ENCOUNTER — Other Ambulatory Visit: Payer: Self-pay | Admitting: Internal Medicine

## 2016-12-17 ENCOUNTER — Other Ambulatory Visit: Payer: Self-pay | Admitting: Family Medicine

## 2016-12-19 ENCOUNTER — Ambulatory Visit (INDEPENDENT_AMBULATORY_CARE_PROVIDER_SITE_OTHER): Payer: Medicare Other | Admitting: General Practice

## 2016-12-19 ENCOUNTER — Ambulatory Visit (INDEPENDENT_AMBULATORY_CARE_PROVIDER_SITE_OTHER): Payer: Self-pay | Admitting: *Deleted

## 2016-12-19 DIAGNOSIS — Z5181 Encounter for therapeutic drug level monitoring: Secondary | ICD-10-CM | POA: Diagnosis not present

## 2016-12-19 DIAGNOSIS — Z950 Presence of cardiac pacemaker: Secondary | ICD-10-CM

## 2016-12-19 LAB — POCT INR: INR: 2.2

## 2016-12-19 NOTE — Patient Instructions (Signed)
Pre visit review using our clinic review tool, if applicable. No additional management support is needed unless otherwise documented below in the visit note. 

## 2016-12-19 NOTE — Progress Notes (Signed)
I agree with this plan.

## 2016-12-19 NOTE — Progress Notes (Signed)
Remote pacemaker transmission.   

## 2016-12-20 ENCOUNTER — Ambulatory Visit: Payer: Medicare Other | Admitting: Oncology

## 2016-12-20 ENCOUNTER — Ambulatory Visit (HOSPITAL_BASED_OUTPATIENT_CLINIC_OR_DEPARTMENT_OTHER): Payer: Medicare Other | Admitting: Oncology

## 2016-12-20 VITALS — BP 123/66 | HR 94 | Temp 98.2°F | Resp 18 | Ht 63.0 in | Wt 148.3 lb

## 2016-12-20 DIAGNOSIS — M810 Age-related osteoporosis without current pathological fracture: Secondary | ICD-10-CM | POA: Diagnosis not present

## 2016-12-20 DIAGNOSIS — D0512 Intraductal carcinoma in situ of left breast: Secondary | ICD-10-CM

## 2016-12-20 NOTE — Progress Notes (Signed)
[facility]  [phone] [phone]     ID: ADREONA BRAND DOB: [birth year]  MR#: [id number]  WNI#:[id number]  Patient Care Team: Eulas Post, MD as PCP - General (Family Medicine) Donato Heinz, MD as Consulting Physician (Nephrology) Aleksis Jiggetts, Virgie Dad, MD as Consulting Physician (Oncology) Jovita Kussmaul, MD as Consulting Physician (General Surgery) Causey, Charlestine Massed, NP as Nurse Practitioner (Hematology and Oncology) Chauncey Cruel, MD OTHER MD:  CHIEF COMPLAINT: Ductal carcinoma in situ   CURRENT TREATMENT: Anastrozole, denosumab/Prolia   BREAST CANCER HISTORY: From the original intake note:  The patient had routine bilateral screening mammography with tomography at the [facility] 07/06/2016, showing new calcifications in the left breast. The right breast was unremarkable. Unilateral left diagnostic mammography 07/27/2016 found the breast density to be category B. In the upper inner quadrant of the left breast there was a 0.7 cm group of pleomorphic calcifications.  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  Her subsequent history is as detailed below  INTERVAL HISTORY: Oyindamola returns today for follow-up of her noninvasive breast cancer accompanied by her daughter Alleen Borne. Patriece has been on anastrozole now for 2 months. She is tolerating it well.    Hot flashes and vaginal dryness are not a major issue. She never developed the arthralgias or myalgias that many patients can experience on this medication. She obtains it at a good price.   The only side effect she reports is fatigued. She says this is not every day. For example this morning she brought in the garbage for herself and her neighbor, who has a foot problem. Other days she feels a little bit tired. She is not  currently exercising regularly although there are [facility] programs [facility].  She also tolerated her first denosumab/Xgeva dose 10/25/2016 without any side effects that she is aware of.  REVIEW OF SYSTEMS: A detailed review of systems today was otherwise stable  PAST MEDICAL HISTORY: Past Medical History:  Diagnosis Date  . Anemia   . Anemia   . Arthritis    gout  . Atrial fibrillation ([place])    prior Sotalol - d/c'd 2/2 increased creatinine; rate control strategy  . CAD (coronary artery disease)    s/p PCI in [place] in 1/09  . Cancer [facility])    left breast  . Chronic combined systolic and diastolic heart failure (HCC)    Echocardiogram 3/12: Mild LVH, EF 38-18%, normal diastolic function, mild AI, mild MR, PASP 44, normal wall motion  . CKD (chronic kidney disease)    sees Dr. Burman Foster - Stage 3 ?  Marland Kitchen Diabetes mellitus   . Hyperlipemia   . Hypertension   . MI, old    2009  . Osteoporosis   . Pacemaker   . Pleural effusion   . Pneumonia   . PUD (peptic ulcer disease) 10/2010   duodenal ulcer    PAST SURGICAL HISTORY: Past Surgical History:  Procedure Laterality Date  . BREAST LUMPECTOMY WITH RADIOACTIVE SEED LOCALIZATION Left 09/19/2016   Procedure: LEFT BREAST LUMPECTOMY WITH RADIOACTIVE SEED LOCALIZATION;  Surgeon: Autumn Messing III, MD;  Location: [facility];  Service: General;  Laterality: Left;  . CATARACT EXTRACTION Bilateral    01/12/09 and 01/26/09 both eyes  . COLONOSCOPY    . CORONARY ANGIOPLASTY  2009  . PACEMAKER INSERTION  07/13/07  .  stent implant  07/13/2007    FAMILY HISTORY Family History  Problem Relation Age of Onset  . Heart disease Father   . Heart attack Father   . Clotting disorder Father        blood clot  . Hypertension Father   . Heart disease Mother   . Breast cancer Maternal Aunt 35  . Breast cancer Sister 5  . Breast cancer Other 40  . Colon cancer Neg Hx   The patient's father died at age 59 from a  myocardial infarction. The patient's mother died from multiple problems including pleurisy at the age of 65. The patient has 3 brothers, 2 sisters. One sister was diagnosed with breast cancer in her late 21s. The other sister had a "vaginal cancer" (? Cervical cancer). A maternal aunt was diagnosed with breast cancer in her 48s and her daughter, the patient's niece was diagnosed with breast cancer in her 56s.  GYNECOLOGIC HISTORY:  No LMP recorded. Patient is postmenopausal. Menarche age 52, first live birth age 27, the patient is GX P1. She stopped having periods in 1992. She did not take hormone replacement. She took oral contraceptives briefly remotely with no complications.  SOCIAL HISTORY:  She is originally from [place]. 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 She is a widow. She lives with her [place] in a retirement community. Her daughter Kristie Cowman lives in [place]. She is a Runner, broadcasting/film/video. The patient has no grandchildren. She is not a church attender    ADVANCED DIRECTIVES: The patient's daughter Alleen Borne is her healthcare power of attorney. Kenney Houseman can be reached at [phone] or [phone]   HEALTH MAINTENANCE: Social History  Substance Use Topics  . Smoking status: Former Smoker    Packs/day: 2.00    Years: 20.00    Types: Cigarettes    Quit date: 11/26/1991  . Smokeless tobacco: Never Used  . Alcohol use No     Colonoscopy:January 2017/ lobe our  PAP:  Bone density: Remote   Allergies  Allergen Reactions  . Amlodipine Besylate Swelling    edema    Current Outpatient Prescriptions  Medication Sig Dispense Refill  . anastrozole (ARIMIDEX) 1 MG tablet Take 1 tablet (1 mg total) by mouth daily. 90 tablet 4  . atorvastatin (LIPITOR) 40 MG tablet TAKE 1 TABLET (40 MG TOTAL) BY MOUTH DAILY. (Patient taking differently: Take 40 mg by mouth at bedtime. ) 90 tablet 2  . calcitRIOL (ROCALTROL) 0.25 MCG capsule Take 0.25 mcg by mouth every Monday, Wednesday, and  Friday. In the morning.    . colchicine 0.6 MG tablet TAKE 1 TABLET (0.6 MG TOTAL) BY MOUTH AS NEEDED AS DIRECTED. 30 tablet 4  . Cranberry 500 MG CAPS Take 500 mg by mouth daily with lunch.    . furosemide (LASIX) 40 MG tablet TAKE TWO TABLETS BY MOUTH EVERY MORNING AND TAKE ONE TABLET BY MOUTH EVERY EVENING 270 tablet 0  . glucose blood test strip 1 each by Other route 3 (three) times daily. Use as instructed     . HYDROcodone-acetaminophen (NORCO/VICODIN) 5-325 MG tablet Take 1-2 tablets by mouth every 4 (four) hours as needed for moderate pain or severe pain. 15 tablet 0  . insulin detemir (LEVEMIR) 100 UNIT/ML injection Inject 12 Units into the skin at bedtime. And on sliding scale    . LEVEMIR FLEXTOUCH 100 UNIT/ML Pen INJECT 5 UNITS INTO THE SKIN AT BEDTIME AS DIRECTED. (Patient taking differently: INJECT 12 UNITS INTO  THE SKIN AT BEDTIME) 15 mL 3  . metolazone (ZAROXOLYN) 2.5 MG tablet TAKE 1 TABLET ON M, W, FRI. ONLY; TAKE THIS WITH YOUR LASIX; YOU WILL ALSO NEED TO TAKE AN EXTRA POTASSIUM ON THESE DAYS AS WELL (Patient taking differently: Take 2.5 mg by mouth every Monday, Wednesday, and Friday. In the morning) 27 tablet 4  . metoprolol succinate (TOPROL-XL) 25 MG 24 hr tablet TAKE 2 TABLETS BY MOUTH EVERY MORNING AND THEN ONE TABLET BY MOUTH EVERY EVENING 90 tablet 5  . Multiple Vitamin (MULTIVITAMIN WITH MINERALS) TABS tablet Take 1 tablet by mouth daily. FOR ADULT 50+    . Omega-3 Fatty Acids (FISH OIL) 1000 MG CAPS Take 1,000 mg by mouth 2 (two) times daily.    . pantoprazole (PROTONIX) 40 MG tablet TAKE 1 TABLET (40 MG TOTAL) BY MOUTH DAILY. 90 tablet 1  . potassium chloride SA (K-DUR,KLOR-CON) 20 MEQ tablet TAKE TWO TABLETS BY MOUTH TWICE A DAY WITH FOOD AND WATER AS DIRECTED. 120 tablet 11  . warfarin (COUMADIN) 5 MG tablet TAKE AS DIRECTED BY ANTICOAGULATION CLINIC 30 tablet 3   No current facility-administered medications for this visit.     OBJECTIVE: Elderly white woman In no  acute distress  Vitals:   12/20/16 0901  BP: 123/66  Pulse: 94  Resp: 18  Temp: 98.2 F (36.8 C)     Body mass index is 26.27 kg/m.    ECOG FS:0 - Asymptomatic  Sclerae unicteric, EOMs intact Oropharynx clear and moist No cervical or supraclavicular adenopathy Lungs no rales or rhonchi Heart regular rate and rhythm Abd soft, nontender, positive bowel sounds MSK no focal spinal tenderness, no upper extremity lymphedema Neuro: nonfocal, well oriented, appropriate affect Breasts: The right breast is benign. The left breast is status post lumpectomy. The incision has healed nicely. There is no evidence of residual or recurrent disease. Both axillae are benign.   LAB RESULTS:  CMP     Component Value Date/Time   NA 137 10/25/2016 0941   K 4.3 10/25/2016 0941   CL 99 (L) 09/14/2016 1404   CO2 25 10/25/2016 0941   GLUCOSE 124 10/25/2016 0941   BUN 56.2 (H) 10/25/2016 0941   CREATININE 2.7 (H) 10/25/2016 0941   CALCIUM 10.3 10/25/2016 0941   PROT 7.9 10/25/2016 0941   ALBUMIN 3.6 10/25/2016 0941   AST 23 10/25/2016 0941   ALT 15 10/25/2016 0941   ALKPHOS 58 10/25/2016 0941   BILITOT 0.51 10/25/2016 0941   GFRNONAA 17 (L) 09/14/2016 1404   GFRAA 20 (L) 09/14/2016 1404    INo results found for: SPEP, UPEP  Lab Results  Component Value Date   WBC 8.1 10/25/2016   NEUTROABS 5.8 10/25/2016   HGB 12.7 10/25/2016   HCT 38.3 10/25/2016   MCV 86.7 10/25/2016   PLT 198 10/25/2016      Chemistry      Component Value Date/Time   NA 137 10/25/2016 0941   K 4.3 10/25/2016 0941   CL 99 (L) 09/14/2016 1404   CO2 25 10/25/2016 0941   BUN 56.2 (H) 10/25/2016 0941   CREATININE 2.7 (H) 10/25/2016 0941   GLU 103 10/05/2016      Component Value Date/Time   CALCIUM 10.3 10/25/2016 0941   ALKPHOS 58 10/25/2016 0941   AST 23 10/25/2016 0941   ALT 15 10/25/2016 0941   BILITOT 0.51 10/25/2016 0941       No results found for: LABCA2  No components found for:  ZOXWR604  Recent Labs Lab 12/19/16  INR 2.2    Urinalysis    Component Value Date/Time   COLORURINE LT. YELLOW 07/01/2011 [place] 07/01/2011 1203   LABSPEC 1.010 07/01/2011 1203   PHURINE 6.0 07/01/2011 1203   GLUCOSEU NEGATIVE 07/01/2011 1203   HGBUR TRACE-LYSED 07/01/2011 1203   BILIRUBINUR neg 05/02/2013 1215   KETONESUR NEGATIVE 07/01/2011 1203   PROTEINUR 1 05/02/2013 1215   PROTEINUR NEGATIVE 09/15/2010 0449   UROBILINOGEN 0.2 05/02/2013 1215   UROBILINOGEN 0.2 07/01/2011 1203   NITRITE neg 05/02/2013 1215   NITRITE NEGATIVE 07/01/2011 1203   LEUKOCYTESUR moderate (2+) 05/02/2013 1215     STUDIES: No results found.  ELIGIBLE FOR AVAILABLE RESEARCH PROTOCOL: COMET/ discussed 08/23/2016; patient declines participation  ASSESSMENT: 76 y.o. [place] woman status post left breast upper inner quadrant biopsy 07/29/2016 for ductal carcinoma in situ, grade 2, estrogen and progesterone receptor positive  (1) Genetics testing 08/23/2016 through the Multi-Gene Panel offered by [facility] found no deleterious mutations in ALK, APC, ATM, AXIN2,BAP1,  BARD1, BLM, BMPR1A, BRCA1, BRCA2, BRIP1, CASR, CDC73, CDH1, CDK4, CDKN1B, CDKN1C, CDKN2A (p14ARF), CDKN2A (p16INK4a), CEBPA, CHEK2, DICER1, CIS3L2, EGFR (c.2369C>T, p.Thr790Met variant only), EPCAM (Deletion/duplication testing only), FH, FLCN, GATA2, GPC3, GREM1 (Promoter region deletion/duplication testing only), HOXB13 (c.251G>A, p.Gly84Glu), HRAS, KIT, MAX, MEN1, MET, MITF (c.952G>A, p.Glu318Lys variant only), MLH1, MSH2, MSH6, MUTYH, NBN, NF1, NF2, PALB2, PDGFRA, PHOX2B, PMS2, POLD1, POLE, POT1, PRKAR1A, PTCH1, PTEN, RAD50, RAD51C, RAD51D, RB1, RECQL4, RET, RUNX1, SDHAF2, SDHA (sequence changes only), SDHB, SDHC, SDHD, SMAD4, SMARCA4, SMARCB1, SMARCE1, STK11, SUFU, TERT, TERT, TMEM127, TP53, TSC1, TSC2, VHL, WRN and WT1  (2) considered the COMET trial: patient declines  (3) left lumpectomy 09/19/2016 found ductal  carcinoma in situ, grade 2, measuring 0.6 cm, with negative margins.  (4) advised against adjuvant radiation since there would be no survival advantage in this setting and the patient's pacemaker would have to be moved  (5) anastrozole started 09/27/2016  (a) bone density 08/25/2015 found a T score of -2.7, consistent with osteoporosis   (b) denosumab/[place] started 10/25/2016, repeated every 24 week  PLAN: Whittley is tolerating the anastrozole well and the plan will be to continue that for a total of 5 years.  The main concern with this drug of course is bone density loss. She was started on denosumab for this and is tolerating it well. The plan will be to continue that every 6 months until she completes her 5 years of anastrozole.  She will have her next denosumab dose in October. She will have her next mammogram in January. She will see me again in February and from that point I will start seeing her on a once a year basis  She knows to call for any problems that may develop before the next visit here.   Chauncey Cruel, MD   12/20/2016 9:18 AM Medical Oncology and Hematology [facility] [address] [place], [place] [address] Tel. [phone]    Fax. [phone]

## 2016-12-21 ENCOUNTER — Encounter: Payer: Self-pay | Admitting: Cardiology

## 2016-12-21 LAB — CUP PACEART REMOTE DEVICE CHECK
Battery Voltage: 2.71 V
Brady Statistic RV Percent Paced: 100 %
Date Time Interrogation Session: 20180623172636
Implantable Lead Implant Date: 20090116
Implantable Lead Location: 753860
Implantable Lead Model: 5076
Lead Channel Impedance Value: 504 Ohm
Lead Channel Impedance Value: 67 Ohm
Lead Channel Pacing Threshold Amplitude: 1.25 V
Lead Channel Setting Pacing Amplitude: 2.5 V
Lead Channel Setting Pacing Pulse Width: 0.4 ms
MDC IDC LEAD IMPLANT DT: 20090116
MDC IDC LEAD LOCATION: 753859
MDC IDC MSMT BATTERY IMPEDANCE: 3478 Ohm
MDC IDC MSMT BATTERY REMAINING LONGEVITY: 17 mo
MDC IDC MSMT LEADCHNL RV PACING THRESHOLD PULSEWIDTH: 0.4 ms
MDC IDC PG IMPLANT DT: 20090116
MDC IDC SET LEADCHNL RV SENSING SENSITIVITY: 2 mV

## 2017-01-04 ENCOUNTER — Encounter: Payer: Self-pay | Admitting: Cardiology

## 2017-01-19 ENCOUNTER — Ambulatory Visit (INDEPENDENT_AMBULATORY_CARE_PROVIDER_SITE_OTHER): Payer: Medicare Other | Admitting: *Deleted

## 2017-01-19 ENCOUNTER — Telehealth: Payer: Self-pay | Admitting: Cardiology

## 2017-01-19 DIAGNOSIS — I482 Chronic atrial fibrillation, unspecified: Secondary | ICD-10-CM

## 2017-01-19 NOTE — Telephone Encounter (Signed)
Spoke w/ pt informed her that we did receive her remote transmission. Pt verbalized understanding.

## 2017-01-20 ENCOUNTER — Encounter: Payer: Self-pay | Admitting: Cardiology

## 2017-01-20 NOTE — Progress Notes (Signed)
Remote pacemaker transmission.   

## 2017-01-30 ENCOUNTER — Ambulatory Visit (INDEPENDENT_AMBULATORY_CARE_PROVIDER_SITE_OTHER): Payer: Medicare Other | Admitting: General Practice

## 2017-01-30 DIAGNOSIS — Z5181 Encounter for therapeutic drug level monitoring: Secondary | ICD-10-CM

## 2017-01-30 LAB — POCT INR: INR: 1.9

## 2017-01-30 NOTE — Patient Instructions (Signed)
Pre visit review using our clinic review tool, if applicable. No additional management support is needed unless otherwise documented below in the visit note. 

## 2017-02-01 ENCOUNTER — Other Ambulatory Visit: Payer: Self-pay | Admitting: Family Medicine

## 2017-02-02 ENCOUNTER — Other Ambulatory Visit: Payer: Self-pay | Admitting: General Practice

## 2017-02-02 MED ORDER — WARFARIN SODIUM 5 MG PO TABS: ORAL_TABLET | ORAL | 3 refills | Status: DC

## 2017-02-03 ENCOUNTER — Other Ambulatory Visit: Payer: Self-pay | Admitting: Family Medicine

## 2017-02-03 ENCOUNTER — Encounter: Payer: Self-pay | Admitting: Family Medicine

## 2017-02-03 ENCOUNTER — Ambulatory Visit (INDEPENDENT_AMBULATORY_CARE_PROVIDER_SITE_OTHER): Payer: Medicare Other | Admitting: Family Medicine

## 2017-02-03 VITALS — BP 102/70 | HR 80 | Temp 98.1°F | Wt 151.2 lb

## 2017-02-03 DIAGNOSIS — L989 Disorder of the skin and subcutaneous tissue, unspecified: Secondary | ICD-10-CM | POA: Diagnosis not present

## 2017-02-03 DIAGNOSIS — N183 Chronic kidney disease, stage 3 (moderate): Secondary | ICD-10-CM | POA: Diagnosis not present

## 2017-02-03 DIAGNOSIS — E1122 Type 2 diabetes mellitus with diabetic chronic kidney disease: Secondary | ICD-10-CM

## 2017-02-03 DIAGNOSIS — I10 Essential (primary) hypertension: Secondary | ICD-10-CM

## 2017-02-03 DIAGNOSIS — Z794 Long term (current) use of insulin: Secondary | ICD-10-CM

## 2017-02-03 DIAGNOSIS — E785 Hyperlipidemia, unspecified: Secondary | ICD-10-CM

## 2017-02-03 DIAGNOSIS — E1165 Type 2 diabetes mellitus with hyperglycemia: Secondary | ICD-10-CM | POA: Diagnosis not present

## 2017-02-03 DIAGNOSIS — IMO0002 Reserved for concepts with insufficient information to code with codable children: Secondary | ICD-10-CM

## 2017-02-03 LAB — POCT GLYCOSYLATED HEMOGLOBIN (HGB A1C): Hemoglobin A1C: 6.6

## 2017-02-03 NOTE — Progress Notes (Signed)
Subjective:     Patient ID: Yolanda Huffman, female   DOB: 1941/06/16, 76 y.o.   MRN: [id number]  HPI Patient seen for medical follow-up. She has multiple chronic problems including history of CAD, atrial fibrillation, hypertension, heart failure, breast cancer, type 2 diabetes on insulin, osteoporosis, chronic kidney disease. She is followed closely by oncology, nephrology, and cardiology.  No recent chest pains. Generally feels well. She had lipids done in May which were stable. Last A1c 6.5%. Denies recent hypoglycemia.  Renal function has been stable. Compliant with all medications  Skin lesion dorsum right hand which she first noted about 3 weeks ago. No injury. Denies prior history of skin cancer.  Past Medical History:  Diagnosis Date  . Anemia   . Anemia   . Arthritis    gout  . Atrial fibrillation ([place])    prior Sotalol - d/c'd 2/2 increased creatinine; rate control strategy  . CAD (coronary artery disease)    s/p PCI in [place] in 1/09  . Cancer [facility])    left breast  . Chronic combined systolic and diastolic heart failure (HCC)    Echocardiogram 3/12: Mild LVH, EF 42-35%, normal diastolic function, mild AI, mild MR, PASP 44, normal wall motion  . CKD (chronic kidney disease)    sees Dr. Burman Foster - Stage 3 ?  Marland Kitchen Diabetes mellitus   . Hyperlipemia   . Hypertension   . MI, old    2009  . Osteoporosis   . Pacemaker   . Pleural effusion   . Pneumonia   . PUD (peptic ulcer disease) 10/2010   duodenal ulcer   Past Surgical History:  Procedure Laterality Date  . BREAST LUMPECTOMY WITH RADIOACTIVE SEED LOCALIZATION Left 09/19/2016   Procedure: LEFT BREAST LUMPECTOMY WITH RADIOACTIVE SEED LOCALIZATION;  Surgeon: Autumn Messing III, MD;  Location: [facility];  Service: General;  Laterality: Left;  . CATARACT EXTRACTION Bilateral    01/12/09 and 01/26/09 both eyes  . COLONOSCOPY    . CORONARY ANGIOPLASTY  2009  . PACEMAKER INSERTION  07/13/07  . stent implant  07/13/2007    reports that she  quit smoking about 25 years ago. Her smoking use included Cigarettes. She has a 40.00 pack-year smoking history. She has never used smokeless tobacco. She reports that she does not drink alcohol or use drugs. family history includes Breast cancer (age of onset: 72) in her other; Breast cancer (age of onset: 9) in her maternal aunt; Breast cancer (age of onset: 33) in her sister; Clotting disorder in her father; Heart attack in her father; Heart disease in her father and mother; Hypertension in her father. Allergies  Allergen Reactions  . Amlodipine Besylate Swelling    edema     Review of Systems  Constitutional: Negative for fatigue.  Eyes: Negative for visual disturbance.  Respiratory: Negative for cough, chest tightness, shortness of breath and wheezing.   Cardiovascular: Negative for chest pain, palpitations and leg swelling.  Neurological: Negative for dizziness, seizures, syncope, weakness, light-headedness and headaches.  Psychiatric/Behavioral: Negative for confusion.       Objective:   Physical Exam  Constitutional: She appears well-developed and well-nourished.  Eyes: Pupils are equal, round, and reactive to light.  Neck: Neck supple. No JVD present. No thyromegaly present.  Cardiovascular: Normal rate and regular rhythm.  Exam reveals no gallop.   Pulmonary/Chest: Effort normal and breath sounds normal. No respiratory distress. She has no wheezes. She has no rales.  Musculoskeletal: She exhibits no edema.  Neurological:  She is alert.  Skin:  Patient has approximately 8 mm hyperkeratotic skin lesion dorsum right hand was slightly elevated border.       Assessment:     #1 hypertension stable and at goal  #2 type 2 diabetes with good control  - A1c today 6.6%  #3 dyslipidemia. Her recent lipids were stable  #4 skin lesion dorsum right hand. Concern for possible early squamous cell carcinoma    Plan:     -Routine medical follow-up in 4 months -Continue current  medication regimen -Reminder for flu vaccine this fall -Touch base if right hand lesion not resolved in 6 weeks. If not will need referral for probable biopsy  Eulas Post MD [facility]

## 2017-02-03 NOTE — Telephone Encounter (Signed)
Rx already sent in yesterday for #30 with 3 refills.

## 2017-02-03 NOTE — Patient Instructions (Signed)
Let me know if right hand lesion not resolving in 4-6 weeks. Your A1C was 6.6% which is stable.

## 2017-02-07 ENCOUNTER — Other Ambulatory Visit: Payer: Self-pay | Admitting: Family Medicine

## 2017-02-08 ENCOUNTER — Other Ambulatory Visit: Payer: Self-pay | Admitting: Family Medicine

## 2017-02-17 LAB — CUP PACEART REMOTE DEVICE CHECK
Battery Impedance: 3524 Ohm
Battery Remaining Longevity: 17 mo
Brady Statistic RV Percent Paced: 100 %
Implantable Lead Implant Date: 20090116
Implantable Lead Implant Date: 20090116
Implantable Lead Location: 753860
Implantable Lead Model: 5076
Implantable Lead Model: 5076
Lead Channel Impedance Value: 491 Ohm
Lead Channel Impedance Value: 67 Ohm
Lead Channel Pacing Threshold Pulse Width: 0.4 ms
Lead Channel Setting Pacing Pulse Width: 0.4 ms
Lead Channel Setting Sensing Sensitivity: 2 mV
MDC IDC LEAD LOCATION: 753859
MDC IDC MSMT BATTERY VOLTAGE: 2.72 V
MDC IDC MSMT LEADCHNL RV PACING THRESHOLD AMPLITUDE: 1.25 V
MDC IDC PG IMPLANT DT: 20090116
MDC IDC SESS DTM: 20180723132530
MDC IDC SET LEADCHNL RV PACING AMPLITUDE: 2.5 V

## 2017-02-20 ENCOUNTER — Ambulatory Visit (INDEPENDENT_AMBULATORY_CARE_PROVIDER_SITE_OTHER): Payer: Medicare Other | Admitting: *Deleted

## 2017-02-20 ENCOUNTER — Telehealth: Payer: Self-pay | Admitting: *Deleted

## 2017-02-20 DIAGNOSIS — I482 Chronic atrial fibrillation, unspecified: Secondary | ICD-10-CM

## 2017-02-20 MED ORDER — METOLAZONE 2.5 MG PO TABS: 2.5000 mg | ORAL_TABLET | ORAL | 1 refills | Status: DC

## 2017-02-20 NOTE — Telephone Encounter (Signed)
Refill for 6 months. 

## 2017-02-20 NOTE — Progress Notes (Signed)
Remote pacemaker transmission.   

## 2017-02-20 NOTE — Telephone Encounter (Signed)
metolazone (ZAROXOLYN) 2.5 MG tablet [facility] [address] Okay to refill?

## 2017-02-21 LAB — CUP PACEART REMOTE DEVICE CHECK
Battery Remaining Longevity: 15 mo
Date Time Interrogation Session: 20180827151913
Implantable Lead Implant Date: 20090116
Implantable Lead Location: 753860
Lead Channel Impedance Value: 480 Ohm
Lead Channel Impedance Value: 67 Ohm
Lead Channel Pacing Threshold Amplitude: 1.125 V
Lead Channel Pacing Threshold Pulse Width: 0.4 ms
Lead Channel Setting Pacing Amplitude: 2.75 V
MDC IDC LEAD IMPLANT DT: 20090116
MDC IDC LEAD LOCATION: 753859
MDC IDC MSMT BATTERY IMPEDANCE: 3518 Ohm
MDC IDC MSMT BATTERY VOLTAGE: 2.7 V
MDC IDC PG IMPLANT DT: 20090116
MDC IDC SET LEADCHNL RV PACING PULSEWIDTH: 0.4 ms
MDC IDC SET LEADCHNL RV SENSING SENSITIVITY: 2 mV
MDC IDC STAT BRADY RV PERCENT PACED: 100 %

## 2017-03-01 ENCOUNTER — Telehealth: Payer: Self-pay | Admitting: Family Medicine

## 2017-03-01 NOTE — Telephone Encounter (Signed)
Patient is no longer taking Zaroxolyn.  Medication list updated.  Patient will call cardiologist to verify.

## 2017-03-01 NOTE — Telephone Encounter (Signed)
° ° °  Pt would like a call back concerinh why she was put on the below med  metolazone (ZAROXOLYN) 2.5 MG tablet

## 2017-03-01 NOTE — Telephone Encounter (Signed)
We did not recently put her on this but this came as a refill request through pharmacy. It appears that she was put on this back in 2013 by refill records. I'm not sure if cardiology started this initially. Has she not been taking this? This medication was on her list.  Parke Simmers is a diuretic medication frequently used as an adjunct in patients with peripheral edema would not controlled with Lasix.

## 2017-03-02 ENCOUNTER — Telehealth: Payer: Self-pay | Admitting: Internal Medicine

## 2017-03-02 DIAGNOSIS — I5042 Chronic combined systolic (congestive) and diastolic (congestive) heart failure: Secondary | ICD-10-CM

## 2017-03-02 MED ORDER — METOLAZONE 2.5 MG PO TABS: 2.5000 mg | ORAL_TABLET | ORAL | 3 refills | Status: DC

## 2017-03-02 NOTE — Telephone Encounter (Signed)
Spoke with pharmacist at [facility].  There was a miscommunication between pharmacist and patient.  There is no new drug, patient just needs her metolazone refilled.  Clarified with pharmacist.  Order reentered.  Pt notified.  Pt thanked this nurse for assistance.

## 2017-03-02 NOTE — Telephone Encounter (Signed)
New Message:   Please call,she received a call about a new medicine from the pharmacist. Pt said she knew nothing about this medicine.The pharmacist said her Cardiologist's office had called it in.

## 2017-03-02 NOTE — Telephone Encounter (Signed)
Pt has a call into dr taylor cardiologist to confirm she still suppose to take zaroxolyn

## 2017-03-03 ENCOUNTER — Encounter: Payer: Self-pay | Admitting: Cardiology

## 2017-03-06 ENCOUNTER — Ambulatory Visit (INDEPENDENT_AMBULATORY_CARE_PROVIDER_SITE_OTHER): Payer: Medicare Other | Admitting: General Practice

## 2017-03-06 DIAGNOSIS — Z7901 Long term (current) use of anticoagulants: Secondary | ICD-10-CM

## 2017-03-06 DIAGNOSIS — Z5181 Encounter for therapeutic drug level monitoring: Secondary | ICD-10-CM

## 2017-03-06 LAB — POCT INR: INR: 1.7

## 2017-03-06 NOTE — Patient Instructions (Signed)
Pre visit review using our clinic review tool, if applicable. No additional management support is needed unless otherwise documented below in the visit note. 

## 2017-03-08 DIAGNOSIS — Z7901 Long term (current) use of anticoagulants: Secondary | ICD-10-CM | POA: Insufficient documentation

## 2017-03-16 ENCOUNTER — Encounter: Payer: Self-pay | Admitting: Family Medicine

## 2017-03-23 ENCOUNTER — Telehealth: Payer: Self-pay | Admitting: Cardiology

## 2017-03-23 ENCOUNTER — Other Ambulatory Visit: Payer: Self-pay | Admitting: Family Medicine

## 2017-03-23 ENCOUNTER — Ambulatory Visit (INDEPENDENT_AMBULATORY_CARE_PROVIDER_SITE_OTHER): Payer: Medicare Other | Admitting: *Deleted

## 2017-03-23 DIAGNOSIS — Z950 Presence of cardiac pacemaker: Secondary | ICD-10-CM

## 2017-03-23 NOTE — Telephone Encounter (Signed)
Spoke with pt and reminded pt of remote transmission that is due today. Pt verbalized understanding.   

## 2017-03-24 NOTE — Progress Notes (Signed)
Remote pacemaker transmission.   

## 2017-03-28 ENCOUNTER — Encounter: Payer: Self-pay | Admitting: Cardiology

## 2017-04-03 ENCOUNTER — Ambulatory Visit: Payer: Medicare Other

## 2017-04-04 LAB — CUP PACEART REMOTE DEVICE CHECK
Battery Voltage: 2.71 V
Brady Statistic RV Percent Paced: 100 %
Date Time Interrogation Session: 20180927175927
Implantable Lead Location: 753859
Implantable Lead Location: 753860
Lead Channel Impedance Value: 478 Ohm
Lead Channel Pacing Threshold Amplitude: 1.375 V
Lead Channel Setting Pacing Amplitude: 2.75 V
Lead Channel Setting Pacing Pulse Width: 0.4 ms
MDC IDC LEAD IMPLANT DT: 20090116
MDC IDC LEAD IMPLANT DT: 20090116
MDC IDC MSMT BATTERY IMPEDANCE: 3630 Ohm
MDC IDC MSMT BATTERY REMAINING LONGEVITY: 14 mo
MDC IDC MSMT LEADCHNL RA IMPEDANCE VALUE: 67 Ohm
MDC IDC MSMT LEADCHNL RV PACING THRESHOLD PULSEWIDTH: 0.4 ms
MDC IDC PG IMPLANT DT: 20090116
MDC IDC SET LEADCHNL RV SENSING SENSITIVITY: 2 mV

## 2017-04-10 ENCOUNTER — Ambulatory Visit (INDEPENDENT_AMBULATORY_CARE_PROVIDER_SITE_OTHER): Payer: Medicare Other | Admitting: General Practice

## 2017-04-10 DIAGNOSIS — Z7901 Long term (current) use of anticoagulants: Secondary | ICD-10-CM | POA: Diagnosis not present

## 2017-04-10 LAB — POCT INR: INR: 2.1

## 2017-04-10 NOTE — Patient Instructions (Signed)
Pre visit review using our clinic review tool, if applicable. No additional management support is needed unless otherwise documented below in the visit note. 

## 2017-04-11 ENCOUNTER — Ambulatory Visit: Payer: Medicare Other

## 2017-04-11 ENCOUNTER — Other Ambulatory Visit: Payer: Medicare Other

## 2017-04-14 ENCOUNTER — Encounter: Payer: Self-pay | Admitting: Cardiology

## 2017-04-18 ENCOUNTER — Ambulatory Visit (HOSPITAL_BASED_OUTPATIENT_CLINIC_OR_DEPARTMENT_OTHER): Payer: Medicare Other

## 2017-04-18 ENCOUNTER — Other Ambulatory Visit (HOSPITAL_BASED_OUTPATIENT_CLINIC_OR_DEPARTMENT_OTHER): Payer: Medicare Other

## 2017-04-18 VITALS — BP 131/62 | HR 85 | Temp 97.9°F | Resp 20

## 2017-04-18 DIAGNOSIS — I5043 Acute on chronic combined systolic (congestive) and diastolic (congestive) heart failure: Secondary | ICD-10-CM

## 2017-04-18 DIAGNOSIS — D0512 Intraductal carcinoma in situ of left breast: Secondary | ICD-10-CM | POA: Diagnosis not present

## 2017-04-18 DIAGNOSIS — M810 Age-related osteoporosis without current pathological fracture: Secondary | ICD-10-CM

## 2017-04-18 LAB — COMPREHENSIVE METABOLIC PANEL
ALBUMIN: 3.4 g/dL — AB (ref 3.5–5.0)
ALK PHOS: 42 U/L (ref 40–150)
ALT: 18 U/L (ref 0–55)
AST: 28 U/L (ref 5–34)
Anion Gap: 12 mEq/L — ABNORMAL HIGH (ref 3–11)
BILIRUBIN TOTAL: 0.55 mg/dL (ref 0.20–1.20)
BUN: 53.9 mg/dL — AB (ref 7.0–26.0)
CALCIUM: 9.1 mg/dL (ref 8.4–10.4)
CO2: 26 mEq/L (ref 22–29)
Chloride: 96 mEq/L — ABNORMAL LOW (ref 98–109)
Creatinine: 2.4 mg/dL — ABNORMAL HIGH (ref 0.6–1.1)
EGFR: 18 mL/min/{1.73_m2} — AB (ref >60)
GLUCOSE: 139 mg/dL (ref 70–140)
POTASSIUM: 3.4 meq/L — AB (ref 3.5–5.1)
SODIUM: 134 meq/L — AB (ref 136–145)
TOTAL PROTEIN: 7.4 g/dL (ref 6.4–8.3)

## 2017-04-18 LAB — CBC WITH DIFFERENTIAL/PLATELET
BASO%: 1 % (ref 0.0–2.0)
BASOS ABS: 0.1 10*3/uL (ref 0.0–0.1)
EOS ABS: 0.1 10*3/uL (ref 0.0–0.5)
EOS%: 1.5 % (ref 0.0–7.0)
HEMATOCRIT: 36.9 % (ref 34.8–46.6)
HEMOGLOBIN: 12.4 g/dL (ref 11.6–15.9)
LYMPH#: 1.3 10*3/uL (ref 0.9–3.3)
LYMPH%: 17.7 % (ref 14.0–49.7)
MCH: 29.9 pg (ref 25.1–34.0)
MCHC: 33.7 g/dL (ref 31.5–36.0)
MCV: 88.6 fL (ref 79.5–101.0)
MONO#: 0.6 10*3/uL (ref 0.1–0.9)
MONO%: 8 % (ref 0.0–14.0)
NEUT%: 71.8 % (ref 38.4–76.8)
NEUTROS ABS: 5.3 10*3/uL (ref 1.5–6.5)
Platelets: 181 10*3/uL (ref 145–400)
RBC: 4.16 10*6/uL (ref 3.70–5.45)
RDW: 15 % — AB (ref 11.2–14.5)
WBC: 7.3 10*3/uL (ref 3.9–10.3)

## 2017-04-18 MED ORDER — DENOSUMAB 60 MG/ML ~~LOC~~ SOLN
60.0000 mg | Freq: Once | SUBCUTANEOUS | Status: AC
  Administered 2017-04-18: 60 mg via SUBCUTANEOUS
  Filled 2017-04-18: qty 1

## 2017-04-18 NOTE — Patient Instructions (Signed)

## 2017-04-20 ENCOUNTER — Other Ambulatory Visit: Payer: Self-pay | Admitting: Family Medicine

## 2017-04-24 ENCOUNTER — Ambulatory Visit (INDEPENDENT_AMBULATORY_CARE_PROVIDER_SITE_OTHER): Payer: Medicare Other | Admitting: *Deleted

## 2017-04-24 DIAGNOSIS — Z950 Presence of cardiac pacemaker: Secondary | ICD-10-CM

## 2017-04-25 LAB — CUP PACEART REMOTE DEVICE CHECK
Battery Remaining Longevity: 12 mo
Battery Voltage: 2.71 V
Brady Statistic RV Percent Paced: 100 %
Date Time Interrogation Session: 20181029151231
Implantable Lead Implant Date: 20090116
Implantable Lead Location: 753860
Implantable Lead Model: 5076
Lead Channel Impedance Value: 485 Ohm
Lead Channel Impedance Value: 67 Ohm
Lead Channel Setting Pacing Amplitude: 2.75 V
Lead Channel Setting Pacing Pulse Width: 0.4 ms
MDC IDC LEAD IMPLANT DT: 20090116
MDC IDC LEAD LOCATION: 753859
MDC IDC MSMT BATTERY IMPEDANCE: 3923 Ohm
MDC IDC MSMT LEADCHNL RV PACING THRESHOLD AMPLITUDE: 1.375 V
MDC IDC MSMT LEADCHNL RV PACING THRESHOLD PULSEWIDTH: 0.4 ms
MDC IDC PG IMPLANT DT: 20090116
MDC IDC SET LEADCHNL RV SENSING SENSITIVITY: 2 mV

## 2017-04-25 NOTE — Progress Notes (Signed)
Remote pacemaker transmission.   

## 2017-05-02 ENCOUNTER — Encounter: Payer: Self-pay | Admitting: Cardiology

## 2017-05-08 ENCOUNTER — Ambulatory Visit: Payer: Medicare Other

## 2017-05-15 ENCOUNTER — Ambulatory Visit: Payer: Medicare Other | Admitting: General Practice

## 2017-05-15 DIAGNOSIS — Z7901 Long term (current) use of anticoagulants: Secondary | ICD-10-CM | POA: Diagnosis not present

## 2017-05-15 LAB — POCT INR: INR: 2.5

## 2017-05-15 NOTE — Patient Instructions (Addendum)
Pre visit review using our clinic review tool, if applicable. No additional management support is needed unless otherwise documented below in the visit note.  Continue to take 1/2 tablet on Monday, Wednesdays and Fridays and 1 tablet all other days.  Re-check in 4 week.

## 2017-05-26 ENCOUNTER — Ambulatory Visit (INDEPENDENT_AMBULATORY_CARE_PROVIDER_SITE_OTHER): Payer: Medicare Other | Admitting: *Deleted

## 2017-05-26 DIAGNOSIS — I482 Chronic atrial fibrillation, unspecified: Secondary | ICD-10-CM

## 2017-05-26 NOTE — Progress Notes (Signed)
Remote pacemaker transmission.   

## 2017-05-29 ENCOUNTER — Encounter: Payer: Self-pay | Admitting: Cardiology

## 2017-05-29 NOTE — Progress Notes (Signed)
Letter  

## 2017-06-02 ENCOUNTER — Other Ambulatory Visit: Payer: Self-pay | Admitting: Family Medicine

## 2017-06-05 ENCOUNTER — Ambulatory Visit: Payer: Medicare Other | Admitting: Family Medicine

## 2017-06-05 ENCOUNTER — Ambulatory Visit: Payer: Medicare Other

## 2017-06-05 LAB — CUP PACEART REMOTE DEVICE CHECK
Battery Remaining Longevity: 12 mo
Battery Voltage: 2.71 V
Date Time Interrogation Session: 20181130111313
Implantable Lead Implant Date: 20090116
Implantable Lead Location: 753859
Implantable Lead Location: 753860
Implantable Lead Model: 5076
Implantable Pulse Generator Implant Date: 20090116
Lead Channel Impedance Value: 495 Ohm
Lead Channel Pacing Threshold Amplitude: 1 V
Lead Channel Pacing Threshold Pulse Width: 0.4 ms
MDC IDC LEAD IMPLANT DT: 20090116
MDC IDC MSMT BATTERY IMPEDANCE: 3977 Ohm
MDC IDC MSMT LEADCHNL RA IMPEDANCE VALUE: 67 Ohm
MDC IDC SET LEADCHNL RV PACING AMPLITUDE: 2.75 V
MDC IDC SET LEADCHNL RV PACING PULSEWIDTH: 0.4 ms
MDC IDC SET LEADCHNL RV SENSING SENSITIVITY: 2 mV
MDC IDC STAT BRADY RV PERCENT PACED: 100 %

## 2017-06-07 ENCOUNTER — Ambulatory Visit: Payer: Medicare Other | Admitting: Family Medicine

## 2017-06-12 ENCOUNTER — Ambulatory Visit: Payer: Medicare Other

## 2017-06-14 ENCOUNTER — Encounter: Payer: Self-pay | Admitting: Family Medicine

## 2017-06-14 ENCOUNTER — Ambulatory Visit: Payer: Medicare Other | Admitting: Family Medicine

## 2017-06-14 VITALS — BP 120/70 | HR 76 | Temp 97.7°F | Wt 152.1 lb

## 2017-06-14 DIAGNOSIS — E785 Hyperlipidemia, unspecified: Secondary | ICD-10-CM | POA: Diagnosis not present

## 2017-06-14 DIAGNOSIS — Z23 Encounter for immunization: Secondary | ICD-10-CM

## 2017-06-14 DIAGNOSIS — I10 Essential (primary) hypertension: Secondary | ICD-10-CM | POA: Diagnosis not present

## 2017-06-14 DIAGNOSIS — I482 Chronic atrial fibrillation, unspecified: Secondary | ICD-10-CM

## 2017-06-14 DIAGNOSIS — E1165 Type 2 diabetes mellitus with hyperglycemia: Secondary | ICD-10-CM | POA: Diagnosis not present

## 2017-06-14 DIAGNOSIS — I5043 Acute on chronic combined systolic (congestive) and diastolic (congestive) heart failure: Secondary | ICD-10-CM | POA: Diagnosis not present

## 2017-06-14 LAB — POCT GLYCOSYLATED HEMOGLOBIN (HGB A1C): Hemoglobin A1C: 6.5

## 2017-06-14 NOTE — Progress Notes (Signed)
Subjective:     Patient ID: Yolanda Huffman, female   DOB: 02/13/1941, 76 y.o.   MRN: [id number]  HPI Patient seen for medical follow-up. She has history of chronic kidney disease followed by nephrology, type 2 diabetes, history of atrial fibrillation on Coumadin, hyperlipidemia, history of gout, hypertension, CAD. No recent chest pains. Blood sugars been very stable. She is overdue for eye exam.  Medications reviewed. Compliant with all. Lipids were stable last May. Denies any recent falls. Appetite and weight are stable.  Past Medical History:  Diagnosis Date  . Anemia   . Anemia   . Arthritis    gout  . Atrial fibrillation ([place])    prior Sotalol - d/c'd 2/2 increased creatinine; rate control strategy  . CAD (coronary artery disease)    s/p PCI in [place] in 1/09  . Cancer [facility])    left breast  . Chronic combined systolic and diastolic heart failure (HCC)    Echocardiogram 3/12: Mild LVH, EF 40-81%, normal diastolic function, mild AI, mild MR, PASP 44, normal wall motion  . CKD (chronic kidney disease)    sees Dr. Burman Foster - Stage 3 ?  Marland Kitchen Diabetes mellitus   . Hyperlipemia   . Hypertension   . MI, old    2009  . Osteoporosis   . Pacemaker   . Pleural effusion   . Pneumonia   . PUD (peptic ulcer disease) 10/2010   duodenal ulcer   Past Surgical History:  Procedure Laterality Date  . BREAST LUMPECTOMY WITH RADIOACTIVE SEED LOCALIZATION Left 09/19/2016   Procedure: LEFT BREAST LUMPECTOMY WITH RADIOACTIVE SEED LOCALIZATION;  Surgeon: Autumn Messing III, MD;  Location: [facility];  Service: General;  Laterality: Left;  . CATARACT EXTRACTION Bilateral    01/12/09 and 01/26/09 both eyes  . COLONOSCOPY    . CORONARY ANGIOPLASTY  2009  . PACEMAKER INSERTION  07/13/07  . stent implant  07/13/2007    reports that she quit smoking about 25 years ago. Her smoking use included cigarettes. She has a 40.00 pack-year smoking history. she has never used smokeless tobacco. She reports that she does not drink  alcohol or use drugs. family history includes Breast cancer (age of onset: 28) in her other; Breast cancer (age of onset: 30) in her maternal aunt; Breast cancer (age of onset: 90) in her sister; Clotting disorder in her father; Heart attack in her father; Heart disease in her father and mother; Hypertension in her father. Allergies  Allergen Reactions  . Amlodipine Besylate Swelling    edema     Review of Systems  Constitutional: Negative for appetite change, fatigue and unexpected weight change.  Eyes: Negative for visual disturbance.  Respiratory: Negative for cough, chest tightness, shortness of breath and wheezing.   Cardiovascular: Negative for chest pain, palpitations and leg swelling.  Gastrointestinal: Negative for abdominal pain.  Endocrine: Negative for polydipsia and polyuria.  Genitourinary: Negative for dysuria.  Neurological: Negative for dizziness, seizures, syncope, weakness, light-headedness and headaches.       Objective:   Physical Exam  Constitutional: She appears well-developed and well-nourished.  HENT:  Mouth/Throat: Oropharynx is clear and moist.  Eyes: Pupils are equal, round, and reactive to light.  Neck: Neck supple. No JVD present. No thyromegaly present.  Cardiovascular: Normal rate and regular rhythm. Exam reveals no gallop.  Pulmonary/Chest: Effort normal and breath sounds normal. No respiratory distress. She has no wheezes. She has no rales.  Musculoskeletal: She exhibits no edema.  Neurological: She is alert.  Assessment:     #1 hypertension stable and at goal  #2 chronic kidney disease followed by nephrology  #3 type 2 diabetes stable with A1c today 6.5%  #4 atrial fibrillation on Coumadin. She appears to be in sinus rhythm today clinically    Plan:     -flu vaccine given -Plan six-month follow-up since her blood sugars been very stable -She is encouraged to set up eye exam within the next few months -Continue current  medications  Eulas Post MD [facility]

## 2017-06-26 ENCOUNTER — Ambulatory Visit (INDEPENDENT_AMBULATORY_CARE_PROVIDER_SITE_OTHER): Payer: Medicare Other | Admitting: General Practice

## 2017-06-26 ENCOUNTER — Ambulatory Visit (INDEPENDENT_AMBULATORY_CARE_PROVIDER_SITE_OTHER): Payer: Self-pay | Admitting: *Deleted

## 2017-06-26 DIAGNOSIS — Z7901 Long term (current) use of anticoagulants: Secondary | ICD-10-CM

## 2017-06-26 DIAGNOSIS — Z950 Presence of cardiac pacemaker: Secondary | ICD-10-CM

## 2017-06-26 LAB — POCT INR: INR: 2.5

## 2017-06-26 NOTE — Patient Instructions (Addendum)
Pre visit review using our clinic review tool, if applicable. No additional management support is needed unless otherwise documented below in the visit note.  Continue to take 1/2 tablet on Monday, Wednesdays and Fridays and 1 tablet all other days.  Re-check in 4 week.

## 2017-06-28 NOTE — Progress Notes (Signed)
Remote pacemaker transmission.   

## 2017-06-29 LAB — CUP PACEART REMOTE DEVICE CHECK
Battery Voltage: 2.7 V
Implantable Lead Implant Date: 20090116
Implantable Lead Location: 753859
Implantable Lead Model: 5076
Implantable Pulse Generator Implant Date: 20090116
Lead Channel Pacing Threshold Amplitude: 1.375 V
Lead Channel Pacing Threshold Pulse Width: 0.4 ms
Lead Channel Setting Pacing Amplitude: 2.75 V
MDC IDC LEAD IMPLANT DT: 20090116
MDC IDC LEAD LOCATION: 753860
MDC IDC MSMT BATTERY IMPEDANCE: 3996 Ohm
MDC IDC MSMT BATTERY REMAINING LONGEVITY: 12 mo
MDC IDC MSMT LEADCHNL RA IMPEDANCE VALUE: 67 Ohm
MDC IDC MSMT LEADCHNL RV IMPEDANCE VALUE: 473 Ohm
MDC IDC SESS DTM: 20181231160701
MDC IDC SET LEADCHNL RV PACING PULSEWIDTH: 0.4 ms
MDC IDC SET LEADCHNL RV SENSING SENSITIVITY: 2 mV
MDC IDC STAT BRADY RV PERCENT PACED: 100 %

## 2017-06-30 ENCOUNTER — Encounter: Payer: Self-pay | Admitting: Cardiology

## 2017-07-22 ENCOUNTER — Emergency Department (HOSPITAL_COMMUNITY): Payer: Medicare Other

## 2017-07-22 ENCOUNTER — Encounter (HOSPITAL_COMMUNITY): Payer: Self-pay

## 2017-07-22 ENCOUNTER — Inpatient Hospital Stay (HOSPITAL_COMMUNITY)
Admission: EM | Admit: 2017-07-22 | Discharge: 2017-07-25 | DRG: 470 | Disposition: A | Discharge status: Skilled Nursing Facility | Payer: Medicare Other | Attending: Internal Medicine | Admitting: Internal Medicine

## 2017-07-22 ENCOUNTER — Other Ambulatory Visit: Payer: Self-pay

## 2017-07-22 DIAGNOSIS — Z950 Presence of cardiac pacemaker: Secondary | ICD-10-CM | POA: Diagnosis present

## 2017-07-22 DIAGNOSIS — Z7901 Long term (current) use of anticoagulants: Secondary | ICD-10-CM

## 2017-07-22 DIAGNOSIS — M1030 Gout due to renal impairment, unspecified site: Secondary | ICD-10-CM | POA: Diagnosis present

## 2017-07-22 DIAGNOSIS — Z6825 Body mass index (BMI) 25.0-25.9, adult: Secondary | ICD-10-CM | POA: Diagnosis not present

## 2017-07-22 DIAGNOSIS — I10 Essential (primary) hypertension: Secondary | ICD-10-CM | POA: Diagnosis present

## 2017-07-22 DIAGNOSIS — Y92007 Garden or yard of unspecified non-institutional (private) residence as the place of occurrence of the external cause: Secondary | ICD-10-CM | POA: Diagnosis not present

## 2017-07-22 DIAGNOSIS — W19XXXA Unspecified fall, initial encounter: Secondary | ICD-10-CM

## 2017-07-22 DIAGNOSIS — Z96641 Presence of right artificial hip joint: Secondary | ICD-10-CM

## 2017-07-22 DIAGNOSIS — Z9842 Cataract extraction status, left eye: Secondary | ICD-10-CM | POA: Diagnosis not present

## 2017-07-22 DIAGNOSIS — Z832 Family history of diseases of the blood and blood-forming organs and certain disorders involving the immune mechanism: Secondary | ICD-10-CM

## 2017-07-22 DIAGNOSIS — I129 Hypertensive chronic kidney disease with stage 1 through stage 4 chronic kidney disease, or unspecified chronic kidney disease: Secondary | ICD-10-CM | POA: Diagnosis present

## 2017-07-22 DIAGNOSIS — I252 Old myocardial infarction: Secondary | ICD-10-CM | POA: Diagnosis not present

## 2017-07-22 DIAGNOSIS — Y9389 Activity, other specified: Secondary | ICD-10-CM | POA: Diagnosis not present

## 2017-07-22 DIAGNOSIS — D62 Acute posthemorrhagic anemia: Secondary | ICD-10-CM | POA: Diagnosis not present

## 2017-07-22 DIAGNOSIS — M810 Age-related osteoporosis without current pathological fracture: Secondary | ICD-10-CM | POA: Diagnosis present

## 2017-07-22 DIAGNOSIS — S72041A Displaced fracture of base of neck of right femur, initial encounter for closed fracture: Secondary | ICD-10-CM | POA: Diagnosis not present

## 2017-07-22 DIAGNOSIS — I482 Chronic atrial fibrillation: Secondary | ICD-10-CM | POA: Diagnosis not present

## 2017-07-22 DIAGNOSIS — E785 Hyperlipidemia, unspecified: Secondary | ICD-10-CM | POA: Diagnosis present

## 2017-07-22 DIAGNOSIS — I2510 Atherosclerotic heart disease of native coronary artery without angina pectoris: Secondary | ICD-10-CM | POA: Diagnosis present

## 2017-07-22 DIAGNOSIS — I5043 Acute on chronic combined systolic (congestive) and diastolic (congestive) heart failure: Secondary | ICD-10-CM | POA: Diagnosis not present

## 2017-07-22 DIAGNOSIS — Z419 Encounter for procedure for purposes other than remedying health state, unspecified: Secondary | ICD-10-CM

## 2017-07-22 DIAGNOSIS — Z87891 Personal history of nicotine dependence: Secondary | ICD-10-CM | POA: Diagnosis not present

## 2017-07-22 DIAGNOSIS — D0512 Intraductal carcinoma in situ of left breast: Secondary | ICD-10-CM | POA: Diagnosis present

## 2017-07-22 DIAGNOSIS — W19XXXD Unspecified fall, subsequent encounter: Secondary | ICD-10-CM | POA: Diagnosis not present

## 2017-07-22 DIAGNOSIS — W010XXA Fall on same level from slipping, tripping and stumbling without subsequent striking against object, initial encounter: Secondary | ICD-10-CM | POA: Diagnosis present

## 2017-07-22 DIAGNOSIS — Z79899 Other long term (current) drug therapy: Secondary | ICD-10-CM

## 2017-07-22 DIAGNOSIS — Z794 Long term (current) use of insulin: Secondary | ICD-10-CM

## 2017-07-22 DIAGNOSIS — Z9841 Cataract extraction status, right eye: Secondary | ICD-10-CM | POA: Diagnosis not present

## 2017-07-22 DIAGNOSIS — I4891 Unspecified atrial fibrillation: Secondary | ICD-10-CM | POA: Diagnosis present

## 2017-07-22 DIAGNOSIS — R630 Anorexia: Secondary | ICD-10-CM | POA: Diagnosis present

## 2017-07-22 DIAGNOSIS — S72001A Fracture of unspecified part of neck of right femur, initial encounter for closed fracture: Principal | ICD-10-CM | POA: Diagnosis present

## 2017-07-22 DIAGNOSIS — Z79811 Long term (current) use of aromatase inhibitors: Secondary | ICD-10-CM

## 2017-07-22 DIAGNOSIS — E1122 Type 2 diabetes mellitus with diabetic chronic kidney disease: Secondary | ICD-10-CM | POA: Diagnosis present

## 2017-07-22 DIAGNOSIS — N184 Chronic kidney disease, stage 4 (severe): Secondary | ICD-10-CM | POA: Diagnosis present

## 2017-07-22 DIAGNOSIS — Z955 Presence of coronary angioplasty implant and graft: Secondary | ICD-10-CM | POA: Diagnosis not present

## 2017-07-22 DIAGNOSIS — I5042 Chronic combined systolic (congestive) and diastolic (congestive) heart failure: Secondary | ICD-10-CM

## 2017-07-22 DIAGNOSIS — I130 Hypertensive heart and chronic kidney disease with heart failure and stage 1 through stage 4 chronic kidney disease, or unspecified chronic kidney disease: Secondary | ICD-10-CM | POA: Diagnosis present

## 2017-07-22 DIAGNOSIS — E876 Hypokalemia: Secondary | ICD-10-CM | POA: Diagnosis present

## 2017-07-22 DIAGNOSIS — Z803 Family history of malignant neoplasm of breast: Secondary | ICD-10-CM

## 2017-07-22 DIAGNOSIS — M25551 Pain in right hip: Secondary | ICD-10-CM | POA: Diagnosis present

## 2017-07-22 DIAGNOSIS — D649 Anemia, unspecified: Secondary | ICD-10-CM | POA: Diagnosis present

## 2017-07-22 DIAGNOSIS — Z888 Allergy status to other drugs, medicaments and biological substances status: Secondary | ICD-10-CM

## 2017-07-22 DIAGNOSIS — Z8249 Family history of ischemic heart disease and other diseases of the circulatory system: Secondary | ICD-10-CM

## 2017-07-22 LAB — CBC
HEMATOCRIT: 37.7 % (ref 36.0–46.0)
Hemoglobin: 13.1 g/dL (ref 12.0–15.0)
MCH: 30.8 pg (ref 26.0–34.0)
MCHC: 34.7 g/dL (ref 30.0–36.0)
MCV: 88.5 fL (ref 78.0–100.0)
PLATELETS: 199 10*3/uL (ref 150–400)
RBC: 4.26 MIL/uL (ref 3.87–5.11)
RDW: 13.7 % (ref 11.5–15.5)
WBC: 9.8 10*3/uL (ref 4.0–10.5)

## 2017-07-22 LAB — SURGICAL PCR SCREEN
MRSA, PCR: NEGATIVE
STAPHYLOCOCCUS AUREUS: NEGATIVE

## 2017-07-22 LAB — GLUCOSE, CAPILLARY
GLUCOSE-CAPILLARY: 131 mg/dL — AB (ref 65–99)
Glucose-Capillary: 264 mg/dL — ABNORMAL HIGH (ref 65–99)

## 2017-07-22 LAB — BASIC METABOLIC PANEL
ANION GAP: 15 (ref 5–15)
BUN: 53 mg/dL — AB (ref 6–20)
CALCIUM: 8.5 mg/dL — AB (ref 8.9–10.3)
CO2: 20 mmol/L — AB (ref 22–32)
CREATININE: 2.52 mg/dL — AB (ref 0.44–1.00)
Chloride: 98 mmol/L — ABNORMAL LOW (ref 101–111)
GFR calc Af Amer: 20 mL/min — ABNORMAL LOW (ref >60)
GFR, EST NON AFRICAN AMERICAN: 17 mL/min — AB (ref >60)
GLUCOSE: 112 mg/dL — AB (ref 65–99)
Potassium: 3.1 mmol/L — ABNORMAL LOW (ref 3.5–5.1)
Sodium: 133 mmol/L — ABNORMAL LOW (ref 135–145)

## 2017-07-22 LAB — PROTIME-INR
INR: 2.2
Prothrombin Time: 24.3 seconds — ABNORMAL HIGH (ref 11.4–15.2)

## 2017-07-22 LAB — APTT: APTT: 39 s — AB (ref 24–36)

## 2017-07-22 MED ORDER — VITAMIN K1 10 MG/ML IJ SOLN
10.0000 mg | Freq: Once | INTRAVENOUS | Status: AC
  Administered 2017-07-22: 10 mg via INTRAVENOUS
  Filled 2017-07-22: qty 1

## 2017-07-22 MED ORDER — FUROSEMIDE 40 MG PO TABS: 80.0000 mg | ORAL_TABLET | Freq: Every day | ORAL | Status: DC

## 2017-07-22 MED ORDER — SODIUM CHLORIDE 0.9 % IV BOLUS (SEPSIS)
1000.0000 mL | Freq: Once | INTRAVENOUS | Status: AC
  Administered 2017-07-22: 1000 mL via INTRAVENOUS

## 2017-07-22 MED ORDER — ATORVASTATIN CALCIUM 40 MG PO TABS
40.0000 mg | ORAL_TABLET | Freq: Every day | ORAL | Status: DC
  Administered 2017-07-22 – 2017-07-25 (×3): 40 mg via ORAL
  Filled 2017-07-22 (×3): qty 1

## 2017-07-22 MED ORDER — FUROSEMIDE 40 MG PO TABS
40.0000 mg | ORAL_TABLET | Freq: Every day | ORAL | Status: DC
  Administered 2017-07-22 – 2017-07-24 (×3): 40 mg via ORAL
  Filled 2017-07-22 (×3): qty 1

## 2017-07-22 MED ORDER — OMEGA-3-ACID ETHYL ESTERS 1 G PO CAPS
2.0000 g | ORAL_CAPSULE | Freq: Two times a day (BID) | ORAL | Status: DC
  Administered 2017-07-22 – 2017-07-25 (×5): 2 g via ORAL
  Filled 2017-07-22 (×6): qty 2

## 2017-07-22 MED ORDER — ONDANSETRON HCL 4 MG/2ML IJ SOLN
4.0000 mg | Freq: Once | INTRAMUSCULAR | Status: AC
  Administered 2017-07-22: 4 mg via INTRAVENOUS
  Filled 2017-07-22: qty 2

## 2017-07-22 MED ORDER — PANTOPRAZOLE SODIUM 40 MG PO TBEC
40.0000 mg | DELAYED_RELEASE_TABLET | Freq: Two times a day (BID) | ORAL | Status: DC
  Administered 2017-07-22 – 2017-07-25 (×5): 40 mg via ORAL
  Filled 2017-07-22 (×5): qty 1

## 2017-07-22 MED ORDER — POTASSIUM CHLORIDE CRYS ER 20 MEQ PO TBCR
40.0000 meq | EXTENDED_RELEASE_TABLET | Freq: Once | ORAL | Status: AC
  Administered 2017-07-22: 40 meq via ORAL
  Filled 2017-07-22: qty 2

## 2017-07-22 MED ORDER — CALCITRIOL 0.25 MCG PO CAPS
0.2500 ug | ORAL_CAPSULE | ORAL | Status: DC
  Administered 2017-07-24: 0.25 ug via ORAL
  Filled 2017-07-22: qty 1

## 2017-07-22 MED ORDER — FENTANYL CITRATE (PF) 100 MCG/2ML IJ SOLN
50.0000 ug | Freq: Once | INTRAMUSCULAR | Status: AC
  Administered 2017-07-22: 50 ug via INTRAVENOUS
  Filled 2017-07-22: qty 2

## 2017-07-22 MED ORDER — INSULIN DETEMIR 100 UNIT/ML ~~LOC~~ SOLN
12.0000 [IU] | Freq: Every day | SUBCUTANEOUS | Status: DC
Start: 2017-07-22 — End: 2017-07-25
  Administered 2017-07-22 – 2017-07-24 (×3): 12 [IU] via SUBCUTANEOUS
  Filled 2017-07-22 (×5): qty 0.12

## 2017-07-22 MED ORDER — ANASTROZOLE 1 MG PO TABS
1.0000 mg | ORAL_TABLET | Freq: Every day | ORAL | Status: DC
  Administered 2017-07-22 – 2017-07-25 (×3): 1 mg via ORAL
  Filled 2017-07-22 (×5): qty 1

## 2017-07-22 MED ORDER — HYDROCODONE-ACETAMINOPHEN 5-325 MG PO TABS
1.0000 | ORAL_TABLET | Freq: Four times a day (QID) | ORAL | Status: DC | PRN
  Administered 2017-07-22: 1 via ORAL
  Administered 2017-07-23: 2 via ORAL
  Administered 2017-07-23 – 2017-07-24 (×4): 1 via ORAL
  Administered 2017-07-25: 2 via ORAL
  Filled 2017-07-22: qty 2
  Filled 2017-07-22 (×3): qty 1
  Filled 2017-07-22: qty 2
  Filled 2017-07-22 (×2): qty 1

## 2017-07-22 MED ORDER — SORBITOL 70 % SOLN: 30.0000 mL | Freq: Every day | Status: DC | PRN

## 2017-07-22 MED ORDER — ACETAMINOPHEN 325 MG PO TABS
325.0000 mg | ORAL_TABLET | Freq: Four times a day (QID) | ORAL | Status: DC | PRN
  Administered 2017-07-23: 650 mg via ORAL
  Filled 2017-07-22: qty 2

## 2017-07-22 MED ORDER — METOPROLOL TARTRATE 25 MG PO TABS: 25.0000 mg | ORAL_TABLET | Freq: Every day | ORAL | Status: DC

## 2017-07-22 MED ORDER — METOPROLOL SUCCINATE ER 50 MG PO TB24
50.0000 mg | ORAL_TABLET | Freq: Every day | ORAL | Status: DC
  Administered 2017-07-22 – 2017-07-25 (×4): 50 mg via ORAL
  Filled 2017-07-22 (×4): qty 1

## 2017-07-22 MED ORDER — FENTANYL CITRATE (PF) 100 MCG/2ML IJ SOLN
50.0000 ug | Freq: Once | INTRAMUSCULAR | Status: AC
Start: 2017-07-22 — End: 2017-07-22
  Administered 2017-07-22: 50 ug via INTRAVENOUS
  Filled 2017-07-22: qty 2

## 2017-07-22 MED ORDER — ADULT MULTIVITAMIN W/MINERALS CH
1.0000 | ORAL_TABLET | Freq: Every day | ORAL | Status: DC
  Administered 2017-07-22 – 2017-07-25 (×3): 1 via ORAL
  Filled 2017-07-22 (×3): qty 1

## 2017-07-22 MED ORDER — MORPHINE SULFATE (PF) 4 MG/ML IV SOLN
0.5000 mg | INTRAVENOUS | Status: DC | PRN
  Administered 2017-07-22 – 2017-07-23 (×2): 0.52 mg via INTRAVENOUS
  Filled 2017-07-22 (×2): qty 1

## 2017-07-22 MED ORDER — CRANBERRY 500 MG PO CAPS: 500.0000 mg | ORAL_CAPSULE | Freq: Every day | ORAL | Status: DC

## 2017-07-22 MED ORDER — SODIUM CHLORIDE 0.9 % IV SOLN
INTRAVENOUS | Status: DC
  Administered 2017-07-22 – 2017-07-23 (×2): via INTRAVENOUS

## 2017-07-22 MED ORDER — METOLAZONE 2.5 MG PO TABS: 2.5000 mg | ORAL_TABLET | ORAL | Status: DC

## 2017-07-22 MED ORDER — POTASSIUM CHLORIDE CRYS ER 20 MEQ PO TBCR
20.0000 meq | EXTENDED_RELEASE_TABLET | Freq: Two times a day (BID) | ORAL | Status: DC
  Administered 2017-07-22 – 2017-07-24 (×4): 20 meq via ORAL
  Filled 2017-07-22 (×4): qty 1

## 2017-07-22 MED ORDER — POLYETHYLENE GLYCOL 3350 17 G PO PACK: 17.0000 g | PACK | Freq: Every day | ORAL | Status: DC | PRN

## 2017-07-22 MED ORDER — DOCUSATE SODIUM 100 MG PO CAPS
100.0000 mg | ORAL_CAPSULE | Freq: Two times a day (BID) | ORAL | Status: DC
  Administered 2017-07-22 – 2017-07-25 (×5): 100 mg via ORAL
  Filled 2017-07-22 (×5): qty 1

## 2017-07-22 NOTE — ED Notes (Signed)
Got patient undress on the monitor patient is resting with call bell in reach 

## 2017-07-22 NOTE — ED Provider Notes (Signed)
[facility] EMERGENCY DEPARTMENT Provider Note   CSN: [id number] Arrival date & time: 07/22/17  1191     History   Chief Complaint Chief Complaint  Patient presents with  . Fall    HPI Yolanda Huffman is a 77 y.o. female with past medical history of anemia, arthritis, A. fib, on chronic anticoagulant Coumadin, CAD maker, left breast cancer, CHF, CKD, DM, hypertension, hyperlipidemia stenting via EMS after a mechanical trip and fall outside her house that she was attempting to fix a bird feeder.  Reports that she was using a cane was dressed for cold weather.  She fell on her right side and has pain in her right lower extremity including the right hip and right knee.  Denies any head trauma or loss of consciousness.  Is currently on Coumadin missed her morning dose.  Denies any prodrome prior to the fall, no shortness of breath, chest pain, dizziness, headache or other symptoms.            HPI  Past Medical History:  Diagnosis Date  . Anemia   . Anemia   . Arthritis    gout  . Atrial fibrillation ([place])    prior Sotalol - d/c'd 2/2 increased creatinine; rate control strategy  . CAD (coronary artery disease)    s/p PCI in [place] in 1/09  . Cancer [facility])    left breast  . Chronic combined systolic and diastolic heart failure (HCC)    Echocardiogram 3/12: Mild LVH, EF 47-82%, normal diastolic function, mild AI, mild MR, PASP 44, normal wall motion  . CKD (chronic kidney disease)    sees Dr. Burman Foster - Stage 3 ?  Marland Kitchen Diabetes mellitus   . Hyperlipemia   . Hypertension   . MI, old    2009  . Osteoporosis   . Pacemaker   . Pleural effusion   . Pneumonia   . PUD (peptic ulcer disease) 10/2010   duodenal ulcer    Patient Active Problem List   Diagnosis Date Noted  . Long term (current) use of anticoagulants 03/08/2017  . Genetic testing 09/07/2016  . 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 Gout 11/03/2015  . Personal history of colonic  polyps 04/15/2015  . Chronic anticoagulation 04/15/2015  . Insulin dependent diabetes mellitus ([place]) 04/15/2015  . Encounter for therapeutic drug monitoring 08/01/2013  . CKD (chronic kidney disease) 06/14/2011  . Acute on chronic combined systolic and diastolic heart failure ([place]) 06/14/2011  . Pleural effusion 06/10/2011  . DOE (dyspnea on exertion) 06/10/2011  . Edema 11/30/2010  . Duodenal ulcer 11/12/2010  . Pneumonia 11/12/2010  . Anemia associated with acute blood loss 08/12/2010  . BLOOD IN STOOL 08/06/2010  . ATRIAL FIBRILLATION 03/24/2010  . Type 2 diabetes mellitus, uncontrolled ([place]) 02/23/2010  . Hyperlipidemia 02/23/2010  . Essential hypertension 02/23/2010  . CORONARY ARTERY DISEASE 02/23/2010  . UTI'S, RECURRENT 02/23/2010  . Osteoporosis 02/23/2010  . CARDIAC PACEMAKER IN SITU 02/23/2010    Past Surgical History:  Procedure Laterality Date  . BREAST LUMPECTOMY WITH RADIOACTIVE SEED LOCALIZATION Left 09/19/2016   Procedure: LEFT BREAST LUMPECTOMY WITH RADIOACTIVE SEED LOCALIZATION;  Surgeon: Autumn Messing III, MD;  Location: [place] [facility];  Service: General;  Laterality: Left;  . CATARACT EXTRACTION Bilateral    01/12/09 and 01/26/09 both eyes  . COLONOSCOPY    . CORONARY ANGIOPLASTY  2009  . PACEMAKER INSERTION  07/13/07  . stent implant  07/13/2007    OB History  No data available       Home Medications    Prior to Admission medications   Medication Sig Start Date End Date Taking? Authorizing Provider  anastrozole (ARIMIDEX) 1 MG tablet Take 1 tablet (1 mg total) by mouth daily. 09/27/16   Magrinat, Virgie Dad, MD  atorvastatin (LIPITOR) 40 MG tablet TAKE ONE TABLET BY MOUTH DAILY 04/20/17   Burchette, Alinda Sierras, MD  calcitRIOL (ROCALTROL) 0.25 MCG capsule Take 0.25 mcg by mouth every Monday, Wednesday, and Friday. In the morning. 10/13/14   [provider]  colchicine 0.6 MG tablet TAKE 1 TABLET (0.6 MG TOTAL) BY MOUTH AS NEEDED AS DIRECTED. 05/12/15   Burchette,  Alinda Sierras, MD  Cranberry 500 MG CAPS Take 500 mg by mouth daily with lunch.    [provider]  furosemide (LASIX) 40 MG tablet TAKE TWO TABLETS BY MOUTH EVERY MORNING AND TAKE ONE TABLET BY MOUTH EVERY EVENING 03/23/17   Burchette, Alinda Sierras, MD  glucose blood test strip 1 each by Other route 3 (three) times daily. Use as instructed     [provider]  HYDROcodone-acetaminophen (NORCO/VICODIN) 5-325 MG tablet Take 1-2 tablets by mouth every 4 (four) hours as needed for moderate pain or severe pain. 09/19/16   Autumn Messing III, MD  insulin detemir (LEVEMIR) 100 UNIT/ML injection Inject 12 Units into the skin at bedtime. And on sliding scale 12/22/11   Burchette, Alinda Sierras, MD  metolazone (ZAROXOLYN) 2.5 MG tablet Take 1 tablet (2.5 mg total) by mouth every other day. Take one tab Mon, Wed and Fri.  Take with your lasix.  Take an extra potassium 20 meq on these days 03/02/17 05/31/17  Evans Lance, MD  metoprolol succinate (TOPROL-XL) 25 MG 24 hr tablet TAKE 2 TABLETS BY MOUTH EVERY MORNING AND THEN ONE TABLET BY MOUTH EVERY EVENING 06/02/17   Burchette, Alinda Sierras, MD  Multiple Vitamin (MULTIVITAMIN WITH MINERALS) TABS tablet Take 1 tablet by mouth daily. FOR ADULT 50+    [provider]  Omega-3 Fatty Acids (FISH OIL) 1000 MG CAPS Take 1,000 mg by mouth 2 (two) times daily.    [provider]  pantoprazole (PROTONIX) 40 MG tablet TAKE ONE TABLET BY MOUTH DAILY 03/23/17   Colin Benton R, DO  potassium chloride SA (K-DUR,KLOR-CON) 20 MEQ tablet TAKE TWO TABLETS BY MOUTH TWICE A DAY WITH FOOD AND WATER AS DIRECTED. 12/08/16   Evans Lance, MD  warfarin (COUMADIN) 5 MG tablet TAKE AS DIRECTED BY ANTICOAGULATION CLINIC 02/07/17   Burchette, Alinda Sierras, MD    Family History Family History  Problem Relation Age of Onset  . Heart disease Father   . Heart attack Father   . Clotting disorder Father        blood clot  . Hypertension Father   . Heart disease Mother   . Breast cancer  Maternal Aunt 33  . Breast cancer Sister 63  . Breast cancer Other 40  . Colon cancer Neg Hx     Social History Social History   Tobacco Use  . Smoking status: Former Smoker    Packs/day: 2.00    Years: 20.00    Pack years: 40.00    Types: Cigarettes    Last attempt to quit: 11/26/1991    Years since quitting: 25.6  . Smokeless tobacco: Never Used  Substance Use Topics  . Alcohol use: No  . Drug use: No     Allergies   Amlodipine besylate   Review  of Systems Review of Systems  Constitutional: Negative for chills, diaphoresis and fever.  HENT: Negative for ear pain, facial swelling, sore throat, tinnitus, trouble swallowing and voice change.   Eyes: Negative for photophobia, pain, redness and visual disturbance.  Respiratory: Negative for cough, choking, chest tightness, shortness of breath, wheezing and stridor.   Cardiovascular: Negative for chest pain, palpitations and leg swelling.  Gastrointestinal: Negative for abdominal distention, abdominal pain, nausea and vomiting.  Genitourinary: Negative for difficulty urinating, dysuria and hematuria.  Musculoskeletal: Positive for arthralgias and myalgias. Negative for back pain, joint swelling, neck pain and neck stiffness.  Skin: Negative for color change, pallor, rash and wound.  Neurological: Negative for dizziness, tremors, seizures, syncope, facial asymmetry, speech difficulty, weakness, light-headedness, numbness and headaches.     Physical Exam Updated Vital Signs BP (!) 138/58   Pulse 71   Temp (!) 97.5 F (36.4 C) (Oral)   Resp 15   Ht 5' 3.5" (1.613 m)   Wt 67.1 kg (148 lb)   SpO2 94%   BMI 25.81 kg/m   Physical Exam  Constitutional: She is oriented to person, place, and time. She appears well-developed and well-nourished. No distress.  Afebrile, nontoxic-appearing, lying comfortably in bed no acute distress.  HENT:  Head: Normocephalic and atraumatic.  Right Ear: External ear normal.  Left Ear:  External ear normal.  Mouth/Throat: Oropharynx is clear and moist. No oropharyngeal exudate.  Eyes: Conjunctivae and EOM are normal.  Neck: Normal range of motion. Neck supple.  No tenderness palpation of the cervical spine.  Full range of motion  Cardiovascular: Normal rate, regular rhythm, normal heart sounds and intact distal pulses.  Pulmonary/Chest: Effort normal and breath sounds normal. No stridor. No respiratory distress. She has no wheezes. She has no rales.  Abdominal: Soft. She exhibits no distension. There is no tenderness. There is no guarding.  Musculoskeletal: Normal range of motion. She exhibits tenderness. She exhibits no edema.  Right lower extremity externally rotated.  Patient is unable to flex at the hip.  Tenderness to palpation of the right hip and knee.  No ecchymosis, color change or wound.  Neurological: She is alert and oriented to person, place, and time. No sensory deficit. She exhibits normal muscle tone.  5 out of 5 strength to plantar flexion dorsiflexion bilaterally.  Dorsalis pedis pulses.  Neurovascularly intact.  Skin: Skin is warm and dry. Capillary refill takes less than 2 seconds. No rash noted. She is not diaphoretic. No erythema. No pallor.  Psychiatric: She has a normal mood and affect.  Nursing note and vitals reviewed.    ED Treatments / Results  Labs (all labs ordered are listed, but only abnormal results are displayed) Labs Reviewed  BASIC METABOLIC PANEL - Abnormal; Notable for the following components:      Result Value   Sodium 133 (*)    Potassium 3.1 (*)    Chloride 98 (*)    CO2 20 (*)    Glucose, Bld 112 (*)    BUN 53 (*)    Creatinine, Ser 2.52 (*)    Calcium 8.5 (*)    GFR calc non Af Amer 17 (*)    GFR calc Af Amer 20 (*)    All other components within normal limits  CBC  PROTIME-INR  APTT    EKG  EKG Interpretation None       Radiology Dg Chest 1 View  Result Date: 07/22/2017 CLINICAL DATA:  Fall with right leg  deformity. EXAM: CHEST  1 VIEW COMPARISON:  06/08/2011. FINDINGS: The cardio pericardial silhouette is enlarged. Interstitial markings are diffusely coarsened with chronic features. Bones are diffusely demineralized. Left permanent pacemaker again noted. Telemetry leads overlie the chest. IMPRESSION: Cardiomegaly without acute cardiopulmonary findings. Electronically Signed   By: Misty Stanley M.D.   On: 07/22/2017 11:12   Dg Knee 1-2 Views Right  Result Date: 07/22/2017 CLINICAL DATA:  Known proximal femoral fracture EXAM: RIGHT KNEE - 2 VIEW COMPARISON:  None. FINDINGS: No evidence of fracture, dislocation, or joint effusion. No evidence of arthropathy or other focal bone abnormality. Soft tissues are unremarkable. IMPRESSION: No acute abnormality noted. Electronically Signed   By: Inez Catalina M.D.   On: 07/22/2017 11:13   Dg Hip Unilat W Or Wo Pelvis 2-3 Views Right  Result Date: 07/22/2017 CLINICAL DATA:  Recent fall with right leg pain, initial encounter EXAM: DG HIP (WITH OR WITHOUT PELVIS) 2-3V RIGHT COMPARISON:  None. FINDINGS: Pelvic ring is intact. Calcified uterine fibroid is noted. There is a right femoral neck fracture with impaction at the fracture site. No gross soft tissue abnormality is seen. No other fractures are noted. IMPRESSION: Right femoral neck fracture Electronically Signed   By: Inez Catalina M.D.   On: 07/22/2017 11:13    Procedures Procedures (including critical care time)  Medications Ordered in ED Medications  fentaNYL (SUBLIMAZE) injection 50 mcg (50 mcg Intravenous Given 07/22/17 1038)  ondansetron (ZOFRAN) injection 4 mg (4 mg Intravenous Given 07/22/17 1038)  sodium chloride 0.9 % bolus 1,000 mL (1,000 mLs Intravenous New Bag/Given 07/22/17 1225)  potassium chloride SA (K-DUR,KLOR-CON) CR tablet 40 mEq (40 mEq Oral Given 07/22/17 1223)  fentaNYL (SUBLIMAZE) injection 50 mcg (50 mcg Intravenous Given 07/22/17 1237)     Initial Impression / Assessment and Plan / ED  Course  I have reviewed the triage vital signs and the nursing notes.  Pertinent labs & imaging results that were available during my care of the patient were reviewed by me and considered in my medical decision making (see chart for details).    Presenting after mechanical trip and fall with right hip and right knee pain. Plain films with right femoral neck fracture  Consulted orthopedics  Spoke to dr. Ninfa Linden who will be coming to see patient and she will need to be admitted to [facility] for surgery clearance.  Patient with multiple co-morbidities. Will keep patient n.p.o. and wait for INR and timeframe for surgery.  Will call for admission  Spoke to Dr. Evangeline Gula who will be coming to see patient and admit. Dr. Ninfa Linden has seen patient and recommends a full hip replacement.  On reassessment, patient reported improvement from analgesia.  She was resting comfortably in bed.  Hemodynamically stable.  Patient was discussed with Dr. Marcha Dutton who has also seen patient. Final Clinical Impressions(s) / ED Diagnoses   Final diagnoses:  Fall, initial encounter  Closed fracture of neck of right femur, initial encounter [facility])    ED Discharge Orders    None       Dossie Der 07/22/17 1300    Mabe, Forbes Cellar, MD 07/22/17 [phone]

## 2017-07-22 NOTE — H&P (Signed)
History and Physical    Yolanda Huffman HQR:[id number] DOB: Nov 07, 1940 DOA: 07/22/2017  PCP: Eulas Post, MD   Patient coming from: Home via EMS  I have personally briefly reviewed patient's old medical records in [place]  Chief Complaint: Right hip pain after fall in yard this morning.  HPI: Yolanda Huffman is a 77 y.o. 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  She had noticed that her Shepherd hooks were missing the birdfeeders.  It has been very windy and she went outside to check on the birdfeeders.  As she went outside she stepped on the grass and felt like her foot went into a hole.  Her foot was caught and she was unable to to move and ended up falling landing on her right hip.  Was using a cane when this occurred and she was dressed for the cold weather.  [place] on her right side and is now complaining of right lower extremity pain in the hip and the knee.  She did not hit her head or have any loss of consciousness.  In fact she lay on the ground calling out until a neighbor heard her some 20 minutes later.  She does take warfarin but she did miss her morning dose today.  She denies any dizziness nausea vomiting or prodrome prior to the fall.  She has no shortness of breath or chest pain no headache.  No loss of consciousness.  The patient is very garrulous and provides as much history as the examiner is willing to obtain.  ED Course: Evaluation in the  emergency department revealed a right femoral neck fracture and an INR of 2.2.  Review of Systems: As per HPI otherwise 10 point review of systems negative.    Past Medical History:  Diagnosis Date  . Anemia   . Anemia   . Arthritis    gout  . Atrial fibrillation ([place])    prior Sotalol - d/c'd 2/2 increased creatinine; rate control strategy  . CAD (coronary artery disease)    s/p PCI in [place] in 1/09  . Cancer [facility])    left breast  . Chronic combined systolic and diastolic heart failure (HCC)    Echocardiogram 3/12: Mild LVH, EF 98-26%, normal diastolic function, mild AI, mild MR, PASP 44, normal wall motion  . CKD (chronic kidney disease)    sees Dr. Burman Foster - Stage 3 ?  Marland Kitchen Diabetes mellitus   . Hyperlipemia   . Hypertension   . MI, old    2009  . Osteoporosis   . Pacemaker   . Pleural effusion   . Pneumonia   . PUD (peptic ulcer disease) 10/2010   duodenal ulcer    Past Surgical History:  Procedure Laterality Date  . BREAST LUMPECTOMY WITH RADIOACTIVE SEED LOCALIZATION Left 09/19/2016   Procedure: LEFT BREAST LUMPECTOMY WITH RADIOACTIVE SEED LOCALIZATION;  Surgeon: Autumn Messing III, MD;  Location: [place];  Service: General;  Laterality: Left;  . CATARACT EXTRACTION Bilateral    01/12/09 and 01/26/09 both eyes  . COLONOSCOPY    .  CORONARY ANGIOPLASTY  2009  . PACEMAKER INSERTION  07/13/07  . stent implant  07/13/2007     reports that she quit smoking about 25 years ago. Her smoking use included cigarettes. She has a 40.00 pack-year smoking history. she has never used smokeless tobacco. She reports that she does not drink alcohol or use drugs.  Allergies  Allergen Reactions  . Amlodipine Besylate Swelling    edema    Family History  Problem Relation Age of Onset  . Heart disease Father   . Heart attack Father   . Clotting disorder Father        blood clot  . Hypertension Father   . Heart disease Mother   . Breast cancer Maternal Aunt 69  . Breast cancer Sister 55   . Breast cancer Other 40  . Colon cancer Neg Hx     Prior to Admission medications   Medication Sig Start Date End Date Taking? Authorizing Provider  acetaminophen (TYLENOL) 325 MG tablet Take 325-650 mg by mouth every 6 (six) hours as needed for mild pain.   Yes [provider]  anastrozole (ARIMIDEX) 1 MG tablet Take 1 tablet (1 mg total) by mouth daily. 09/27/16  Yes Magrinat, Virgie Dad, MD  atorvastatin (LIPITOR) 40 MG tablet TAKE ONE TABLET BY MOUTH DAILY Patient taking differently: TAKE ONE TABLET (19m)  BY MOUTH DAILY 04/20/17  Yes Burchette, BAlinda Sierras MD  calcitRIOL (ROCALTROL) 0.25 MCG capsule Take 0.25 mcg by mouth every Monday, Wednesday, and Friday. In the morning. 10/13/14  Yes [provider]  colchicine 0.6 MG tablet TAKE 1 TABLET (0.6 MG TOTAL) BY MOUTH AS NEEDED AS DIRECTED. Patient taking differently: TAKE 1 TABLET (0.6 MG TOTAL) BY MOUTH AS NEEDED AS for gout 05/12/15  Yes Burchette, BAlinda Sierras MD  Cranberry 500 MG CAPS Take 500 mg by mouth daily with lunch.   Yes [provider]  furosemide (LASIX) 40 MG tablet TAKE TWO TABLETS BY MOUTH EVERY MORNING AND TAKE ONE TABLET BY MOUTH EVERY EVENING Patient taking differently: TAKE TWO TABLETS (855m) BY MOUTH EVERY MORNING AND TAKE ONE TABLET (40 mg) BY MOUTH EVERY EVENING 03/23/17  Yes Burchette, BrAlinda SierrasMD  insulin detemir (LEVEMIR) 100 UNIT/ML injection Inject 12 Units into the skin at bedtime. And on sliding scale 12/22/11  Yes Burchette, BrAlinda SierrasMD  metoprolol succinate (TOPROL-XL) 25 MG 24 hr tablet TAKE 2 TABLETS BY MOUTH EVERY MORNING AND THEN ONE TABLET BY MOUTH EVERY EVENING Patient taking differently: TAKE 2 TABLETS ( 5076mBY MOUTH EVERY MORNING AND THEN ONE TABLET BY MOUTH EVERY EVENING 06/02/17  Yes Burchette, BruAlinda SierrasD  Multiple Vitamin (MULTIVITAMIN WITH MINERALS) TABS tablet Take 1 tablet by mouth daily. FOR ADULT 50+   Yes [provider]  Omega-3 Fatty Acids (FISH OIL) 1000 MG CAPS  Take 1,000 mg by mouth 2 (two) times daily.   Yes [provider]  pantoprazole (PROTONIX) 40 MG tablet TAKE ONE TABLET BY MOUTH DAILY Patient taking differently: TAKE ONE TABLET (58m31mBY MOUTH DAILY 03/23/17  Yes Kim,Colin BentonDO  potassium chloride SA (K-DUR,KLOR-CON) 20 MEQ tablet TAKE TWO TABLETS BY MOUTH TWICE A DAY WITH FOOD AND WATER AS DIRECTED. Patient taking differently: Take 20 mEq by mouth 2 (two) times daily. TAKE TWO TABLETS BY MOUTH TWICE A DAY WITH FOOD AND WATER AS DIRECTED. 12/08/16  Yes TaylEvans Lance  warfarin (COUMADIN) 5 MG tablet TAKE AS DIRECTED BY ANTICOAGULATION CLINIC Patient  taking differently: Take 2.5 mg on Mon / Wed / Fri ; Take 5 mg on Tues / Thurs / Sat / Sun 02/07/17  Yes Burchette, Alinda Sierras, MD  glucose blood test strip 1 each by Other route 3 (three) times daily. Use as instructed     [provider]  HYDROcodone-acetaminophen (NORCO/VICODIN) 5-325 MG tablet Take 1-2 tablets by mouth every 4 (four) hours as needed for moderate pain or severe pain. Patient not taking: Reported on 07/22/2017 09/19/16   Autumn Messing III, MD  metolazone (ZAROXOLYN) 2.5 MG tablet Take 1 tablet (2.5 mg total) by mouth every other day. Take one tab Mon, Wed and Fri.  Take with your lasix.  Take an extra potassium 20 meq on these days 03/02/17 05/31/17  Evans Lance, MD    Physical Exam: Vitals:   07/22/17 1145 07/22/17 1215 07/22/17 1245 07/22/17 1300  BP: (!) 156/69 (!) 138/58 (!) 149/63 (!) 150/67  Pulse: 71 71 74 70  Resp: '16 15 19 15  ' Temp:      TempSrc:      SpO2: 96% 94% 96% 92%  Weight:      Height:       .TCS Constitutional: NAD, calm, comfortable Vitals:   07/22/17 1145 07/22/17 1215 07/22/17 1245 07/22/17 1300  BP: (!) 156/69 (!) 138/58 (!) 149/63 (!) 150/67  Pulse: 71 71 74 70  Resp: '16 15 19 15  ' Temp:      TempSrc:      SpO2: 96% 94% 96% 92%  Weight:      Height:       Eyes: PERRL, lids and conjunctivae normal ENMT: Mucous membranes are  moist. Posterior pharynx clear of any exudate or lesions.Normal dentition.  Neck: normal, supple, no masses, no thyromegaly Respiratory: clear to auscultation bilaterally, no wheezing, no crackles. Normal respiratory effort. No accessory muscle use.  Cardiovascular: Regular rate and rhythm, no murmurs / rubs / gallops. No extremity edema. 2+ pedal pulses. No carotid bruits.  Abdomen: no tenderness, no masses palpated. No hepatosplenomegaly. Bowel sounds positive.  Musculoskeletal: no clubbing / cyanosis.  Right lower extremity is shortened and externally rotated.  Is painful to palpation. Skin: no rashes, lesions, ulcers. No induration Neurologic: CN 2-12 grossly intact. Sensation intact, DTR normal. Strength 5/5 in all 4.  Psychiatric: Normal judgment and insight. Alert and oriented x 3. Normal mood.   Labs on Admission: I have personally reviewed following labs and imaging studies  CBC: Recent Labs  Lab 07/22/17 1034  WBC 9.8  HGB 13.1  HCT 37.7  MCV 88.5  PLT 161   Basic Metabolic Panel: Recent Labs  Lab 07/22/17 1034  NA 133*  K 3.1*  CL 98*  CO2 20*  GLUCOSE 112*  BUN 53*  CREATININE 2.52*  CALCIUM 8.5*   GFR: Estimated Creatinine Clearance: 17.4 mL/min (A) (by C-G formula based on SCr of 2.52 mg/dL (H)). Liver Function Tests: No results for input(s): AST, ALT, ALKPHOS, BILITOT, PROT, ALBUMIN in the last 168 hours. No results for input(s): LIPASE, AMYLASE in the last 168 hours. No results for input(s): AMMONIA in the last 168 hours. Coagulation Profile: Recent Labs  Lab 07/22/17 1203  INR 2.20   Cardiac Enzymes: No results for input(s): CKTOTAL, CKMB, CKMBINDEX, TROPONINI in the last 168 hours. BNP (last 3 results) No results for input(s): PROBNP in the last 8760 hours. HbA1C: No results for input(s): HGBA1C in the last 72 hours. CBG: No results for input(s): GLUCAP in the last  168 hours. Lipid Profile: No results for input(s): CHOL, HDL, LDLCALC, TRIG,  CHOLHDL, LDLDIRECT in the last 72 hours. Thyroid Function Tests: No results for input(s): TSH, T4TOTAL, FREET4, T3FREE, THYROIDAB in the last 72 hours. Anemia Panel: No results for input(s): VITAMINB12, FOLATE, FERRITIN, TIBC, IRON, RETICCTPCT in the last 72 hours. Urine analysis:    Component Value Date/Time   COLORURINE LT. YELLOW 07/01/2011 [place] 07/01/2011 1203   LABSPEC 1.010 07/01/2011 1203   PHURINE 6.0 07/01/2011 1203   GLUCOSEU NEGATIVE 07/01/2011 1203   HGBUR TRACE-LYSED 07/01/2011 1203   BILIRUBINUR neg 05/02/2013 1215   KETONESUR NEGATIVE 07/01/2011 1203   PROTEINUR 1 05/02/2013 1215   PROTEINUR NEGATIVE 09/15/2010 0449   UROBILINOGEN 0.2 05/02/2013 1215   UROBILINOGEN 0.2 07/01/2011 1203   NITRITE neg 05/02/2013 1215   NITRITE NEGATIVE 07/01/2011 1203   LEUKOCYTESUR moderate (2+) 05/02/2013 1215    Radiological Exams on Admission: Dg Chest 1 View  Result Date: 07/22/2017 CLINICAL DATA:  Fall with right leg deformity. EXAM: CHEST 1 VIEW COMPARISON:  06/08/2011. FINDINGS: The cardio pericardial silhouette is enlarged. Interstitial markings are diffusely coarsened with chronic features. Bones are diffusely demineralized. Left permanent pacemaker again noted. Telemetry leads overlie the chest. IMPRESSION: Cardiomegaly without acute cardiopulmonary findings. Electronically Signed   By: Misty Stanley M.D.   On: 07/22/2017 11:12   Dg Knee 1-2 Views Right  Result Date: 07/22/2017 CLINICAL DATA:  Known proximal femoral fracture EXAM: RIGHT KNEE - 2 VIEW COMPARISON:  None. FINDINGS: No evidence of fracture, dislocation, or joint effusion. No evidence of arthropathy or other focal bone abnormality. Soft tissues are unremarkable. IMPRESSION: No acute abnormality noted. Electronically Signed   By: Inez Catalina M.D.   On: 07/22/2017 11:13   Dg Hip Unilat W Or Wo Pelvis 2-3 Views Right  Result Date: 07/22/2017 CLINICAL DATA:  Recent fall with right leg pain,  initial encounter EXAM: DG HIP (WITH OR WITHOUT PELVIS) 2-3V RIGHT COMPARISON:  None. FINDINGS: Pelvic ring is intact. Calcified uterine fibroid is noted. There is a right femoral neck fracture with impaction at the fracture site. No gross soft tissue abnormality is seen. No other fractures are noted. IMPRESSION: Right femoral neck fracture Electronically Signed   By: Inez Catalina M.D.   On: 07/22/2017 11:13    EKG: Independently reviewed.  Paced rhythm.  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  Closed displaced fracture of right femoral neck: Orthopedic surgery has been consulted.  I personally met with Dr. Rush Farmer.  The plan is for surgery tomorrow.  Patient's INR is slightly elevated.  He states that she will need a total hip replacement.  She will likely need skilled nursing facility at discharge.  Patient has been admitted to [facility] care in the past and is interested in going back there.  2.  Type 2 diabetes mellitus with chronic kidney disease stage IV and hypertension: Blood pressures currently acceptable.  Renal function is stable for the patient.  Her baseline creatinine is approximately 2.5.  She is currently at 2.3.  3.  Hyperlipidemia: Continue home medication.  4.  Essential hypertension: Continue home metoprolol dosing.  5.  Coronary atherosclerosis: Patient has a history of PTCA fairly remotely.  Will continue home medication management.  She is anticoagulated and that will be on hold for now.  6.  Atrial fibrillation: She with a ventricular paced rhythm.  We will restart warfarin after surgery.  For the present time being I have asked nursing staff to  give the patient V8 and the green so that we can try to get her INR somewhat improved prior to tomorrow.  Try to avoid high doses of vitamin K.  7.  Cardiac pacemaker in situ: Noted.  8.  Acute on chronic combined systolic and diastolic heart failure: Stable and compensated we will continue to monitor and give patient her regular medications.  She does not require telemetry at this time.  9.  Prophylaxis: She is anticoagulated still on her warfarin.  Will start SCDs tomorrow night.  We will continue Protonix dosing as at home.  10.  CODE STATUS: Full code.  DVT prophylaxis: SCDs Code Status: Full Family Communication: I spoke with patient's daughter Kristie Cowman by phone. Disposition Plan: Likely skilled nursing facility preferably [place] [facility]. Consults called: After [place] from orthopedic surgery. Admission status: Inpatient   Lady Deutscher MD FACP [facility] Pager [phone]  If 7PM-7AM, please contact night-coverage [email or web address] Password [facility]  07/22/2017, 2:39 PM

## 2017-07-22 NOTE — ED Triage Notes (Signed)
Pt brought in by [facility] from home for a fall outside this morning. Pt states she stepped outside to check her bird feeders when she feels like she "stepped in a hole and got over balanced". Per EMS pt was outside for 20 min. Pt c/o right leg/hip pain. Pt denies LOC, head/neck/back pain. Pt given 152mcg fentanyl nasal prior to arrival. Pt rates pain 5/10. Pt A+Ox4.

## 2017-07-22 NOTE — ED Notes (Signed)
Pt states she is on warfarin but did not take her morning dose.

## 2017-07-22 NOTE — Consult Note (Signed)
Reason for Consult:  Right hip fracture Referring Physician: Avie Echevaria, PA  Yolanda Huffman is an 77 y.o. female.  HPI: Patient is a very pleasant 77 year old female who had a mechanical fall accidentally earlier today injuring her right hip.  She had inability to ambulate and was brought to the [facility] emergency room.  She does report right hip pain.  X-rays were obtained confirming a right hip displaced femoral neck fracture.  She is alert and at the bedside and her daughter is on the phone for me to talk to but family does live in the area.  She is currently also being evaluated by [facility] for admission and management of her medical issues/comorbidities that are extensive but stable.  She is on Coumadin for atrial fibrillation and has been therapeutic in the past with her INR.  We are waiting for this result today.  She also reports significant right hip pain at the bedside and denies other injuries.  She denies any headache or shortness of breath.  She denies any syncopal episodes or loss of consciousness.  Past Medical History:  Diagnosis Date  . Anemia   . Anemia   . Arthritis    gout  . Atrial fibrillation ([place])    prior Sotalol - d/c'd 2/2 increased creatinine; rate control strategy  . CAD (coronary artery disease)    s/p PCI in [place] in 1/09  . Cancer [facility])    left breast  . Chronic combined systolic and diastolic heart failure (HCC)    Echocardiogram 3/12: Mild LVH, EF 16-10%, normal diastolic function, mild AI, mild MR, PASP 44, normal wall motion  . CKD (chronic kidney disease)    sees Dr. Burman Foster - Stage 3 ?  Marland Kitchen Diabetes mellitus   . Hyperlipemia   . Hypertension   . MI, old    2009  . Osteoporosis   . Pacemaker   . Pleural effusion   . Pneumonia   . PUD (peptic ulcer disease) 10/2010   duodenal ulcer    Past Surgical History:  Procedure Laterality Date  . BREAST LUMPECTOMY WITH RADIOACTIVE SEED LOCALIZATION Left 09/19/2016   Procedure: LEFT  BREAST LUMPECTOMY WITH RADIOACTIVE SEED LOCALIZATION;  Surgeon: Autumn Messing III, MD;  Location: [place];  Service: General;  Laterality: Left;  . CATARACT EXTRACTION Bilateral    01/12/09 and 01/26/09 both eyes  . COLONOSCOPY    . CORONARY ANGIOPLASTY  2009  . PACEMAKER INSERTION  07/13/07  . stent implant  07/13/2007    Family History  Problem Relation Age of Onset  . Heart disease Father   . Heart attack Father   . Clotting disorder Father        blood clot  . Hypertension Father   . Heart disease Mother   . Breast cancer Maternal Aunt 36  . Breast cancer Sister 71  . Breast cancer Other 40  . Colon cancer Neg Hx     Social History:  reports that she quit smoking about 25 years ago. Her smoking use included cigarettes. She has a 40.00 pack-year smoking history. she has never used smokeless tobacco. She reports that she does not drink alcohol or use drugs.  Allergies:  Allergies  Allergen Reactions  . Amlodipine Besylate Swelling    edema    Medications: I have reviewed the patient's current medications.  Results for orders placed or performed during the hospital encounter of 07/22/17 (from the past 48 hour(s))  CBC     Status: None  Collection Time: 07/22/17 10:34 AM  Result Value Ref Range   WBC 9.8 4.0 - 10.5 K/uL   RBC 4.26 3.87 - 5.11 MIL/uL   Hemoglobin 13.1 12.0 - 15.0 g/dL   HCT 37.7 36.0 - 46.0 %   MCV 88.5 78.0 - 100.0 fL   MCH 30.8 26.0 - 34.0 pg   MCHC 34.7 30.0 - 36.0 g/dL   RDW 13.7 11.5 - 15.5 %   Platelets 199 150 - 400 K/uL  Basic metabolic panel     Status: Abnormal   Collection Time: 07/22/17 10:34 AM  Result Value Ref Range   Sodium 133 (L) 135 - 145 mmol/L   Potassium 3.1 (L) 3.5 - 5.1 mmol/L   Chloride 98 (L) 101 - 111 mmol/L   CO2 20 (L) 22 - 32 mmol/L   Glucose, Bld 112 (H) 65 - 99 mg/dL   BUN 53 (H) 6 - 20 mg/dL   Creatinine, Ser 2.52 (H) 0.44 - 1.00 mg/dL   Calcium 8.5 (L) 8.9 - 10.3 mg/dL   GFR calc non Af Amer 17 (L) >60 mL/min   GFR  calc Af Amer 20 (L) >60 mL/min    Comment: (NOTE) The eGFR has been calculated using the CKD EPI equation. This calculation has not been validated in all clinical situations. eGFR's persistently <60 mL/min signify possible Chronic Kidney Disease.    Anion gap 15 5 - 15    Dg Chest 1 View  Result Date: 07/22/2017 CLINICAL DATA:  Fall with right leg deformity. EXAM: CHEST 1 VIEW COMPARISON:  06/08/2011. FINDINGS: The cardio pericardial silhouette is enlarged. Interstitial markings are diffusely coarsened with chronic features. Bones are diffusely demineralized. Left permanent pacemaker again noted. Telemetry leads overlie the chest. IMPRESSION: Cardiomegaly without acute cardiopulmonary findings. Electronically Signed   By: Misty Stanley M.D.   On: 07/22/2017 11:12   Dg Knee 1-2 Views Right  Result Date: 07/22/2017 CLINICAL DATA:  Known proximal femoral fracture EXAM: RIGHT KNEE - 2 VIEW COMPARISON:  None. FINDINGS: No evidence of fracture, dislocation, or joint effusion. No evidence of arthropathy or other focal bone abnormality. Soft tissues are unremarkable. IMPRESSION: No acute abnormality noted. Electronically Signed   By: Inez Catalina M.D.   On: 07/22/2017 11:13   Dg Hip Unilat W Or Wo Pelvis 2-3 Views Right  Result Date: 07/22/2017 CLINICAL DATA:  Recent fall with right leg pain, initial encounter EXAM: DG HIP (WITH OR WITHOUT PELVIS) 2-3V RIGHT COMPARISON:  None. FINDINGS: Pelvic ring is intact. Calcified uterine fibroid is noted. There is a right femoral neck fracture with impaction at the fracture site. No gross soft tissue abnormality is seen. No other fractures are noted. IMPRESSION: Right femoral neck fracture Electronically Signed   By: Inez Catalina M.D.   On: 07/22/2017 11:13   I independently reviewed x-rays of her pelvis and right hip and do agree that she has a displaced right hip femoral neck fracture.   ROS Blood pressure (!) 150/67, pulse 70, temperature (!) 97.5 F (36.4  C), temperature source Oral, resp. rate 15, height 5' 3.5" (1.613 m), weight 148 lb (67.1 kg), SpO2 92 %. Physical Exam  Constitutional: She is oriented to person, place, and time. She appears well-developed and well-nourished.  HENT:  Head: Normocephalic and atraumatic.  Eyes: Pupils are equal, round, and reactive to light.  Neck: Normal range of motion.  Cardiovascular: Normal rate.  Respiratory: Effort normal.  GI: Soft.  Musculoskeletal:       Right hip:  She exhibits decreased range of motion, decreased strength, tenderness, bony tenderness and deformity.  Neurological: She is alert and oriented to person, place, and time.  Skin: Skin is warm and dry.  Psychiatric: She has a normal mood and affect.    Assessment/Plan: Right hip with displaced femoral neck fracture  I spoke with the patient and her daughter at length.  The daughter was via telephone but she does live in the area.  They know that she has a displaced right hip femoral neck fracture.  We have recommended a right total hip arthroplasty through direct anterior approach.  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  They understand fully that this is surgery were recommending to help decrease her pain as well as improve her mobility and her quality of life.  A thorough discussion the risk and benefits of this is been had.  We will let her eat today and perform surgery in the morning first thing tomorrow due to the need to get her INR a little lower.  Mcarthur Rossetti 07/22/2017, 1:14 PM

## 2017-07-22 NOTE — Anesthesia Preprocedure Evaluation (Addendum)
Anesthesia Evaluation  Patient identified by MRN, date of birth, ID band Patient awake    Reviewed: Allergy & Precautions, NPO status , Patient's Chart, lab work & pertinent test results, reviewed documented beta blocker date and time   History of Anesthesia Complications Negative for: history of anesthetic complications  Airway Mallampati: III  TM Distance: >3 FB Neck ROM: Full    Dental  (+) Edentulous Upper, Edentulous Lower   Pulmonary former smoker,    breath sounds clear to auscultation       Cardiovascular hypertension, Pt. on medications and Pt. on home beta blockers + CAD, + Past MI and + Cardiac Stents  + dysrhythmias Atrial Fibrillation + pacemaker  Rhythm:Regular  EKG - Vpaced rhythm   Neuro/Psych negative neurological ROS  negative psych ROS   GI/Hepatic Neg liver ROS, PUD, neg GERD  ,  Endo/Other  diabetes, Insulin Dependent  Renal/GU CRFRenal disease     Musculoskeletal  (+) Arthritis ,   Abdominal   Peds  Hematology  (+) anemia ,   Anesthesia Other Findings   Reproductive/Obstetrics                           Anesthesia Physical  Anesthesia Plan  ASA: III  Anesthesia Plan: General   Post-op Pain Management:    Induction: Intravenous  PONV Risk Score and Plan: 4 or greater  Airway Management Planned: Oral ETT  Additional Equipment: Arterial line  Intra-op Plan:   Post-operative Plan: Extubation in OR  Informed Consent: I have reviewed the patients History and Physical, chart, labs and discussed the procedure including the risks, benefits and alternatives for the proposed anesthesia with the patient or authorized representative who has indicated his/her understanding and acceptance.   Dental advisory given  Plan Discussed with: CRNA  Anesthesia Plan Comments:        Anesthesia Quick Evaluation

## 2017-07-22 NOTE — ED Notes (Signed)
Hospitalist bedside Pt placed on purewick

## 2017-07-22 NOTE — ED Notes (Signed)
Per ortho, pt may eat and drink until midnight. Tentative surgery tomorrow morning

## 2017-07-22 NOTE — ED Notes (Signed)
Patient transported to X-ray 

## 2017-07-22 NOTE — ED Notes (Signed)
Ortho bedside

## 2017-07-22 NOTE — ED Notes (Signed)
Pain assessed, PA aware, orders received

## 2017-07-23 ENCOUNTER — Encounter (HOSPITAL_COMMUNITY)
Admission: EM | Disposition: A | Discharge status: Skilled Nursing Facility | Payer: Self-pay | Source: Home / Self Care | Attending: Internal Medicine

## 2017-07-23 ENCOUNTER — Encounter (HOSPITAL_COMMUNITY): Payer: Self-pay | Admitting: *Deleted

## 2017-07-23 ENCOUNTER — Inpatient Hospital Stay (HOSPITAL_COMMUNITY): Payer: Medicare Other

## 2017-07-23 ENCOUNTER — Inpatient Hospital Stay (HOSPITAL_COMMUNITY): Payer: Medicare Other | Admitting: Anesthesiology

## 2017-07-23 DIAGNOSIS — I5043 Acute on chronic combined systolic (congestive) and diastolic (congestive) heart failure: Secondary | ICD-10-CM

## 2017-07-23 DIAGNOSIS — Z96641 Presence of right artificial hip joint: Secondary | ICD-10-CM

## 2017-07-23 DIAGNOSIS — I10 Essential (primary) hypertension: Secondary | ICD-10-CM

## 2017-07-23 DIAGNOSIS — E1122 Type 2 diabetes mellitus with diabetic chronic kidney disease: Secondary | ICD-10-CM

## 2017-07-23 DIAGNOSIS — I482 Chronic atrial fibrillation: Secondary | ICD-10-CM

## 2017-07-23 DIAGNOSIS — I129 Hypertensive chronic kidney disease with stage 1 through stage 4 chronic kidney disease, or unspecified chronic kidney disease: Secondary | ICD-10-CM

## 2017-07-23 DIAGNOSIS — E785 Hyperlipidemia, unspecified: Secondary | ICD-10-CM

## 2017-07-23 DIAGNOSIS — S72001A Fracture of unspecified part of neck of right femur, initial encounter for closed fracture: Principal | ICD-10-CM

## 2017-07-23 DIAGNOSIS — N184 Chronic kidney disease, stage 4 (severe): Secondary | ICD-10-CM

## 2017-07-23 HISTORY — PX: TOTAL HIP ARTHROPLASTY: SHX124

## 2017-07-23 LAB — APTT: APTT: 31 s (ref 24–36)

## 2017-07-23 LAB — CBC
HEMATOCRIT: 34.5 % — AB (ref 36.0–46.0)
HEMOGLOBIN: 11.9 g/dL — AB (ref 12.0–15.0)
MCH: 30.5 pg (ref 26.0–34.0)
MCHC: 34.5 g/dL (ref 30.0–36.0)
MCV: 88.5 fL (ref 78.0–100.0)
Platelets: 176 10*3/uL (ref 150–400)
RBC: 3.9 MIL/uL (ref 3.87–5.11)
RDW: 14.1 % (ref 11.5–15.5)
WBC: 8.8 10*3/uL (ref 4.0–10.5)

## 2017-07-23 LAB — BASIC METABOLIC PANEL
ANION GAP: 15 (ref 5–15)
BUN: 51 mg/dL — ABNORMAL HIGH (ref 6–20)
CO2: 18 mmol/L — AB (ref 22–32)
CREATININE: 2.36 mg/dL — AB (ref 0.44–1.00)
Calcium: 8.1 mg/dL — ABNORMAL LOW (ref 8.9–10.3)
Chloride: 101 mmol/L (ref 101–111)
GFR calc non Af Amer: 19 mL/min — ABNORMAL LOW (ref >60)
GFR, EST AFRICAN AMERICAN: 22 mL/min — AB (ref >60)
Glucose, Bld: 85 mg/dL (ref 65–99)
Potassium: 3.6 mmol/L (ref 3.5–5.1)
SODIUM: 134 mmol/L — AB (ref 135–145)

## 2017-07-23 LAB — GLUCOSE, CAPILLARY
GLUCOSE-CAPILLARY: 88 mg/dL (ref 65–99)
GLUCOSE-CAPILLARY: 120 mg/dL — AB (ref 65–99)
Glucose-Capillary: 93 mg/dL (ref 65–99)
Glucose-Capillary: 80 mg/dL (ref 65–99)
Glucose-Capillary: 327 mg/dL — ABNORMAL HIGH (ref 65–99)

## 2017-07-23 LAB — PROTIME-INR
INR: 1.49
PROTHROMBIN TIME: 17.9 s — AB (ref 11.4–15.2)

## 2017-07-23 SURGERY — ARTHROPLASTY, HIP, TOTAL, ANTERIOR APPROACH
Anesthesia: General | Site: Leg Upper | Laterality: Right

## 2017-07-23 MED ORDER — ONDANSETRON HCL 4 MG/2ML IJ SOLN: 4.0000 mg | Freq: Four times a day (QID) | INTRAMUSCULAR | Status: DC | PRN

## 2017-07-23 MED ORDER — FENTANYL CITRATE (PF) 100 MCG/2ML IJ SOLN
INTRAMUSCULAR | Status: AC
  Filled 2017-07-23: qty 2

## 2017-07-23 MED ORDER — METHOCARBAMOL 500 MG PO TABS
500.0000 mg | ORAL_TABLET | Freq: Four times a day (QID) | ORAL | Status: DC | PRN
  Administered 2017-07-24 – 2017-07-25 (×2): 500 mg via ORAL
  Filled 2017-07-23 (×2): qty 1

## 2017-07-23 MED ORDER — PROPOFOL 10 MG/ML IV BOLUS
INTRAVENOUS | Status: AC
Start: 2017-07-23 — End: ?
  Filled 2017-07-23: qty 20

## 2017-07-23 MED ORDER — ONDANSETRON HCL 4 MG/2ML IJ SOLN: 4.0000 mg | Freq: Once | INTRAMUSCULAR | Status: DC | PRN

## 2017-07-23 MED ORDER — FENTANYL CITRATE (PF) 100 MCG/2ML IJ SOLN
25.0000 ug | INTRAMUSCULAR | Status: DC | PRN
  Administered 2017-07-23: 25 ug via INTRAVENOUS

## 2017-07-23 MED ORDER — CEFAZOLIN SODIUM-DEXTROSE 2-3 GM-%(50ML) IV SOLR
INTRAVENOUS | Status: DC | PRN
  Administered 2017-07-23: 2 g via INTRAVENOUS

## 2017-07-23 MED ORDER — CEFAZOLIN SODIUM-DEXTROSE 2-4 GM/100ML-% IV SOLN
2.0000 g | INTRAVENOUS | Status: DC
  Filled 2017-07-23: qty 100

## 2017-07-23 MED ORDER — METOCLOPRAMIDE HCL 5 MG PO TABS: 5.0000 mg | ORAL_TABLET | Freq: Three times a day (TID) | ORAL | Status: DC | PRN

## 2017-07-23 MED ORDER — ROCURONIUM BROMIDE 10 MG/ML (PF) SYRINGE
PREFILLED_SYRINGE | INTRAVENOUS | Status: AC
  Filled 2017-07-23: qty 5

## 2017-07-23 MED ORDER — FENTANYL CITRATE (PF) 100 MCG/2ML IJ SOLN
INTRAMUSCULAR | Status: DC | PRN
  Administered 2017-07-23 (×5): 50 ug via INTRAVENOUS

## 2017-07-23 MED ORDER — SUGAMMADEX SODIUM 200 MG/2ML IV SOLN
INTRAVENOUS | Status: DC | PRN
  Administered 2017-07-23: 134.2 mg via INTRAVENOUS

## 2017-07-23 MED ORDER — POVIDONE-IODINE 10 % EX SWAB: 2.0000 "application " | Freq: Once | CUTANEOUS | Status: DC

## 2017-07-23 MED ORDER — PROPOFOL 10 MG/ML IV BOLUS
INTRAVENOUS | Status: DC | PRN
  Administered 2017-07-23: 100 mg via INTRAVENOUS

## 2017-07-23 MED ORDER — MORPHINE SULFATE (PF) 2 MG/ML IV SOLN
0.5000 mg | INTRAVENOUS | Status: DC | PRN
  Administered 2017-07-24: 0.5 mg via INTRAVENOUS
  Filled 2017-07-23: qty 1

## 2017-07-23 MED ORDER — CEFAZOLIN SODIUM-DEXTROSE 2-4 GM/100ML-% IV SOLN
2.0000 g | Freq: Two times a day (BID) | INTRAVENOUS | Status: AC
  Administered 2017-07-24: 2 g via INTRAVENOUS
  Filled 2017-07-23: qty 100

## 2017-07-23 MED ORDER — FENTANYL CITRATE (PF) 250 MCG/5ML IJ SOLN
INTRAMUSCULAR | Status: AC
  Filled 2017-07-23: qty 5

## 2017-07-23 MED ORDER — PHENYLEPHRINE 40 MCG/ML (10ML) SYRINGE FOR IV PUSH (FOR BLOOD PRESSURE SUPPORT)
PREFILLED_SYRINGE | INTRAVENOUS | Status: AC
  Filled 2017-07-23: qty 10

## 2017-07-23 MED ORDER — INSULIN ASPART 100 UNIT/ML ~~LOC~~ SOLN
0.0000 [IU] | Freq: Three times a day (TID) | SUBCUTANEOUS | Status: DC
  Administered 2017-07-24: 2 [IU] via SUBCUTANEOUS

## 2017-07-23 MED ORDER — ONDANSETRON HCL 4 MG/2ML IJ SOLN
INTRAMUSCULAR | Status: AC
  Filled 2017-07-23: qty 2

## 2017-07-23 MED ORDER — WARFARIN - PHARMACIST DOSING INPATIENT: Freq: Every day | Status: DC

## 2017-07-23 MED ORDER — PHENYLEPHRINE HCL 10 MG/ML IJ SOLN
INTRAMUSCULAR | Status: DC | PRN
  Administered 2017-07-23 (×2): 80 ug via INTRAVENOUS

## 2017-07-23 MED ORDER — WARFARIN SODIUM 7.5 MG PO TABS
7.5000 mg | ORAL_TABLET | Freq: Once | ORAL | Status: AC
  Administered 2017-07-23: 7.5 mg via ORAL
  Filled 2017-07-23: qty 1

## 2017-07-23 MED ORDER — ONDANSETRON HCL 4 MG PO TABS: 4.0000 mg | ORAL_TABLET | Freq: Four times a day (QID) | ORAL | Status: DC | PRN

## 2017-07-23 MED ORDER — LIDOCAINE HCL (CARDIAC) 20 MG/ML IV SOLN
INTRAVENOUS | Status: DC | PRN
  Administered 2017-07-23: 80 mg via INTRAVENOUS

## 2017-07-23 MED ORDER — OXYCODONE HCL 5 MG/5ML PO SOLN: 5.0000 mg | Freq: Once | ORAL | Status: DC | PRN

## 2017-07-23 MED ORDER — SUCCINYLCHOLINE CHLORIDE 200 MG/10ML IV SOSY
PREFILLED_SYRINGE | INTRAVENOUS | Status: AC
  Filled 2017-07-23: qty 10

## 2017-07-23 MED ORDER — PROPOFOL 10 MG/ML IV BOLUS
INTRAVENOUS | Status: AC
  Filled 2017-07-23: qty 20

## 2017-07-23 MED ORDER — HYDROCODONE-ACETAMINOPHEN 5-325 MG PO TABS: 1.0000 | ORAL_TABLET | Freq: Four times a day (QID) | ORAL | Status: DC | PRN

## 2017-07-23 MED ORDER — ACETAMINOPHEN 650 MG RE SUPP: 650.0000 mg | Freq: Four times a day (QID) | RECTAL | Status: DC | PRN

## 2017-07-23 MED ORDER — 0.9 % SODIUM CHLORIDE (POUR BTL) OPTIME
TOPICAL | Status: DC | PRN
  Administered 2017-07-23: 1000 mL

## 2017-07-23 MED ORDER — ACETAMINOPHEN 325 MG PO TABS: 650.0000 mg | ORAL_TABLET | Freq: Four times a day (QID) | ORAL | Status: DC | PRN

## 2017-07-23 MED ORDER — METHOCARBAMOL 1000 MG/10ML IJ SOLN
500.0000 mg | Freq: Four times a day (QID) | INTRAVENOUS | Status: DC | PRN
  Filled 2017-07-23: qty 5

## 2017-07-23 MED ORDER — INSULIN ASPART 100 UNIT/ML ~~LOC~~ SOLN
0.0000 [IU] | Freq: Every day | SUBCUTANEOUS | Status: DC
  Administered 2017-07-23: 4 [IU] via SUBCUTANEOUS

## 2017-07-23 MED ORDER — LACTATED RINGERS IV SOLN
INTRAVENOUS | Status: DC | PRN
  Administered 2017-07-23: 11:00:00 via INTRAVENOUS

## 2017-07-23 MED ORDER — PHENOL 1.4 % MT LIQD: 1.0000 | OROMUCOSAL | Status: DC | PRN

## 2017-07-23 MED ORDER — OXYCODONE HCL 5 MG PO TABS: 5.0000 mg | ORAL_TABLET | Freq: Once | ORAL | Status: DC | PRN

## 2017-07-23 MED ORDER — DEXAMETHASONE SODIUM PHOSPHATE 10 MG/ML IJ SOLN
INTRAMUSCULAR | Status: AC
  Filled 2017-07-23: qty 1

## 2017-07-23 MED ORDER — DEXAMETHASONE SODIUM PHOSPHATE 10 MG/ML IJ SOLN
INTRAMUSCULAR | Status: DC | PRN
  Administered 2017-07-23: 10 mg via INTRAVENOUS

## 2017-07-23 MED ORDER — CHLORHEXIDINE GLUCONATE 4 % EX LIQD
60.0000 mL | Freq: Once | CUTANEOUS | Status: AC
  Administered 2017-07-23: 4 via TOPICAL
  Filled 2017-07-23: qty 60

## 2017-07-23 MED ORDER — MENTHOL 3 MG MT LOZG: 1.0000 | LOZENGE | OROMUCOSAL | Status: DC | PRN

## 2017-07-23 MED ORDER — METOCLOPRAMIDE HCL 5 MG/ML IJ SOLN: 5.0000 mg | Freq: Three times a day (TID) | INTRAMUSCULAR | Status: DC | PRN

## 2017-07-23 MED ORDER — ROCURONIUM BROMIDE 100 MG/10ML IV SOLN
INTRAVENOUS | Status: DC | PRN
  Administered 2017-07-23: 40 mg via INTRAVENOUS

## 2017-07-23 SURGICAL SUPPLY — 54 items
BENZOIN TINCTURE PRP APPL 2/3 (GAUZE/BANDAGES/DRESSINGS) ×3 IMPLANT
BLADE CLIPPER SURG (BLADE) IMPLANT
BLADE SAW SGTL 18X1.27X75 (BLADE) ×4 IMPLANT
BLADE SAW SGTL 18X1.27X75MM (BLADE) ×2
CAPT HIP TOTAL 2 ×3 IMPLANT
CELLS DAT CNTRL 66122 CELL SVR (MISCELLANEOUS) ×1 IMPLANT
CLOSURE WOUND 1/2 X4 (GAUZE/BANDAGES/DRESSINGS) ×2
COVER SURGICAL LIGHT HANDLE (MISCELLANEOUS) ×3 IMPLANT
DRAPE C-ARM 42X72 X-RAY (DRAPES) ×3 IMPLANT
DRAPE STERI IOBAN 125X83 (DRAPES) ×3 IMPLANT
DRAPE U-SHAPE 47X51 STRL (DRAPES) ×9 IMPLANT
DRESSING AQUACEL AG SP 3.5X10 (GAUZE/BANDAGES/DRESSINGS) ×1 IMPLANT
DRSG AQUACEL AG ADV 3.5X10 (GAUZE/BANDAGES/DRESSINGS) ×3 IMPLANT
DRSG AQUACEL AG SP 3.5X10 (GAUZE/BANDAGES/DRESSINGS) ×3
DURAPREP 26ML APPLICATOR (WOUND CARE) ×3 IMPLANT
ELECT BLADE 4.0 EZ CLEAN MEGAD (MISCELLANEOUS) ×3
ELECT BLADE 6.5 EXT (BLADE) IMPLANT
ELECT REM PT RETURN 9FT ADLT (ELECTROSURGICAL) ×3
ELECTRODE BLDE 4.0 EZ CLN MEGD (MISCELLANEOUS) ×1 IMPLANT
ELECTRODE REM PT RTRN 9FT ADLT (ELECTROSURGICAL) ×1 IMPLANT
FACESHIELD WRAPAROUND (MASK) ×6 IMPLANT
GAUZE XEROFORM 1X8 LF (GAUZE/BANDAGES/DRESSINGS) ×3 IMPLANT
GLOVE BIOGEL PI IND STRL 8 (GLOVE) ×2 IMPLANT
GLOVE BIOGEL PI INDICATOR 8 (GLOVE) ×4
GLOVE ECLIPSE 8.0 STRL XLNG CF (GLOVE) ×3 IMPLANT
GLOVE ORTHO TXT STRL SZ7.5 (GLOVE) ×6 IMPLANT
GOWN STRL REUS W/ TWL LRG LVL3 (GOWN DISPOSABLE) ×2 IMPLANT
GOWN STRL REUS W/ TWL XL LVL3 (GOWN DISPOSABLE) ×2 IMPLANT
GOWN STRL REUS W/TWL LRG LVL3 (GOWN DISPOSABLE) ×4
GOWN STRL REUS W/TWL XL LVL3 (GOWN DISPOSABLE) ×4
HANDPIECE INTERPULSE COAX TIP (DISPOSABLE) ×2
KIT BASIN OR (CUSTOM PROCEDURE TRAY) ×3 IMPLANT
KIT ROOM TURNOVER OR (KITS) ×3 IMPLANT
MANIFOLD NEPTUNE II (INSTRUMENTS) ×3 IMPLANT
NS IRRIG 1000ML POUR BTL (IV SOLUTION) ×3 IMPLANT
PACK TOTAL JOINT (CUSTOM PROCEDURE TRAY) ×3 IMPLANT
PAD ARMBOARD 7.5X6 YLW CONV (MISCELLANEOUS) ×3 IMPLANT
RTRCTR WOUND ALEXIS 18CM MED (MISCELLANEOUS) ×3
SET HNDPC FAN SPRY TIP SCT (DISPOSABLE) ×1 IMPLANT
STAPLER VISISTAT 35W (STAPLE) IMPLANT
STRIP CLOSURE SKIN 1/2X4 (GAUZE/BANDAGES/DRESSINGS) ×4 IMPLANT
SUT ETHIBOND NAB CT1 #1 30IN (SUTURE) ×3 IMPLANT
SUT MNCRL AB 4-0 PS2 18 (SUTURE) IMPLANT
SUT VIC AB 0 CT1 27 (SUTURE) ×2
SUT VIC AB 0 CT1 27XBRD ANBCTR (SUTURE) ×1 IMPLANT
SUT VIC AB 1 CT1 27 (SUTURE) ×2
SUT VIC AB 1 CT1 27XBRD ANBCTR (SUTURE) ×1 IMPLANT
SUT VIC AB 2-0 CT1 27 (SUTURE) ×4
SUT VIC AB 2-0 CT1 TAPERPNT 27 (SUTURE) ×2 IMPLANT
TOWEL OR 17X24 6PK STRL BLUE (TOWEL DISPOSABLE) ×3 IMPLANT
TOWEL OR 17X26 10 PK STRL BLUE (TOWEL DISPOSABLE) ×3 IMPLANT
TRAY CATH 16FR W/PLASTIC CATH (SET/KITS/TRAYS/PACK) IMPLANT
TRAY FOLEY W/METER SILVER 16FR (SET/KITS/TRAYS/PACK) IMPLANT
WATER STERILE IRR 1000ML POUR (IV SOLUTION) ×6 IMPLANT

## 2017-07-23 NOTE — Brief Op Note (Signed)
07/22/2017 - 07/23/2017  12:41 PM  PATIENT:  Yolanda Huffman  77 y.o. female  PRE-OPERATIVE DIAGNOSIS:  right total hip fracture  POST-OPERATIVE DIAGNOSIS: same  PROCEDURE:  Procedure(s): TOTAL HIP ARTHROPLASTY ANTERIOR APPROACH (Right)  SURGEON:  Surgeon(s) and Role:    Mcarthur Rossetti, MD - Primary  PHYSICIAN ASSISTANT: Benita Stabile, PA-C  ANESTHESIA:   general  EBL:  175 mL   COUNTS:  YES  TOURNIQUET:  * No tourniquets in log *  DICTATION: .Other Dictation: Dictation Number [id number]  PLAN OF CARE: Admit to inpatient   PATIENT DISPOSITION:  PACU - hemodynamically stable.   Delay start of Pharmacological VTE agent (>24hrs) due to surgical blood loss or risk of bleeding: no

## 2017-07-23 NOTE — Progress Notes (Addendum)
Correction: patient still on unit awaiting morning lab results.  Am nurse will contact Melissa in [place] when results are in at [phone].

## 2017-07-23 NOTE — Op Note (Signed)
NAMEMarland Kitchen  ARLETHA, MARSCHKE NO.:  [id number]  MEDICAL RECORD NO.:  [id number]  LOCATION:  5N21C                        FACILITY:  [place]  PHYSICIAN:  Lind Guest. Ninfa Linden, M.D.DATE OF BIRTH:  1940-08-15  DATE OF PROCEDURE:  07/23/2017 DATE OF DISCHARGE:                              OPERATIVE REPORT   PREOPERATIVE DIAGNOSIS:  Displaced right hip femoral neck fracture.  POSTOPERATIVE DIAGNOSIS:  Displaced right hip femoral neck fracture.  PROCEDURE:  Right total hip arthroplasty through direct anterior approach.  IMPLANTS:  DePuy Sector Gription acetabular component size 50, size 32 +0 polyethylene liner, size 12 Corail femoral component with standard offset, size 32 +1 metal hip ball.  SURGEON:  Lind Guest. Ninfa Linden, M.D.  ASSISTANT:  Erskine Emery, PA-C.  ANESTHESIA:  General.  ANTIBIOTICS:  IV Ancef 2 g.  BLOOD LOSS:  175 mL.  COMPLICATIONS:  None.  INDICATIONS:  Ms. Heatley is a 77 year old community ambulator, who lives alone with family nearby.  She has sustained an accidental mechanical fall yesterday injuring her right hip.  She had the inability to ambulate and was brought to the [facility] Emergency Room, where she was found to have a displaced right hip femoral neck fracture.  We delayed surgery to today due to the fact that she is on Coumadin, her INR was 2.2 yesterday.  It is now in the subtherapeutic range today well below 2 for proceeding with surgery.  She understands fully the risks and benefits of the surgery and the reason why we are proceeding with a total hip.  She has not had previous hip pain, but she is young and I felt that this was better surgery than the hemiarthroplasty for her.  We did talk with the risk of acute blood loss anemia, nerve and vessel injury, fracture, infection, dislocation, DVT.  She understands our goals are to decrease pain, improve mobility, and overall improved quality of life.  PROCEDURE DESCRIPTION:   After informed consent was obtained, appropriate right hip was marked, she was brought to the operating room, where general anesthesia was obtained while she was on her stretcher.  A Foley catheter was placed and both feet had traction boots applied to them. Next, she was placed supine on the Hana fracture table with the perineal post in place and both legs in InLine skeletal traction devices, but no traction applied.  Her right operative hip was prepped and draped with DuraPrep and sterile drapes.  A time-out was called and she was identified as correct patient and correct right hip.  We then made incision just inferior and posterior to the anterior superior iliac spine and carried this obliquely down the leg.  We dissected down to tensor fascia lata muscle and tensor fascia was then divided longitudinally to proceed with a direct anterior approach to the hip. We identified and cauterized circumflex vessels and then identified the hip capsule.  I opened up the hip capsule finding a large hematoma consistent with an acute femoral neck fracture.  We could see the femoral neck fracture as well.  We then placed Cobra retractors around the remnants of the lateral medial femoral neck and then made our femoral neck  cut with an oscillating saw and completed this with an osteotome.  I placed a corkscrew guide in the femoral head and removed the femoral head in its entirety.  We then cleaned the acetabulum of remnants of the acetabular labrum and other debris.  I placed a bent Hohmann over the medial acetabular rim and then began reaming from a 43 reamer in stepwise increments up to a size 50 with all reamers under direct visualization, the last reamer under direct fluoroscopy, so we could obtain our depth of reaming, our inclination and anteversion.  We then placed the real DePuy Sector Gription acetabular component size 50 and a 32 +0 polyethylene liner for that size acetabular  component. Attention was then turned to the femur.  With the leg externally rotated to 120 degrees extended and adducted, we were able to place a Mueller retractor medially and a Hohmann retractor behind the greater trochanter.  We released the joint capsule and used a box cutting osteotome to enter the femoral canal and rongeur to lateralize.  We then began broaching from a size 8 broach using the Corail broaching system going up to a size 12.  With a size 12 in place, we trialed a standard offset femoral neck and a 32 +1 hip ball.  We brought the leg back over and up with traction and internal rotation reducing the pelvis, and we were pleased with leg length, offset, stability, and range of motion. We then dislocated the hip and removed the trial components.  We were able to place the real Corail femoral component with standard offset, size 12 and the real 32 +1 metal hip ball reduced this in the acetabulum and again it was stable.  We then irrigated the soft tissue with normal saline solution using pulsatile lavage.  We closed the joint capsule with interrupted #1 Ethibond suture, followed by running #1 Vicryl in tensor fascia, 0 Vicryl in the deep tissue, 2-0 Vicryl in the subcutaneous tissue, interrupted staples on the skin.  Xeroform and Aquacel dressing were applied.  She was taken off the Hana table, awakened, extubated, and taken to the recovery room in stable condition. All final counts were correct.  There were no complications noted.  Of note, Erskine Emery, PA-C, assisted in entire case.  His assistance was crucial for facilitating all aspects of this case.     Lind Guest. Ninfa Linden, M.D.     CYB/MEDQ  D:  07/23/2017  T:  07/23/2017  Job:  [id number]

## 2017-07-23 NOTE — Progress Notes (Signed)
PROGRESS NOTE    Yolanda Huffman   DUK:[id number]  DOB: 1940/09/11  DOA: 07/22/2017 PCP: Eulas Post, MD   Brief Narrative:  Yolanda Huffman is a 77 y.o. female with medical history significant for anemia, arthritis, atrial fibrillation on warfarin, coronary artery disease with PTCA in 2009, left breast cancer on anastrozole,  diastolic and systolic congestive heart failure (with echocardiogram in March 2012 showed a mild left ventricular hypertrophy with an EF of 45-50% with normal diastolic heart function), CKD 4,  diabetes mellitus type 2, hypertension, hyperlipidemia, and permanent pacemaker who presents via EMS after a mechanical fall outside her home. She is admitted for a right femoral neck fracture.    Subjective: No complaints other than pain.    Assessment & Plan:   Principal Problem:   Closed displaced fracture of right femoral neck - management per ortho  Active Problems: A-fib on chronic anticoagulation - Coumadin held/reversed for surgery with vit K - cont Toprol for rate control- HR currently controlled  Anemia due to acute blood loss as a result of a fracture - Hb 13.1 >> 11.9 today - cont to follow  CKD 4 - stable- follow   Chronic systolic and diastolic CHF - EF 37-62% - currently NPO and not receiving any fluids- cut back on Lasix and Zaroxolyn in peri-op period to prevent AKI    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  - stable- cont Lantus- add SSI novolog    Hyperlipidemia - cont Lipitor    Essential hypertension -Toprol XL  Resumed- diuretic dose lowered    Coronary atherosclerosis - appears that she is on Coumadin but not on aspirin at home- cont statin   Cardiac pacemaker in situ    DVT prophylaxis: per ortho after surgery Code Status: Full code Family Communication:  Disposition Plan: follow on telemetry Consultants:   ortho Procedures:    Antimicrobials:  Anti-infectives (From admission, onward)   Start     Dose/Rate  Route Frequency Ordered Stop   07/23/17 0600  ceFAZolin (ANCEF) IVPB 2g/100 mL premix     2 g 200 mL/hr over 30 Minutes Intravenous On call to O.R. 07/23/17 0028 07/24/17 0559       Objective: Vitals:   07/22/17 1530 07/22/17 1624 07/22/17 1943 07/23/17 0535  BP: (!) 165/64 128/87 (!) 135/58 (!) 128/54  Pulse: 74 81 70 70  Resp: 20 18 16 17   Temp:  97.9 F (36.6 C) 98.2 F (36.8 C) 98.2 F (36.8 C)  TempSrc:  Oral Oral Oral  SpO2: 100% 98% 97% 96%  Weight:      Height:        Intake/Output Summary (Last 24 hours) at 07/23/2017 0818 Last data filed at 07/23/2017 0158 Gross per 24 hour  Intake 366.25 ml  Output 1100 ml  Net -733.75 ml   Filed Weights   07/22/17 0944  Weight: 67.1 kg (148 lb)    Examination: General exam: Appears comfortable  HEENT: PERRLA, oral mucosa moist, no sclera icterus or thrush Respiratory system: Clear to auscultation. Respiratory effort normal. Cardiovascular system: S1 & S2 heard, RRR.  No murmurs  Gastrointestinal system: Abdomen soft, non-tender, nondistended. Normal bowel sound. No organomegaly Central nervous system: Alert and oriented. No focal neurological deficits. Extremities: No cyanosis, clubbing or edema Skin: No rashes or ulcers Psychiatry:  Mood & affect appropriate.     Data Reviewed: I have personally reviewed following labs and imaging studies  CBC: Recent Labs  Lab 07/22/17  1034 07/23/17 0722  WBC 9.8 8.8  HGB 13.1 11.9*  HCT 37.7 34.5*  MCV 88.5 88.5  PLT 199 867   Basic Metabolic Panel: Recent Labs  Lab 07/22/17 1034  NA 133*  K 3.1*  CL 98*  CO2 20*  GLUCOSE 112*  BUN 53*  CREATININE 2.52*  CALCIUM 8.5*   GFR: Estimated Creatinine Clearance: 17.4 mL/min (A) (by C-G formula based on SCr of 2.52 mg/dL (H)). Liver Function Tests: No results for input(s): AST, ALT, ALKPHOS, BILITOT, PROT, ALBUMIN in the last 168 hours. No results for input(s): LIPASE, AMYLASE in the last 168 hours. No results for  input(s): AMMONIA in the last 168 hours. Coagulation Profile: Recent Labs  Lab 07/22/17 1203  INR 2.20   Cardiac Enzymes: No results for input(s): CKTOTAL, CKMB, CKMBINDEX, TROPONINI in the last 168 hours. BNP (last 3 results) No results for input(s): PROBNP in the last 8760 hours. HbA1C: No results for input(s): HGBA1C in the last 72 hours. CBG: Recent Labs  Lab 07/22/17 1656 07/22/17 2112 07/23/17 0604  GLUCAP 131* 264* 93   Lipid Profile: No results for input(s): CHOL, HDL, LDLCALC, TRIG, CHOLHDL, LDLDIRECT in the last 72 hours. Thyroid Function Tests: No results for input(s): TSH, T4TOTAL, FREET4, T3FREE, THYROIDAB in the last 72 hours. Anemia Panel: No results for input(s): VITAMINB12, FOLATE, FERRITIN, TIBC, IRON, RETICCTPCT in the last 72 hours. Urine analysis:    Component Value Date/Time   COLORURINE LT. YELLOW 07/01/2011 1203   APPEARANCEUR CLEAR 07/01/2011 1203   LABSPEC 1.010 07/01/2011 1203   PHURINE 6.0 07/01/2011 1203   GLUCOSEU NEGATIVE 07/01/2011 1203   HGBUR TRACE-LYSED 07/01/2011 1203   BILIRUBINUR neg 05/02/2013 1215   KETONESUR NEGATIVE 07/01/2011 1203   PROTEINUR 1 05/02/2013 1215   PROTEINUR NEGATIVE 09/15/2010 0449   UROBILINOGEN 0.2 05/02/2013 1215   UROBILINOGEN 0.2 07/01/2011 1203   NITRITE neg 05/02/2013 1215   NITRITE NEGATIVE 07/01/2011 1203   LEUKOCYTESUR moderate (2+) 05/02/2013 1215   Sepsis Labs: @LABRCNTIP (procalcitonin:4,lacticidven:4) ) Recent Results (from the past 240 hour(s))  Surgical pcr screen     Status: None   Collection Time: 07/22/17  6:35 PM  Result Value Ref Range Status   MRSA, PCR NEGATIVE NEGATIVE Final   Staphylococcus aureus NEGATIVE NEGATIVE Final    Comment: (NOTE) The Xpert SA Assay (FDA approved for NASAL specimens in patients 52 years of age and older), is one component of a comprehensive surveillance program. It is not intended to diagnose infection nor to guide or monitor treatment.           Radiology Studies: Dg Chest 1 View  Result Date: 07/22/2017 CLINICAL DATA:  Fall with right leg deformity. EXAM: CHEST 1 VIEW COMPARISON:  06/08/2011. FINDINGS: The cardio pericardial silhouette is enlarged. Interstitial markings are diffusely coarsened with chronic features. Bones are diffusely demineralized. Left permanent pacemaker again noted. Telemetry leads overlie the chest. IMPRESSION: Cardiomegaly without acute cardiopulmonary findings. Electronically Signed   By: Misty Stanley M.D.   On: 07/22/2017 11:12   Dg Knee 1-2 Views Right  Result Date: 07/22/2017 CLINICAL DATA:  Known proximal femoral fracture EXAM: RIGHT KNEE - 2 VIEW COMPARISON:  None. FINDINGS: No evidence of fracture, dislocation, or joint effusion. No evidence of arthropathy or other focal bone abnormality. Soft tissues are unremarkable. IMPRESSION: No acute abnormality noted. Electronically Signed   By: Inez Catalina M.D.   On: 07/22/2017 11:13   Dg Hip Unilat W Or Wo Pelvis 2-3 Views Right  Result Date:  07/22/2017 CLINICAL DATA:  Recent fall with right leg pain, initial encounter EXAM: DG HIP (WITH OR WITHOUT PELVIS) 2-3V RIGHT COMPARISON:  None. FINDINGS: Pelvic ring is intact. Calcified uterine fibroid is noted. There is a right femoral neck fracture with impaction at the fracture site. No gross soft tissue abnormality is seen. No other fractures are noted. IMPRESSION: Right femoral neck fracture Electronically Signed   By: Inez Catalina M.D.   On: 07/22/2017 11:13      Scheduled Meds: . anastrozole  1 mg Oral Daily  . atorvastatin  40 mg Oral Daily  . [START ON 07/24/2017] calcitRIOL  0.25 mcg Oral Q M,W,F  . docusate sodium  100 mg Oral BID  . furosemide  40 mg Oral q1800  . furosemide  80 mg Oral Daily  . insulin detemir  12 Units Subcutaneous QHS  . [START ON 07/24/2017] metolazone  2.5 mg Oral Q M,W,F  . metoprolol succinate  50 mg Oral Daily  . multivitamin with minerals  1 tablet Oral Daily  .  omega-3 acid ethyl esters  2 g Oral BID  . pantoprazole  40 mg Oral BID  . potassium chloride SA  20 mEq Oral BID  . povidone-iodine  2 application Topical Once   Continuous Infusions: . sodium chloride 75 mL/hr at 07/22/17 1627  .  ceFAZolin (ANCEF) IV       LOS: 1 day    Time spent in minutes: 35    Debbe Odea, MD [facility] Pager: [email or web address] Password [facility] 07/23/2017, 8:18 AM

## 2017-07-23 NOTE — Anesthesia Procedure Notes (Signed)
Procedure Name: Intubation Date/Time: 07/23/2017 11:32 AM Performed by: Clearnce Sorrel, CRNA Pre-anesthesia Checklist: Patient identified, Emergency Drugs available, Suction available, Patient being monitored and Timeout performed Patient Re-evaluated:Patient Re-evaluated prior to induction Oxygen Delivery Method: Circle system utilized Preoxygenation: Pre-oxygenation with 100% oxygen Induction Type: IV induction Ventilation: Mask ventilation without difficulty and Oral airway inserted - appropriate to patient size Laryngoscope Size: Mac and 3 Grade View: Grade I Tube type: Oral Tube size: 7.0 mm Number of attempts: 1 Airway Equipment and Method: Stylet Placement Confirmation: ETT inserted through vocal cords under direct vision,  positive ETCO2 and breath sounds checked- equal and bilateral Secured at: 21 cm Tube secured with: Tape Dental Injury: Teeth and Oropharynx as per pre-operative assessment

## 2017-07-23 NOTE — Progress Notes (Signed)
ANTICOAGULATION CONSULT NOTE - Initial Consult  Pharmacy Consult for Warfarin Indication: atrial fibrillation and VTE px s/p R-THA  Allergies  Allergen Reactions  . Amlodipine Besylate Swelling    edema    Patient Measurements: Height: 5' 3.5" (161.3 cm) Weight: 148 lb (67.1 kg) IBW/kg (Calculated) : 53.55 Heparin Dosing Weight:   Vital Signs: Temp: 97.9 F (36.6 C) (01/27 2028) Temp Source: Oral (01/27 2028) BP: 122/55 (01/27 2028) Pulse Rate: 71 (01/27 2028)  Labs: Recent Labs    07/22/17 1034 07/22/17 1203 07/23/17 0722  HGB 13.1  --  11.9*  HCT 37.7  --  34.5*  PLT 199  --  176  APTT  --  39* 31  LABPROT  --  24.3* 17.9*  INR  --  2.20 1.49  CREATININE 2.52*  --  2.36*    Estimated Creatinine Clearance: 18.6 mL/min (A) (by C-G formula based on SCr of 2.36 mg/dL (H)).   Medical History: Past Medical History:  Diagnosis Date  . Anemia   . Anemia   . Arthritis    gout  . Atrial fibrillation ([place])    prior Sotalol - d/c'd 2/2 increased creatinine; rate control strategy  . CAD (coronary artery disease)    s/p PCI in [place] in 1/09  . Cancer [facility])    left breast  . Chronic combined systolic and diastolic heart failure (H[facility])    Echocardiogram 3/12: Mild LVH, EF 12-24%, normal diastolic function, mild AI, mild MR, PASP 44, normal wall motion  . CKD (chronic kidney disease)    sees Dr. Burman Foster - Stage 3 ?  Marland Kitchen Diabetes mellitus   . Hyperlipemia   . Hypertension   . MI, old    2009  . Osteoporosis   . Pacemaker   . Pleural effusion   . Pneumonia   . PUD (peptic ulcer disease) 10/2010   duodenal ulcer    Assessment: 79 YOF who presented on 1/26 s/p fall with R-hip fracture needing repair - now s/p R-THA earlier today. The patient was on warfarin PTA for hx Afib . Admit INR 2.2 - reversed with Vit K 10 mg IV x 1 on 1/26 for surgery. Pharmacy consulted to resume dosing post-op.   INR is SUBtherapeutic today (1.49, goal of 2-3). Hgb/Hct drop, plt wnl.  Will monitor. Likely will be fighting against the effects of Vit K to get INR back up to goal.   PTA dose: 5 mg daily EXCEPT 2.5 mg on MWF   Goal of Therapy:  INR 2-3 Monitor platelets by anticoagulation protocol: Yes   Plan:  1. Warfarin 7.5 mg x 1 dose this evening 2. Daily PT/INR 3. Will continue to monitor for any signs/symptoms of bleeding and will follow up with PT/INR in the a.m.   Thank you for allowing pharmacy to be a part of this patient's care.  Alycia Rossetti, PharmD, BCPS Clinical Pharmacist Pager: [phone] 07/23/2017 8:45 PM

## 2017-07-23 NOTE — Progress Notes (Signed)
Patient ID: Yolanda Huffman, female   DOB: 04-17-1941, 77 y.o.   MRN: [id number] Unfortunately her 5 AM lab today was not drawn until after 7:30 since it was not indicated as a stat lab instead of routine.  We are having to delay her surgery this morning until we know what her INR is.  We will still have her n.p.o. until we get that result.  If her INR is below 2, I am comfortable with proceeding to surgery today for a right total hip arthroplasty.  If it is not at an acceptable level for surgery, we will need to delay until the end of the day tomorrow.  I have communicated that with the patient.

## 2017-07-23 NOTE — Anesthesia Postprocedure Evaluation (Signed)
Anesthesia Post Note  Patient: Yolanda Huffman  Procedure(s) Performed: TOTAL HIP ARTHROPLASTY ANTERIOR APPROACH (Right Leg Upper)     Patient location during evaluation: PACU Anesthesia Type: General Level of consciousness: awake and alert Pain management: pain level controlled Vital Signs Assessment: post-procedure vital signs reviewed and stable Respiratory status: spontaneous breathing, nonlabored ventilation, respiratory function stable and patient connected to nasal cannula oxygen Cardiovascular status: blood pressure returned to baseline and stable Postop Assessment: no apparent nausea or vomiting Anesthetic complications: no    Last Vitals:  Vitals:   07/23/17 1400 07/23/17 1417  BP:  (!) 150/67  Pulse: 70   Resp: 19 16  Temp:  36.6 C  SpO2: 93% 98%    Last Pain:  Vitals:   07/23/17 1417  TempSrc: Oral  PainSc:                  [name]

## 2017-07-23 NOTE — Anesthesia Procedure Notes (Addendum)
Arterial Line Insertion Start/End1/27/2019 10:50 AM, 07/23/2017 10:55 AM Performed by: Clearnce Sorrel, CRNA, CRNA  Patient location: Pre-op. Preanesthetic checklist: patient identified, IV checked, site marked, risks and benefits discussed, surgical consent, monitors and equipment checked, pre-op evaluation, timeout performed and anesthesia consent Lidocaine 1% used for infiltration Left, radial was placed Catheter size: 20 Fr Hand hygiene performed  and maximum sterile barriers used   Attempts: 1 Procedure performed without using ultrasound guided technique. Following insertion, dressing applied. Post procedure assessment: normal and unchanged

## 2017-07-23 NOTE — Transfer of Care (Signed)
Immediate Anesthesia Transfer of Care Note  Patient: Yolanda Huffman  Procedure(s) Performed: TOTAL HIP ARTHROPLASTY ANTERIOR APPROACH (Right Leg Upper)  Patient Location: PACU  Anesthesia Type:General  Level of Consciousness: awake, alert  and oriented  Airway & Oxygen Therapy: Patient Spontanous Breathing and Patient connected to nasal cannula oxygen  Post-op Assessment: Report given to RN and Post -op Vital signs reviewed and stable  Post vital signs: Reviewed and stable  Last Vitals:  Vitals:   07/23/17 0535 07/23/17 1306  BP: (!) 128/54 (P) 136/70  Pulse: 70 76  Resp: 17 (P) 15  Temp: 36.8 C (!) (P) 36.3 C  SpO2: 96%     Last Pain:  Vitals:   07/23/17 1038  TempSrc:   PainSc: 5       Patients Stated Pain Goal: 0 (00/17/49 4496)  Complications: No apparent anesthesia complications

## 2017-07-24 ENCOUNTER — Encounter (HOSPITAL_COMMUNITY): Payer: Self-pay | Admitting: Orthopaedic Surgery

## 2017-07-24 LAB — BASIC METABOLIC PANEL
Anion gap: 12 (ref 5–15)
BUN: 51 mg/dL — ABNORMAL HIGH (ref 6–20)
CHLORIDE: 102 mmol/L (ref 101–111)
CO2: 21 mmol/L — ABNORMAL LOW (ref 22–32)
CREATININE: 2.4 mg/dL — AB (ref 0.44–1.00)
Calcium: 7.8 mg/dL — ABNORMAL LOW (ref 8.9–10.3)
GFR, EST AFRICAN AMERICAN: 21 mL/min — AB (ref >60)
GFR, EST NON AFRICAN AMERICAN: 18 mL/min — AB (ref >60)
Glucose, Bld: 122 mg/dL — ABNORMAL HIGH (ref 65–99)
POTASSIUM: 3.1 mmol/L — AB (ref 3.5–5.1)
SODIUM: 135 mmol/L (ref 135–145)

## 2017-07-24 LAB — GLUCOSE, CAPILLARY
GLUCOSE-CAPILLARY: 110 mg/dL — AB (ref 65–99)
Glucose-Capillary: 116 mg/dL — ABNORMAL HIGH (ref 65–99)
Glucose-Capillary: 93 mg/dL (ref 65–99)
Glucose-Capillary: 196 mg/dL — ABNORMAL HIGH (ref 65–99)

## 2017-07-24 LAB — CBC
HCT: 33 % — ABNORMAL LOW (ref 36.0–46.0)
Hemoglobin: 11 g/dL — ABNORMAL LOW (ref 12.0–15.0)
MCH: 29.7 pg (ref 26.0–34.0)
MCHC: 33.3 g/dL (ref 30.0–36.0)
MCV: 89.2 fL (ref 78.0–100.0)
PLATELETS: 192 10*3/uL (ref 150–400)
RBC: 3.7 MIL/uL — AB (ref 3.87–5.11)
RDW: 13.9 % (ref 11.5–15.5)
WBC: 9.3 10*3/uL (ref 4.0–10.5)

## 2017-07-24 LAB — PROTIME-INR
INR: 1.34
Prothrombin Time: 16.5 seconds — ABNORMAL HIGH (ref 11.4–15.2)

## 2017-07-24 MED ORDER — WARFARIN SODIUM 7.5 MG PO TABS
7.5000 mg | ORAL_TABLET | Freq: Once | ORAL | Status: AC
  Administered 2017-07-24: 7.5 mg via ORAL
  Filled 2017-07-24: qty 1

## 2017-07-24 MED ORDER — ENOXAPARIN SODIUM 80 MG/0.8ML ~~LOC~~ SOLN
1.0000 mg/kg | SUBCUTANEOUS | Status: DC
  Administered 2017-07-24 – 2017-07-25 (×2): 65 mg via SUBCUTANEOUS
  Filled 2017-07-24 (×2): qty 0.8

## 2017-07-24 MED ORDER — GLUCERNA SHAKE PO LIQD
237.0000 mL | Freq: Two times a day (BID) | ORAL | Status: DC
  Administered 2017-07-24 – 2017-07-25 (×2): 237 mL via ORAL

## 2017-07-24 MED ORDER — HEPARIN SODIUM (PORCINE) 5000 UNIT/ML IJ SOLN: 5000.0000 [IU] | Freq: Three times a day (TID) | INTRAMUSCULAR | Status: DC

## 2017-07-24 MED ORDER — POTASSIUM CHLORIDE CRYS ER 20 MEQ PO TBCR
40.0000 meq | EXTENDED_RELEASE_TABLET | Freq: Once | ORAL | Status: AC
  Administered 2017-07-24: 40 meq via ORAL
  Filled 2017-07-24: qty 2

## 2017-07-24 NOTE — Evaluation (Signed)
Physical Therapy Evaluation Patient Details Name: Yolanda Huffman MRN: [id number] DOB: 1940/12/09 Today's Date: 07/24/2017   History of Present Illness  Yolanda Huffman is a 77 y.o. female with medical history significant for anemia, arthritis, atrial fibrillation on warfarin, coronary artery disease with PTCA in 2009, left breast cancer on anastrozole,  diastolic and systolic congestive heart failure (with echocardiogram in March 2012 showed a mild left ventricular hypertrophy with an EF of 45-50% with normal diastolic heart function), CKD 4,  diabetes mellitus type 2, hypertension, hyperlipidemia, and permanent pacemaker who presents via EMS after a mechanical fall outside her home. Now s/p R THA via direct Anterior approach  Clinical Impression   Patient is s/p above surgery resulting in functional limitations due to the deficits listed below (see PT Problem List). Pt lives alone, at baseline is mod independent with ADLs and functional mobility. Pt presents with increased pain, generalized weakness, and decreased functional performance. Patient will benefit from skilled PT to increase their independence and safety with mobility to allow discharge to the venue listed below.       Follow Up Recommendations SNF;Supervision/Assistance - 24 hour    Equipment Recommendations  Rolling walker with 5" wheels;3in1 (PT)    Recommendations for Other Services       Precautions / Restrictions Precautions Precautions: Fall Restrictions Weight Bearing Restrictions: Yes RLE Weight Bearing: Weight bearing as tolerated      Mobility  Bed Mobility Overal bed mobility: Needs Assistance Bed Mobility: Supine to Sit     Supine to sit: Mod assist     General bed mobility comments: Cues for technqiue and mod asssit and use of bed pad to scoot to EOB; mod handheld assist to pull to sit; bed pad used to square off hips at EOB  Transfers Overall transfer level: Needs assistance Equipment used: Rolling  walker (2 wheeled) Transfers: Sit to/from Stand Sit to Stand: Mod assist         General transfer comment: Heavy mod assist to rise and steady while Pt transitions UEs from recliner to RW; verbal cues for safe hand placement   Ambulation/Gait Ambulation/Gait assistance: Mod assist;+2 safety/equipment Ambulation Distance (Feet): 3 Feet(x2; bed to [facility] to recliner) Assistive device: Rolling walker (2 wheeled) Gait Pattern/deviations: Decreased stance time - right;Decreased step length - right;Decreased step length - left;Trunk flexed     General Gait Details: Cues for sequencing and to unweigh painful R hip by pushing down into RW  Stairs            Wheelchair Mobility    Modified Rankin (Stroke Patients Only)       Balance Overall balance assessment: Needs assistance Sitting-balance support: Feet supported Sitting balance-Leahy Scale: Fair     Standing balance support: Bilateral upper extremity supported;During functional activity;Single extremity supported Standing balance-Leahy Scale: Poor Standing balance comment: reliant on UE/forearm support while completing grooming ADLs at sink                              Pertinent Vitals/Pain Pain Assessment: Faces Faces Pain Scale: Hurts little more Pain Location: R hip with movement  Pain Descriptors / Indicators: Aching;Grimacing Pain Intervention(s): Monitored during session    Home Living Family/patient expects to be discharged to:: Skilled nursing facility Living Arrangements: Alone             Home Equipment: Cane - quad;Grab bars - tub/shower      Prior Function Level of  Independence: Independent with assistive device(s)         Comments: using quad cane for ambulation      Hand Dominance   Dominant Hand: Right    Extremity/Trunk Assessment   Upper Extremity Assessment Upper Extremity Assessment: Defer to OT evaluation LUE Coordination: decreased fine motor    Lower Extremity  Assessment Lower Extremity Assessment: RLE deficits/detail RLE Deficits / Details: Grossly decr AROM and strength, limited by pain postop    Cervical / Trunk Assessment Cervical / Trunk Assessment: Kyphotic  Communication   Communication: No difficulties  Cognition Arousal/Alertness: Awake/alert Behavior During Therapy: WFL for tasks assessed/performed Overall Cognitive Status: Within Functional Limits for tasks assessed                                 General Comments: Pt very chatty, requires redirection to task at hand      General Comments      Exercises     Assessment/Plan    PT Assessment Patient needs continued PT services  PT Problem List Decreased strength;Decreased range of motion;Decreased activity tolerance;Decreased balance;Decreased mobility;Decreased coordination;Decreased knowledge of use of DME;Decreased safety awareness;Decreased knowledge of precautions;Pain       PT Treatment Interventions DME instruction;Gait training;Functional mobility training;Therapeutic activities;Therapeutic exercise;Balance training;Patient/family education    PT Goals (Current goals can be found in the Care Plan section)  Acute Rehab PT Goals Patient Stated Goal: regain independence  PT Goal Formulation: With patient Time For Goal Achievement: 08/07/17 Potential to Achieve Goals: Good    Frequency Min 4X/week   Barriers to discharge        Co-evaluation               AM-PAC PT "6 Clicks" Daily Activity  Outcome Measure Difficulty turning over in bed (including adjusting bedclothes, sheets and blankets)?: Unable Difficulty moving from lying on back to sitting on the side of the bed? : Unable Difficulty sitting down on and standing up from a chair with arms (e.g., wheelchair, bedside commode, etc,.)?: Unable Help needed moving to and from a bed to chair (including a wheelchair)?: A Lot Help needed walking in hospital room?: A Lot Help needed climbing  3-5 steps with a railing? : Total 6 Click Score: 8    End of Session Equipment Utilized During Treatment: Gait belt Activity Tolerance: Patient tolerated treatment well Patient left: in chair;with call bell/phone within reach Nurse Communication: Mobility status PT Visit Diagnosis: Unsteadiness on feet (R26.81);Other abnormalities of gait and mobility (R26.89);Pain Pain - Right/Left: Right Pain - part of body: Hip    Time: 4034-7425 PT Time Calculation (min) (ACUTE ONLY): 30 min   Charges:   PT Evaluation $PT Eval Moderate Complexity: 1 Mod PT Treatments $Therapeutic Activity: 8-22 mins   PT G Codes:        Roney Marion, PT  Acute Rehabilitation Services Pager [phone] Office [phone]   Colletta [place] 07/24/2017, 4:04 PM

## 2017-07-24 NOTE — Progress Notes (Signed)
Subjective: 1 Day Post-Op Procedure(s) (LRB): TOTAL HIP ARTHROPLASTY ANTERIOR APPROACH (Right) Patient reports pain as moderate.    Objective: Vital signs in last 24 hours: Temp:  [97.3 F (36.3 C)-98 F (36.7 C)] 98 F (36.7 C) (01/28 0535) Pulse Rate:  [68-76] 68 (01/28 0535) Resp:  [10-20] 17 (01/28 0535) BP: (122-161)/(55-75) 135/59 (01/28 0535) SpO2:  [93 %-100 %] 98 % (01/28 0535) Arterial Line BP: (166-173)/(64-68) 173/68 (01/27 1315)  Intake/Output from previous day: 01/27 0701 - 01/28 0700 In: 700 [I.V.:600] Out: 1725 [Urine:1550; Blood:175] Intake/Output this shift: No intake/output data recorded.  Recent Labs    07/22/17 1034 07/23/17 0722 07/24/17 0610  HGB 13.1 11.9* 11.0*   Recent Labs    07/23/17 0722 07/24/17 0610  WBC 8.8 9.3  RBC 3.90 3.70*  HCT 34.5* 33.0*  PLT 176 192   Recent Labs    07/23/17 0722 07/24/17 0610  NA 134* 135  K 3.6 3.1*  CL 101 102  CO2 18* 21*  BUN 51* 51*  CREATININE 2.36* 2.40*  GLUCOSE 85 122*  CALCIUM 8.1* 7.8*   Recent Labs    07/23/17 0722 07/24/17 0610  INR 1.49 1.34    Sensation intact distally Intact pulses distally Dorsiflexion/Plantar flexion intact Incision: dressing C/D/I  Assessment/Plan: 1 Day Post-Op Procedure(s) (LRB): TOTAL HIP ARTHROPLASTY ANTERIOR APPROACH (Right) Up with therapy - Full weight bearing as tolerated right hip; no precautions Can resume coumadin  Mcarthur Rossetti 07/24/2017, 7:47 AM

## 2017-07-24 NOTE — Progress Notes (Signed)
PROGRESS NOTE    Yolanda Huffman   XKG:[id number]  DOB: 10-10-1940  DOA: 07/22/2017 PCP: Eulas Post, MD   Brief Narrative:  Yolanda Huffman is a 77 y.o. female with medical history significant for anemia, arthritis, atrial fibrillation on warfarin, coronary artery disease with PTCA in 2009, left breast cancer on anastrozole,  diastolic and systolic congestive heart failure (with echocardiogram in March 2012 showed a mild left ventricular hypertrophy with an EF of 45-50% with normal diastolic heart function), CKD 4,  diabetes mellitus type 2, hypertension, hyperlipidemia, and permanent pacemaker who presents via EMS after a mechanical fall outside her home. She is admitted for a right femoral neck fracture.    Subjective: No complaints today. Have discussed starting Lovenox until INR becomes therapeutic.     Assessment & Plan:   Principal Problem:   Closed displaced fracture of right femoral neck - management per ortho  Active Problems: A-fib on chronic anticoagulation - Coumadin held/reversed for surgery with vit K - cont Toprol for rate control- HR currently controlled - Coumadin resumed but due to Vit K being given, it may be a number of days before she is therapeutic again and this will also put her at a high risk to develop a post op DVT   - start Lovenox today and continue until INR therapeutic  Anemia due to acute blood loss as a result of a fracture - Hb 13.1 >> 11.0 today - cont to follow  Hypokalemia - replace  CKD 4 - stable- follow   Chronic systolic and diastolic CHF - EF 70-26% - have cut back on Lasix and Zaroxolyn in peri-op period to prevent AKI - following daily weights    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  - stable- cont Lantus- add SSI novolog    Hyperlipidemia - cont Lipitor    Essential hypertension -Toprol XL resumed- diuretic dose lowered    Coronary atherosclerosis - appears that she is on Coumadin but not on aspirin at home-  cont statin   Cardiac pacemaker in situ    DVT prophylaxis: per ortho after surgery Code Status: Full code Family Communication:  Disposition Plan: follow on telemetry Consultants:   ortho Procedures:    Antimicrobials:  Anti-infectives (From admission, onward)   Start     Dose/Rate Route Frequency Ordered Stop   07/24/17 0000  ceFAZolin (ANCEF) IVPB 2g/100 mL premix     2 g 200 mL/hr over 30 Minutes Intravenous Every 12 hours 07/23/17 2009 07/24/17 0133   07/23/17 0600  ceFAZolin (ANCEF) IVPB 2g/100 mL premix  Status:  Discontinued     2 g 200 mL/hr over 30 Minutes Intravenous On call to O.R. 07/23/17 0028 07/23/17 1411       Objective: Vitals:   07/23/17 1417 07/23/17 2028 07/24/17 0042 07/24/17 0535  BP: (!) 150/67 (!) 122/55 138/60 (!) 135/59  Pulse:  71 71 68  Resp: 16 15 17 17   Temp: 97.9 F (36.6 C) 97.9 F (36.6 C) 97.6 F (36.4 C) 98 F (36.7 C)  TempSrc: Oral Oral Oral Oral  SpO2: 98% 98% 97% 98%  Weight:      Height:        Intake/Output Summary (Last 24 hours) at 07/24/2017 1109 Last data filed at 07/24/2017 0900 Gross per 24 hour  Intake 910 ml  Output 1725 ml  Net -815 ml   Filed Weights   07/22/17 0944  Weight: 67.1 kg (148 lb)    Examination:  General exam: Appears comfortable  HEENT: PERRLA, oral mucosa moist, no sclera icterus or thrush Respiratory system: Clear to auscultation. Respiratory effort normal. Cardiovascular system: S1 & S2 heard, RRR.  No murmurs  Gastrointestinal system: Abdomen soft, non-tender, nondistended. Normal bowel sound. No organomegaly Central nervous system: Alert and oriented. No focal neurological deficits. Extremities: No cyanosis, clubbing or edema Skin: No rashes or ulcers Psychiatry:  Mood & affect appropriate.     Data Reviewed: I have personally reviewed following labs and imaging studies  CBC: Recent Labs  Lab 07/22/17 1034 07/23/17 0722 07/24/17 0610  WBC 9.8 8.8 9.3  HGB 13.1 11.9* 11.0*    HCT 37.7 34.5* 33.0*  MCV 88.5 88.5 89.2  PLT 199 176 202   Basic Metabolic Panel: Recent Labs  Lab 07/22/17 1034 07/23/17 0722 07/24/17 0610  NA 133* 134* 135  K 3.1* 3.6 3.1*  CL 98* 101 102  CO2 20* 18* 21*  GLUCOSE 112* 85 122*  BUN 53* 51* 51*  CREATININE 2.52* 2.36* 2.40*  CALCIUM 8.5* 8.1* 7.8*   GFR: Estimated Creatinine Clearance: 18.3 mL/min (A) (by C-G formula based on SCr of 2.4 mg/dL (H)). Liver Function Tests: No results for input(s): AST, ALT, ALKPHOS, BILITOT, PROT, ALBUMIN in the last 168 hours. No results for input(s): LIPASE, AMYLASE in the last 168 hours. No results for input(s): AMMONIA in the last 168 hours. Coagulation Profile: Recent Labs  Lab 07/22/17 1203 07/23/17 0722 07/24/17 0610  INR 2.20 1.49 1.34   Cardiac Enzymes: No results for input(s): CKTOTAL, CKMB, CKMBINDEX, TROPONINI in the last 168 hours. BNP (last 3 results) No results for input(s): PROBNP in the last 8760 hours. HbA1C: No results for input(s): HGBA1C in the last 72 hours. CBG: Recent Labs  Lab 07/23/17 1027 07/23/17 1310 07/23/17 1632 07/23/17 2118 07/24/17 0622  GLUCAP 88 80 120* 327* 116*   Lipid Profile: No results for input(s): CHOL, HDL, LDLCALC, TRIG, CHOLHDL, LDLDIRECT in the last 72 hours. Thyroid Function Tests: No results for input(s): TSH, T4TOTAL, FREET4, T3FREE, THYROIDAB in the last 72 hours. Anemia Panel: No results for input(s): VITAMINB12, FOLATE, FERRITIN, TIBC, IRON, RETICCTPCT in the last 72 hours. Urine analysis:    Component Value Date/Time   COLORURINE LT. YELLOW 07/01/2011 1203   APPEARANCEUR CLEAR 07/01/2011 1203   LABSPEC 1.010 07/01/2011 1203   PHURINE 6.0 07/01/2011 1203   GLUCOSEU NEGATIVE 07/01/2011 1203   HGBUR TRACE-LYSED 07/01/2011 1203   BILIRUBINUR neg 05/02/2013 1215   KETONESUR NEGATIVE 07/01/2011 1203   PROTEINUR 1 05/02/2013 1215   PROTEINUR NEGATIVE 09/15/2010 0449   UROBILINOGEN 0.2 05/02/2013 1215   UROBILINOGEN  0.2 07/01/2011 1203   NITRITE neg 05/02/2013 1215   NITRITE NEGATIVE 07/01/2011 1203   LEUKOCYTESUR moderate (2+) 05/02/2013 1215   Sepsis Labs: @LABRCNTIP (procalcitonin:4,lacticidven:4) ) Recent Results (from the past 240 hour(s))  Surgical pcr screen     Status: None   Collection Time: 07/22/17  6:35 PM  Result Value Ref Range Status   MRSA, PCR NEGATIVE NEGATIVE Final   Staphylococcus aureus NEGATIVE NEGATIVE Final    Comment: (NOTE) The Xpert SA Assay (FDA approved for NASAL specimens in patients 35 years of age and older), is one component of a comprehensive surveillance program. It is not intended to diagnose infection nor to guide or monitor treatment.          Radiology Studies: Dg Chest 1 View  Result Date: 07/22/2017 CLINICAL DATA:  Fall with right leg deformity. EXAM: CHEST 1 VIEW COMPARISON:  06/08/2011. FINDINGS: The cardio pericardial silhouette is enlarged. Interstitial markings are diffusely coarsened with chronic features. Bones are diffusely demineralized. Left permanent pacemaker again noted. Telemetry leads overlie the chest. IMPRESSION: Cardiomegaly without acute cardiopulmonary findings. Electronically Signed   By: Misty Stanley M.D.   On: 07/22/2017 11:12   Dg Knee 1-2 Views Right  Result Date: 07/22/2017 CLINICAL DATA:  Known proximal femoral fracture EXAM: RIGHT KNEE - 2 VIEW COMPARISON:  None. FINDINGS: No evidence of fracture, dislocation, or joint effusion. No evidence of arthropathy or other focal bone abnormality. Soft tissues are unremarkable. IMPRESSION: No acute abnormality noted. Electronically Signed   By: Inez Catalina M.D.   On: 07/22/2017 11:13   Pelvis Portable  Result Date: 07/23/2017 CLINICAL DATA:  Right hip replacement EXAM: DG C-ARM 61-120 MIN; PORTABLE PELVIS 1-2 VIEWS; OPERATIVE RIGHT HIP WITH PELVIS COMPARISON:  07/22/2017 FINDINGS: 3 intraoperative spot fluoro film show the patient to undergo right hip replacement. No evidence for  immediate hardware complications. Postoperative AP portable supine view of the lower pelvis shows right hip arthroplasty. No evidence for immediate hardware complications. IMPRESSION: Right hip total arthroplasty without evidence for complicating features. Electronically Signed   By: Misty Stanley M.D.   On: 07/23/2017 14:02   Dg C-arm 1-60 Min  Result Date: 07/23/2017 CLINICAL DATA:  Right hip replacement EXAM: DG C-ARM 61-120 MIN; PORTABLE PELVIS 1-2 VIEWS; OPERATIVE RIGHT HIP WITH PELVIS COMPARISON:  07/22/2017 FINDINGS: 3 intraoperative spot fluoro film show the patient to undergo right hip replacement. No evidence for immediate hardware complications. Postoperative AP portable supine view of the lower pelvis shows right hip arthroplasty. No evidence for immediate hardware complications. IMPRESSION: Right hip total arthroplasty without evidence for complicating features. Electronically Signed   By: Misty Stanley M.D.   On: 07/23/2017 14:02   Dg Hip Operative Unilat W Or W/o Pelvis Right  Result Date: 07/23/2017 CLINICAL DATA:  Right hip replacement EXAM: DG C-ARM 61-120 MIN; PORTABLE PELVIS 1-2 VIEWS; OPERATIVE RIGHT HIP WITH PELVIS COMPARISON:  07/22/2017 FINDINGS: 3 intraoperative spot fluoro film show the patient to undergo right hip replacement. No evidence for immediate hardware complications. Postoperative AP portable supine view of the lower pelvis shows right hip arthroplasty. No evidence for immediate hardware complications. IMPRESSION: Right hip total arthroplasty without evidence for complicating features. Electronically Signed   By: Misty Stanley M.D.   On: 07/23/2017 14:02   Dg Hip Unilat W Or Wo Pelvis 2-3 Views Right  Result Date: 07/22/2017 CLINICAL DATA:  Recent fall with right leg pain, initial encounter EXAM: DG HIP (WITH OR WITHOUT PELVIS) 2-3V RIGHT COMPARISON:  None. FINDINGS: Pelvic ring is intact. Calcified uterine fibroid is noted. There is a right femoral neck fracture with  impaction at the fracture site. No gross soft tissue abnormality is seen. No other fractures are noted. IMPRESSION: Right femoral neck fracture Electronically Signed   By: Inez Catalina M.D.   On: 07/22/2017 11:13      Scheduled Meds: . anastrozole  1 mg Oral Daily  . atorvastatin  40 mg Oral Daily  . calcitRIOL  0.25 mcg Oral Q M,W,F  . docusate sodium  100 mg Oral BID  . enoxaparin (LOVENOX) injection  1 mg/kg Subcutaneous Q24H  . furosemide  40 mg Oral q1800  . insulin aspart  0-5 Units Subcutaneous QHS  . insulin aspart  0-9 Units Subcutaneous TID WC  . insulin detemir  12 Units Subcutaneous QHS  . metoprolol succinate  50 mg Oral Daily  .  multivitamin with minerals  1 tablet Oral Daily  . omega-3 acid ethyl esters  2 g Oral BID  . pantoprazole  40 mg Oral BID  . potassium chloride SA  20 mEq Oral BID  . warfarin  7.5 mg Oral ONCE-1800  . Warfarin - Pharmacist Dosing Inpatient   Does not apply q1800   Continuous Infusions: . methocarbamol (ROBAXIN)  IV       LOS: 2 days    Time spent in minutes: 35    Debbe Odea, MD [facility] Pager: [email or web address] Password [facility] 07/24/2017, 11:09 AM

## 2017-07-24 NOTE — Discharge Instructions (Signed)

## 2017-07-24 NOTE — Progress Notes (Addendum)
[place] for Warfarin / Lovenox Indication: atrial fibrillation and VTE px s/p R-THA  Allergies  Allergen Reactions  . Amlodipine Besylate Swelling    edema    Patient Measurements: Height: 5' 3.5" (161.3 cm) Weight: 148 lb (67.1 kg) IBW/kg (Calculated) : 53.55 Heparin Dosing Weight:   Vital Signs: Temp: 98 F (36.7 C) (01/28 0535) Temp Source: Oral (01/28 0535) BP: 135/59 (01/28 0535) Pulse Rate: 68 (01/28 0535)  Labs: Recent Labs    07/22/17 1034 07/22/17 1203 07/23/17 0722 07/24/17 0610  HGB 13.1  --  11.9* 11.0*  HCT 37.7  --  34.5* 33.0*  PLT 199  --  176 192  APTT  --  39* 31  --   LABPROT  --  24.3* 17.9* 16.5*  INR  --  2.20 1.49 1.34  CREATININE 2.52*  --  2.36* 2.40*    Estimated Creatinine Clearance: 18.3 mL/min (A) (by C-G formula based on SCr of 2.4 mg/dL (H)).   Medical History: Past Medical History:  Diagnosis Date  . Anemia   . Anemia   . Arthritis    gout  . Atrial fibrillation ([place])    prior Sotalol - d/c'd 2/2 increased creatinine; rate control strategy  . CAD (coronary artery disease)    s/p PCI in [place] in 1/09  . Cancer [facility])    left breast  . Chronic combined systolic and diastolic heart failure (H[facility])    Echocardiogram 3/12: Mild LVH, EF 09-60%, normal diastolic function, mild AI, mild MR, PASP 44, normal wall motion  . CKD (chronic kidney disease)    sees Dr. Burman Foster - Stage 3 ?  Marland Kitchen Diabetes mellitus   . Hyperlipemia   . Hypertension   . MI, old    2009  . Osteoporosis   . Pacemaker   . Pleural effusion   . Pneumonia   . PUD (peptic ulcer disease) 10/2010   duodenal ulcer    Assessment: 39 YOF who presented on 1/26 s/p fall with R-hip fracture needing repair - now s/p R-THA on 1/27. The patient was on warfarin PTA for hx Afib. Admit INR 2.2 - reversed with Vit K 10 mg IV x 1 on 1/26 for surgery. Pharmacy consulted to resume dosing post-op on 1/27, Lovenox started 1/28 while  subtherapeutic.  INR is SUBtherapeutic today and decreased (1.34, goal of 2-3). Hg down to 11, plt wnl, SCr 2.4 stable. Likely will be fighting against the effects of Vit K to get INR back up to goal.  PTA dose: 5 mg daily EXCEPT 2.5 mg on MWF   Goal of Therapy:  INR 2-3 Anti-Xa level 0.6-1 units/ml 4hrs after LMWH dose given Monitor platelets by anticoagulation protocol: Yes   Plan:  Lovenox 65mg  (~1mg /kg) [place] q24h while subtherapeutic Warfarin 7.5 mg x 1 dose this evening Monitor daily PT/INR, CBC at least q72h, s/sx bleeding  Elicia Lamp, PharmD, BCPS Clinical Pharmacist Clinical phone for 07/24/2017 until 3:30pm: [phone] If after 3:30pm, please call main pharmacy at: [phone] 07/24/2017 9:10 AM

## 2017-07-24 NOTE — Social Work (Signed)
CSW awaiting PT evaluation notes.  CSW will f/u with patient on SNF placement. Pt has been to [facility] for short term rehab in the past. CSW will f/u.  Elissa Hefty, LCSW Clinical Social Worker [phone]

## 2017-07-24 NOTE — Progress Notes (Signed)
Initial Nutrition Assessment  DOCUMENTATION CODES:   Not applicable  INTERVENTION:   Glucerna Shake po BID, each supplement provides 220 kcal and 10 grams of protein  NUTRITION DIAGNOSIS:   Increased nutrient needs related to post-op healing as evidenced by estimated needs.  GOAL:   Patient will meet greater than or equal to 90% of their needs  MONITOR:   PO intake, Supplement acceptance, Labs, I & O's  REASON FOR ASSESSMENT:   Consult Hip fracture protocol  ASSESSMENT:   Pt with PMH of DM, HLD, HTN, A Fib on Coumadin, CHF, CKD, and L breast cancer on Anastrozole presents after mechanical fall fall with R femoral neck fracture s/p total R hip arthroplasty (07/23/17)   Spoke with pt at bedside.  Reports a great appetite PTA, reports she monitors intake of sugar given her diabetes. Pt reports she has had decreased appetite since her surgery. Per meal completion records, pt consumed 40% of breakfast this morning.   Pt would benefit from nutritional supplement given decreased appetite and to aid in post-op healing. Pt amenable to supplemenation.   Pt reports no recent weight changes. Per chart, pt's weight has remained stable.    Labs reviewed; CBG 80-327, K 3.1, BUN 51, Creatinine 2.40 Medications reviewed; Colace, sliding scale insulin, 12 units Levimir, multivitamin, Protonix, Coumadin, potassium chloride, omega-3 acid Calcitriol   NUTRITION - FOCUSED PHYSICAL EXAM:    Most Recent Value  Orbital Region  No depletion  Upper Arm Region  No depletion  Thoracic and Lumbar Region  No depletion  Buccal Region  No depletion  Temple Region  No depletion  Clavicle Bone Region  No depletion  Clavicle and Acromion Bone Region  No depletion  Scapular Bone Region  No depletion  Dorsal Hand  No depletion  Patellar Region  No depletion  Anterior Thigh Region  No depletion  Posterior Calf Region  No depletion  Edema (RD Assessment)  None      Diet Order:  Diet Carb Modified  Fluid consistency: Thin; Room service appropriate? Yes  EDUCATION NEEDS:   No education needs have been identified at this time  Skin:  Skin Assessment: Reviewed RN Assessment  Last BM:  Unknown BM date  Height:   Ht Readings from Last 1 Encounters:  07/22/17 5' 3.5" (1.613 m)   Weight:   Wt Readings from Last 1 Encounters:  07/22/17 148 lb (67.1 kg)   Ideal Body Weight:  53.4 kg  BMI:  Body mass index is 25.81 kg/m.  Estimated Nutritional Needs:   Kcal:  1700-1900  Protein:  85-95 grams  Fluid:  >/= 1.7 L/d  Parks Ranger, MS, RDN, LDN 07/24/2017 12:55 PM

## 2017-07-24 NOTE — Evaluation (Signed)
Occupational Therapy Evaluation Patient Details Name: Yolanda Huffman MRN: [id number] DOB: 1941/02/06 Today's Date: 07/24/2017    History of Present Illness Yolanda Huffman is a 77 y.o. female with medical history significant for anemia, arthritis, atrial fibrillation on warfarin, coronary artery disease with PTCA in 2009, left breast cancer on anastrozole,  diastolic and systolic congestive heart failure (with echocardiogram in March 2012 showed a mild left ventricular hypertrophy with an EF of 45-50% with normal diastolic heart function), CKD 4,  diabetes mellitus type 2, hypertension, hyperlipidemia, and permanent pacemaker who presents via EMS after a mechanical fall outside her home. Now s/p R THA via direct Anterior approach   Clinical Impression   This 77 y/o F presents with the above. Pt lives alone, at baseline is mod independent with ADLs and functional mobility. Pt presents with increased pain, generalized weakness, and decreased functional performance. Pt required ModA for sit<>stand and MinA to maintain static standing with UE support this session; currently requires MaxA for LB ADLs. Pt will benefit from continued acute OT services and recommend SNF level therapies after discharge to maximize her safety and independence with ADLs and mobility prior to return home.     Follow Up Recommendations  SNF;Supervision/Assistance - 24 hour    Equipment Recommendations  Other (comment)(defer to next venue )           Precautions / Restrictions Precautions Precautions: Fall Restrictions Weight Bearing Restrictions: Yes RLE Weight Bearing: Weight bearing as tolerated      Mobility Bed Mobility               General bed mobility comments: OOB upon arrival   Transfers Overall transfer level: Needs assistance Equipment used: Rolling walker (2 wheeled) Transfers: Sit to/from Stand Sit to Stand: Mod assist         General transfer comment: assist to rise and steady while Pt  transitions UEs from recliner to RW; verbal cues for safe hand placement     Balance Overall balance assessment: Needs assistance Sitting-balance support: Feet supported Sitting balance-Leahy Scale: Fair     Standing balance support: Bilateral upper extremity supported;During functional activity;Single extremity supported Standing balance-Leahy Scale: Poor Standing balance comment: reliant on UE/forearm support while completing grooming ADLs at sink                            ADL either performed or assessed with clinical judgement   ADL Overall ADL's : Needs assistance/impaired Eating/Feeding: Set up;Sitting   Grooming: Oral care;Minimal assistance;Standing   Upper Body Bathing: Min guard;Sitting   Lower Body Bathing: Moderate assistance;Sit to/from stand   Upper Body Dressing : Min guard;Sitting   Lower Body Dressing: Maximal assistance;Sit to/from stand               Functional mobility during ADLs: Moderate assistance;Rolling walker General ADL Comments: Pt stood from recliner at sink with RW to complete grooming ADLs, requires increased time advance LEs back when returning to sitting                          Pertinent Vitals/Pain Pain Assessment: Faces Faces Pain Scale: Hurts little more Pain Location: R hip with movement  Pain Descriptors / Indicators: Aching;Grimacing Pain Intervention(s): Limited activity within patient's tolerance;Monitored during session;Repositioned     Hand Dominance Right   Extremity/Trunk Assessment Upper Extremity Assessment Upper Extremity Assessment: Generalized weakness;LUE deficits/detail LUE Coordination: decreased fine motor  Lower Extremity Assessment Lower Extremity Assessment: Defer to PT evaluation   Cervical / Trunk Assessment Cervical / Trunk Assessment: Kyphotic   Communication Communication Communication: No difficulties   Cognition Arousal/Alertness: Awake/alert Behavior During Therapy:  WFL for tasks assessed/performed Overall Cognitive Status: Within Functional Limits for tasks assessed                                 General Comments: Pt very chatty, requires redirection to task at hand   [place] expects to be discharged to:: Private residence Living Arrangements: Alone                 Bathroom Shower/Tub: Teacher, early years/pre: Handicapped height     Home Equipment: Cane - quad;Grab bars - tub/shower          Prior Functioning/Environment Level of Independence: Independent with assistive device(s)        Comments: using quad cane for ambulation         OT Problem List: Decreased strength;Impaired balance (sitting and/or standing);Pain;Decreased range of motion;Decreased activity tolerance;Decreased knowledge of use of DME or AE      OT Treatment/Interventions: Self-care/ADL training;DME and/or AE instruction;Therapeutic activities;Balance training;Therapeutic exercise;Energy conservation;Patient/family education    OT Goals(Current goals can be found in the care plan section) Acute Rehab OT Goals Patient Stated Goal: regain independence  OT Goal Formulation: With patient Time For Goal Achievement: 08/07/17 Potential to Achieve Goals: Good  OT Frequency: Min 2X/week                             AM-PAC PT "6 Clicks" Daily Activity     Outcome Measure Help from another person eating meals?: None Help from another person taking care of personal grooming?: A Little Help from another person toileting, which includes using toliet, bedpan, or urinal?: A Lot Help from another person bathing (including washing, rinsing, drying)?: A Lot Help from another person to put on and taking off regular upper body clothing?: A Little Help from another person to put on and taking off regular lower body clothing?: A Lot 6 Click Score: 16   End of Session Equipment  Utilized During Treatment: Gait belt;Rolling walker Nurse Communication: Mobility status  Activity Tolerance: Patient tolerated treatment well Patient left: in chair;with call bell/phone within reach  OT Visit Diagnosis: Unsteadiness on feet (R26.81);History of falling (Z91.81)                Time: 1700-1749 OT Time Calculation (min): 29 min Charges:  OT General Charges $OT Visit: 1 Visit OT Evaluation $OT Eval Low Complexity: 1 Low OT Treatments $Self Care/Home Management : 8-22 mins G-Codes:     Lou Cal, OT Pager [phone] 07/24/2017   Raymondo Band 07/24/2017, 3:31 PM

## 2017-07-25 DIAGNOSIS — I5042 Chronic combined systolic (congestive) and diastolic (congestive) heart failure: Secondary | ICD-10-CM

## 2017-07-25 DIAGNOSIS — W19XXXD Unspecified fall, subsequent encounter: Secondary | ICD-10-CM

## 2017-07-25 DIAGNOSIS — Z950 Presence of cardiac pacemaker: Secondary | ICD-10-CM

## 2017-07-25 LAB — BASIC METABOLIC PANEL
ANION GAP: 12 (ref 5–15)
BUN: 53 mg/dL — AB (ref 6–20)
CO2: 22 mmol/L (ref 22–32)
CREATININE: 2.53 mg/dL — AB (ref 0.44–1.00)
Calcium: 7.9 mg/dL — ABNORMAL LOW (ref 8.9–10.3)
Chloride: 103 mmol/L (ref 101–111)
GFR calc Af Amer: 20 mL/min — ABNORMAL LOW (ref >60)
GFR, EST NON AFRICAN AMERICAN: 17 mL/min — AB (ref >60)
Glucose, Bld: 102 mg/dL — ABNORMAL HIGH (ref 65–99)
Potassium: 3.4 mmol/L — ABNORMAL LOW (ref 3.5–5.1)
Sodium: 137 mmol/L (ref 135–145)

## 2017-07-25 LAB — CBC
HEMATOCRIT: 32.3 % — AB (ref 36.0–46.0)
Hemoglobin: 10.7 g/dL — ABNORMAL LOW (ref 12.0–15.0)
MCH: 29.8 pg (ref 26.0–34.0)
MCHC: 33.1 g/dL (ref 30.0–36.0)
MCV: 90 fL (ref 78.0–100.0)
PLATELETS: 168 10*3/uL (ref 150–400)
RBC: 3.59 MIL/uL — ABNORMAL LOW (ref 3.87–5.11)
RDW: 14.1 % (ref 11.5–15.5)
WBC: 9.9 10*3/uL (ref 4.0–10.5)

## 2017-07-25 LAB — PROTIME-INR
INR: 1.88
Prothrombin Time: 21.5 seconds — ABNORMAL HIGH (ref 11.4–15.2)

## 2017-07-25 LAB — GLUCOSE, CAPILLARY
GLUCOSE-CAPILLARY: 68 mg/dL (ref 65–99)
Glucose-Capillary: 117 mg/dL — ABNORMAL HIGH (ref 65–99)

## 2017-07-25 MED ORDER — HYDROCODONE-ACETAMINOPHEN 5-325 MG PO TABS: 1.0000 | ORAL_TABLET | Freq: Four times a day (QID) | ORAL | 0 refills | Status: DC | PRN

## 2017-07-25 MED ORDER — WARFARIN SODIUM 5 MG PO TABS: 5.0000 mg | ORAL_TABLET | Freq: Once | ORAL | Status: DC

## 2017-07-25 MED ORDER — POLYETHYLENE GLYCOL 3350 17 G PO PACK: 17.0000 g | PACK | Freq: Every day | ORAL | 0 refills | Status: DC | PRN

## 2017-07-25 MED ORDER — POTASSIUM CHLORIDE CRYS ER 20 MEQ PO TBCR
40.0000 meq | EXTENDED_RELEASE_TABLET | ORAL | Status: AC
  Administered 2017-07-25 (×2): 40 meq via ORAL
  Filled 2017-07-25 (×2): qty 2

## 2017-07-25 MED ORDER — DOCUSATE SODIUM 100 MG PO CAPS: 100.0000 mg | ORAL_CAPSULE | Freq: Two times a day (BID) | ORAL | 0 refills | Status: DC

## 2017-07-25 MED ORDER — FUROSEMIDE 40 MG PO TABS: 40.0000 mg | ORAL_TABLET | Freq: Two times a day (BID) | ORAL | 1 refills | Status: DC

## 2017-07-25 MED ORDER — METHOCARBAMOL 500 MG PO TABS: 500.0000 mg | ORAL_TABLET | Freq: Four times a day (QID) | ORAL | Status: DC | PRN

## 2017-07-25 MED ORDER — GLUCERNA SHAKE PO LIQD: 237.0000 mL | Freq: Two times a day (BID) | ORAL | 0 refills | Status: DC

## 2017-07-25 MED ORDER — ENOXAPARIN SODIUM 80 MG/0.8ML ~~LOC~~ SOLN: 1.0000 mg/kg | SUBCUTANEOUS | 0 refills | Status: DC

## 2017-07-25 NOTE — Clinical Social Work Placement (Signed)
   CLINICAL SOCIAL WORK PLACEMENT  NOTE  Date:  07/25/2017  Patient Details  Name: Yolanda Huffman MRN: [id number] Date of Birth: January 06, 1941  Clinical Social Work is seeking post-discharge placement for this patient at the [place] level of care (*CSW will initial, date and re-position this form in  chart as items are completed):  Yes   Patient/family provided with [place] Work Department's list of facilities offering this level of care within the geographic area requested by the patient (or if unable, by the patient's family).  Yes   Patient/family informed of their freedom to choose among providers that offer the needed level of care, that participate in Medicare, Medicaid or managed care program needed by the patient, have an available bed and are willing to accept the patient.  Yes   Patient/family informed of [facility]'s ownership interest in [facility] and [facility] as well as of the fact that they are under no obligation to receive care at these facilities.  PASRR submitted to EDS on       PASRR number received on       Existing PASRR number confirmed on 07/24/17     FL2 transmitted to all facilities in geographic area requested by pt/family on       FL2 transmitted to all facilities within larger geographic area on 07/25/17     Patient informed that his/her managed care company has contracts with or will negotiate with certain facilities, including the following:        Yes   Patient/family informed of bed offers received.  Patient chooses bed at [facility]     Physician recommends and patient chooses bed at      Patient to be transferred to [facility] on 07/25/17.  Patient to be transferred to facility by [facility]     Patient family notified on 07/25/17 of transfer.  Name of family member notified:  daughter contacted     PHYSICIAN       Additional Comment:     _______________________________________________ Normajean Baxter, LCSW 07/25/2017, 10:24 AM

## 2017-07-25 NOTE — Clinical Social Work Note (Signed)
Clinical Social Work Assessment  Patient Details  Name: Yolanda Huffman MRN: [id number] Date of Birth: September 30, 1940  Date of referral:  07/25/17               Reason for consult:  Facility Placement                Permission sought to share information with:  Chartered certified accountant granted to share information::  Yes, Verbal Permission Granted  Name::     [place]::  SNF  Relationship::  daughter  Contact Information:     Housing/Transportation Living arrangements for the past 2 months:  Single Family Home Source of Information:  Patient, Adult Children Patient Interpreter Needed:  None Criminal Activity/Legal Involvement Pertinent to Current Situation/Hospitalization:  No - Comment as needed Significant Relationships:  Adult Children Lives with:  Self, Pets Do you feel safe going back to the place where you live?  No Need for family participation in patient care:  No (Coment)  Care giving concerns:  Pt from home alone and will need short term rehab at discharge. Pt agreeable to SNF and indicated that she has been to [facility] in the past. CSW obtained permission to send to [facility]'s to see who can offer a bed. Pt indicated that she was ambulating independently and used a can when she went out in the community.  CSW explained SNF process and options. CSW explained the insurance process as authorization will be needed before patient can dc to SNF. CSW confirmed the transportation to SNF.  Social Worker assessment / plan:  CSW will f/u for disposition.  Employment status:  Retired Nurse, adult PT Recommendations:  [place] / Referral to community resources:  [place]  Patient/Family's Response to care:  Patient agreeable to SNF at discharge. No issues or concerns identified.  Patient/Family's Understanding of and Emotional Response to Diagnosis, Current Treatment, and Prognosis:   Patient/family understands diagnosis as they acknowledge that new impairment is a barrier. Pt also, has experience with SNF and desires to return. Pt understands that since she resides alone, she cannot return home until she is improved. Pt has sent her cat to her daughter's home and desires to return home to her independence when she is more ambulatory. No issues or concerns identified.   Emotional Assessment Appearance:  Appears stated age Attitude/Demeanor/Rapport:  (Cooperative) Affect (typically observed):  Accepting, Appropriate Orientation:  Oriented to Self, Oriented to Place, Oriented to Situation, Oriented to  Time Alcohol / Substance use:  Not Applicable Psych involvement (Current and /or in the community):  No (Comment)  Discharge Needs  Concerns to be addressed:  Discharge Planning Concerns Readmission within the last 30 days:  No Current discharge risk:  Physical Impairment, Dependent with Mobility Barriers to Discharge:  No Barriers Identified   Normajean Baxter, LCSW 07/25/2017, 10:13 AM

## 2017-07-25 NOTE — Discharge Summary (Signed)
Physician Discharge Summary  Yolanda Huffman OQH:[id number] DOB: [birth year] DOA: 07/22/2017  PCP: Eulas Post, MD  Admit date: 07/22/2017 Discharge date: 07/25/2017  Admitted From: home Disposition:  SNF   Recommendations for Outpatient Follow-up:  1. F/u daily weights - adjust dose of Lasix as needed for fluid gain 2. - f/u Bmet in 4 days 3. 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 Has started working with PT but has not done much yet. No other complaints.   Brief Summary: Yolanda Huffman is a 77 y.o.femalwith medical history significantfor anemia, arthritis, atrial fibrillation on warfarin, coronary artery disease with PTCA in 2009, left breast cancer on anastrozole,  diastolic and systolic congestive heart failure (with echocardiogram in March 2012 showed a mild left ventricular hypertrophy with an EF of 45-50% ), CKD 4,  diabetes mellitus type 2, hypertension, hyperlipidemia, and permanent pacemaker who presents via EMS after a mechanical fall outside her home. She is admitted for a right femoral neck fracture.    Hospital Course:  Principal Problem:   Closed displaced fracture of right femoral neck - management per ortho  Active Problems: A-fib on chronic anticoagulation - Coumadin held/reversed for surgery with vit K - cont Toprol for rate control- HR currently controlled - Coumadin resumed  but due to Vit K being given, it may be a number of days before she is therapeutic again and this will also put her at a high risk to develop a post op DVT   - I start Lovenox post op and will continue until INR therapeutic- needs daily INR at nursing home  Anemia due to acute blood loss as a result of a fracture - Hb 13.1 >> 10.7 today   Hypokalemia - replaced  CKD 4 - stable- follow   Chronic systolic and diastolic CHF - EF 54-65% - have cut back on Lasix and Zaroxolyn in peri-op period to prevent AKI - will be discharged on Lasix 40 BID (instead of 80 + 40) - cont Zaroxolyn at home dose - following daily weights and check Bmet in 4 days  Anorexia - poor appetite-  Cont to follow weight- supplements added    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  - stable- cont Lantus- added SSI novolog in hospital    Hyperlipidemia - cont Lipitor    Essential hypertension -Toprol XL resumed- diuretic dose lowered    Coronary atherosclerosis - appears that she is on Coumadin but not on aspirin at home- cont statin   Cardiac pacemaker in situ    Discharge Exam: Vitals:   07/24/17 2135 07/25/17 0626  BP: 136/85 (!) 164/72  Pulse: 69 (!) 112  Resp:  18  Temp: 98 F (36.7 C) 97.9 F (36.6 C)  SpO2: 94% 91%   Vitals:   07/24/17 0042 07/24/17 0535 07/24/17 2135 07/25/17 0626  BP: 138/60 (!) 135/59 136/85 (!) 164/72  Pulse: 71 68 69 (!) 112  Resp: 17 17  18   Temp: 97.6 F (36.4 C) 98 F (36.7 C) 98 F (36.7 C) 97.9 F (36.6 C)  TempSrc: Oral Oral Oral Oral  SpO2: 97% 98% 94% 91%  Weight:      Height:        General: Pt is alert, awake, not in acute distress Cardiovascular: RRR, S1/S2 +, no rubs, no gallops Respiratory: CTA bilaterally, no wheezing, no rhonchi Abdominal: Soft, NT, ND, bowel sounds + Extremities: no edema, no cyanosis   Discharge Instructions  Discharge Instructions    Diet - low sodium heart healthy   Complete by:  As directed     Diet Carb Modified   Complete by:  As directed    Full weight bearing   Complete by:  As directed    Increase activity slowly   Complete by:  As directed      Allergies as of 07/25/2017      Reactions   Amlodipine Besylate Swelling   edema      Medication List    TAKE these medications   acetaminophen 325 MG tablet Commonly known as:  TYLENOL Take 325-650 mg by mouth every 6 (six) hours as needed for mild pain.   anastrozole 1 MG tablet Commonly known as:  ARIMIDEX Take 1 tablet (1 mg total) by mouth daily.   atorvastatin 40 MG tablet Commonly known as:  LIPITOR TAKE ONE TABLET BY MOUTH DAILY What changed:    how much to take  how to take this  when to take this   calcitRIOL 0.25 MCG capsule Commonly known as:  ROCALTROL Take 0.25 mcg by mouth every Monday, Wednesday, and Friday. In the morning.   colchicine 0.6 MG tablet TAKE 1 TABLET (0.6 MG TOTAL) BY MOUTH AS NEEDED AS DIRECTED. What changed:  See the new instructions.   Cranberry 500 MG Caps Take 500 mg by mouth daily with lunch.   docusate sodium 100 MG capsule Commonly known as:  COLACE Take 1 capsule (100 mg total) by mouth 2 (two) times daily.   enoxaparin 80 MG/0.8ML injection Commonly known as:  LOVENOX Inject 0.65 mLs (65 mg total) into the skin daily. Start taking on:  07/26/2017   feeding supplement (GLUCERNA SHAKE) Liqd Take 237 mLs by mouth 2 (two) times daily between meals.   Fish Oil 1000 MG Caps Take 1,000 mg by mouth 2 (two) times daily.   furosemide 40 MG tablet Commonly known as:  LASIX Take 1 tablet (40 mg total) by mouth 2 (two) times daily. What changed:  See the new instructions.   glucose blood test strip 1 each by Other route 3 (three) times daily. Use as instructed   HYDROcodone-acetaminophen 5-325 MG tablet Commonly known as:  NORCO/VICODIN Take 1 tablet by mouth every 6 (six) hours as needed for moderate pain or severe pain. What changed:    how much to  take  when to take this   insulin detemir 100 UNIT/ML injection Commonly known as:  LEVEMIR Inject 12 Units into the skin at bedtime. And on sliding scale   methocarbamol 500 MG tablet Commonly known as:  ROBAXIN Take 1 tablet (500 mg total) by mouth every 6 (six) hours as needed for muscle spasms.   metolazone 2.5 MG tablet Commonly known as:  ZAROXOLYN Take 1 tablet (2.5 mg total) by mouth every other day. Take one tab Mon, Wed and Fri.  Take with your lasix.  Take an  extra potassium 20 meq on these days   metoprolol succinate 25 MG 24 hr tablet Commonly known as:  TOPROL-XL TAKE 2 TABLETS BY MOUTH EVERY MORNING AND THEN ONE TABLET BY MOUTH EVERY EVENING What changed:  See the new instructions.   multivitamin with minerals Tabs tablet Take 1 tablet by mouth daily. FOR ADULT 50+   pantoprazole 40 MG tablet Commonly known as:  PROTONIX TAKE ONE TABLET BY MOUTH DAILY What changed:    how much to take  how to take this  when to take this   polyethylene glycol packet Commonly known as:  MIRALAX / GLYCOLAX Take 17 g by mouth daily as needed for mild constipation.   potassium chloride SA 20 MEQ tablet Commonly known as:  K-DUR,KLOR-CON TAKE TWO TABLETS BY MOUTH TWICE A DAY WITH FOOD AND WATER AS DIRECTED. What changed:    how much to take  how to take this  when to take this  additional instructions   warfarin 5 MG tablet Commonly known as:  COUMADIN Take as directed. If you are unsure how to take this medication, talk to your nurse or doctor. Original instructions:  TAKE AS DIRECTED BY ANTICOAGULATION CLINIC What changed:  See the new instructions.            Discharge Care Instructions  (From admission, onward)        Start     Ordered   07/25/17 0000  Full weight bearing     07/25/17 0715     Follow-up Information    Mcarthur Rossetti, MD. Schedule an appointment as soon as possible for a visit in 2 week(s).   Specialty:  Orthopedic  Surgery Contact information: [address] [place] [address] [phone]          Allergies  Allergen Reactions  . Amlodipine Besylate Swelling    edema     Procedures/Studies:  1/27- Dr Ninfa Linden- Right total hip arthroplasty through direct anterior approach.    Dg Chest 1 View  Result Date: 07/22/2017 CLINICAL DATA:  Fall with right leg deformity. EXAM: CHEST 1 VIEW COMPARISON:  06/08/2011. FINDINGS: The cardio pericardial silhouette is enlarged. Interstitial markings are diffusely coarsened with chronic features. Bones are diffusely demineralized. Left permanent pacemaker again noted. Telemetry leads overlie the chest. IMPRESSION: Cardiomegaly without acute cardiopulmonary findings. Electronically Signed   By: Misty Stanley M.D.   On: 07/22/2017 11:12   Dg Knee 1-2 Views Right  Result Date: 07/22/2017 CLINICAL DATA:  Known proximal femoral fracture EXAM: RIGHT KNEE - 2 VIEW COMPARISON:  None. FINDINGS: No evidence of fracture, dislocation, or joint effusion. No evidence of arthropathy or other focal bone abnormality. Soft tissues are unremarkable. IMPRESSION: No acute abnormality noted. Electronically Signed   By: Inez Catalina M.D.   On: 07/22/2017 11:13   Pelvis Portable  Result Date: 07/23/2017 CLINICAL DATA:  Right hip replacement EXAM: DG C-ARM 61-120 MIN; PORTABLE PELVIS 1-2 VIEWS; OPERATIVE RIGHT HIP WITH PELVIS COMPARISON:  07/22/2017 FINDINGS: 3 intraoperative spot fluoro film show the patient to undergo right hip replacement. No evidence for immediate hardware complications. Postoperative AP portable supine view of the lower pelvis shows right hip arthroplasty. No evidence for immediate hardware complications. IMPRESSION: Right hip total arthroplasty without evidence for complicating features. Electronically Signed   By: Misty Stanley M.D.   On: 07/23/2017 14:02   Dg C-arm 1-60 Min  Result Date: 07/23/2017 CLINICAL DATA:  Right hip replacement EXAM: DG  C-ARM 61-120 MIN; PORTABLE PELVIS  1-2 VIEWS; OPERATIVE RIGHT HIP WITH PELVIS COMPARISON:  07/22/2017 FINDINGS: 3 intraoperative spot fluoro film show the patient to undergo right hip replacement. No evidence for immediate hardware complications. Postoperative AP portable supine view of the lower pelvis shows right hip arthroplasty. No evidence for immediate hardware complications. IMPRESSION: Right hip total arthroplasty without evidence for complicating features. Electronically Signed   By: Misty Stanley M.D.   On: 07/23/2017 14:02   Dg Hip Operative Unilat W Or W/o Pelvis Right  Result Date: 07/23/2017 CLINICAL DATA:  Right hip replacement EXAM: DG C-ARM 61-120 MIN; PORTABLE PELVIS 1-2 VIEWS; OPERATIVE RIGHT HIP WITH PELVIS COMPARISON:  07/22/2017 FINDINGS: 3 intraoperative spot fluoro film show the patient to undergo right hip replacement. No evidence for immediate hardware complications. Postoperative AP portable supine view of the lower pelvis shows right hip arthroplasty. No evidence for immediate hardware complications. IMPRESSION: Right hip total arthroplasty without evidence for complicating features. Electronically Signed   By: Misty Stanley M.D.   On: 07/23/2017 14:02   Dg Hip Unilat W Or Wo Pelvis 2-3 Views Right  Result Date: 07/22/2017 CLINICAL DATA:  Recent fall with right leg pain, initial encounter EXAM: DG HIP (WITH OR WITHOUT PELVIS) 2-3V RIGHT COMPARISON:  None. FINDINGS: Pelvic ring is intact. Calcified uterine fibroid is noted. There is a right femoral neck fracture with impaction at the fracture site. No gross soft tissue abnormality is seen. No other fractures are noted. IMPRESSION: Right femoral neck fracture Electronically Signed   By: Inez Catalina M.D.   On: 07/22/2017 11:13     The results of significant diagnostics from this hospitalization (including imaging, microbiology, ancillary and laboratory) are listed below for reference.     Microbiology: Recent Results (from the  past 240 hour(s))  Surgical pcr screen     Status: None   Collection Time: 07/22/17  6:35 PM  Result Value Ref Range Status   MRSA, PCR NEGATIVE NEGATIVE Final   Staphylococcus aureus NEGATIVE NEGATIVE Final    Comment: (NOTE) The Xpert SA Assay (FDA approved for NASAL specimens in patients 59 years of age and older), is one component of a comprehensive surveillance program. It is not intended to diagnose infection nor to guide or monitor treatment.      Labs: BNP (last 3 results) No results for input(s): BNP in the last 8760 hours. Basic Metabolic Panel: Recent Labs  Lab 07/22/17 1034 07/23/17 0722 07/24/17 0610 07/25/17 0643  NA 133* 134* 135 137  K 3.1* 3.6 3.1* 3.4*  CL 98* 101 102 103  CO2 20* 18* 21* 22  GLUCOSE 112* 85 122* 102*  BUN 53* 51* 51* 53*  CREATININE 2.52* 2.36* 2.40* 2.53*  CALCIUM 8.5* 8.1* 7.8* 7.9*   Liver Function Tests: No results for input(s): AST, ALT, ALKPHOS, BILITOT, PROT, ALBUMIN in the last 168 hours. No results for input(s): LIPASE, AMYLASE in the last 168 hours. No results for input(s): AMMONIA in the last 168 hours. CBC: Recent Labs  Lab 07/22/17 1034 07/23/17 0722 07/24/17 0610 07/25/17 0643  WBC 9.8 8.8 9.3 9.9  HGB 13.1 11.9* 11.0* 10.7*  HCT 37.7 34.5* 33.0* 32.3*  MCV 88.5 88.5 89.2 90.0  PLT 199 176 192 168   Cardiac Enzymes: No results for input(s): CKTOTAL, CKMB, CKMBINDEX, TROPONINI in the last 168 hours. BNP: Invalid input(s): POCBNP CBG: Recent Labs  Lab 07/24/17 0622 07/24/17 1138 07/24/17 1650 07/24/17 2134 07/25/17 0705  GLUCAP 116* 93 196* 110* 68   D-Dimer No results for  input(s): DDIMER in the last 72 hours. Hgb A1c No results for input(s): HGBA1C in the last 72 hours. Lipid Profile No results for input(s): CHOL, HDL, LDLCALC, TRIG, CHOLHDL, LDLDIRECT in the last 72 hours. Thyroid function studies No results for input(s): TSH, T4TOTAL, T3FREE, THYROIDAB in the last 72 hours.  Invalid input(s):  FREET3 Anemia work up No results for input(s): VITAMINB12, FOLATE, FERRITIN, TIBC, IRON, RETICCTPCT in the last 72 hours. Urinalysis    Component Value Date/Time   COLORURINE LT. YELLOW 07/01/2011 1203   APPEARANCEUR CLEAR 07/01/2011 1203   LABSPEC 1.010 07/01/2011 1203   PHURINE 6.0 07/01/2011 1203   GLUCOSEU NEGATIVE 07/01/2011 1203   HGBUR TRACE-LYSED 07/01/2011 1203   BILIRUBINUR neg 05/02/2013 1215   KETONESUR NEGATIVE 07/01/2011 1203   PROTEINUR 1 05/02/2013 1215   PROTEINUR NEGATIVE 09/15/2010 0449   UROBILINOGEN 0.2 05/02/2013 1215   UROBILINOGEN 0.2 07/01/2011 1203   NITRITE neg 05/02/2013 1215   NITRITE NEGATIVE 07/01/2011 1203   LEUKOCYTESUR moderate (2+) 05/02/2013 1215   Sepsis Labs Invalid input(s): PROCALCITONIN,  WBC,  LACTICIDVEN Microbiology Recent Results (from the past 240 hour(s))  Surgical pcr screen     Status: None   Collection Time: 07/22/17  6:35 PM  Result Value Ref Range Status   MRSA, PCR NEGATIVE NEGATIVE Final   Staphylococcus aureus NEGATIVE NEGATIVE Final    Comment: (NOTE) The Xpert SA Assay (FDA approved for NASAL specimens in patients 41 years of age and older), is one component of a comprehensive surveillance program. It is not intended to diagnose infection nor to guide or monitor treatment.      Time coordinating discharge: Over 30 minutes  SIGNED:   Debbe Odea, MD  [facility] 07/25/2017, 9:44 AM Pager   If 7PM-7AM, please contact night-coverage [email or web address] Password [email or web address]

## 2017-07-25 NOTE — Progress Notes (Signed)
Subjective: 2 Days Post-Op Procedure(s) (LRB): TOTAL HIP ARTHROPLASTY ANTERIOR APPROACH (Right) Patient reports pain as moderate.  Worked with therapy yesterday.  Skilled nursing has been recommended post-hospital stay.  Objective: Vital signs in last 24 hours: Temp:  [97.9 F (36.6 C)-98 F (36.7 C)] 97.9 F (36.6 C) (01/29 0626) Pulse Rate:  [69-112] 112 (01/29 0626) Resp:  [18] 18 (01/29 0626) BP: (136-164)/(72-85) 164/72 (01/29 0626) SpO2:  [91 %-94 %] 91 % (01/29 0626)  Intake/Output from previous day: 01/28 0701 - 01/29 0700 In: 210 [P.O.:210] Out: 1250 [Urine:1250] Intake/Output this shift: No intake/output data recorded.  Recent Labs    07/22/17 1034 07/23/17 0722 07/24/17 0610  HGB 13.1 11.9* 11.0*   Recent Labs    07/23/17 0722 07/24/17 0610  WBC 8.8 9.3  RBC 3.90 3.70*  HCT 34.5* 33.0*  PLT 176 192   Recent Labs    07/23/17 0722 07/24/17 0610  NA 134* 135  K 3.6 3.1*  CL 101 102  CO2 18* 21*  BUN 51* 51*  CREATININE 2.36* 2.40*  GLUCOSE 85 122*  CALCIUM 8.1* 7.8*   Recent Labs    07/23/17 0722 07/24/17 0610  INR 1.49 1.34    Sensation intact distally Intact pulses distally Dorsiflexion/Plantar flexion intact Incision: dressing C/D/I  Assessment/Plan: 2 Days Post-Op Procedure(s) (LRB): TOTAL HIP ARTHROPLASTY ANTERIOR APPROACH (Right) Up with therapy Discharge to SNF - ok from Ortho standpoint when bed available and medically cleared.  Mcarthur Rossetti 07/25/2017, 7:13 AM

## 2017-07-25 NOTE — Social Work (Addendum)
Clinical Social Worker facilitated patient discharge including contacting patient family and facility to confirm patient discharge plans.  Clinical information faxed to facility and family agreeable with plan.   CSW arranged ambulance transport via [facility] to [facility].    RN to call [phone] to give report prior to discharge. Pt going to Room 602p.  Clinical Social Worker will sign off for now as social work intervention is no longer needed. Please consult [place] again if new need arises.  Elissa Hefty, LCSW Clinical Social Worker [phone]

## 2017-07-25 NOTE — Progress Notes (Signed)
Report given to Donalda Ewings, RN supervisor @ [facility]. Questions answered. [facility] in to transfer out at this time. Elita Boone, BSN, RN

## 2017-07-25 NOTE — Progress Notes (Signed)
[place] for Warfarin / Lovenox Indication: atrial fibrillation and VTE px s/p R-THA  Allergies  Allergen Reactions  . Amlodipine Besylate Swelling    edema    Patient Measurements: Height: 5' 3.5" (161.3 cm) Weight: 148 lb (67.1 kg) IBW/kg (Calculated) : 53.55 Heparin Dosing Weight:   Vital Signs: Temp: 97.9 F (36.6 C) (01/29 0626) Temp Source: Oral (01/29 0626) BP: 164/72 (01/29 0626) Pulse Rate: 112 (01/29 0626)  Labs: Recent Labs    07/22/17 1203 07/23/17 0722 07/24/17 0610 07/25/17 0643  HGB  --  11.9* 11.0* 10.7*  HCT  --  34.5* 33.0* 32.3*  PLT  --  176 192 168  APTT 39* 31  --   --   LABPROT 24.3* 17.9* 16.5* 21.5*  INR 2.20 1.49 1.34 1.88  CREATININE  --  2.36* 2.40* 2.53*    Estimated Creatinine Clearance: 17.3 mL/min (A) (by C-G formula based on SCr of 2.53 mg/dL (H)).   Medical History: Past Medical History:  Diagnosis Date  . Anemia   . Anemia   . Arthritis    gout  . Atrial fibrillation ([place])    prior Sotalol - d/c'd 2/2 increased creatinine; rate control strategy  . CAD (coronary artery disease)    s/p PCI in [place] in 1/09  . Cancer [facility])    left breast  . Chronic combined systolic and diastolic heart failure (HCC)    Echocardiogram 3/12: Mild LVH, EF 35-00%, normal diastolic function, mild AI, mild MR, PASP 44, normal wall motion  . CKD (chronic kidney disease)    sees Dr. Burman Foster - Stage 3 ?  Marland Kitchen Diabetes mellitus   . Hyperlipemia   . Hypertension   . MI, old    2009  . Osteoporosis   . Pacemaker   . Pleural effusion   . Pneumonia   . PUD (peptic ulcer disease) 10/2010   duodenal ulcer    Assessment: 20 YOF who presented on 1/26 s/p fall with R-hip fracture needing repair - now s/p R-THA on 1/27. The patient was on warfarin PTA for hx Afib. Admit INR 2.2 - reversed with Vit K 10 mg IV x 1 on 1/26 for surgery. Pharmacy consulted to resume dosing post-op on 1/27, Lovenox started 1/28 while  subtherapeutic.  INR is SUBtherapeutic today but increased to 1.88. Hg low stable, plt wnl, SCr up to 2.53. Likely will be fighting against the effects of Vit K to get INR back up to goal.  PTA dose: 5 mg daily EXCEPT 2.5 mg on MWF   Goal of Therapy:  INR 2-3 Anti-Xa level 0.6-1 units/ml 4hrs after LMWH dose given Monitor platelets by anticoagulation protocol: Yes   Plan:  Lovenox 65mg  (~1mg /kg) Candelaria Arenas q24h while subtherapeutic Warfarin 5mg  x 1 dose this evening Monitor daily PT/INR, CBC at least q72h, s/sx bleeding  Elicia Lamp, PharmD, BCPS Clinical Pharmacist Clinical phone for 07/25/2017 until 3:30pm: [phone] If after 3:30pm, please call main pharmacy at: [phone] 07/25/2017 9:14 AM

## 2017-07-25 NOTE — NC FL2 (Signed)
[place] MEDICAID FL2 LEVEL OF CARE SCREENING TOOL     IDENTIFICATION  Patient Name: Yolanda Huffman Birthdate: 05/05/1941 Sex: female Admission Date (Current Location): 07/22/2017  [facility] and [place] Number:  Herbalist and Address:  [facility], [place] [address] [place], [place] [address]      Provider Number: [id number]  Attending Physician Name and Address:  Debbe Odea, MD  Relative Name and Phone Number:  Grayland Jack, daughter, [phone]    Current Level of Care: Hospital Recommended Level of Care: [place] Prior Approval Number:    Date Approved/Denied:   PASRR Number: [id number]  Discharge Plan: SNF    Current Diagnoses: Patient Active Problem List   Diagnosis Date Noted  . Closed displaced fracture of right femoral neck ([place]) 07/22/2017  . Long term (current) use of anticoagulants 03/08/2017  . Genetic testing 09/07/2016  . 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 Gout 11/03/2015  . Personal history of colonic polyps 04/15/2015  . Chronic anticoagulation 04/15/2015  . Insulin dependent diabetes mellitus ([place]) 04/15/2015  . Encounter for therapeutic drug monitoring 08/01/2013  . CKD (chronic kidney disease) 06/14/2011  . Acute on chronic combined systolic and diastolic heart failure ([place]) 06/14/2011  . Pleural effusion 06/10/2011  . DOE (dyspnea on exertion) 06/10/2011  . Edema 11/30/2010  . Duodenal ulcer 11/12/2010  . Pneumonia 11/12/2010  . Anemia associated with acute blood loss 08/12/2010  . BLOOD IN STOOL 08/06/2010  . ATRIAL FIBRILLATION 03/24/2010  . 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 Hyperlipidemia 02/23/2010  . Essential hypertension 02/23/2010  . Coronary atherosclerosis 02/23/2010  . UTI'S, RECURRENT 02/23/2010  . Osteoporosis 02/23/2010  . Cardiac pacemaker in situ 02/23/2010    Orientation RESPIRATION BLADDER Height & Weight      Self, Time, Situation, Place  O2(Nasal Cannula 2L) Incontinent Weight: 148 lb (67.1 kg) Height:  5' 3.5" (161.3 cm)  BEHAVIORAL SYMPTOMS/MOOD NEUROLOGICAL BOWEL NUTRITION STATUS      Continent Diet(See DC Summary)  AMBULATORY STATUS COMMUNICATION OF NEEDS Skin   Extensive Assist Verbally Surgical wounds                       Personal Care Assistance Level of Assistance  Dressing, Bathing, Feeding Bathing Assistance: Maximum assistance Feeding assistance: Limited assistance Dressing Assistance: Maximum assistance     Functional Limitations Info  Sight, Hearing, Speech Sight Info: Adequate Hearing Info: Adequate Speech Info: Adequate    SPECIAL CARE FACTORS FREQUENCY  PT (By licensed PT), OT (By licensed OT)     PT Frequency: 5x week OT Frequency: 5x week            Contractures      Additional Factors Info  Code Status, Allergies, Insulin Sliding Scale Code Status Info: Full Allergies Info: AMLODIPINE BESYLATE    Insulin Sliding Scale Info: Insulin       Current Medications (07/25/2017):  This is the current hospital active medication list Current Facility-Administered Medications  Medication Dose Route Frequency Provider Last Rate Last Dose  . acetaminophen (TYLENOL) tablet 650 mg  650 mg Oral Q6H PRN Mcarthur Rossetti, MD       Or  . acetaminophen (TYLENOL) suppository 650 mg  650 mg Rectal Q6H PRN Mcarthur Rossetti, MD      . anastrozole (ARIMIDEX) tablet 1 mg  1 mg Oral Daily Lady Deutscher, MD  1 mg at 07/24/17 0808  . atorvastatin (LIPITOR) tablet 40 mg  40 mg Oral Daily Lady Deutscher, MD   40 mg at 07/24/17 5176  . calcitRIOL (ROCALTROL) capsule 0.25 mcg  0.25 mcg Oral Q M,W,F Lady Deutscher, MD   0.25 mcg at 07/24/17 0805  . docusate sodium (COLACE) capsule 100 mg  100 mg Oral BID Lady Deutscher, MD   100 mg at 07/24/17 2233  . enoxaparin (LOVENOX) injection 65 mg  1 mg/kg Subcutaneous Q24H Romona Curls, RPH   65 mg at  07/24/17 1128  . feeding supplement (GLUCERNA SHAKE) (GLUCERNA SHAKE) liquid 237 mL  237 mL Oral BID BM Rizwan, Saima, MD   237 mL at 07/24/17 1312  . furosemide (LASIX) tablet 40 mg  40 mg Oral q1800 Lady Deutscher, MD   40 mg at 07/24/17 1703  . HYDROcodone-acetaminophen (NORCO/VICODIN) 5-325 MG per tablet 1-2 tablet  1-2 tablet Oral Q6H PRN Lady Deutscher, MD   1 tablet at 07/24/17 2248  . insulin aspart (novoLOG) injection 0-5 Units  0-5 Units Subcutaneous QHS Debbe Odea, MD   4 Units at 07/23/17 2208  . insulin aspart (novoLOG) injection 0-9 Units  0-9 Units Subcutaneous TID WC Debbe Odea, MD   2 Units at 07/24/17 1704  . insulin detemir (LEVEMIR) injection 12 Units  12 Units Subcutaneous QHS Lady Deutscher, MD   12 Units at 07/24/17 2307  . menthol-cetylpyridinium (CEPACOL) lozenge 3 mg  1 lozenge Oral PRN Mcarthur Rossetti, MD       Or  . phenol (CHLORASEPTIC) mouth spray 1 spray  1 spray Mouth/Throat PRN Mcarthur Rossetti, MD      . methocarbamol (ROBAXIN) tablet 500 mg  500 mg Oral Q6H PRN Mcarthur Rossetti, MD   500 mg at 07/24/17 2248   Or  . methocarbamol (ROBAXIN) 500 mg in dextrose 5 % 50 mL IVPB  500 mg Intravenous Q6H PRN Mcarthur Rossetti, MD      . metoCLOPramide (REGLAN) tablet 5-10 mg  5-10 mg Oral Q8H PRN Mcarthur Rossetti, MD       Or  . metoCLOPramide (REGLAN) injection 5-10 mg  5-10 mg Intravenous Q8H PRN Mcarthur Rossetti, MD      . metoprolol succinate (TOPROL-XL) 24 hr tablet 50 mg  50 mg Oral Daily Lady Deutscher, MD   50 mg at 07/24/17 1607  . morphine 2 MG/ML injection 0.5 mg  0.5 mg Intravenous Q2H PRN Mcarthur Rossetti, MD   0.5 mg at 07/24/17 0855  . multivitamin with minerals tablet 1 tablet  1 tablet Oral Daily Lady Deutscher, MD   1 tablet at 07/24/17 0805  . omega-3 acid ethyl esters (LOVAZA) capsule 2 g  2 g Oral BID Lady Deutscher, MD   2 g at 07/24/17 2234  . ondansetron (ZOFRAN) tablet  4 mg  4 mg Oral Q6H PRN Mcarthur Rossetti, MD       Or  . ondansetron [facility]) injection 4 mg  4 mg Intravenous Q6H PRN Mcarthur Rossetti, MD      . pantoprazole (PROTONIX) EC tablet 40 mg  40 mg Oral BID Lady Deutscher, MD   40 mg at 07/24/17 2235  . polyethylene glycol (MIRALAX / GLYCOLAX) packet 17 g  17 g Oral Daily PRN Lady Deutscher, MD      . potassium chloride SA (K-DUR,KLOR-CON) CR tablet 20 mEq  20 mEq Oral  BID Lady Deutscher, MD   20 mEq at 07/24/17 2236  . sorbitol 70 % solution 30 mL  30 mL Oral Daily PRN Lady Deutscher, MD      . Warfarin - Pharmacist Dosing Inpatient   Does not apply q1800 Rolla Flatten, [facility]         Discharge Medications: Please see discharge summary for a list of discharge medications.  Relevant Imaging Results:  Relevant Lab Results:   Additional Information SS#: [place], LCSW

## 2017-07-25 NOTE — Social Work (Signed)
CSW discussed SNF offers with patient and daughter. Pt accepted bed offer from [facility] as she has been there in the past.  CSW confirmed bed offer with SNF and they will initiate insurance auth.  CSW will follow for disposition.  Elissa Hefty, LCSW Clinical Social Worker [phone]

## 2017-07-31 ENCOUNTER — Ambulatory Visit: Payer: Medicare Other

## 2017-07-31 ENCOUNTER — Encounter: Payer: Medicare Other | Admitting: *Deleted

## 2017-07-31 ENCOUNTER — Telehealth: Payer: Self-pay | Admitting: Cardiology

## 2017-07-31 NOTE — Telephone Encounter (Signed)
Spoke with pt and reminded pt of remote transmission that is due today. Pt verbalized understanding.   

## 2017-08-02 ENCOUNTER — Encounter: Payer: Self-pay | Admitting: Cardiology

## 2017-08-03 ENCOUNTER — Telehealth: Payer: Self-pay | Admitting: Internal Medicine

## 2017-08-03 NOTE — Telephone Encounter (Signed)
[name] returning call- unsure of discharge date for Yolanda Huffman. I have tentatively scheduled the remote for 2/18 but Ms. Pribyl will call if she is not home by that time.

## 2017-08-03 NOTE — Telephone Encounter (Signed)
LMOM to return call to [facility].  Remote check can be rescheduled to 08/14/17 after discharge from rehab facility.

## 2017-08-03 NOTE — Telephone Encounter (Signed)
New Message   Yolanda Huffman patients daughter is calling to advised that her mother is in rehab for something that is not cardiac related. That is the reason why her mother could not do the remote check. She just did not want [place] to be concern. Her mother should be out of rehab on 08/13/17. You can contact her daughter if you need her to do anything.

## 2017-08-07 ENCOUNTER — Ambulatory Visit (INDEPENDENT_AMBULATORY_CARE_PROVIDER_SITE_OTHER): Payer: Medicare Other

## 2017-08-07 ENCOUNTER — Inpatient Hospital Stay (INDEPENDENT_AMBULATORY_CARE_PROVIDER_SITE_OTHER): Payer: Medicare Other | Admitting: Orthopaedic Surgery

## 2017-08-07 ENCOUNTER — Ambulatory Visit: Payer: Medicare Other

## 2017-08-07 ENCOUNTER — Encounter (INDEPENDENT_AMBULATORY_CARE_PROVIDER_SITE_OTHER): Payer: Self-pay | Admitting: Orthopaedic Surgery

## 2017-08-07 ENCOUNTER — Ambulatory Visit (INDEPENDENT_AMBULATORY_CARE_PROVIDER_SITE_OTHER): Payer: Medicare Other | Admitting: Orthopaedic Surgery

## 2017-08-07 DIAGNOSIS — S72001A Fracture of unspecified part of neck of right femur, initial encounter for closed fracture: Secondary | ICD-10-CM

## 2017-08-07 DIAGNOSIS — Z96641 Presence of right artificial hip joint: Secondary | ICD-10-CM | POA: Diagnosis not present

## 2017-08-07 NOTE — Progress Notes (Signed)
The patient is now just over 2 weeks status post a right total hip arthroplasty to treat a displaced right hip femoral neck fracture.  She is 77 years old.  She is been recovering at a skilled nursing facility and is scheduled to be released starting next week.  Her daughter is with her today.  She said that she has been ambulating around the facility using a walker and seems to be doing well.  On exam I can easily put her right hip through internal extra rotation with no difficulty at all.  Her pain seems to be minimal.  Her incisions well-healed with staples removed and Steri-Strips applied.  Her leg lengths appear equal.  An AP pelvis and lateral of her right hip shows a total hip arthroplasty with no complicating features.  At this point showed increase her activities through therapy at the skilled nursing facility can hopefully be set up for home therapy starting next week.  We will see her back in a month see how she is doing overall but no x-rays are needed.

## 2017-08-08 ENCOUNTER — Ambulatory Visit: Payer: Medicare Other | Admitting: Oncology

## 2017-08-14 ENCOUNTER — Ambulatory Visit (INDEPENDENT_AMBULATORY_CARE_PROVIDER_SITE_OTHER): Payer: Medicare Other | Admitting: *Deleted

## 2017-08-14 DIAGNOSIS — I482 Chronic atrial fibrillation, unspecified: Secondary | ICD-10-CM

## 2017-08-14 DIAGNOSIS — Z950 Presence of cardiac pacemaker: Secondary | ICD-10-CM

## 2017-08-15 ENCOUNTER — Telehealth: Payer: Self-pay | Admitting: Family Medicine

## 2017-08-15 ENCOUNTER — Telehealth: Payer: Self-pay | Admitting: *Deleted

## 2017-08-15 LAB — CUP PACEART REMOTE DEVICE CHECK
Brady Statistic RV Percent Paced: 99 %
Date Time Interrogation Session: 20190218154631
Implantable Lead Implant Date: 20090116
Implantable Lead Location: 753860
Implantable Lead Model: 5076
Lead Channel Impedance Value: 453 Ohm
Lead Channel Impedance Value: 67 Ohm
Lead Channel Setting Pacing Amplitude: 2.75 V
Lead Channel Setting Pacing Pulse Width: 0.4 ms
MDC IDC LEAD IMPLANT DT: 20090116
MDC IDC LEAD LOCATION: 753859
MDC IDC MSMT BATTERY IMPEDANCE: 4100 Ohm
MDC IDC MSMT BATTERY REMAINING LONGEVITY: 11 mo
MDC IDC MSMT BATTERY VOLTAGE: 2.7 V
MDC IDC MSMT LEADCHNL RV PACING THRESHOLD AMPLITUDE: 1.125 V
MDC IDC MSMT LEADCHNL RV PACING THRESHOLD PULSEWIDTH: 0.4 ms
MDC IDC PG IMPLANT DT: 20090116
MDC IDC SET LEADCHNL RV SENSING SENSITIVITY: 2 mV

## 2017-08-15 NOTE — Progress Notes (Signed)
Remote pacemaker transmission.   

## 2017-08-15 NOTE — Telephone Encounter (Signed)
Copied from [place]. Topic: General - Other >> Aug 15, 2017  3:23 PM Bea Graff, NT wrote: Reason for CRM: Pts daughter would like her mom to be type and screened to see what her moms blood type is for her Life Alert paperwork. Wanting to see if this can be done at her 09/06/17 visit.

## 2017-08-15 NOTE — Telephone Encounter (Signed)
ok 

## 2017-08-15 NOTE — Telephone Encounter (Signed)
Copied from [place]. Topic: Quick Communication - See Telephone Encounter >> Aug 15, 2017 11:48 AM Margot Ables wrote: CRM for notification. See Telephone encounter for: 08/15/17.  Pt dc from [employer] for PT, OT, SN, SW, and [place] aide Needing clarified orders for PT/OT eval & treal, SN disease and medication education and mgmt, SW for community resources, and [facility] aide for bathing and assist with ADLs

## 2017-08-15 NOTE — Telephone Encounter (Signed)
We can check her on that visit- just not sure if insurance will cover.

## 2017-08-15 NOTE — Telephone Encounter (Signed)
Patient is aware 

## 2017-08-16 NOTE — Telephone Encounter (Signed)
Verbal orders given to [facility]

## 2017-08-17 ENCOUNTER — Telehealth: Payer: Self-pay | Admitting: Family Medicine

## 2017-08-17 ENCOUNTER — Encounter: Payer: Self-pay | Admitting: Cardiology

## 2017-08-17 NOTE — Telephone Encounter (Signed)
Copied from [place] [phone]. Topic: Quick Communication - See Telephone Encounter >> Aug 17, 2017  1:04 PM Synthia Innocent wrote: CRM for notification. See Telephone encounter for:  Start of Care for PT, [facility], will begin on 08/19/17.  08/17/17.

## 2017-08-18 ENCOUNTER — Inpatient Hospital Stay: Payer: Medicare Other | Admitting: Family Medicine

## 2017-08-18 NOTE — Telephone Encounter (Signed)
OK 

## 2017-08-21 ENCOUNTER — Telehealth: Payer: Self-pay | Admitting: Family Medicine

## 2017-08-21 NOTE — Telephone Encounter (Signed)
We need to get her in for hospital follow up in next week.

## 2017-08-21 NOTE — Telephone Encounter (Signed)
Copied from [place]. Topic: Inquiry >> Aug 21, 2017  2:03 PM Scherrie Gerlach wrote: Reason for CRM: Pam with [facility] states they saw pt on Sat. 2/23. Pt is on coum she is to have coum check regularly. . Would like to know when pt should have this done.  Also, Pam states due to medicare guidelines pt is to see pcp within 1-2 weeks after discharge. Pt was discharged 2/17 They cannot bill until face to face.is done. Pt has rescheduled appt several times, next one is not until 09/06/17.  Pam states this will not work. This will be almost 4 weeks. Pam had her down for frequency  1 wk/1 , 2 wk /2, 1 wk /2  Pam would like a call back asap to advise if Dr Elease Hashimoto will covers these orders until she is seen in the office, and pt needs earlier appt than what she has now.Jeannene Patella states please call and speak with one of the managers. All info in her chart  Pt has not seen Dr Elease Hashimoto since 06/14/17

## 2017-08-22 ENCOUNTER — Telehealth: Payer: Self-pay | Admitting: Family Medicine

## 2017-08-22 NOTE — Telephone Encounter (Signed)
I spoke with pt and she is going to call back to schedule, she will need to check with daughter first.

## 2017-08-22 NOTE — Telephone Encounter (Signed)
Copied from [place]. Topic: Quick Communication - See Telephone Encounter >> Aug 22, 2017  2:57 PM Burnis Medin, NT wrote: CRM for notification. See Telephone encounter for: [place] is calling to see if she can  get verbal orders for OT for two times a week for two weeks just for transfer training. She would like a call back.  08/22/17.

## 2017-08-23 NOTE — Telephone Encounter (Signed)
Patient in on the schedule for 08/29/2017 Tuesday to see Dr. Elease Hashimoto.

## 2017-08-23 NOTE — Telephone Encounter (Signed)
Patient is on the schedule for 08/29/2017 Tuesday with Dr. Elease Hashimoto.

## 2017-08-23 NOTE — Telephone Encounter (Signed)
noted 

## 2017-08-24 ENCOUNTER — Telehealth (INDEPENDENT_AMBULATORY_CARE_PROVIDER_SITE_OTHER): Payer: Self-pay | Admitting: Orthopaedic Surgery

## 2017-08-24 NOTE — Telephone Encounter (Signed)
Gracee from [facility] called asking for verbal approval for PT 2 week 6. Also wanting to know the patients restrictions/weight bearing status, etc. CB # [phone]

## 2017-08-24 NOTE — Telephone Encounter (Signed)
Copied from [place] [phone]. Topic: Quick Communication - See Telephone Encounter >> Aug 24, 2017  4:57 PM Cleaster Corin, NT wrote: CRM for notification. See Telephone encounter for:   08/24/17. Pt. Daughter calling to see why orders have not been set up yet for pt. To have [facility] come out for someone to help pt. Take a shower pt. Hasn't had a shower within 3-4 weeks as stated by daughter. Pt. Is wanting to speak with Dr. Jaymes Graff nurse to see whats going on.

## 2017-08-24 NOTE — Telephone Encounter (Signed)
[place] is calling again to check the status of the verbal orders she needs for the patient. If someone could give her a call back about this at [phone]

## 2017-08-24 NOTE — Telephone Encounter (Signed)
Copied from [place] [phone]. Topic: General - Other >> Aug 24, 2017  2:41 PM Carolyn Stare wrote:  Shirlee Limerick with [facility] call to ask for verbal orders PT 2 times a week for 6 weeks    [phone]

## 2017-08-25 NOTE — Telephone Encounter (Signed)
Attempted to call Pam, but she is in a meeting.  Will call back at a different time.

## 2017-08-25 NOTE — Telephone Encounter (Signed)
Left detailed message on machine with verbal orders for OT. 

## 2017-08-25 NOTE — Telephone Encounter (Signed)
Left message on machine with verbal orders for PT and coumadin checks.

## 2017-08-25 NOTE — Telephone Encounter (Signed)
Verbal order given  

## 2017-08-29 ENCOUNTER — Inpatient Hospital Stay: Payer: Medicare Other | Admitting: Family Medicine

## 2017-08-29 ENCOUNTER — Ambulatory Visit (INDEPENDENT_AMBULATORY_CARE_PROVIDER_SITE_OTHER): Payer: Medicare Other | Admitting: General Practice

## 2017-08-29 ENCOUNTER — Encounter: Payer: Self-pay | Admitting: Family Medicine

## 2017-08-29 ENCOUNTER — Ambulatory Visit: Payer: Medicare Other | Admitting: Family Medicine

## 2017-08-29 VITALS — BP 110/64 | HR 97 | Temp 97.8°F | Wt 147.2 lb

## 2017-08-29 DIAGNOSIS — I482 Chronic atrial fibrillation, unspecified: Secondary | ICD-10-CM

## 2017-08-29 DIAGNOSIS — I129 Hypertensive chronic kidney disease with stage 1 through stage 4 chronic kidney disease, or unspecified chronic kidney disease: Secondary | ICD-10-CM | POA: Diagnosis not present

## 2017-08-29 DIAGNOSIS — E1122 Type 2 diabetes mellitus with diabetic chronic kidney disease: Secondary | ICD-10-CM | POA: Diagnosis not present

## 2017-08-29 DIAGNOSIS — D649 Anemia, unspecified: Secondary | ICD-10-CM | POA: Diagnosis not present

## 2017-08-29 DIAGNOSIS — I10 Essential (primary) hypertension: Secondary | ICD-10-CM

## 2017-08-29 DIAGNOSIS — Z7901 Long term (current) use of anticoagulants: Secondary | ICD-10-CM

## 2017-08-29 DIAGNOSIS — N184 Chronic kidney disease, stage 4 (severe): Secondary | ICD-10-CM

## 2017-08-29 LAB — CBC WITH DIFFERENTIAL/PLATELET
BASOS PCT: 0.7 % (ref 0.0–3.0)
Basophils Absolute: 0.1 10*3/uL (ref 0.0–0.1)
EOS ABS: 0.1 10*3/uL (ref 0.0–0.7)
Eosinophils Relative: 1.5 % (ref 0.0–5.0)
HEMATOCRIT: 36.1 % (ref 36.0–46.0)
HEMOGLOBIN: 12.1 g/dL (ref 12.0–15.0)
LYMPHS PCT: 16.8 % (ref 12.0–46.0)
Lymphs Abs: 1.3 10*3/uL (ref 0.7–4.0)
MCHC: 33.6 g/dL (ref 30.0–36.0)
MCV: 89.4 fl (ref 78.0–100.0)
Monocytes Absolute: 0.6 10*3/uL (ref 0.1–1.0)
Monocytes Relative: 7.7 % (ref 3.0–12.0)
NEUTROS ABS: 5.6 10*3/uL (ref 1.4–7.7)
Neutrophils Relative %: 73.3 % (ref 43.0–77.0)
PLATELETS: 210 10*3/uL (ref 150.0–400.0)
RBC: 4.04 Mil/uL (ref 3.87–5.11)
RDW: 14.1 % (ref 11.5–15.5)
WBC: 7.6 10*3/uL (ref 4.0–10.5)

## 2017-08-29 LAB — BASIC METABOLIC PANEL
BUN: 35 mg/dL — ABNORMAL HIGH (ref 6–23)
CO2: 30 meq/L (ref 19–32)
CREATININE: 2.03 mg/dL — AB (ref 0.40–1.20)
Calcium: 10 mg/dL (ref 8.4–10.5)
Chloride: 97 mEq/L (ref 96–112)
GFR: 25.23 mL/min — ABNORMAL LOW (ref >60.00)
Glucose, Bld: 111 mg/dL — ABNORMAL HIGH (ref 70–99)
POTASSIUM: 4.5 meq/L (ref 3.5–5.1)
Sodium: 135 mEq/L (ref 135–145)

## 2017-08-29 LAB — HEMOGLOBIN A1C: Hgb A1c MFr Bld: 6.2 % (ref 4.6–6.5)

## 2017-08-29 LAB — POCT INR: INR: 4.4

## 2017-08-29 NOTE — Progress Notes (Signed)
Subjective:     Patient ID: Yolanda Huffman, female   DOB: 1940-12-10, 77 y.o.   MRN: [id number]  HPI Patient seen for hospital and rehabilitation follow-up. Her chronic problems include history of type 2 diabetes, stage IV chronic kidney disease, hypertension, hyperlipidemia, CAD, atrial fibrillation and history of combined systolic and diastolic heart failure. She had fall back in January at home and intertrochanteric fracture right hip. Underwent right hip replacement. Followed by rehabilitation stay and was discharged home about 2 weeks ago.  She is getting around fairly well right now with walker. Her Lasix was reduced to 40 mg twice daily but she has gone back now to 80 mg the morning and 40 mg in the afternoon. Weight and edema are stable. She is now back on her Coumadin. She needs follow-up INR today. Postoperative anemia with hemoglobin 10.7. No dizziness. No chest pains.  Past Medical History:  Diagnosis Date  . Anemia   . Anemia   . Arthritis    gout  . Atrial fibrillation ([place])    prior Sotalol - d/c'd 2/2 increased creatinine; rate control strategy  . CAD (coronary artery disease)    s/p PCI in [place] in 1/09  . Cancer [facility])    left breast  . Chronic combined systolic and diastolic heart failure (H[facility])    Echocardiogram 3/12: Mild LVH, EF 13-24%, normal diastolic function, mild AI, mild MR, PASP 44, normal wall motion  . CKD (chronic kidney disease)    sees Dr. Burman Foster - Stage 3 ?  Marland Kitchen Diabetes mellitus   . Hyperlipemia   . Hypertension   . MI, old    2009  . Osteoporosis   . Pacemaker   . Pleural effusion   . Pneumonia   . PUD (peptic ulcer disease) 10/2010   duodenal ulcer   Past Surgical History:  Procedure Laterality Date  . BREAST LUMPECTOMY WITH RADIOACTIVE SEED LOCALIZATION Left 09/19/2016   Procedure: LEFT BREAST LUMPECTOMY WITH RADIOACTIVE SEED LOCALIZATION;  Surgeon: Autumn Messing III, MD;  Location: [place];  Service: General;  Laterality: Left;  . CATARACT  EXTRACTION Bilateral    01/12/09 and 01/26/09 both eyes  . COLONOSCOPY    . CORONARY ANGIOPLASTY  2009  . PACEMAKER INSERTION  07/13/07  . stent implant  07/13/2007  . TOTAL HIP ARTHROPLASTY Right 07/23/2017   Procedure: TOTAL HIP ARTHROPLASTY ANTERIOR APPROACH;  Surgeon: Mcarthur Rossetti, MD;  Location: [place];  Service: Orthopedics;  Laterality: Right;    reports that she quit smoking about 25 years ago. Her smoking use included cigarettes. She has a 40.00 pack-year smoking history. she has never used smokeless tobacco. She reports that she does not drink alcohol or use drugs. family history includes Breast cancer (age of onset: 75) in her other; Breast cancer (age of onset: 32) in her maternal aunt; Breast cancer (age of onset: 65) in her sister; Clotting disorder in her father; Heart attack in her father; Heart disease in her father and mother; Hypertension in her father. Allergies  Allergen Reactions  . Amlodipine Besylate Swelling    edema     Review of Systems  Constitutional: Negative for chills, fatigue, fever and unexpected weight change.  Eyes: Negative for visual disturbance.  Respiratory: Negative for cough, chest tightness, shortness of breath and wheezing.   Cardiovascular: Positive for leg swelling. Negative for chest pain and palpitations.  Gastrointestinal: Negative for abdominal pain.  Genitourinary: Negative for dysuria.  Neurological: Negative for dizziness, seizures, syncope, weakness, light-headedness and  headaches.       Objective:   Physical Exam  Constitutional: She appears well-developed and well-nourished.  Eyes: Pupils are equal, round, and reactive to light.  Neck: Neck supple. No JVD present. No thyromegaly present.  Cardiovascular: Normal rate and regular rhythm. Exam reveals no gallop.  Pulmonary/Chest: Effort normal and breath sounds normal. No respiratory distress. She has no wheezes. She has no rales.  Musculoskeletal: She exhibits edema.   Trace pitting edema lower legs bilaterally which is her baseline  Neurological: She is alert.       Assessment:     #1 recent right hip fracture doing well following physical therapy and rehabilitation stay  #2 hypertension stable and at goal  #3 history of type 2 diabetes with history of good control  #4 history of combined systolic and diastolic heart failure-stable  #5 chronic atrial fibrillation rate controlled and on Coumadin    Plan:     -Check further labs with CBC, basic metabolic panel, INR -Continue walker use at all times until instructed by physical therapy -Continue close follow-up with nephrology -Routine follow-up in 6 months and sooner as needed  Eulas Post MD [facility]

## 2017-08-29 NOTE — Patient Instructions (Signed)
Pre visit review using our clinic review tool, if applicable. No additional management support is needed unless otherwise documented below in the visit note.  Skip coumadin today and tomorrow and then continue to take 1/2 tablet on Monday, Wednesdays and Fridays and 1 tablet all other days.  Re-check in 2 weeks.

## 2017-09-04 ENCOUNTER — Ambulatory Visit (INDEPENDENT_AMBULATORY_CARE_PROVIDER_SITE_OTHER): Payer: Medicare Other | Admitting: Orthopaedic Surgery

## 2017-09-04 ENCOUNTER — Encounter (INDEPENDENT_AMBULATORY_CARE_PROVIDER_SITE_OTHER): Payer: Self-pay | Admitting: Orthopaedic Surgery

## 2017-09-04 DIAGNOSIS — Z96641 Presence of right artificial hip joint: Secondary | ICD-10-CM

## 2017-09-04 NOTE — Progress Notes (Signed)
The patient is following up status post a right total hip arthroplasty to treat an acute right femoral neck fracture.  She is ambulate with a rolling walker.  She is 77 years old and says she is making significant improvements over the last short period time.  She is going to therapy as well.  She does have some hip and groin pain but this is only mild.  She is sleeping better at night.  On exam I can more easily put her right hip through internal extra rotation without much difficulty at all.  Her incision looks good.  Her leg lengths are equal.  At this point should continue increase her activities.  She should continue to use this is device if she is having difficulty with mobility and balance and coordination.  I do not need to see her back now for 6 months.  At that visit I would like a low AP pelvis and lateral of her right operative hip.

## 2017-09-06 ENCOUNTER — Inpatient Hospital Stay: Payer: Medicare Other | Admitting: Family Medicine

## 2017-09-13 ENCOUNTER — Ambulatory Visit: Payer: Medicare Other | Admitting: General Practice

## 2017-09-13 DIAGNOSIS — Z7901 Long term (current) use of anticoagulants: Secondary | ICD-10-CM | POA: Diagnosis not present

## 2017-09-13 LAB — POCT INR: INR: 2.8

## 2017-09-13 NOTE — Patient Instructions (Addendum)
Pre visit review using our clinic review tool, if applicable. No additional management support is needed unless otherwise documented below in the visit note.  Continue to take 1/2 tablet on Monday, Wednesdays and Fridays and 1 tablet all other days.  Re-check in 4 weeks.

## 2017-09-14 ENCOUNTER — Ambulatory Visit (INDEPENDENT_AMBULATORY_CARE_PROVIDER_SITE_OTHER): Payer: Self-pay | Admitting: *Deleted

## 2017-09-14 ENCOUNTER — Other Ambulatory Visit: Payer: Self-pay | Admitting: Family Medicine

## 2017-09-14 DIAGNOSIS — Z950 Presence of cardiac pacemaker: Secondary | ICD-10-CM

## 2017-09-14 NOTE — Progress Notes (Signed)
Remote pacemaker transmission.   

## 2017-09-15 ENCOUNTER — Encounter: Payer: Self-pay | Admitting: Cardiology

## 2017-09-19 ENCOUNTER — Telehealth: Payer: Self-pay

## 2017-09-19 NOTE — Telephone Encounter (Signed)
Sent scheduling message for patient to have a lab/injection appts the week of April 22nd.  Cyndia Bent RN

## 2017-09-25 ENCOUNTER — Telehealth (INDEPENDENT_AMBULATORY_CARE_PROVIDER_SITE_OTHER): Payer: Self-pay | Admitting: Orthopaedic Surgery

## 2017-09-25 NOTE — Telephone Encounter (Signed)
Verbal order given to [facility]

## 2017-09-25 NOTE — Telephone Encounter (Signed)
Gracie, physical therapist with [facility] is requesting verbal orders for continued PT 2 x for 3 weeks.   CB# [phone]

## 2017-09-29 ENCOUNTER — Other Ambulatory Visit: Payer: Self-pay | Admitting: General Surgery

## 2017-09-29 DIAGNOSIS — Z9889 Other specified postprocedural states: Secondary | ICD-10-CM

## 2017-10-06 LAB — CUP PACEART REMOTE DEVICE CHECK
Battery Impedance: 4354 Ohm
Battery Remaining Longevity: 12 mo
Battery Voltage: 2.69 V
Brady Statistic RV Percent Paced: 99 %
Date Time Interrogation Session: 20190321104223
Implantable Lead Implant Date: 20090116
Implantable Lead Location: 753859
Implantable Lead Location: 753860
Lead Channel Setting Pacing Amplitude: 2.5 V
Lead Channel Setting Pacing Pulse Width: 0.4 ms
Lead Channel Setting Sensing Sensitivity: 2 mV
MDC IDC LEAD IMPLANT DT: 20090116
MDC IDC MSMT LEADCHNL RA IMPEDANCE VALUE: 67 Ohm
MDC IDC MSMT LEADCHNL RV IMPEDANCE VALUE: 418 Ohm
MDC IDC MSMT LEADCHNL RV PACING THRESHOLD AMPLITUDE: 1 V
MDC IDC MSMT LEADCHNL RV PACING THRESHOLD PULSEWIDTH: 0.4 ms
MDC IDC PG IMPLANT DT: 20090116

## 2017-10-09 ENCOUNTER — Ambulatory Visit: Payer: Medicare Other

## 2017-10-09 ENCOUNTER — Ambulatory Visit (INDEPENDENT_AMBULATORY_CARE_PROVIDER_SITE_OTHER): Payer: Medicare Other | Admitting: General Practice

## 2017-10-09 DIAGNOSIS — Z7901 Long term (current) use of anticoagulants: Secondary | ICD-10-CM

## 2017-10-09 LAB — POCT INR: INR: 3

## 2017-10-09 NOTE — Patient Instructions (Addendum)
Pre visit review using our clinic review tool, if applicable. No additional management support is needed unless otherwise documented below in the visit note.  Hold coumadin today and then continue to take 1/2 tablet on Monday, Wednesdays and Fridays and 1 tablet all other days.  Re-check in 4 weeks.

## 2017-10-10 ENCOUNTER — Other Ambulatory Visit: Payer: Self-pay | Admitting: Family Medicine

## 2017-10-13 ENCOUNTER — Encounter: Payer: Self-pay | Admitting: Family Medicine

## 2017-10-16 ENCOUNTER — Ambulatory Visit (INDEPENDENT_AMBULATORY_CARE_PROVIDER_SITE_OTHER): Payer: Self-pay | Admitting: *Deleted

## 2017-10-16 ENCOUNTER — Other Ambulatory Visit: Payer: Self-pay | Admitting: Family Medicine

## 2017-10-16 ENCOUNTER — Other Ambulatory Visit: Payer: Medicare Other

## 2017-10-16 ENCOUNTER — Ambulatory Visit: Payer: Medicare Other

## 2017-10-16 DIAGNOSIS — Z950 Presence of cardiac pacemaker: Secondary | ICD-10-CM

## 2017-10-16 NOTE — Progress Notes (Signed)
Remote pacemaker transmission.   

## 2017-10-17 ENCOUNTER — Other Ambulatory Visit: Payer: Self-pay | Admitting: Oncology

## 2017-10-17 ENCOUNTER — Inpatient Hospital Stay: Payer: Medicare Other | Attending: Oncology

## 2017-10-17 ENCOUNTER — Inpatient Hospital Stay: Payer: Medicare Other

## 2017-10-17 ENCOUNTER — Encounter: Payer: Self-pay | Admitting: Cardiology

## 2017-10-17 DIAGNOSIS — I5043 Acute on chronic combined systolic (congestive) and diastolic (congestive) heart failure: Secondary | ICD-10-CM

## 2017-10-17 DIAGNOSIS — M810 Age-related osteoporosis without current pathological fracture: Secondary | ICD-10-CM

## 2017-10-17 LAB — CUP PACEART REMOTE DEVICE CHECK
Battery Impedance: 4613 Ohm
Battery Remaining Longevity: 10 mo
Battery Voltage: 2.69 V
Date Time Interrogation Session: 20190422101017
Implantable Lead Implant Date: 20090116
Implantable Lead Implant Date: 20090116
Implantable Lead Location: 753859
Implantable Lead Location: 753860
Implantable Lead Model: 5076
Implantable Lead Model: 5076
Implantable Pulse Generator Implant Date: 20090116
Lead Channel Impedance Value: 433 Ohm
Lead Channel Impedance Value: 67 Ohm
Lead Channel Pacing Threshold Amplitude: 1.25 V
Lead Channel Pacing Threshold Pulse Width: 0.4 ms
Lead Channel Setting Pacing Amplitude: 2.5 V
Lead Channel Setting Sensing Sensitivity: 2 mV
MDC IDC SET LEADCHNL RV PACING PULSEWIDTH: 0.4 ms
MDC IDC STAT BRADY RV PERCENT PACED: 99 %

## 2017-10-17 LAB — CBC WITH DIFFERENTIAL/PLATELET
Basophils Absolute: 0.1 10*3/uL (ref 0.0–0.1)
Basophils Relative: 1 %
Eosinophils Absolute: 0.2 10*3/uL (ref 0.0–0.5)
Eosinophils Relative: 2 %
HEMATOCRIT: 35.1 % (ref 34.8–46.6)
Hemoglobin: 11.7 g/dL (ref 11.6–15.9)
LYMPHS ABS: 1.3 10*3/uL (ref 0.9–3.3)
LYMPHS PCT: 19 %
MCH: 29.5 pg (ref 25.1–34.0)
MCHC: 33.2 g/dL (ref 31.5–36.0)
MCV: 89.1 fL (ref 79.5–101.0)
MONO ABS: 0.5 10*3/uL (ref 0.1–0.9)
MONOS PCT: 7 %
NEUTROS ABS: 4.8 10*3/uL (ref 1.5–6.5)
Neutrophils Relative %: 71 %
Platelets: 199 10*3/uL (ref 145–400)
RBC: 3.94 MIL/uL (ref 3.70–5.45)
RDW: 14.5 % (ref 11.2–14.5)
WBC: 6.8 10*3/uL (ref 3.9–10.3)

## 2017-10-17 LAB — COMPREHENSIVE METABOLIC PANEL
ALT: 30 U/L (ref 0–55)
ANION GAP: 13 — AB (ref 3–11)
AST: 35 U/L — ABNORMAL HIGH (ref 5–34)
Albumin: 3.6 g/dL (ref 3.5–5.0)
Alkaline Phosphatase: 52 U/L (ref 40–150)
BILIRUBIN TOTAL: 0.5 mg/dL (ref 0.2–1.2)
BUN: 48 mg/dL — AB (ref 7–26)
CALCIUM: 10 mg/dL (ref 8.4–10.4)
CO2: 23 mmol/L (ref 22–29)
Chloride: 100 mmol/L (ref 98–109)
Creatinine, Ser: 2.56 mg/dL — ABNORMAL HIGH (ref 0.60–1.10)
GFR calc Af Amer: 20 mL/min — ABNORMAL LOW (ref >60)
GFR, EST NON AFRICAN AMERICAN: 17 mL/min — AB (ref >60)
Glucose, Bld: 125 mg/dL (ref 70–140)
POTASSIUM: 4.2 mmol/L (ref 3.5–5.1)
Sodium: 136 mmol/L (ref 136–145)
TOTAL PROTEIN: 7.5 g/dL (ref 6.4–8.3)

## 2017-10-17 MED ORDER — DENOSUMAB 60 MG/ML ~~LOC~~ SOSY
60.0000 mg | PREFILLED_SYRINGE | Freq: Once | SUBCUTANEOUS | Status: AC
  Administered 2017-10-17: 60 mg via SUBCUTANEOUS

## 2017-10-17 NOTE — Patient Instructions (Signed)

## 2017-10-26 ENCOUNTER — Other Ambulatory Visit: Payer: Self-pay | Admitting: Family Medicine

## 2017-10-30 ENCOUNTER — Ambulatory Visit
Admission: RE | Admit: 2017-10-30 | Discharge: 2017-10-30 | Disposition: A | Discharge status: Home / Self Care | Payer: Medicare Other | Source: Ambulatory Visit | Attending: General Surgery | Admitting: General Surgery

## 2017-10-30 DIAGNOSIS — Z9889 Other specified postprocedural states: Secondary | ICD-10-CM

## 2017-11-06 ENCOUNTER — Ambulatory Visit: Payer: Medicare Other

## 2017-11-09 ENCOUNTER — Other Ambulatory Visit: Payer: Self-pay | Admitting: Family Medicine

## 2017-11-13 ENCOUNTER — Ambulatory Visit: Payer: Medicare Other | Admitting: General Practice

## 2017-11-13 DIAGNOSIS — Z7901 Long term (current) use of anticoagulants: Secondary | ICD-10-CM | POA: Diagnosis not present

## 2017-11-13 LAB — POCT INR: INR: 2.3

## 2017-11-13 NOTE — Patient Instructions (Addendum)
Pre visit review using our clinic review tool, if applicable. No additional management support is needed unless otherwise documented below in the visit note.  Continue to take 1/2 tablet on Monday, Wednesdays and Fridays and 1 tablet all other days.  Re-check in 4 weeks.

## 2017-11-16 ENCOUNTER — Ambulatory Visit (INDEPENDENT_AMBULATORY_CARE_PROVIDER_SITE_OTHER): Payer: Medicare Other | Admitting: *Deleted

## 2017-11-16 DIAGNOSIS — I482 Chronic atrial fibrillation, unspecified: Secondary | ICD-10-CM

## 2017-11-16 NOTE — Progress Notes (Signed)
Remote pacemaker transmission.   

## 2017-11-28 ENCOUNTER — Telehealth: Payer: Self-pay | Admitting: Oncology

## 2017-11-28 NOTE — Telephone Encounter (Signed)
Patient called to cancel °

## 2017-11-30 ENCOUNTER — Ambulatory Visit: Payer: Medicare Other | Admitting: Oncology

## 2017-12-11 ENCOUNTER — Ambulatory Visit: Payer: Medicare Other

## 2017-12-12 LAB — CUP PACEART REMOTE DEVICE CHECK
Battery Impedance: 4728 Ohm
Battery Remaining Longevity: 10 mo
Battery Voltage: 2.69 V
Brady Statistic RV Percent Paced: 99 %
Date Time Interrogation Session: 20190523084147
Implantable Lead Implant Date: 20090116
Implantable Lead Location: 753859
Implantable Lead Model: 5076
Lead Channel Impedance Value: 67 Ohm
Lead Channel Setting Pacing Pulse Width: 0.4 ms
Lead Channel Setting Sensing Sensitivity: 2 mV
MDC IDC LEAD IMPLANT DT: 20090116
MDC IDC LEAD LOCATION: 753860
MDC IDC MSMT LEADCHNL RV IMPEDANCE VALUE: 459 Ohm
MDC IDC MSMT LEADCHNL RV PACING THRESHOLD AMPLITUDE: 1.25 V
MDC IDC MSMT LEADCHNL RV PACING THRESHOLD PULSEWIDTH: 0.4 ms
MDC IDC PG IMPLANT DT: 20090116
MDC IDC SET LEADCHNL RV PACING AMPLITUDE: 2.5 V

## 2017-12-13 ENCOUNTER — Ambulatory Visit: Payer: Medicare Other | Admitting: Family Medicine

## 2017-12-13 ENCOUNTER — Other Ambulatory Visit: Payer: Self-pay | Admitting: Family Medicine

## 2017-12-13 ENCOUNTER — Other Ambulatory Visit: Payer: Self-pay | Admitting: General Practice

## 2017-12-13 ENCOUNTER — Ambulatory Visit: Payer: Self-pay | Admitting: General Practice

## 2017-12-13 ENCOUNTER — Encounter: Payer: Self-pay | Admitting: Family Medicine

## 2017-12-13 VITALS — BP 124/62 | HR 86 | Temp 98.1°F | Wt 147.3 lb

## 2017-12-13 DIAGNOSIS — I482 Chronic atrial fibrillation, unspecified: Secondary | ICD-10-CM

## 2017-12-13 DIAGNOSIS — N184 Chronic kidney disease, stage 4 (severe): Secondary | ICD-10-CM | POA: Diagnosis not present

## 2017-12-13 DIAGNOSIS — E785 Hyperlipidemia, unspecified: Secondary | ICD-10-CM

## 2017-12-13 DIAGNOSIS — I10 Essential (primary) hypertension: Secondary | ICD-10-CM

## 2017-12-13 DIAGNOSIS — I129 Hypertensive chronic kidney disease with stage 1 through stage 4 chronic kidney disease, or unspecified chronic kidney disease: Secondary | ICD-10-CM

## 2017-12-13 DIAGNOSIS — I5042 Chronic combined systolic (congestive) and diastolic (congestive) heart failure: Secondary | ICD-10-CM

## 2017-12-13 DIAGNOSIS — E1122 Type 2 diabetes mellitus with diabetic chronic kidney disease: Secondary | ICD-10-CM | POA: Diagnosis not present

## 2017-12-13 DIAGNOSIS — Z7901 Long term (current) use of anticoagulants: Secondary | ICD-10-CM

## 2017-12-13 LAB — LIPID PANEL
Cholesterol: 164 mg/dL (ref 0–200)
HDL: 59.2 mg/dL (ref >39.00)
LDL Cholesterol: 83 mg/dL (ref 0–99)
NONHDL: 104.41
Total CHOL/HDL Ratio: 3
Triglycerides: 109 mg/dL (ref 0.0–149.0)
VLDL: 21.8 mg/dL (ref 0.0–40.0)

## 2017-12-13 LAB — HEPATIC FUNCTION PANEL
ALT: 14 U/L (ref 0–35)
AST: 21 U/L (ref 0–37)
Albumin: 4.4 g/dL (ref 3.5–5.2)
Alkaline Phosphatase: 53 U/L (ref 39–117)
Bilirubin, Direct: 0.1 mg/dL (ref 0.0–0.3)
Total Bilirubin: 0.6 mg/dL (ref 0.2–1.2)
Total Protein: 7.7 g/dL (ref 6.0–8.3)

## 2017-12-13 LAB — HEMOGLOBIN A1C: Hgb A1c MFr Bld: 6.6 % — ABNORMAL HIGH (ref 4.6–6.5)

## 2017-12-13 LAB — POCT INR: INR: 3.3 — AB (ref 2.0–3.0)

## 2017-12-13 MED ORDER — WARFARIN SODIUM 5 MG PO TABS: ORAL_TABLET | ORAL | 3 refills | Status: DC

## 2017-12-13 NOTE — Progress Notes (Signed)
Subjective:     Patient ID: Yolanda Huffman, female   DOB: [birth year], 77 y.o.   MRN: [id number]  HPI Patient seen for medical follow-up. She has history of atrial fibrillation, chronic kidney disease, hyperlipidemia, combined systolic and diastolic heart failure, hypertension, CAD. She is followed regularly by nephrology. She had recent renal function with creatinine 2.41 which is near her baseline. She takes diuretic therapy with furosemide 40 mg 2 in the morning and one evening and Zaroxolyn 3 times weekly. Home weights have been stable  Denies any dyspnea. No orthopnea. No chest pain. She is on Lipitor for hyperlipidemia. Due for follow-up lipids. Last A1c 6.2%. She's on low-dose Levemir 12 units daily. Rare hypoglycemic symptoms.  atrial fibrillation on Coumadin. She is requesting INR today. No recent bleeding complications  Past Medical History:  Diagnosis Date  . Anemia   . Anemia   . Arthritis    gout  . Atrial fibrillation ([place])    prior Sotalol - d/c'd 2/2 increased creatinine; rate control strategy  . CAD (coronary artery disease)    s/p PCI in [place] in 1/09  . Cancer [facility])    left breast  . Chronic combined systolic and diastolic heart failure (HCC)    Echocardiogram 3/12: Mild LVH, EF 51-88%, normal diastolic function, mild AI, mild MR, PASP 44, normal wall motion  . CKD (chronic kidney disease)    sees Dr. Burman Foster - Stage 3 ?  Marland Kitchen Diabetes mellitus   . Hyperlipemia   . Hypertension   . MI, old    2009  . Osteoporosis   . Pacemaker   . Pleural effusion   . Pneumonia   . PUD (peptic ulcer disease) 10/2010   duodenal ulcer   Past Surgical History:  Procedure Laterality Date  . BREAST BIOPSY Left 07/29/2016  . BREAST LUMPECTOMY Left 09/19/2016  . BREAST LUMPECTOMY WITH RADIOACTIVE SEED LOCALIZATION Left 09/19/2016   Procedure: LEFT BREAST LUMPECTOMY WITH RADIOACTIVE SEED LOCALIZATION;  Surgeon: Autumn Messing III, MD;  Location: [place];  Service: General;  Laterality:  Left;  . CATARACT EXTRACTION Bilateral    01/12/09 and 01/26/09 both eyes  . COLONOSCOPY    . CORONARY ANGIOPLASTY  2009  . PACEMAKER INSERTION  07/13/07  . stent implant  07/13/2007  . TOTAL HIP ARTHROPLASTY Right 07/23/2017   Procedure: TOTAL HIP ARTHROPLASTY ANTERIOR APPROACH;  Surgeon: Mcarthur Rossetti, MD;  Location: [place];  Service: Orthopedics;  Laterality: Right;    reports that she quit smoking about 26 years ago. Her smoking use included cigarettes. She has a 40.00 pack-year smoking history. She has never used smokeless tobacco. She reports that she does not drink alcohol or use drugs. family history includes Breast cancer (age of onset: 68) in her other; Breast cancer (age of onset: 74) in her maternal aunt; Breast cancer (age of onset: 20) in her sister; Clotting disorder in her father; Heart attack in her father; Heart disease in her father and mother; Hypertension in her father. Allergies  Allergen Reactions  . Amlodipine Besylate Swelling    edema     Review of Systems  Constitutional: Negative for fatigue and unexpected weight change.  Eyes: Negative for visual disturbance.  Respiratory: Negative for cough, chest tightness, shortness of breath and wheezing.   Cardiovascular: Negative for chest pain, palpitations and leg swelling.  Genitourinary: Negative for dysuria.  Neurological: Negative for dizziness, seizures, syncope, weakness, light-headedness and headaches.       Objective:   Physical Exam  Constitutional: She appears well-developed and well-nourished.  Eyes: Pupils are equal, round, and reactive to light.  Neck: Neck supple. No JVD present. No thyromegaly present.  Cardiovascular: Normal rate and regular rhythm. Exam reveals no gallop.  Pulmonary/Chest: Effort normal and breath sounds normal. No respiratory distress. She has no wheezes. She has no rales.  Musculoskeletal: She exhibits no edema.  Neurological: She is alert.       Assessment:     #1  type 2 diabetes. History of good control  #2 chronic kidney disease  #3 hypertension stable and at goal  #4 dyslipidemia  #5 atrial fibrillation. Appears to be in regular rhythm today. She is on chronic Coumadin    Plan:     -Check labs with INR, lipid panel, hepatic panel, A1c -She had questions about modifying her diuretic dosage. We have requested that she discuss this with her nephrology is to get their input -Routine follow-up in 6 months and sooner as needed  Eulas Post MD [facility]

## 2017-12-13 NOTE — Patient Instructions (Signed)
Pre visit review using our clinic review tool, if applicable. No additional management support is needed unless otherwise documented below in the visit note.  Hold coumadin today and then continue to take 1/2 tablet on Monday, Wednesdays and Fridays and 1 tablet all other days.  Re-check in 4 weeks.

## 2017-12-17 ENCOUNTER — Other Ambulatory Visit: Payer: Self-pay | Admitting: Oncology

## 2017-12-18 ENCOUNTER — Other Ambulatory Visit: Payer: Self-pay

## 2017-12-18 NOTE — Telephone Encounter (Signed)
Electronic Prescription for anstrazole sent to pharmacy at [facility].  Msg sent to schedulers to call pt and have her reschedule her missed appt with GM.

## 2017-12-21 ENCOUNTER — Telehealth: Payer: Self-pay | Admitting: Oncology

## 2017-12-21 NOTE — Telephone Encounter (Signed)
Left message for patient per 6/24 sch message to schedule apt for follow - left message with [facility] number for patient to call back to set up appt. Marland Kitchen

## 2017-12-22 ENCOUNTER — Telehealth: Payer: Self-pay | Admitting: Oncology

## 2017-12-22 NOTE — Telephone Encounter (Signed)
Appointment rescheduled from 6/6 per voicemail from patient 6/27

## 2017-12-31 ENCOUNTER — Other Ambulatory Visit: Payer: Self-pay | Admitting: Internal Medicine

## 2017-12-31 ENCOUNTER — Other Ambulatory Visit: Payer: Self-pay | Admitting: Family Medicine

## 2018-01-01 ENCOUNTER — Other Ambulatory Visit: Payer: Self-pay | Admitting: *Deleted

## 2018-01-01 MED ORDER — METOPROLOL SUCCINATE ER 25 MG PO TB24: ORAL_TABLET | ORAL | 0 refills | Status: DC

## 2018-01-01 NOTE — Telephone Encounter (Signed)
Medication has been sent in for a 3 month supply per pharmacy request.

## 2018-01-01 NOTE — Telephone Encounter (Signed)
Copied from [place] [id number]. Topic: General - Other >> Jan 01, 2018  3:26 PM Judyann Munson wrote: Reason for VQX:[facility] is requesting  authorization for  270 tablets for a 3 month supply.  Of the medication: metoprolol succinate (TOPROL-XL) 25 MG 24 hr tablet. Please advise  best contact number [phone]

## 2018-01-08 ENCOUNTER — Ambulatory Visit: Payer: Medicare Other

## 2018-01-14 ENCOUNTER — Other Ambulatory Visit: Payer: Self-pay | Admitting: Family Medicine

## 2018-01-16 ENCOUNTER — Ambulatory Visit: Payer: Medicare Other | Admitting: Oncology

## 2018-01-22 ENCOUNTER — Ambulatory Visit: Payer: Medicare Other | Admitting: General Practice

## 2018-01-22 DIAGNOSIS — Z7901 Long term (current) use of anticoagulants: Secondary | ICD-10-CM

## 2018-01-22 LAB — POCT INR: INR: 3.6 — AB (ref 2.0–3.0)

## 2018-01-22 NOTE — Patient Instructions (Addendum)
Pre visit review using our clinic review tool, if applicable. No additional management support is needed unless otherwise documented below in the visit note.  Hold coumadin today and then change dosage and take 1/2 tablet on Monday, Wednesdays and Fridays and Saturdays and 1 tablet all other days.  Re-check in 4 weeks.

## 2018-01-29 ENCOUNTER — Other Ambulatory Visit: Payer: Self-pay | Admitting: *Deleted

## 2018-01-29 MED ORDER — INSULIN DETEMIR 100 UNIT/ML FLEXPEN: 12.0000 [IU] | PEN_INJECTOR | Freq: Every day | SUBCUTANEOUS | 2 refills | Status: DC

## 2018-01-29 NOTE — Progress Notes (Signed)
[place]  [phone] Fax:([phone]     ID: LEOTHA WESTERMEYER DOB: [birth year]  MR#: [id number]  ERX#:[id number]  Patient Care Team: Eulas Post, MD as PCP - General (Family Medicine) Donato Heinz, MD as Consulting Physician (Nephrology) Magrinat, Virgie Dad, MD as Consulting Physician (Oncology) Jovita Kussmaul, MD as Consulting Physician (General Surgery) Delice Bison, Charlestine Massed, NP as Nurse Practitioner (Hematology and Oncology) Mcarthur Rossetti, MD as Consulting Physician (Orthopedic Surgery) Chauncey Cruel, MD OTHER MD:  CHIEF COMPLAINT: Ductal carcinoma in situ   CURRENT TREATMENT: Anastrozole, denosumab/Prolia   BREAST CANCER HISTORY: From the original intake note:  The patient had routine bilateral screening mammography with tomography at the [facility] 07/06/2016, showing new calcifications in the left breast. The right breast was unremarkable. Unilateral left diagnostic mammography 07/27/2016 found the breast density to be category B. In the upper inner quadrant of the left breast there was a 0.7 cm group of pleomorphic calcifications.  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  Her subsequent history is as detailed below  INTERVAL HISTORY: Neliah returns today for follow-up of her noninvasive breast cancer. She continues on anastrozole, with good tolerance. She denies issues with hot flashes. She denies changes in vaginal dryness.   She receives denosumab/ Prolia every 6 months, with a dose due today. She also tolerates this well. She denies having pain with the injection.   Since her last visit, she underwent diagnostic bilateral mammography with CAD and tomography on 10/30/2017 at [place] showing: breast density category B. There was no evidence of  malignancy.   REVIEW OF SYSTEMS: Amilliana reports that she feel outside in the yard in January. She broke her right hip, and had hip replacement surgery under Dr. Ninfa Linden. She completed rehabilitation. Her walking has improved and she walks with a cane. She denies unusual headaches, visual changes, nausea, vomiting, or dizziness. There has been no unusual cough, phlegm production, or pleurisy. This been no change in bowel or bladder habits. She denies unexplained fatigue or unexplained weight loss, bleeding, rash, or fever. A detailed review of systems was otherwise stable.   PAST MEDICAL HISTORY: Past Medical History:  Diagnosis Date  . Anemia   . Anemia   . Arthritis    gout  . Atrial fibrillation ([place])    prior Sotalol - d/c'd 2/2 increased creatinine; rate control strategy  . CAD (coronary artery disease)    s/p PCI in [place] in 1/09  . Cancer [facility])    left breast  . Chronic combined systolic and diastolic heart failure (HCC)    Echocardiogram 3/12: Mild LVH, EF 95-09%, normal diastolic function, mild AI, mild MR, PASP 44, normal wall motion  . CKD (chronic kidney disease)    sees Dr. Burman Foster - Stage 3 ?  Marland Kitchen Diabetes mellitus   . Hyperlipemia   . Hypertension   . MI, old    2009  . Osteoporosis   . Pacemaker   . Pleural effusion   . Pneumonia   . PUD (peptic ulcer disease) 10/2010   duodenal ulcer    PAST SURGICAL HISTORY: Past Surgical History:  Procedure Laterality Date  . BREAST BIOPSY Left 07/29/2016  . BREAST LUMPECTOMY Left 09/19/2016  . BREAST LUMPECTOMY WITH RADIOACTIVE SEED LOCALIZATION Left 09/19/2016   Procedure: LEFT BREAST LUMPECTOMY WITH RADIOACTIVE SEED LOCALIZATION;  Surgeon: Eddie Dibbles  Daiva Nakayama, MD;  Location: [place];  Service: General;  Laterality: Left;  . CATARACT EXTRACTION Bilateral    01/12/09 and 01/26/09 both eyes  . COLONOSCOPY    . CORONARY ANGIOPLASTY  2009  . PACEMAKER INSERTION  07/13/07  . stent implant  07/13/2007  . TOTAL HIP ARTHROPLASTY Right  07/23/2017   Procedure: TOTAL HIP ARTHROPLASTY ANTERIOR APPROACH;  Surgeon: Mcarthur Rossetti, MD;  Location: [place];  Service: Orthopedics;  Laterality: Right;    FAMILY HISTORY Family History  Problem Relation Age of Onset  . Heart disease Father   . Heart attack Father   . Clotting disorder Father        blood clot  . Hypertension Father   . Heart disease Mother   . Breast cancer Maternal Aunt 24  . Breast cancer Sister 73  . Breast cancer Other 40  . Colon cancer Neg Hx   The patient's father died at age 77 from a myocardial infarction. The patient's mother died from multiple problems including pleurisy at the age of 77 The patient has 3 brothers, 2 sisters. One sister was diagnosed with breast cancer in her late 63s. The other sister had a "vaginal cancer" (? Cervical cancer). A maternal aunt was diagnosed with breast cancer in her 75s and her daughter, the patient's niece was diagnosed with breast cancer in her 53s.  GYNECOLOGIC HISTORY:  No LMP recorded. Patient is postmenopausal. Menarche age 77, first live birth age 77, the patient is GX P1. She stopped having periods in 1992. She did not take hormone replacement. She took oral contraceptives briefly remotely with no complications.  SOCIAL HISTORY:  She is originally from [place]. 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 She is a widow. She lives with her cat, Olive Bass,  in a retirement community. Her daughter Kristie Cowman lives in [place]. She is a Runner, broadcasting/film/video. The patient has no grandchildren. She is not a church attender    ADVANCED DIRECTIVES: The patient's daughter Alleen Borne is her healthcare power of attorney. Alleen Borne can be reached at [phone] or [phone]   HEALTH MAINTENANCE: Social History   Tobacco Use  . Smoking status: Former Smoker    Packs/day: 2.00    Years: 20.00    Pack years: 40.00    Types: Cigarettes    Last attempt to quit: 11/26/1991    Years since quitting: 26.1  . Smokeless  tobacco: Never Used  Substance Use Topics  . Alcohol use: No  . Drug use: No     Colonoscopy:January 2017/ [facility]  PAP:  Bone density: March 2017 showed a T score of -2.7 (osteoporosis) right femoral neck   Allergies  Allergen Reactions  . Amlodipine Besylate Swelling    edema    Current Outpatient Medications  Medication Sig Dispense Refill  . anastrozole (ARIMIDEX) 1 MG tablet TAKE 1 TABLET BY MOUTH DAILY 90 tablet 3  . atorvastatin (LIPITOR) 40 MG tablet TAKE ONE TABLET BY MOUTH DAILY 90 tablet 0  . calcitRIOL (ROCALTROL) 0.25 MCG capsule Take 0.25 mcg by mouth every Monday, Wednesday, and Friday. In the morning.    . colchicine 0.6 MG tablet TAKE 1 TABLET (0.6 MG TOTAL) BY MOUTH AS NEEDED AS DIRECTED. 30 tablet 4  . Cranberry 500 MG CAPS Take 500 mg by mouth daily with lunch.    . feeding supplement, GLUCERNA SHAKE, (GLUCERNA SHAKE) LIQD Take 237 mLs by mouth 2 (two) times daily between meals.  0  . furosemide (LASIX)  40 MG tablet TAKE TWO TABLETS BY MOUTH EVERY MORNING AND TAKE ONE TABLET BY MOUTH EVERY EVENING 270 tablet 1  . glucose blood test strip 1 each by Other route 3 (three) times daily. Use as instructed     . Insulin Detemir (LEVEMIR FLEXTOUCH) 100 UNIT/ML Pen Inject 12 Units into the skin daily. 15 pen 2  . insulin detemir (LEVEMIR) 100 UNIT/ML injection Inject 12 Units into the skin at bedtime. And on sliding scale    . metolazone (ZAROXOLYN) 2.5 MG tablet Take 1 tablet (2.5 mg total) by mouth every other day. Take one tab Mon, Wed and Fri.  Take with your lasix.  Take an extra potassium 20 meq on these days 90 tablet 3  . metoprolol succinate (TOPROL-XL) 25 MG 24 hr tablet TAKE TWO TABLET BY MOUTH EVERY MORNING AND THEN ONE TABLET BY MOUTH EVERY EVENING 270 tablet 0  . Multiple Vitamin (MULTIVITAMIN WITH MINERALS) TABS tablet Take 1 tablet by mouth daily. FOR ADULT 50+    . Omega-3 Fatty Acids (FISH OIL) 1000 MG CAPS Take 1,000 mg by mouth 2 (two) times daily.    .  pantoprazole (PROTONIX) 40 MG tablet TAKE ONE TABLET BY MOUTH DAILY 30 tablet 5  . potassium chloride SA (K-DUR,KLOR-CON) 20 MEQ tablet TAKE TWO TABLETS BY MOUTH TWICE A DAY WITH FOOD AND WATER AS DIRECTED. Please make overdue appt with Dr. Lovena Le. 2nd attempt 60 tablet 10  . warfarin (COUMADIN) 5 MG tablet TAKE AS DIRECTED BY ANTICOAGULATION CLINIC 30 tablet 2   No current facility-administered medications for this visit.     OBJECTIVE: Elderly white woman who appears stated age  77:   01/30/18 1017  BP: 138/80  Pulse: 85  Resp: 18  Temp: (!) 97.5 F (36.4 C)  SpO2: 100%     Body mass index is 25.68 kg/m.    ECOG FS:1 - Symptomatic but completely ambulatory  Sclerae unicteric, pupils round and equal No cervical or supraclavicular adenopathy Lungs no rales or rhonchi Heart regular rate and rhythm Abd soft, nontender, positive bowel sounds MSK mild kyphosis but no focal spinal tenderness, no upper extremity lymphedema Neuro: nonfocal, well oriented, appropriate affect Breasts: Right breast is unremarkable.  The left breast is status post lumpectomy.  The cosmetic result is very good.  There is no evidence of disease recurrence.  Both axillae are benign.   LAB RESULTS:  CMP     Component Value Date/Time   NA 136 10/17/2017 1017   NA 134 (L) 04/18/2017 0950   K 4.2 10/17/2017 1017   K 3.4 (L) 04/18/2017 0950   CL 100 10/17/2017 1017   CO2 23 10/17/2017 1017   CO2 26 04/18/2017 0950   GLUCOSE 125 10/17/2017 1017   GLUCOSE 139 04/18/2017 0950   BUN 48 (H) 10/17/2017 1017   BUN 53.9 (H) 04/18/2017 0950   CREATININE 2.56 (H) 10/17/2017 1017   CREATININE 2.4 (H) 04/18/2017 0950   CALCIUM 10.0 10/17/2017 1017   CALCIUM 9.1 04/18/2017 0950   PROT 7.7 12/13/2017 1009   PROT 7.4 04/18/2017 0950   ALBUMIN 4.4 12/13/2017 1009   ALBUMIN 3.4 (L) 04/18/2017 0950   AST 21 12/13/2017 1009   AST 28 04/18/2017 0950   ALT 14 12/13/2017 1009   ALT 18 04/18/2017 0950   ALKPHOS  53 12/13/2017 1009   ALKPHOS 42 04/18/2017 0950   BILITOT 0.6 12/13/2017 1009   BILITOT 0.55 04/18/2017 0950   GFRNONAA 17 (L) 10/17/2017 1017  GFRAA 20 (L) 10/17/2017 1017    INo results found for: SPEP, UPEP  Lab Results  Component Value Date   WBC 6.8 10/17/2017   NEUTROABS 4.8 10/17/2017   HGB 11.7 10/17/2017   HCT 35.1 10/17/2017   MCV 89.1 10/17/2017   PLT 199 10/17/2017      Chemistry      Component Value Date/Time   NA 136 10/17/2017 1017   NA 134 (L) 04/18/2017 0950   K 4.2 10/17/2017 1017   K 3.4 (L) 04/18/2017 0950   CL 100 10/17/2017 1017   CO2 23 10/17/2017 1017   CO2 26 04/18/2017 0950   BUN 48 (H) 10/17/2017 1017   BUN 53.9 (H) 04/18/2017 0950   CREATININE 2.56 (H) 10/17/2017 1017   CREATININE 2.4 (H) 04/18/2017 0950   GLU 103 10/05/2016      Component Value Date/Time   CALCIUM 10.0 10/17/2017 1017   CALCIUM 9.1 04/18/2017 0950   ALKPHOS 53 12/13/2017 1009   ALKPHOS 42 04/18/2017 0950   AST 21 12/13/2017 1009   AST 28 04/18/2017 0950   ALT 14 12/13/2017 1009   ALT 18 04/18/2017 0950   BILITOT 0.6 12/13/2017 1009   BILITOT 0.55 04/18/2017 0950       No results found for: LABCA2  No components found for: YQMVH846  No results for input(s): INR in the last 168 hours.  Urinalysis    Component Value Date/Time   COLORURINE LT. YELLOW 07/01/2011 [place] 07/01/2011 1203   LABSPEC 1.010 07/01/2011 1203   PHURINE 6.0 07/01/2011 1203   GLUCOSEU NEGATIVE 07/01/2011 1203   HGBUR TRACE-LYSED 07/01/2011 1203   BILIRUBINUR neg 05/02/2013 1215   KETONESUR NEGATIVE 07/01/2011 1203   PROTEINUR 1 05/02/2013 1215   PROTEINUR NEGATIVE 09/15/2010 0449   UROBILINOGEN 0.2 05/02/2013 1215   UROBILINOGEN 0.2 07/01/2011 1203   NITRITE neg 05/02/2013 1215   NITRITE NEGATIVE 07/01/2011 1203   LEUKOCYTESUR moderate (2+) 05/02/2013 1215     STUDIES: Since her last visit, she underwent diagnostic bilateral mammography with CAD and  tomography on 10/30/2017 at [facility] showing: breast density category B. There was no evidence of malignancy.   ELIGIBLE FOR AVAILABLE RESEARCH PROTOCOL: COMET/ discussed 08/23/2016; patient declines participation  ASSESSMENT: 77 y.o. [place] woman status post left breast upper inner quadrant biopsy 07/29/2016 for ductal carcinoma in situ, grade 2, estrogen and progesterone receptor positive  (1) Genetics testing 08/23/2016 through the Multi-Gene Panel offered by [facility] found no deleterious mutations in ALK, APC, ATM, AXIN2,BAP1,  BARD1, BLM, BMPR1A, BRCA1, BRCA2, BRIP1, CASR, CDC73, CDH1, CDK4, CDKN1B, CDKN1C, CDKN2A (p14ARF), CDKN2A (p16INK4a), CEBPA, CHEK2, DICER1, CIS3L2, EGFR (c.2369C>T, p.Thr790Met variant only), EPCAM (Deletion/duplication testing only), FH, FLCN, GATA2, GPC3, GREM1 (Promoter region deletion/duplication testing only), HOXB13 (c.251G>A, p.Gly84Glu), HRAS, KIT, MAX, MEN1, MET, MITF (c.952G>A, p.Glu318Lys variant only), MLH1, MSH2, MSH6, MUTYH, NBN, NF1, NF2, PALB2, PDGFRA, PHOX2B, PMS2, POLD1, POLE, POT1, PRKAR1A, PTCH1, PTEN, RAD50, RAD51C, RAD51D, RB1, RECQL4, RET, RUNX1, SDHAF2, SDHA (sequence changes only), SDHB, SDHC, SDHD, SMAD4, SMARCA4, SMARCB1, SMARCE1, STK11, SUFU, TERT, TERT, TMEM127, TP53, TSC1, TSC2, VHL, WRN and WT1  (2) considered the COMET trial: patient declines  (3) left lumpectomy 09/19/2016 found ductal carcinoma in situ, grade 2, measuring 0.6 cm, with negative margins.  (4) advised against adjuvant radiation since there would be no survival advantage in this setting and the patient's pacemaker would have to be moved  (5) anastrozole started 09/27/2016  (a) bone density 08/25/2015 found a T  score of -2.7, consistent with osteoporosis   (b) denosumab/Prolia started 10/25/2016, repeated every 24 weeks  PLAN: Keyarra is now a year and a half out from definitive surgery for her noninvasive breast cancer.  There is no evidence of disease activity or  recurrence.  This is favorable.  She is tolerating anastrozole well.  The plan of course is to continue that for a total of 5 years.  The one concern of course is bone density loss.  She is on Prolia for that, with excellent tolerance  She will return in 6 months for her next Prolia shot and then to see me again a year from now.  She knows to call for any other issues that may develop before the next visit.  Magrinat, Virgie Dad, MD  01/30/18 10:48 AM Medical Oncology and Hematology [facility] [address] [place], [place] [address] Tel. [phone]    Fax. [phone]  Alice Rieger, am acting as scribe for Chauncey Cruel MD.  I, Lurline Del MD, have reviewed the above documentation for accuracy and completeness, and I agree with the above.

## 2018-01-30 ENCOUNTER — Telehealth: Payer: Self-pay | Admitting: Oncology

## 2018-01-30 ENCOUNTER — Inpatient Hospital Stay: Payer: Medicare Other | Attending: Oncology | Admitting: Oncology

## 2018-01-30 VITALS — BP 138/80 | HR 85 | Temp 97.5°F | Resp 18 | Ht 63.5 in

## 2018-01-30 DIAGNOSIS — M810 Age-related osteoporosis without current pathological fracture: Secondary | ICD-10-CM | POA: Insufficient documentation

## 2018-01-30 DIAGNOSIS — Z170 Estrogen receptor positive status [ER+]: Secondary | ICD-10-CM | POA: Diagnosis not present

## 2018-01-30 DIAGNOSIS — Z87891 Personal history of nicotine dependence: Secondary | ICD-10-CM | POA: Diagnosis not present

## 2018-01-30 DIAGNOSIS — D0512 Intraductal carcinoma in situ of left breast: Secondary | ICD-10-CM | POA: Diagnosis present

## 2018-01-30 DIAGNOSIS — M818 Other osteoporosis without current pathological fracture: Secondary | ICD-10-CM

## 2018-01-30 DIAGNOSIS — Z9181 History of falling: Secondary | ICD-10-CM | POA: Diagnosis not present

## 2018-01-30 DIAGNOSIS — Z96641 Presence of right artificial hip joint: Secondary | ICD-10-CM

## 2018-01-30 NOTE — Telephone Encounter (Signed)
Per 8/6 los.  Verified orders w/ nurse.  Schedlued 6 months out from last Prolia.  No Prolia today.  Gave patient avs and schedule.

## 2018-02-11 ENCOUNTER — Other Ambulatory Visit: Payer: Self-pay | Admitting: Family Medicine

## 2018-02-15 ENCOUNTER — Ambulatory Visit (INDEPENDENT_AMBULATORY_CARE_PROVIDER_SITE_OTHER): Payer: Medicare Other | Admitting: *Deleted

## 2018-02-15 DIAGNOSIS — I482 Chronic atrial fibrillation, unspecified: Secondary | ICD-10-CM

## 2018-02-15 DIAGNOSIS — I5042 Chronic combined systolic (congestive) and diastolic (congestive) heart failure: Secondary | ICD-10-CM

## 2018-02-16 ENCOUNTER — Encounter: Payer: Self-pay | Admitting: Cardiology

## 2018-02-16 NOTE — Progress Notes (Signed)
Remote pacemaker transmission.   

## 2018-02-19 ENCOUNTER — Ambulatory Visit: Payer: Medicare Other | Admitting: General Practice

## 2018-02-19 DIAGNOSIS — Z7901 Long term (current) use of anticoagulants: Secondary | ICD-10-CM

## 2018-02-19 LAB — POCT INR: INR: 4.2 — AB (ref 2.0–3.0)

## 2018-02-19 NOTE — Patient Instructions (Signed)
Pre visit review using our clinic review tool, if applicable. No additional management support is needed unless otherwise documented below in the visit note.  Hold coumadin today and tomorrow and then  continue to take 1/2 tablet on Monday, Wednesdays and Fridays and Saturdays and 1 tablet all other days.  Re-check in 3 weeks.

## 2018-02-20 ENCOUNTER — Encounter: Payer: Self-pay | Admitting: Family Medicine

## 2018-03-07 ENCOUNTER — Encounter (INDEPENDENT_AMBULATORY_CARE_PROVIDER_SITE_OTHER): Payer: Self-pay | Admitting: Orthopaedic Surgery

## 2018-03-07 ENCOUNTER — Ambulatory Visit (INDEPENDENT_AMBULATORY_CARE_PROVIDER_SITE_OTHER): Payer: Medicare Other | Admitting: Orthopaedic Surgery

## 2018-03-07 ENCOUNTER — Ambulatory Visit (INDEPENDENT_AMBULATORY_CARE_PROVIDER_SITE_OTHER): Payer: Self-pay

## 2018-03-07 DIAGNOSIS — Z96641 Presence of right artificial hip joint: Secondary | ICD-10-CM

## 2018-03-07 NOTE — Progress Notes (Signed)
Office Visit Note   Patient: Yolanda Huffman           Date of Birth: 08-24-1940           MRN: [id number] Visit Date: 03/07/2018              Requested by: Eulas Post, MD [place], [place] [address] PCP: Eulas Post, MD   Assessment & Plan: Visit Diagnoses:  1. History of right hip replacement     Plan:  Have her follow-up on an as-needed basis in regards to the hip.  She is activities as tolerated.  Questions encouraged and answered.  Follow-Up Instructions: Return if symptoms worsen or fail to improve.   Orders:  Orders Placed This Encounter  Procedures  . XR HIP UNILAT W OR W/O PELVIS 1V RIGHT   No orders of the defined types were placed in this encounter.     Procedures: No procedures performed   Clinical Data: No additional findings.   Subjective: Chief Complaint  Patient presents with  . Right Hip - Follow-up    HPI Yolanda Huffman is a 77 year old female who is now 7 months status post right total hip arthroplasty which she had due to a right hip fracture.  She states overall she is doing well.  She has no pain in regards to the hip.  She states she overall has good range of motion and hip and has no concerns. Review of Systems Please see HPI otherwise negative  Objective: Vital Signs: There were no vitals taken for this visit.  Physical Exam General: Well-developed well-nourished female no acute distress mood and affect appropriate. Psych: Alert and oriented x3 Ortho Exam Ambulates with a cane in right hand.  Nonantalgic gait.  Good range of motion right hip without pain.  Right calf supple nontender. Specialty Comments:  No specialty comments available.  Imaging: Xr Hip Unilat W Or W/o Pelvis 1v Right  Result Date: 03/07/2018 AP pelvis and lateral view right hip: No acute fracture.  Bilateral hips well located.  Right total hip arthroplasty components well-seated no signs of complication.     PMFS  History: Patient Active Problem List   Diagnosis Date Noted  . Status post right hip replacement 09/04/2017  . Chronic combined systolic and diastolic CHF (congestive heart failure) ([place]) 07/25/2017  . Closed displaced fracture of right femoral neck ([place]) 07/22/2017  . Long term (current) use of anticoagulants 03/08/2017  . Genetic testing 09/07/2016  . 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 Gout 11/03/2015  . Personal history of colonic polyps 04/15/2015  . Chronic anticoagulation 04/15/2015  . Insulin dependent diabetes mellitus ([place]) 04/15/2015  . Encounter for therapeutic drug monitoring 08/01/2013  . CKD (chronic kidney disease) 06/14/2011  . Pleural effusion 06/10/2011  . DOE (dyspnea on exertion) 06/10/2011  . Edema 11/30/2010  . Duodenal ulcer 11/12/2010  . Pneumonia 11/12/2010  . Anemia associated with acute blood loss 08/12/2010  . BLOOD IN STOOL 08/06/2010  . ATRIAL FIBRILLATION 03/24/2010  . 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 Hyperlipidemia 02/23/2010  . Essential hypertension 02/23/2010  . Coronary atherosclerosis 02/23/2010  . UTI'S, RECURRENT 02/23/2010  . Osteoporosis 02/23/2010  . Cardiac pacemaker in situ 02/23/2010   Past Medical History:  Diagnosis Date  . Anemia   . Anemia   . Arthritis    gout  . Atrial fibrillation ([place])    prior Sotalol -  d/c'd 2/2 increased creatinine; rate control strategy  . CAD (coronary artery disease)    s/p PCI in [place] in 1/09  . Cancer [facility])    left breast  . Chronic combined systolic and diastolic heart failure (HCC)    Echocardiogram 3/12: Mild LVH, EF 35-82%, normal diastolic function, mild AI, mild MR, PASP 44, normal wall motion  . CKD (chronic kidney disease)    sees Dr. Burman Foster - Stage 3 ?  Marland Kitchen Diabetes mellitus   . Hyperlipemia   . Hypertension   . MI, old    2009  . Osteoporosis   . Pacemaker   . Pleural effusion   . Pneumonia   . PUD (peptic ulcer  disease) 10/2010   duodenal ulcer    Family History  Problem Relation Age of Onset  . Heart disease Father   . Heart attack Father   . Clotting disorder Father        blood clot  . Hypertension Father   . Heart disease Mother   . Breast cancer Maternal Aunt 90  . Breast cancer Sister 26  . Breast cancer Other 40  . Colon cancer Neg Hx     Past Surgical History:  Procedure Laterality Date  . BREAST BIOPSY Left 07/29/2016  . BREAST LUMPECTOMY Left 09/19/2016  . BREAST LUMPECTOMY WITH RADIOACTIVE SEED LOCALIZATION Left 09/19/2016   Procedure: LEFT BREAST LUMPECTOMY WITH RADIOACTIVE SEED LOCALIZATION;  Surgeon: Autumn Messing III, MD;  Location: [facility];  Service: General;  Laterality: Left;  . CATARACT EXTRACTION Bilateral    01/12/09 and 01/26/09 both eyes  . COLONOSCOPY    . CORONARY ANGIOPLASTY  2009  . PACEMAKER INSERTION  07/13/07  . stent implant  07/13/2007  . TOTAL HIP ARTHROPLASTY Right 07/23/2017   Procedure: TOTAL HIP ARTHROPLASTY ANTERIOR APPROACH;  Surgeon: Mcarthur Rossetti, MD;  Location: [place] [facility] [facility];  Service: Orthopedics;  Laterality: Right;   Social History   Occupational History  . Occupation: retired Theatre stage manager: RETIRED  Tobacco Use  . Smoking status: Former Smoker    Packs/day: 2.00    Years: 20.00    Pack years: 40.00    Types: Cigarettes    Last attempt to quit: 11/26/1991    Years since quitting: 26.2  . Smokeless tobacco: Never Used  Substance and Sexual Activity  . Alcohol use: No  . Drug use: No  . Sexual activity: Not on file

## 2018-03-12 ENCOUNTER — Ambulatory Visit: Payer: Medicare Other | Admitting: General Practice

## 2018-03-12 DIAGNOSIS — Z7901 Long term (current) use of anticoagulants: Secondary | ICD-10-CM

## 2018-03-12 LAB — POCT INR: INR: 2.6 (ref 2.0–3.0)

## 2018-03-12 NOTE — Patient Instructions (Addendum)
Pre visit review using our clinic review tool, if applicable. No additional management support is needed unless otherwise documented below in the visit note.  Continue to take 1/2 tablet on Monday, Wednesdays and Fridays and Saturdays and 1 tablet all other days.  Re-check in 4 weeks.

## 2018-03-13 ENCOUNTER — Other Ambulatory Visit: Payer: Self-pay | Admitting: Family Medicine

## 2018-03-13 DIAGNOSIS — I5042 Chronic combined systolic (congestive) and diastolic (congestive) heart failure: Secondary | ICD-10-CM

## 2018-03-14 NOTE — Telephone Encounter (Signed)
Continue same dose.  Refill for one year.

## 2018-03-14 NOTE — Telephone Encounter (Signed)
Last OV 12/13/17, Next OV 06/15/18  Last filled by Dr. Crissie Sickles  Please advise if same dosage and instructions.

## 2018-03-15 ENCOUNTER — Other Ambulatory Visit: Payer: Self-pay | Admitting: Family Medicine

## 2018-03-16 ENCOUNTER — Encounter: Payer: Self-pay | Admitting: Internal Medicine

## 2018-03-16 ENCOUNTER — Ambulatory Visit (INDEPENDENT_AMBULATORY_CARE_PROVIDER_SITE_OTHER): Payer: Medicare Other | Admitting: Internal Medicine

## 2018-03-16 VITALS — BP 126/68 | HR 72 | Ht 63.0 in | Wt 147.0 lb

## 2018-03-16 DIAGNOSIS — I482 Chronic atrial fibrillation, unspecified: Secondary | ICD-10-CM

## 2018-03-16 DIAGNOSIS — R5383 Other fatigue: Secondary | ICD-10-CM

## 2018-03-16 DIAGNOSIS — R0602 Shortness of breath: Secondary | ICD-10-CM

## 2018-03-16 DIAGNOSIS — I495 Sick sinus syndrome: Secondary | ICD-10-CM

## 2018-03-16 DIAGNOSIS — Z950 Presence of cardiac pacemaker: Secondary | ICD-10-CM

## 2018-03-16 DIAGNOSIS — I5042 Chronic combined systolic (congestive) and diastolic (congestive) heart failure: Secondary | ICD-10-CM | POA: Diagnosis not present

## 2018-03-16 NOTE — Patient Instructions (Addendum)
Medication Instructions:  Your physician recommends that you continue on your current medications as directed. Please refer to the Current Medication list given to you today.  Labwork: None ordered.  Testing/Procedures: Your physician has requested that you have an echocardiogram. Echocardiography is a painless test that uses sound waves to create images of your heart. It provides your doctor with information about the size and shape of your heart and how well your heart's chambers and valves are working. This procedure takes approximately one hour. There are no restrictions for this procedure.  Please schedule for a ECHO  Follow-Up: Your physician wants you to follow-up in: one year with Dr. Lovena Le.   You will receive a reminder letter in the mail two months in advance. If you don't receive a letter, please call our office to schedule the follow-up appointment.  Remote monitoring is used to monitor your Pacemaker from home. This monitoring reduces the number of office visits required to check your device to one time per year. It allows [place] to keep an eye on the functioning of your device to ensure it is working properly. You are scheduled for a device check from home on 05/18/2018. You may send your transmission at any time that day. If you have a wireless device, the transmission will be sent automatically. After your physician reviews your transmission, you will receive a postcard with your next transmission date.  Any Other Special Instructions Will Be Listed Below (If Applicable).  If you need a refill on your cardiac medications before your next appointment, please call your pharmacy.

## 2018-03-16 NOTE — Progress Notes (Signed)
HPI [name] returns today for follow-up. She is a very pleasant 77 year old woman with a history of sinus node dysfunction, and atrial fibrillation, status post permanent pacemaker insertion. She has done well in the interim. The patient denies chest pain. No peripheral edema. She is sedentary. She has not had syncope. She does not experience palpitations. Her sodium intake has improved. She notes some fatigue and weakness.  Allergies  Allergen Reactions  . Amlodipine Besylate Swelling    edema     Current Outpatient Medications  Medication Sig Dispense Refill  . anastrozole (ARIMIDEX) 1 MG tablet TAKE 1 TABLET BY MOUTH DAILY 90 tablet 3  . atorvastatin (LIPITOR) 40 MG tablet TAKE ONE TABLET BY MOUTH DAILY 90 tablet 0  . calcitRIOL (ROCALTROL) 0.25 MCG capsule Take 0.25 mcg by mouth every Monday, Wednesday, and Friday. In the morning.    . colchicine 0.6 MG tablet TAKE 1 TABLET (0.6 MG TOTAL) BY MOUTH AS NEEDED AS DIRECTED. 30 tablet 4  . Cranberry 500 MG CAPS Take 500 mg by mouth daily with lunch.    . feeding supplement, GLUCERNA SHAKE, (GLUCERNA SHAKE) LIQD Take 237 mLs by mouth 2 (two) times daily between meals.  0  . furosemide (LASIX) 40 MG tablet TAKE TWO TABLETS BY MOUTH EVERY MORNING AND TAKE ONE TABLET BY MOUTH EVERY EVENING 270 tablet 1  . glucose blood test strip 1 each by Other route 3 (three) times daily. Use as instructed     . Insulin Detemir (LEVEMIR FLEXTOUCH) 100 UNIT/ML Pen Inject 12 Units into the skin daily. 15 pen 2  . insulin detemir (LEVEMIR) 100 UNIT/ML injection Inject 12 Units into the skin at bedtime. And on sliding scale    . metolazone (ZAROXOLYN) 2.5 MG tablet TAKE 1 TABLET BY MOUTH EVERY MONDAY, WEDNESDAY, AND FRIDAY IN THE MORNING 36 tablet 0  . metoprolol succinate (TOPROL-XL) 25 MG 24 hr tablet TAKE TWO TABLET BY MOUTH EVERY MORNING AND THEN ONE TABLET BY MOUTH EVERY EVENING 270 tablet 0  . Multiple Vitamin (MULTIVITAMIN WITH MINERALS) TABS  tablet Take 1 tablet by mouth daily. FOR ADULT 50+    . Omega-3 Fatty Acids (FISH OIL) 1000 MG CAPS Take 1,000 mg by mouth 2 (two) times daily.    . pantoprazole (PROTONIX) 40 MG tablet TAKE ONE TABLET BY MOUTH DAILY 30 tablet 5  . potassium chloride SA (K-DUR,KLOR-CON) 20 MEQ tablet TAKE TWO TABLETS BY MOUTH TWICE A DAY WITH FOOD AND WATER AS DIRECTED. Please make overdue appt with Dr. Lovena Le. 2nd attempt 60 tablet 10  . warfarin (COUMADIN) 5 MG tablet Take 1/2 tablet daily except 1 on sun Tues Thurs or TAKE AS DIRECTED BY ANTICOAGULATION CLINIC 30 tablet 3   No current facility-administered medications for this visit.      Past Medical History:  Diagnosis Date  . Anemia   . Anemia   . Arthritis    gout  . Atrial fibrillation ([place])    prior Sotalol - d/c'd 2/2 increased creatinine; rate control strategy  . CAD (coronary artery disease)    s/p PCI in [place] in 1/09  . Cancer [facility])    left breast  . Chronic combined systolic and diastolic heart failure (HCC)    Echocardiogram 3/12: Mild LVH, EF 73-71%, normal diastolic function, mild AI, mild MR, PASP 44, normal wall motion  . CKD (chronic kidney disease)    sees Dr. Burman Foster - Stage 3 ?  Marland Kitchen Diabetes mellitus   .  Hyperlipemia   . Hypertension   . MI, old    2009  . Osteoporosis   . Pacemaker   . Pleural effusion   . Pneumonia   . PUD (peptic ulcer disease) 10/2010   duodenal ulcer    ROS:   All systems reviewed and negative except as noted in the HPI.   Past Surgical History:  Procedure Laterality Date  . BREAST BIOPSY Left 07/29/2016  . BREAST LUMPECTOMY Left 09/19/2016  . BREAST LUMPECTOMY WITH RADIOACTIVE SEED LOCALIZATION Left 09/19/2016   Procedure: LEFT BREAST LUMPECTOMY WITH RADIOACTIVE SEED LOCALIZATION;  Surgeon: Autumn Messing III, MD;  Location: [place];  Service: General;  Laterality: Left;  . CATARACT EXTRACTION Bilateral    01/12/09 and 01/26/09 both eyes  . COLONOSCOPY    . CORONARY ANGIOPLASTY  2009  .  PACEMAKER INSERTION  07/13/07  . stent implant  07/13/2007  . TOTAL HIP ARTHROPLASTY Right 07/23/2017   Procedure: TOTAL HIP ARTHROPLASTY ANTERIOR APPROACH;  Surgeon: Mcarthur Rossetti, MD;  Location: [place];  Service: Orthopedics;  Laterality: Right;     Family History  Problem Relation Age of Onset  . Heart disease Father   . Heart attack Father   . Clotting disorder Father        blood clot  . Hypertension Father   . Heart disease Mother   . Breast cancer Maternal Aunt 72  . Breast cancer Sister 13  . Breast cancer Other 40  . Colon cancer Neg Hx      Social History   Socioeconomic History  . Marital status: Widowed    Spouse name: Not on file  . Number of children: 1  . Years of education: Not on file  . Highest education level: Not on file  Occupational History  . Occupation: retired Theatre stage manager: RETIRED  Social Needs  . Financial resource strain: Not on file  . Food insecurity:    Worry: Not on file    Inability: Not on file  . Transportation needs:    Medical: Not on file    Non-medical: Not on file  Tobacco Use  . Smoking status: Former Smoker    Packs/day: 2.00    Years: 20.00    Pack years: 40.00    Types: Cigarettes    Last attempt to quit: 11/26/1991    Years since quitting: 26.3  . Smokeless tobacco: Never Used  Substance and Sexual Activity  . Alcohol use: No  . Drug use: No  . Sexual activity: Not on file  Lifestyle  . Physical activity:    Days per week: Not on file    Minutes per session: Not on file  . Stress: Not on file  Relationships  . Social connections:    Talks on phone: Not on file    Gets together: Not on file    Attends religious service: Not on file    Active member of club or organization: Not on file    Attends meetings of clubs or organizations: Not on file    Relationship status: Not on file  . Intimate partner violence:    Fear of current or ex partner: Not on file    Emotionally abused: Not on file     Physically abused: Not on file    Forced sexual activity: Not on file  Other Topics Concern  . Not on file  Social History Narrative   Reg exercise  BP 126/68   Pulse 72   Ht 5\' 3"  (1.6 m)   Wt 147 lb (66.7 kg)   SpO2 97%   BMI 26.04 kg/m   Physical Exam:  Well appearing NAD HEENT: Unremarkable Neck:  No JVD, no thyromegally Lymphatics:  No adenopathy Back:  No CVA tenderness Lungs:  Clear HEART:  Regular rate rhythm, no murmurs, no rubs, no clicks Abd:  soft, positive bowel sounds, no organomegally, no rebound, no guarding Ext:  2 plus pulses, no edema, no cyanosis, no clubbing Skin:  No rashes no nodules Neuro:  CN II through XII intact, motor grossly intact  EKG - none  DEVICE  Normal device function.  See PaceArt for details.   Assess/Plan: 1. Worsening fatigue and sob - she has been rv pacing for some time. I will ask her to undergo a 2D echo to look for PM induced LV dysfunction. 2. Atrial fib - her ventricular rate is well controlled. No change in meds. 3. PPM - she is approaching ERI in a couple of months. Her medtronic device is working normally.  Mikle Bosworth.D.

## 2018-03-22 LAB — CUP PACEART REMOTE DEVICE CHECK
Date Time Interrogation Session: 20190822093247
Implantable Lead Location: 753860
Lead Channel Impedance Value: 410 Ohm
Lead Channel Pacing Threshold Amplitude: 1.125 V
Lead Channel Setting Pacing Amplitude: 2.5 V
MDC IDC LEAD IMPLANT DT: 20090116
MDC IDC LEAD IMPLANT DT: 20090116
MDC IDC LEAD LOCATION: 753859
MDC IDC MSMT BATTERY IMPEDANCE: 5125 Ohm
MDC IDC MSMT BATTERY REMAINING LONGEVITY: 8 mo
MDC IDC MSMT BATTERY VOLTAGE: 2.68 V
MDC IDC MSMT LEADCHNL RA IMPEDANCE VALUE: 67 Ohm
MDC IDC MSMT LEADCHNL RV PACING THRESHOLD PULSEWIDTH: 0.4 ms
MDC IDC PG IMPLANT DT: 20090116
MDC IDC SET LEADCHNL RV PACING PULSEWIDTH: 0.4 ms
MDC IDC SET LEADCHNL RV SENSING SENSITIVITY: 2 mV
MDC IDC STAT BRADY RV PERCENT PACED: 99 %

## 2018-03-28 ENCOUNTER — Other Ambulatory Visit: Payer: Self-pay | Admitting: Internal Medicine

## 2018-03-30 ENCOUNTER — Other Ambulatory Visit: Payer: Self-pay

## 2018-03-30 MED ORDER — METOPROLOL SUCCINATE ER 25 MG PO TB24: ORAL_TABLET | ORAL | 0 refills | Status: DC

## 2018-04-05 ENCOUNTER — Ambulatory Visit (HOSPITAL_COMMUNITY): Payer: Medicare Other | Attending: Internal Medicine

## 2018-04-05 ENCOUNTER — Other Ambulatory Visit: Payer: Self-pay

## 2018-04-05 DIAGNOSIS — R0602 Shortness of breath: Secondary | ICD-10-CM

## 2018-04-05 DIAGNOSIS — R5383 Other fatigue: Secondary | ICD-10-CM

## 2018-04-09 ENCOUNTER — Ambulatory Visit: Payer: Medicare Other | Admitting: General Practice

## 2018-04-09 DIAGNOSIS — Z7901 Long term (current) use of anticoagulants: Secondary | ICD-10-CM

## 2018-04-09 LAB — POCT INR: INR: 2.3 (ref 2.0–3.0)

## 2018-04-09 NOTE — Patient Instructions (Addendum)
Pre visit review using our clinic review tool, if applicable. No additional management support is needed unless otherwise documented below in the visit note.  Continue to take 1/2 tablet on Monday, Wednesdays and Fridays and Saturdays and 1 tablet all other days.  Re-check in 4 weeks.

## 2018-04-12 ENCOUNTER — Telehealth: Payer: Self-pay | Admitting: Internal Medicine

## 2018-04-12 ENCOUNTER — Other Ambulatory Visit: Payer: Self-pay | Admitting: Family Medicine

## 2018-04-12 NOTE — Telephone Encounter (Signed)
New Message       Patient's daughter Becky Sax) has questions about her mother's lab results. She is on the DPR would like a call back concerning this matter.

## 2018-04-17 ENCOUNTER — Inpatient Hospital Stay: Payer: Medicare Other | Attending: Oncology

## 2018-04-17 ENCOUNTER — Telehealth: Payer: Self-pay | Admitting: Medical

## 2018-04-17 ENCOUNTER — Inpatient Hospital Stay: Payer: Medicare Other

## 2018-04-17 ENCOUNTER — Inpatient Hospital Stay (HOSPITAL_BASED_OUTPATIENT_CLINIC_OR_DEPARTMENT_OTHER): Payer: Medicare Other | Admitting: Medical

## 2018-04-17 ENCOUNTER — Encounter (HOSPITAL_COMMUNITY): Payer: Self-pay | Admitting: Emergency Medicine

## 2018-04-17 ENCOUNTER — Observation Stay (HOSPITAL_COMMUNITY)
Admission: EM | Admit: 2018-04-17 | Discharge: 2018-04-19 | Disposition: A | Discharge status: Home / Self Care | Payer: Medicare Other | Attending: Internal Medicine | Admitting: Internal Medicine

## 2018-04-17 ENCOUNTER — Other Ambulatory Visit: Payer: Self-pay

## 2018-04-17 VITALS — BP 113/68 | HR 90 | Temp 98.2°F | Resp 18 | Ht 63.0 in | Wt 149.8 lb

## 2018-04-17 DIAGNOSIS — Z96641 Presence of right artificial hip joint: Secondary | ICD-10-CM | POA: Diagnosis not present

## 2018-04-17 DIAGNOSIS — N184 Chronic kidney disease, stage 4 (severe): Secondary | ICD-10-CM | POA: Insufficient documentation

## 2018-04-17 DIAGNOSIS — D0512 Intraductal carcinoma in situ of left breast: Secondary | ICD-10-CM

## 2018-04-17 DIAGNOSIS — Z950 Presence of cardiac pacemaker: Secondary | ICD-10-CM | POA: Diagnosis not present

## 2018-04-17 DIAGNOSIS — I2510 Atherosclerotic heart disease of native coronary artery without angina pectoris: Secondary | ICD-10-CM | POA: Diagnosis not present

## 2018-04-17 DIAGNOSIS — M1990 Unspecified osteoarthritis, unspecified site: Secondary | ICD-10-CM | POA: Insufficient documentation

## 2018-04-17 DIAGNOSIS — M810 Age-related osteoporosis without current pathological fracture: Secondary | ICD-10-CM | POA: Insufficient documentation

## 2018-04-17 DIAGNOSIS — Z8249 Family history of ischemic heart disease and other diseases of the circulatory system: Secondary | ICD-10-CM | POA: Insufficient documentation

## 2018-04-17 DIAGNOSIS — N179 Acute kidney failure, unspecified: Secondary | ICD-10-CM

## 2018-04-17 DIAGNOSIS — I482 Chronic atrial fibrillation, unspecified: Secondary | ICD-10-CM | POA: Diagnosis not present

## 2018-04-17 DIAGNOSIS — I5043 Acute on chronic combined systolic (congestive) and diastolic (congestive) heart failure: Secondary | ICD-10-CM

## 2018-04-17 DIAGNOSIS — Z87891 Personal history of nicotine dependence: Secondary | ICD-10-CM | POA: Diagnosis not present

## 2018-04-17 DIAGNOSIS — M818 Other osteoporosis without current pathological fracture: Secondary | ICD-10-CM | POA: Diagnosis not present

## 2018-04-17 DIAGNOSIS — M109 Gout, unspecified: Secondary | ICD-10-CM | POA: Diagnosis not present

## 2018-04-17 DIAGNOSIS — D509 Iron deficiency anemia, unspecified: Secondary | ICD-10-CM

## 2018-04-17 DIAGNOSIS — I252 Old myocardial infarction: Secondary | ICD-10-CM | POA: Diagnosis not present

## 2018-04-17 DIAGNOSIS — E1122 Type 2 diabetes mellitus with diabetic chronic kidney disease: Secondary | ICD-10-CM | POA: Diagnosis not present

## 2018-04-17 DIAGNOSIS — D649 Anemia, unspecified: Secondary | ICD-10-CM

## 2018-04-17 DIAGNOSIS — D500 Iron deficiency anemia secondary to blood loss (chronic): Secondary | ICD-10-CM | POA: Diagnosis not present

## 2018-04-17 DIAGNOSIS — IMO0001 Reserved for inherently not codable concepts without codable children: Secondary | ICD-10-CM

## 2018-04-17 DIAGNOSIS — E785 Hyperlipidemia, unspecified: Secondary | ICD-10-CM | POA: Diagnosis not present

## 2018-04-17 DIAGNOSIS — I130 Hypertensive heart and chronic kidney disease with heart failure and stage 1 through stage 4 chronic kidney disease, or unspecified chronic kidney disease: Secondary | ICD-10-CM | POA: Insufficient documentation

## 2018-04-17 DIAGNOSIS — I5042 Chronic combined systolic (congestive) and diastolic (congestive) heart failure: Secondary | ICD-10-CM | POA: Diagnosis present

## 2018-04-17 DIAGNOSIS — Z7901 Long term (current) use of anticoagulants: Secondary | ICD-10-CM | POA: Diagnosis not present

## 2018-04-17 DIAGNOSIS — Z8711 Personal history of peptic ulcer disease: Secondary | ICD-10-CM | POA: Insufficient documentation

## 2018-04-17 DIAGNOSIS — I4891 Unspecified atrial fibrillation: Secondary | ICD-10-CM | POA: Diagnosis not present

## 2018-04-17 DIAGNOSIS — Z794 Long term (current) use of insulin: Secondary | ICD-10-CM | POA: Insufficient documentation

## 2018-04-17 DIAGNOSIS — Z79899 Other long term (current) drug therapy: Secondary | ICD-10-CM | POA: Diagnosis not present

## 2018-04-17 DIAGNOSIS — I129 Hypertensive chronic kidney disease with stage 1 through stage 4 chronic kidney disease, or unspecified chronic kidney disease: Secondary | ICD-10-CM

## 2018-04-17 DIAGNOSIS — Z853 Personal history of malignant neoplasm of breast: Secondary | ICD-10-CM | POA: Insufficient documentation

## 2018-04-17 DIAGNOSIS — Z888 Allergy status to other drugs, medicaments and biological substances status: Secondary | ICD-10-CM | POA: Insufficient documentation

## 2018-04-17 DIAGNOSIS — E119 Type 2 diabetes mellitus without complications: Secondary | ICD-10-CM

## 2018-04-17 LAB — CBC WITH DIFFERENTIAL/PLATELET
Abs Immature Granulocytes: 0.03 10*3/uL (ref 0.00–0.07)
BASOS ABS: 0.1 10*3/uL (ref 0.0–0.1)
Basophils Relative: 1 %
EOS ABS: 0.1 10*3/uL (ref 0.0–0.5)
Eosinophils Relative: 1 %
HEMATOCRIT: 23.2 % — AB (ref 36.0–46.0)
HEMOGLOBIN: 7.1 g/dL — AB (ref 12.0–15.0)
IMMATURE GRANULOCYTES: 0 %
LYMPHS ABS: 1.1 10*3/uL (ref 0.7–4.0)
Lymphocytes Relative: 13 %
MCH: 23.4 pg — ABNORMAL LOW (ref 26.0–34.0)
MCHC: 30.6 g/dL (ref 30.0–36.0)
MCV: 76.6 fL — AB (ref 80.0–100.0)
Monocytes Absolute: 0.6 10*3/uL (ref 0.1–1.0)
Monocytes Relative: 8 %
NEUTROS PCT: 77 %
Neutro Abs: 6.6 10*3/uL (ref 1.7–7.7)
Platelets: 225 10*3/uL (ref 150–400)
RBC: 3.03 MIL/uL — ABNORMAL LOW (ref 3.87–5.11)
RDW: 17.2 % — AB (ref 11.5–15.5)
WBC: 8.5 10*3/uL (ref 4.0–10.5)
nRBC: 0 % (ref 0.0–0.2)

## 2018-04-17 LAB — COMPREHENSIVE METABOLIC PANEL
ALBUMIN: 3.5 g/dL (ref 3.5–5.0)
ALK PHOS: 43 U/L (ref 38–126)
ALT: 16 U/L (ref 0–44)
AST: 22 U/L (ref 15–41)
Anion gap: 15 (ref 5–15)
BILIRUBIN TOTAL: 0.5 mg/dL (ref 0.3–1.2)
BUN: 53 mg/dL — AB (ref 8–23)
CO2: 24 mmol/L (ref 22–32)
Calcium: 10.1 mg/dL (ref 8.9–10.3)
Chloride: 97 mmol/L — ABNORMAL LOW (ref 98–111)
Creatinine, Ser: 3.01 mg/dL (ref 0.44–1.00)
GFR calc Af Amer: 16 mL/min — ABNORMAL LOW (ref >60)
GFR, EST NON AFRICAN AMERICAN: 14 mL/min — AB (ref >60)
GLUCOSE: 163 mg/dL — AB (ref 70–99)
POTASSIUM: 3.6 mmol/L (ref 3.5–5.1)
Sodium: 136 mmol/L (ref 135–145)
TOTAL PROTEIN: 7.2 g/dL (ref 6.5–8.1)

## 2018-04-17 LAB — RETICULOCYTES
Immature Retic Fract: 26.9 % — ABNORMAL HIGH (ref 2.3–15.9)
RBC.: 3.42 MIL/uL — ABNORMAL LOW (ref 3.87–5.11)
Retic Count, Absolute: 48.5 10*3/uL (ref 19.0–186.0)
Retic Ct Pct: 1.4 % (ref 0.4–3.1)

## 2018-04-17 LAB — GLUCOSE, CAPILLARY
Glucose-Capillary: 112 mg/dL — ABNORMAL HIGH (ref 70–99)
Glucose-Capillary: 197 mg/dL — ABNORMAL HIGH (ref 70–99)

## 2018-04-17 LAB — PROTIME-INR
INR: 2.05
Prothrombin Time: 22.9 seconds — ABNORMAL HIGH (ref 11.4–15.2)

## 2018-04-17 LAB — PREPARE RBC (CROSSMATCH)

## 2018-04-17 MED ORDER — SODIUM CHLORIDE 0.9% FLUSH
3.0000 mL | Freq: Two times a day (BID) | INTRAVENOUS | Status: DC
  Administered 2018-04-17 – 2018-04-19 (×3): 3 mL via INTRAVENOUS

## 2018-04-17 MED ORDER — WARFARIN SODIUM 5 MG PO TABS
5.0000 mg | ORAL_TABLET | Freq: Once | ORAL | Status: AC
  Administered 2018-04-17: 5 mg via ORAL
  Filled 2018-04-17: qty 1

## 2018-04-17 MED ORDER — ACETAMINOPHEN 650 MG RE SUPP: 650.0000 mg | Freq: Four times a day (QID) | RECTAL | Status: DC | PRN

## 2018-04-17 MED ORDER — POTASSIUM CHLORIDE CRYS ER 20 MEQ PO TBCR
40.0000 meq | EXTENDED_RELEASE_TABLET | Freq: Two times a day (BID) | ORAL | Status: DC
  Administered 2018-04-18 – 2018-04-19 (×3): 40 meq via ORAL
  Filled 2018-04-17 (×3): qty 2

## 2018-04-17 MED ORDER — INSULIN DETEMIR 100 UNIT/ML ~~LOC~~ SOLN
12.0000 [IU] | Freq: Every day | SUBCUTANEOUS | Status: DC
  Administered 2018-04-17 – 2018-04-19 (×2): 12 [IU] via SUBCUTANEOUS
  Filled 2018-04-17 (×3): qty 0.12

## 2018-04-17 MED ORDER — SODIUM CHLORIDE 0.9% FLUSH: 3.0000 mL | INTRAVENOUS | Status: DC | PRN

## 2018-04-17 MED ORDER — WARFARIN - PHARMACIST DOSING INPATIENT: Freq: Every day | Status: DC

## 2018-04-17 MED ORDER — CALCITRIOL 0.25 MCG PO CAPS
0.2500 ug | ORAL_CAPSULE | ORAL | Status: DC
  Administered 2018-04-18: 0.25 ug via ORAL
  Filled 2018-04-17: qty 1

## 2018-04-17 MED ORDER — ATORVASTATIN CALCIUM 40 MG PO TABS
40.0000 mg | ORAL_TABLET | Freq: Every day | ORAL | Status: DC
  Administered 2018-04-17 – 2018-04-19 (×3): 40 mg via ORAL
  Filled 2018-04-17 (×3): qty 1

## 2018-04-17 MED ORDER — ONDANSETRON HCL 4 MG/2ML IJ SOLN: 4.0000 mg | Freq: Four times a day (QID) | INTRAMUSCULAR | Status: DC | PRN

## 2018-04-17 MED ORDER — COLCHICINE 0.6 MG PO TABS
0.6000 mg | ORAL_TABLET | Freq: Every day | ORAL | Status: DC
  Filled 2018-04-17 (×2): qty 1

## 2018-04-17 MED ORDER — ACETAMINOPHEN 325 MG PO TABS: 650.0000 mg | ORAL_TABLET | Freq: Four times a day (QID) | ORAL | Status: DC | PRN

## 2018-04-17 MED ORDER — FUROSEMIDE 40 MG PO TABS
40.0000 mg | ORAL_TABLET | Freq: Every day | ORAL | Status: DC
  Administered 2018-04-18: 40 mg via ORAL
  Filled 2018-04-17: qty 1

## 2018-04-17 MED ORDER — METOPROLOL SUCCINATE ER 25 MG PO TB24
25.0000 mg | ORAL_TABLET | Freq: Every day | ORAL | Status: DC
  Administered 2018-04-17 – 2018-04-18 (×2): 25 mg via ORAL
  Filled 2018-04-17 (×2): qty 1

## 2018-04-17 MED ORDER — INSULIN ASPART 100 UNIT/ML ~~LOC~~ SOLN: 0.0000 [IU] | Freq: Every day | SUBCUTANEOUS | Status: DC

## 2018-04-17 MED ORDER — ADULT MULTIVITAMIN W/MINERALS CH
1.0000 | ORAL_TABLET | Freq: Every day | ORAL | Status: DC
  Administered 2018-04-18 – 2018-04-19 (×2): 1 via ORAL
  Filled 2018-04-17 (×2): qty 1

## 2018-04-17 MED ORDER — POTASSIUM CHLORIDE CRYS ER 20 MEQ PO TBCR
40.0000 meq | EXTENDED_RELEASE_TABLET | Freq: Once | ORAL | Status: AC
  Administered 2018-04-17: 40 meq via ORAL
  Filled 2018-04-17: qty 2

## 2018-04-17 MED ORDER — CALCIUM CARBONATE ANTACID 500 MG PO CHEW
1.0000 | CHEWABLE_TABLET | Freq: Every day | ORAL | Status: DC
  Administered 2018-04-18 – 2018-04-19 (×2): 200 mg via ORAL
  Filled 2018-04-17 (×2): qty 1

## 2018-04-17 MED ORDER — SODIUM CHLORIDE 0.9 % IV SOLN
10.0000 mL/h | Freq: Once | INTRAVENOUS | Status: AC
  Administered 2018-04-17: 10 mL/h via INTRAVENOUS

## 2018-04-17 MED ORDER — SODIUM CHLORIDE 0.9 % IV SOLN
INTRAVENOUS | Status: DC
  Administered 2018-04-17: 14:00:00 via INTRAVENOUS

## 2018-04-17 MED ORDER — METOPROLOL SUCCINATE ER 50 MG PO TB24
50.0000 mg | ORAL_TABLET | Freq: Every day | ORAL | Status: DC
  Administered 2018-04-18 – 2018-04-19 (×2): 50 mg via ORAL
  Filled 2018-04-17 (×2): qty 1

## 2018-04-17 MED ORDER — ANASTROZOLE 1 MG PO TABS
1.0000 mg | ORAL_TABLET | Freq: Every day | ORAL | Status: DC
  Administered 2018-04-17 – 2018-04-19 (×3): 1 mg via ORAL
  Filled 2018-04-17 (×3): qty 1

## 2018-04-17 MED ORDER — METOLAZONE 2.5 MG PO TABS: 2.5000 mg | ORAL_TABLET | ORAL | Status: DC

## 2018-04-17 MED ORDER — INSULIN ASPART 100 UNIT/ML ~~LOC~~ SOLN: 0.0000 [IU] | Freq: Three times a day (TID) | SUBCUTANEOUS | Status: DC

## 2018-04-17 MED ORDER — FUROSEMIDE 40 MG PO TABS: 40.0000 mg | ORAL_TABLET | Freq: Every day | ORAL | Status: DC

## 2018-04-17 MED ORDER — INSULIN DETEMIR 100 UNIT/ML ~~LOC~~ SOLN
12.0000 [IU] | Freq: Every day | SUBCUTANEOUS | Status: DC
  Administered 2018-04-18: 12 [IU] via SUBCUTANEOUS
  Filled 2018-04-17 (×3): qty 0.12

## 2018-04-17 MED ORDER — SODIUM CHLORIDE 0.9 % IV SOLN
250.0000 mL | INTRAVENOUS | Status: DC | PRN
  Administered 2018-04-17: 250 mL via INTRAVENOUS

## 2018-04-17 MED ORDER — PANTOPRAZOLE SODIUM 40 MG PO TBEC
40.0000 mg | DELAYED_RELEASE_TABLET | Freq: Every day | ORAL | Status: DC
  Administered 2018-04-17 – 2018-04-19 (×3): 40 mg via ORAL
  Filled 2018-04-17 (×3): qty 1

## 2018-04-17 MED ORDER — FUROSEMIDE 20 MG PO TABS: 20.0000 mg | ORAL_TABLET | ORAL | Status: DC

## 2018-04-17 MED ORDER — ONDANSETRON HCL 4 MG PO TABS: 4.0000 mg | ORAL_TABLET | Freq: Four times a day (QID) | ORAL | Status: DC | PRN

## 2018-04-17 NOTE — Progress Notes (Signed)
ANTICOAGULATION CONSULT NOTE - Initial Consult  Pharmacy Consult for Coumadin Indication: atrial fibrillation  Allergies  Allergen Reactions  . Amlodipine Besylate Swelling    edema    Vital Signs: Temp: 97.9 F (36.6 C) (10/22 1605) Temp Source: Oral (10/22 1545) BP: 145/55 (10/22 1605) Pulse Rate: 70 (10/22 1605)  Labs: Recent Labs    04/17/18 0916 04/17/18 1321  HGB 7.1*  --   HCT 23.2*  --   PLT 225  --   LABPROT  --  22.9*  INR  --  2.05  CREATININE 3.01*  --     Estimated Creatinine Clearance: 14.5 mL/min (A) (by C-G formula based on SCr of 3.01 mg/dL [facility])).   Medical History: Past Medical History:  Diagnosis Date  . Anemia   . Anemia   . Arthritis    gout  . Atrial fibrillation ([place])    prior Sotalol - d/c'd 2/2 increased creatinine; rate control strategy  . CAD (coronary artery disease)    s/p PCI in [place] in 1/09  . Cancer [facility])    left breast  . Chronic combined systolic and diastolic heart failure (HCC)    Echocardiogram 3/12: Mild LVH, EF 16-10%, normal diastolic function, mild AI, mild MR, PASP 44, normal wall motion  . CKD (chronic kidney disease)    sees Dr. Burman Foster - Stage 3 ?  Marland Kitchen Diabetes mellitus   . Hyperlipemia   . Hypertension   . MI, old    2009  . Osteoporosis   . Pacemaker   . Pleural effusion   . Pneumonia   . PUD (peptic ulcer disease) 10/2010   duodenal ulcer    Assessment: Patient is taking coumadin PTA for atrial fibrillation at a dose of 2.5 mg on M/W/F/Sat and 5 mg on Sun/Tues/Thurs. Her last dose of was 2.5 mg last night (10/21) @ 1900. Patient had INR drawn today at 1330 and the resulted INR was 2.05. INR is currently therapeutic on PTA dose. Hgb low at 7.1 and platelets are normal at 225. One unit of blood has been given for symptomatic anemia. No DDIs noted upon review.  Goal of Therapy:  INR 2-3 Monitor platelets by anticoagulation protocol: Yes    Plan:  Coumadin 5 mg x 1 dose at 1800 Monitor for signs and  symptoms of bleeding Daily INR  Donnal Debar  PharmD Candidate 04/17/2018,5:06 PM

## 2018-04-17 NOTE — Progress Notes (Signed)
Pt Hgb 7.1 and creatinine is 3.01 today pt is going to be seen by Lucianne Lei in symptoms management. PA wants to hold prolia at this time   Kasandra Knudsen LPN

## 2018-04-17 NOTE — ED Notes (Signed)
Blood bank said they had 2 packs of red cells ready for this pt, notified Madina,RN

## 2018-04-17 NOTE — H&P (Addendum)
History and Physical    Yolanda Huffman  BMW:[id number]  DOB: 07-13-1940  DOA: 04/17/2018 PCP: Eulas Post, MD   Patient coming from: home  Chief Complaint: abnormal labs  HPI: Yolanda Huffman is a 77 y.o. female with medical history of A-fib, CAD s/p PCI, left breast CA, chronic combined CHF, DM2, CKD 3, HTN, HLD, pacemaker who has been feeling weak for over a month. She had blood work done today and is sent in for a low Hb and high Cr. She has not noted any blood loss. She has a h/o PUD but stool hemoccult is negative.   ED Course: Bun/ Cr 53/ 3.01, Hb 7.1  Review of Systems:  All other systems reviewed and apart from HPI, are negative.  Past Medical History:  Diagnosis Date  . Anemia   . Anemia   . Arthritis    gout  . Atrial fibrillation ([place])    prior Sotalol - d/c'd 2/2 increased creatinine; rate control strategy  . CAD (coronary artery disease)    s/p PCI in [place] in 1/09  . Cancer [facility])    left breast  . Chronic combined systolic and diastolic heart failure (HCC)    Echocardiogram 3/12: Mild LVH, EF 27-25%, normal diastolic function, mild AI, mild MR, PASP 44, normal wall motion  . CKD (chronic kidney disease)    sees Dr. Burman Foster - Stage 3 ?  Marland Kitchen Diabetes mellitus   . Hyperlipemia   . Hypertension   . MI, old    2009  . Osteoporosis   . Pacemaker   . Pleural effusion   . Pneumonia   . PUD (peptic ulcer disease) 10/2010   duodenal ulcer    Past Surgical History:  Procedure Laterality Date  . BREAST BIOPSY Left 07/29/2016  . BREAST LUMPECTOMY Left 09/19/2016  . BREAST LUMPECTOMY WITH RADIOACTIVE SEED LOCALIZATION Left 09/19/2016   Procedure: LEFT BREAST LUMPECTOMY WITH RADIOACTIVE SEED LOCALIZATION;  Surgeon: Autumn Messing III, MD;  Location: [place];  Service: General;  Laterality: Left;  . CATARACT EXTRACTION Bilateral    01/12/09 and 01/26/09 both eyes  . COLONOSCOPY    . CORONARY ANGIOPLASTY  2009  . PACEMAKER INSERTION  07/13/07  . stent implant  07/13/2007   . TOTAL HIP ARTHROPLASTY Right 07/23/2017   Procedure: TOTAL HIP ARTHROPLASTY ANTERIOR APPROACH;  Surgeon: Mcarthur Rossetti, MD;  Location: [place];  Service: Orthopedics;  Laterality: Right;    Social History:   reports that she quit smoking about 26 years ago. Her smoking use included cigarettes. She has a 40.00 pack-year smoking history. She has never used smokeless tobacco. She reports that she does not drink alcohol or use drugs.  Allergies  Allergen Reactions  . Amlodipine Besylate Swelling    edema    Family History  Problem Relation Age of Onset  . Heart disease Father   . Heart attack Father   . Clotting disorder Father        blood clot  . Hypertension Father   . Heart disease Mother   . Breast cancer Maternal Aunt 55  . Breast cancer Sister 66  . Breast cancer Other 40  . Colon cancer Neg Hx      Prior to Admission medications   Medication Sig Start Date End Date Taking? Authorizing Provider  anastrozole (ARIMIDEX) 1 MG tablet TAKE 1 TABLET BY MOUTH DAILY 12/18/17  Yes Magrinat, Virgie Dad, MD  atorvastatin (LIPITOR) 40 MG tablet TAKE ONE TABLET BY MOUTH DAILY  02/12/18  Yes Burchette, Alinda Sierras, MD  calcitRIOL (ROCALTROL) 0.25 MCG capsule Take 0.25 mcg by mouth every Monday, Wednesday, and Friday. In the morning. 10/13/14  Yes [provider]  calcium carbonate (TUMS - DOSED IN MG ELEMENTAL CALCIUM) 500 MG chewable tablet Chew 1 tablet by mouth daily.   Yes [provider]  Cranberry 500 MG CAPS Take 500 mg by mouth daily with lunch.   Yes [provider]  denosumab (PROLIA) 60 MG/ML SOSY injection Inject 60 mg into the skin every 6 (six) months.   Yes [provider]  furosemide (LASIX) 40 MG tablet TAKE TWO TABLETS BY MOUTH EVERY MORNING AND TAKE ONE TABLET BY MOUTH EVERY EVENING Patient taking differently: Take 20-40 mg by mouth. 40 mg in the am and 20 mg at lunch time 01/15/18  Yes Burchette, Alinda Sierras, MD  Insulin Detemir (LEVEMIR  FLEXTOUCH) 100 UNIT/ML Pen Inject 12 Units into the skin daily. 01/29/18  Yes Burchette, Alinda Sierras, MD  metolazone (ZAROXOLYN) 2.5 MG tablet TAKE 1 TABLET BY MOUTH EVERY MONDAY, WEDNESDAY, AND FRIDAY IN THE MORNING Patient taking differently: Take 2.5 mg by mouth. Mon, Wed and Fri 03/16/18  Yes Burchette, Alinda Sierras, MD  metoprolol succinate (TOPROL-XL) 25 MG 24 hr tablet TAKE TWO TABLET BY MOUTH EVERY MORNING AND THEN ONE TABLET BY MOUTH EVERY EVENING 03/30/18  Yes Burchette, Alinda Sierras, MD  Multiple Vitamin (MULTIVITAMIN WITH MINERALS) TABS tablet Take 1 tablet by mouth daily. FOR ADULT 50+   Yes [provider]  Omega-3 Fatty Acids (FISH OIL) 1000 MG CAPS Take 1,000 mg by mouth 2 (two) times daily.   Yes [provider]  pantoprazole (PROTONIX) 40 MG tablet TAKE ONE TABLET BY MOUTH DAILY 04/13/18  Yes Burchette, Alinda Sierras, MD  potassium chloride SA (K-DUR,KLOR-CON) 20 MEQ tablet TAKE 2 TABLET BY MOUTH TWO TIMES A DAY WITH FOOD AND WATER. Patient taking differently: Take 40 mEq by mouth. TAKE 2 TABLET BY MOUTH TWO TIMES A DAY WITH FOOD AND WATER. 03/28/18  Yes Evans Lance, MD  warfarin (COUMADIN) 5 MG tablet Take 1/2 tablet daily except 1 on sun Tues Thurs or TAKE AS [place] Patient taking differently: 2.5 mg on Mon, Wed, Fri and Sat 5 mg on Tues Thurs Sun 03/15/18  Yes Burchette, Alinda Sierras, MD  colchicine 0.6 MG tablet TAKE 1 TABLET (0.6 MG TOTAL) BY MOUTH AS NEEDED AS DIRECTED. Patient taking differently: Take 0.6 mg by mouth daily.  05/12/15   Burchette, Alinda Sierras, MD  feeding supplement, GLUCERNA SHAKE, (GLUCERNA SHAKE) LIQD Take 237 mLs by mouth 2 (two) times daily between meals. Patient not taking: Reported on 04/17/2018 07/25/17   Debbe Odea, MD  glucose blood test strip 1 each by Other route 3 (three) times daily. Use as instructed     [provider]    Physical Exam: Wt Readings from Last 3 Encounters:  04/17/18 67.9 kg  03/16/18 66.7 kg    12/13/17 66.8 kg   Vitals:   04/17/18 1605 04/17/18 1630 04/17/18 1645 04/17/18 1700  BP: (!) 145/55 (!) 147/60 (!) 146/70 (!) 153/61  Pulse: 70 72 83   Resp: 18 18 15 15   Temp: 97.9 F (36.6 C)     TempSrc:      SpO2: 99% 97% 100%       Constitutional:  Calm & comfortable Eyes: PERRLA, lids normal and conjunctivae are pale ENT:  Mucous membranes are moist.  Pharynx clear of exudate  Normal dentition.  Neck: Supple, no masses  Respiratory:  Clear to auscultation bilaterally  Normal respiratory effort.  Cardiovascular:  S1 & S2 heard, regular rate and rhythm-  No Murmurs Abdomen:  Non distended No tenderness, No masses Bowel sounds normal Extremities:  No clubbing / cyanosis No pedal edema No joint deformity    Skin:  No rashes, lesions or ulcers Neurologic:  AAO x 3 CN 2-12 grossly intact Sensation intact Strength 5/5 in all 4 extremities Psychiatric:  Normal Mood and affect    Labs on Admission: I have personally reviewed following labs and imaging studies  CBC: Recent Labs  Lab 04/17/18 0916  WBC 8.5  NEUTROABS 6.6  HGB 7.1*  HCT 23.2*  MCV 76.6*  PLT 009   Basic Metabolic Panel: Recent Labs  Lab 04/17/18 0916  NA 136  K 3.6  CL 97*  CO2 24  GLUCOSE 163*  BUN 53*  CREATININE 3.01*  CALCIUM 10.1   GFR: Estimated Creatinine Clearance: 14.5 mL/min (A) (by C-G formula based on SCr of 3.01 mg/dL [facility])). Liver Function Tests: Recent Labs  Lab 04/17/18 0916  AST 22  ALT 16  ALKPHOS 43  BILITOT 0.5  PROT 7.2  ALBUMIN 3.5   No results for input(s): LIPASE, AMYLASE in the last 168 hours. No results for input(s): AMMONIA in the last 168 hours. Coagulation Profile: Recent Labs  Lab 04/17/18 1321  INR 2.05   Cardiac Enzymes: No results for input(s): CKTOTAL, CKMB, CKMBINDEX, TROPONINI in the last 168 hours. BNP (last 3 results) No results for input(s): PROBNP in the last 8760 hours. HbA1C: No results for input(s): HGBA1C in  the last 72 hours. CBG: No results for input(s): GLUCAP in the last 168 hours. Lipid Profile: No results for input(s): CHOL, HDL, LDLCALC, TRIG, CHOLHDL, LDLDIRECT in the last 72 hours. Thyroid Function Tests: No results for input(s): TSH, T4TOTAL, FREET4, T3FREE, THYROIDAB in the last 72 hours. Anemia Panel: No results for input(s): VITAMINB12, FOLATE, FERRITIN, TIBC, IRON, RETICCTPCT in the last 72 hours. Urine analysis:    Component Value Date/Time   COLORURINE LT. YELLOW 07/01/2011 1203   APPEARANCEUR CLEAR 07/01/2011 1203   LABSPEC 1.010 07/01/2011 1203   PHURINE 6.0 07/01/2011 1203   GLUCOSEU NEGATIVE 07/01/2011 1203   HGBUR TRACE-LYSED 07/01/2011 1203   BILIRUBINUR neg 05/02/2013 1215   KETONESUR NEGATIVE 07/01/2011 1203   PROTEINUR 1 05/02/2013 1215   PROTEINUR NEGATIVE 09/15/2010 0449   UROBILINOGEN 0.2 05/02/2013 1215   UROBILINOGEN 0.2 07/01/2011 1203   NITRITE neg 05/02/2013 1215   NITRITE NEGATIVE 07/01/2011 1203   LEUKOCYTESUR moderate (2+) 05/02/2013 1215   Sepsis Labs: @LABRCNTIP (procalcitonin:4,lacticidven:4) )No results found for this or any previous visit (from the past 240 hour(s)).   Radiological Exams on Admission: No results found.     Assessment/Plan Principal Problem:   Symptomatic anemia- microcytic - last Hb on 4/19 was ~ 11.7 - ? 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  hypertension  - cont Levemir and add SSI    ATRIAL FIBRILLATION - rate controlled- cont Toprol and Coumadin  H/o Breast CA - cont Arimidix  Osteoporosis - patient asking for Denosumab (was due to get it today) to be given while she is in the hospital but this is not a drug that is given in the hospital    Cardiac pacemaker in situ   DVT prophylaxis: therapeutic on Coumadin Code Status: Full code  Family Communication: daughter  Disposition Plan: home tomorrow  Consults called: none  Admission status: observation     Debbe Odea MD [facility] Pager: [email or web address] Password [email or web address] 7PM-7AM, please contact night-coverage   04/17/2018, 5:46 PM

## 2018-04-17 NOTE — ED Notes (Signed)
Bed: WA01 Expected date:  Expected time:  Means of arrival:  Comments: Hold cancer center

## 2018-04-17 NOTE — ED Triage Notes (Addendum)
Patient sent from cancer center for evaluation of abnormal labs, elevated kidney function and low hemoglobin. Requesting iron labs.

## 2018-04-17 NOTE — ED Provider Notes (Addendum)
[facility] DEPT Provider Note   CSN: [id number] Arrival date & time: 04/17/18  1220     History   Chief Complaint Chief Complaint  Patient presents with  . Abnormal Lab    HPI Yolanda Huffman is a 77 y.o. female.  77 year old female presents with 5 g drop in hemoglobin x3 months with associated worsening renal insufficiency.  Patient at the cancer center today to have an infusion and blood work was performed which showed the lab abnormalities.  Patient's last creatinine 6 months ago was 2.56 and today is 3.  Patient does have a history of breast cancer being treated hormone therapy.  She also has a history of peptic ulcer disease and is on Coumadin.  Denies any black stools.  Is not having hematemesis.  That she has been experiencing some weakness that has been persistent and nonfocal.  Denies any dyspnea.  Was sent from the cancer center for further evaluation.     Past Medical History:  Diagnosis Date  . Anemia   . Anemia   . Arthritis    gout  . Atrial fibrillation ([place])    prior Sotalol - d/c'd 2/2 increased creatinine; rate control strategy  . CAD (coronary artery disease)    s/p PCI in [place] in 1/09  . Cancer [facility])    left breast  . Chronic combined systolic and diastolic heart failure (HCC)    Echocardiogram 3/12: Mild LVH, EF 47-42%, normal diastolic function, mild AI, mild MR, PASP 44, normal wall motion  . CKD (chronic kidney disease)    sees Dr. Burman Foster - Stage 3 ?  Marland Kitchen Diabetes mellitus   . Hyperlipemia   . Hypertension   . MI, old    2009  . Osteoporosis   . Pacemaker   . Pleural effusion   . Pneumonia   . PUD (peptic ulcer disease) 10/2010   duodenal ulcer    Patient Active Problem List   Diagnosis Date Noted  . Status post right hip replacement 09/04/2017  . Chronic combined systolic and diastolic CHF (congestive heart failure) ([place]) 07/25/2017  . Closed displaced fracture of right femoral neck ([place]) 07/22/2017  .  Long term (current) use of anticoagulants 03/08/2017  . Genetic testing 09/07/2016  . 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 Gout 11/03/2015  . Personal history of colonic polyps 04/15/2015  . Chronic anticoagulation 04/15/2015  . Insulin dependent diabetes mellitus ([place]) 04/15/2015  . Encounter for therapeutic drug monitoring 08/01/2013  . CKD (chronic kidney disease) 06/14/2011  . Pleural effusion 06/10/2011  . DOE (dyspnea on exertion) 06/10/2011  . Edema 11/30/2010  . Duodenal ulcer 11/12/2010  . Pneumonia 11/12/2010  . Anemia associated with acute blood loss 08/12/2010  . BLOOD IN STOOL 08/06/2010  . ATRIAL FIBRILLATION 03/24/2010  . 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 Hyperlipidemia 02/23/2010  . Essential hypertension 02/23/2010  . Coronary atherosclerosis 02/23/2010  . UTI'S, RECURRENT 02/23/2010  . Osteoporosis 02/23/2010  . Cardiac pacemaker in situ 02/23/2010    Past Surgical History:  Procedure Laterality Date  . BREAST BIOPSY Left 07/29/2016  . BREAST LUMPECTOMY Left 09/19/2016  . BREAST LUMPECTOMY WITH RADIOACTIVE SEED LOCALIZATION Left 09/19/2016   Procedure: LEFT BREAST LUMPECTOMY WITH RADIOACTIVE SEED LOCALIZATION;  Surgeon: Autumn Messing III, MD;  Location: [place];  Service: General;  Laterality: Left;  . CATARACT EXTRACTION Bilateral    01/12/09 and 01/26/09 both eyes  . COLONOSCOPY    .  CORONARY ANGIOPLASTY  2009  . PACEMAKER INSERTION  07/13/07  . stent implant  07/13/2007  . TOTAL HIP ARTHROPLASTY Right 07/23/2017   Procedure: TOTAL HIP ARTHROPLASTY ANTERIOR APPROACH;  Surgeon: Mcarthur Rossetti, MD;  Location: [place];  Service: Orthopedics;  Laterality: Right;     OB History   None      Home Medications    Prior to Admission medications   Medication Sig Start Date End Date Taking? Authorizing Provider  anastrozole (ARIMIDEX) 1 MG tablet TAKE 1 TABLET BY MOUTH DAILY 12/18/17   Magrinat, Virgie Dad, MD    atorvastatin (LIPITOR) 40 MG tablet TAKE ONE TABLET BY MOUTH DAILY 02/12/18   Burchette, Alinda Sierras, MD  calcitRIOL (ROCALTROL) 0.25 MCG capsule Take 0.25 mcg by mouth every Monday, Wednesday, and Friday. In the morning. 10/13/14   [provider]  colchicine 0.6 MG tablet TAKE 1 TABLET (0.6 MG TOTAL) BY MOUTH AS NEEDED AS DIRECTED. 05/12/15   Burchette, Alinda Sierras, MD  Cranberry 500 MG CAPS Take 500 mg by mouth daily with lunch.    [provider]  feeding supplement, GLUCERNA SHAKE, (GLUCERNA SHAKE) LIQD Take 237 mLs by mouth 2 (two) times daily between meals. 07/25/17   Debbe Odea, MD  furosemide (LASIX) 40 MG tablet TAKE TWO TABLETS BY MOUTH EVERY MORNING AND TAKE ONE TABLET BY MOUTH EVERY EVENING 01/15/18   Burchette, Alinda Sierras, MD  glucose blood test strip 1 each by Other route 3 (three) times daily. Use as instructed     [provider]  Insulin Detemir (LEVEMIR FLEXTOUCH) 100 UNIT/ML Pen Inject 12 Units into the skin daily. 01/29/18   Burchette, Alinda Sierras, MD  insulin detemir (LEVEMIR) 100 UNIT/ML injection Inject 12 Units into the skin at bedtime. And on sliding scale 12/22/11   Burchette, Alinda Sierras, MD  metolazone (ZAROXOLYN) 2.5 MG tablet TAKE 1 TABLET BY MOUTH EVERY MONDAY, WEDNESDAY, AND FRIDAY IN THE MORNING 03/16/18   Burchette, Alinda Sierras, MD  metoprolol succinate (TOPROL-XL) 25 MG 24 hr tablet TAKE TWO TABLET BY MOUTH EVERY MORNING AND THEN ONE TABLET BY MOUTH EVERY EVENING 03/30/18   Burchette, Alinda Sierras, MD  Multiple Vitamin (MULTIVITAMIN WITH MINERALS) TABS tablet Take 1 tablet by mouth daily. FOR ADULT 50+    [provider]  Omega-3 Fatty Acids (FISH OIL) 1000 MG CAPS Take 1,000 mg by mouth 2 (two) times daily.    [provider]  pantoprazole (PROTONIX) 40 MG tablet TAKE ONE TABLET BY MOUTH DAILY 04/13/18   Burchette, Alinda Sierras, MD  potassium chloride SA (K-DUR,KLOR-CON) 20 MEQ tablet TAKE 2 TABLET BY MOUTH TWO TIMES A DAY WITH FOOD AND WATER. 03/28/18    Evans Lance, MD  warfarin (COUMADIN) 5 MG tablet Take 1/2 tablet daily except 1 on sun Tues Thurs or TAKE AS DIRECTED BY ANTICOAGULATION CLINIC 03/15/18   Burchette, Alinda Sierras, MD    Family History Family History  Problem Relation Age of Onset  . Heart disease Father   . Heart attack Father   . Clotting disorder Father        blood clot  . Hypertension Father   . Heart disease Mother   . Breast cancer Maternal Aunt 60  . Breast cancer Sister 4  . Breast cancer Other 40  . Colon cancer Neg Hx     Social History Social History   Tobacco Use  . Smoking status: Former Smoker    Packs/day: 2.00    Years: 20.00  Pack years: 40.00    Types: Cigarettes    Last attempt to quit: 11/26/1991    Years since quitting: 26.4  . Smokeless tobacco: Never Used  Substance Use Topics  . Alcohol use: No  . Drug use: No     Allergies   Amlodipine besylate   Review of Systems Review of Systems  All other systems reviewed and are negative.    Physical Exam Updated Vital Signs BP (!) 145/59 (BP Location: Right Arm)   Pulse 71   Temp 97.6 F (36.4 C) (Oral)   Resp 19   SpO2 100%   Physical Exam  Constitutional: She is oriented to person, place, and time. She appears well-developed and well-nourished.  Non-toxic appearance. No distress.  HENT:  Head: Normocephalic and atraumatic.  Eyes: Pupils are equal, round, and reactive to light. Conjunctivae, EOM and lids are normal.  Neck: Normal range of motion. Neck supple. No tracheal deviation present. No thyroid mass present.  Cardiovascular: Normal rate, regular rhythm and normal heart sounds. Exam reveals no gallop.  No murmur heard. Pulmonary/Chest: Effort normal and breath sounds normal. No stridor. No respiratory distress. She has no decreased breath sounds. She has no wheezes. She has no rhonchi. She has no rales.  Abdominal: Soft. Normal appearance and bowel sounds are normal. She exhibits no distension. There is no  tenderness. There is no rebound and no CVA tenderness.  Musculoskeletal: Normal range of motion. She exhibits no edema or tenderness.  Neurological: She is alert and oriented to person, place, and time. She has normal strength. No cranial nerve deficit or sensory deficit. GCS eye subscore is 4. GCS verbal subscore is 5. GCS motor subscore is 6.  Skin: Skin is warm and dry. No abrasion and no rash noted.  Psychiatric: She has a normal mood and affect. Her speech is normal and behavior is normal.  Nursing note and vitals reviewed.    ED Treatments / Results  Labs (all labs ordered are listed, but only abnormal results are displayed) Labs Reviewed  PROTIME-INR  POC OCCULT BLOOD, ED  TYPE AND SCREEN    EKG None  Radiology No results found.  Procedures Procedures (including critical care time)  Medications Ordered in ED Medications  0.9 %  sodium chloride infusion (has no administration in time range)  0.9 %  sodium chloride infusion (has no administration in time range)     Initial Impression / Assessment and Plan / ED Course  I have reviewed the triage vital signs and the nursing notes.  Pertinent labs & imaging results that were available during my care of the patient were reviewed by me and considered in my medical decision making (see chart for details).     Patient here with symptomatically anemia.  Transfuse 1 unit of packed red blood cells.  Patient is guaiac negative.  Due to her known history of renal disease as well as history of being on Coumadin will admit to the hospitalist service.  Final Clinical Impressions(s) / ED Diagnoses   Final diagnoses:  None    ED Discharge Orders    None       Lacretia Leigh, MD 04/17/18 1528    Lacretia Leigh, MD 04/17/18 [phone]

## 2018-04-17 NOTE — Telephone Encounter (Signed)
Pt sched per 10/22 sch message. Pt aware

## 2018-04-17 NOTE — Patient Instructions (Signed)
Anemia Anemia is a condition in which you do not have enough red blood cells or hemoglobin. Hemoglobin is a substance in red blood cells that carries oxygen. When you do not have enough red blood cells or hemoglobin (are anemic), your body cannot get enough oxygen and your organs may not work properly. As a result, you may feel very tired or have other problems. What are the causes? Common causes of anemia include:  Excessive bleeding. Anemia can be caused by excessive bleeding inside or outside the body, including bleeding from the intestine or from periods in women.  Poor nutrition.  Long-lasting (chronic) kidney, thyroid, and liver disease.  Bone marrow disorders.  Cancer and treatments for cancer.  HIV (human immunodeficiency virus) and AIDS (acquired immunodeficiency syndrome).  Treatments for HIV and AIDS.  Spleen problems.  Blood disorders.  Infections, medicines, and autoimmune disorders that destroy red blood cells.  What are the signs or symptoms? Symptoms of this condition include:  Minor weakness.  Dizziness.  Headache.  Feeling heartbeats that are irregular or faster than normal (palpitations).  Shortness of breath, especially with exercise.  Paleness.  Cold sensitivity.  Indigestion.  Nausea.  Difficulty sleeping.  Difficulty concentrating.  Symptoms may occur suddenly or develop slowly. If your anemia is mild, you may not have symptoms. How is this diagnosed? This condition is diagnosed based on:  Blood tests.  Your medical history.  A physical exam.  Bone marrow biopsy.  Your health care provider may also check your stool (feces) for blood and may do additional testing to look for the cause of your bleeding. You may also have other tests, including:  Imaging tests, such as a CT scan or MRI.  Endoscopy.  Colonoscopy.  How is this treated? Treatment for this condition depends on the cause. If you continue to lose a lot of blood,  you may need to be treated at a hospital. Treatment may include:  Taking supplements of iron, vitamin B12, or folic acid.  Taking a hormone medicine (erythropoietin) that can help to stimulate red blood cell growth.  Having a blood transfusion. This may be needed if you lose a lot of blood.  Making changes to your diet.  Having surgery to remove your spleen.  Follow these instructions at home:  Take over-the-counter and prescription medicines only as told by your health care provider.  Take supplements only as told by your health care provider.  Follow any diet instructions that you were given.  Keep all follow-up visits as told by your health care provider. This is important. Contact a health care provider if:  You develop new bleeding anywhere in the body. Get help right away if:  You are very weak.  You are short of breath.  You have pain in your abdomen or chest.  You are dizzy or feel faint.  You have trouble concentrating.  You have bloody or black, tarry stools.  You vomit repeatedly or you vomit up blood. Summary  Anemia is a condition in which you do not have enough red blood cells or enough of a substance in your red blood cells that carries oxygen (hemoglobin).  Symptoms may occur suddenly or develop slowly.  If your anemia is mild, you may not have symptoms.  This condition is diagnosed with blood tests as well as a medical history and physical exam. Other tests may be needed.  Treatment for this condition depends on the cause of the anemia. This information is not intended to replace advice   given to you by your health care provider. Make sure you discuss any questions you have with your health care provider. Document Released: 07/21/2004 Document Revised: 07/15/2016 Document Reviewed: 07/15/2016 Elsevier Interactive Patient Education  [employer].

## 2018-04-17 NOTE — ED Notes (Signed)
ED TO INPATIENT HANDOFF REPORT  Name/Age/Gender Yolanda Huffman 77 y.o. female  Code Status    Code Status Orders  (From admission, onward)         Start     Ordered   04/17/18 1651  Full code  Continuous     04/17/18 1651        Code Status History    Date Active Date Inactive Code Status Order ID Comments User Context   07/22/2017 1427 07/25/2017 2133 Full Code [id number]  Lady Deutscher, MD ED    Advance Directive Documentation     Most Recent Value  Type of Advance Directive  Healthcare Power of [place], Living will  Pre-existing out of facility DNR order (yellow form or pink MOST form)  -  "MOST" Form in Place?  -      Home/SNF/Other Home  Chief Complaint low Hgb  Level of Care/Admitting Diagnosis ED Disposition    ED Disposition Condition [place]: [place] [100102]  Level of Care: Telemetry [5]  Admit to tele based on following criteria: Monitor for Ischemic changes  Diagnosis: Symptomatic anemia [2979892]  Admitting Physician: [place], [place]  Attending Physician: Debbe Odea [[id number]  PT Class (Do Not Modify): Observation [104]  PT Acc Code (Do Not Modify): Observation [10022]       Medical History Past Medical History:  Diagnosis Date  . Anemia   . Anemia   . Arthritis    gout  . Atrial fibrillation ([place])    prior Sotalol - d/c'd 2/2 increased creatinine; rate control strategy  . CAD (coronary artery disease)    s/p PCI in [place] in 1/09  . Cancer [facility])    left breast  . Chronic combined systolic and diastolic heart failure (HCC)    Echocardiogram 3/12: Mild LVH, EF 11-94%, normal diastolic function, mild AI, mild MR, PASP 44, normal wall motion  . CKD (chronic kidney disease)    sees Dr. Burman Foster - Stage 3 ?  Marland Kitchen Diabetes mellitus   . Hyperlipemia   . Hypertension   . MI, old    2009  . Osteoporosis   . Pacemaker   . Pleural effusion   . Pneumonia   . PUD (peptic ulcer disease)  10/2010   duodenal ulcer    Allergies Allergies  Allergen Reactions  . Amlodipine Besylate Swelling    edema    IV Location/Drains/Wounds Patient Lines/Drains/Airways Status   Active Line/Drains/Airways    Name:   Placement date:   Placement time:   Site:   Days:   Peripheral IV 04/17/18 Right Antecubital   04/17/18    1313    Antecubital   less than 1   Peripheral IV 04/17/18 Right Wrist   04/17/18    1359    Wrist   less than 1   Incision (Closed) 09/19/16 Breast Left   09/19/16    1558     575   Incision (Closed) 07/23/17 Leg Right   07/23/17    1238     268          Labs/Imaging Results for orders placed or performed during the hospital encounter of 04/17/18 (from the past 48 hour(s))  Type and screen     Status: None (Preliminary result)   Collection Time: 04/17/18  1:16 PM  Result Value Ref Range   ABO/RH(D) A NEG    Antibody Screen NEG    Sample Expiration 04/20/2018  Unit Number [id number]    Blood Component Type RED CELLS,LR    Unit division 00    Status of Unit ISSUED    Transfusion Status OK TO TRANSFUSE    Crossmatch Result      Compatible Performed at [facility] [address] [place], [place] [address]    Unit Number [id number]    Blood Component Type RED CELLS,LR    Unit division 00    Status of Unit ALLOCATED    Transfusion Status OK TO TRANSFUSE    Crossmatch Result Compatible   Prepare RBC     Status: None   Collection Time: 04/17/18  1:16 PM  Result Value Ref Range   Order Confirmation      ORDER PROCESSED BY BLOOD BANK Performed at [facility], [place] [address] [place], [place] [address]   Protime-INR     Status: Abnormal   Collection Time: 04/17/18  1:21 PM  Result Value Ref Range   Prothrombin Time 22.9 (H) 11.4 - 15.2 seconds   INR 2.05     Comment: Performed at [facility], [place] [address], [place], [place] [address]   No results found. None  Pending  Labs Unresulted Labs (From admission, onward)    Start     Ordered   04/18/18 2536  Basic metabolic panel  Tomorrow morning,   R     04/17/18 1651   04/18/18 0500  CBC  Tomorrow morning,   R     04/17/18 1651   04/17/18 1608  Vitamin B12  (Anemia Panel (PNL))  Once,   R     04/17/18 1608   04/17/18 1608  Folate  (Anemia Panel (PNL))  Once,   R     04/17/18 1608   04/17/18 1608  Iron and TIBC  (Anemia Panel (PNL))  Once,   R     04/17/18 1608   04/17/18 1608  Ferritin  (Anemia Panel (PNL))  Once,   R     04/17/18 1608   04/17/18 1608  Reticulocytes  (Anemia Panel (PNL))  Once,   R     04/17/18 1608          Vitals/Pain Today's Vitals   04/17/18 1500 04/17/18 1545 04/17/18 1601 04/17/18 1605  BP: (!) 151/56 (!) 128/93 (!) 149/56 (!) 145/55  Pulse:  70 70 70  Resp: 16 16 18 18   Temp:  98.1 F (36.7 C)  97.9 F (36.6 C)  TempSrc:  Oral    SpO2:  100% 100% 99%  PainSc:        Isolation Precautions No active isolations  Medications Medications  0.9 %  sodium chloride infusion ( Intravenous New Bag/Given 04/17/18 1400)  anastrozole (ARIMIDEX) tablet 1 mg (has no administration in time range)  atorvastatin (LIPITOR) tablet 40 mg (has no administration in time range)  calcitRIOL (ROCALTROL) capsule 0.25 mcg (has no administration in time range)  calcium carbonate (TUMS - dosed in mg elemental calcium) chewable tablet 200 mg of elemental calcium (has no administration in time range)  colchicine tablet 0.6 mg (has no administration in time range)  furosemide (LASIX) tablet 40 mg (has no administration in time range)  Insulin Detemir (LEVEMIR) FlexPen 12 Units (has no administration in time range)  metolazone (ZAROXOLYN) tablet 2.5 mg (has no administration in time range)  pantoprazole (PROTONIX) EC tablet 40 mg (has no administration in time range)  multivitamin with minerals tablet 1 tablet (has no administration in  time range)  metoprolol succinate (TOPROL-XL) 24 hr tablet  25 mg (has no administration in time range)  potassium chloride SA (K-DUR,KLOR-CON) CR tablet 40 mEq (has no administration in time range)  sodium chloride flush (NS) 0.9 % injection 3 mL (has no administration in time range)  sodium chloride flush (NS) 0.9 % injection 3 mL (has no administration in time range)  0.9 %  sodium chloride infusion (has no administration in time range)  acetaminophen (TYLENOL) tablet 650 mg (has no administration in time range)    Or  acetaminophen (TYLENOL) suppository 650 mg (has no administration in time range)  ondansetron (ZOFRAN) tablet 4 mg (has no administration in time range)    Or  ondansetron (ZOFRAN) injection 4 mg (has no administration in time range)  0.9 %  sodium chloride infusion (10 mL/hr Intravenous New Bag/Given 04/17/18 1403)    Mobility walks

## 2018-04-17 NOTE — ED Notes (Signed)
Coming form cancer center-states Hgb gone from 11.0 to 7.1-unable to manage as as outpatient at this time

## 2018-04-17 NOTE — Progress Notes (Signed)
Pt seen by PA Lucianne Lei only, no RN assessment at this time.  PA aware.  Pt to be transported to ED.

## 2018-04-18 ENCOUNTER — Encounter: Payer: Self-pay | Admitting: Adult Health

## 2018-04-18 DIAGNOSIS — N179 Acute kidney failure, unspecified: Secondary | ICD-10-CM

## 2018-04-18 DIAGNOSIS — D649 Anemia, unspecified: Secondary | ICD-10-CM

## 2018-04-18 DIAGNOSIS — Z794 Long term (current) use of insulin: Secondary | ICD-10-CM

## 2018-04-18 DIAGNOSIS — E119 Type 2 diabetes mellitus without complications: Secondary | ICD-10-CM

## 2018-04-18 DIAGNOSIS — I4821 Permanent atrial fibrillation: Secondary | ICD-10-CM | POA: Diagnosis not present

## 2018-04-18 DIAGNOSIS — I5042 Chronic combined systolic (congestive) and diastolic (congestive) heart failure: Secondary | ICD-10-CM | POA: Diagnosis not present

## 2018-04-18 DIAGNOSIS — D500 Iron deficiency anemia secondary to blood loss (chronic): Secondary | ICD-10-CM

## 2018-04-18 LAB — BASIC METABOLIC PANEL
ANION GAP: 12 (ref 5–15)
BUN: 65 mg/dL — AB (ref 8–23)
CALCIUM: 9.7 mg/dL (ref 8.9–10.3)
CO2: 28 mmol/L (ref 22–32)
Chloride: 99 mmol/L (ref 98–111)
Creatinine, Ser: 3.11 mg/dL — ABNORMAL HIGH (ref 0.44–1.00)
GFR calc Af Amer: 16 mL/min — ABNORMAL LOW (ref >60)
GFR, EST NON AFRICAN AMERICAN: 13 mL/min — AB (ref >60)
Glucose, Bld: 59 mg/dL — ABNORMAL LOW (ref 70–99)
POTASSIUM: 3.6 mmol/L (ref 3.5–5.1)
SODIUM: 139 mmol/L (ref 135–145)

## 2018-04-18 LAB — TYPE AND SCREEN
ABO/RH(D): A NEG
ANTIBODY SCREEN: NEGATIVE
UNIT DIVISION: 0
Unit division: 0

## 2018-04-18 LAB — CBC
HCT: 30.9 % — ABNORMAL LOW (ref 36.0–46.0)
Hemoglobin: 9.7 g/dL — ABNORMAL LOW (ref 12.0–15.0)
MCH: 25.5 pg — ABNORMAL LOW (ref 26.0–34.0)
MCHC: 31.4 g/dL (ref 30.0–36.0)
MCV: 81.1 fL (ref 80.0–100.0)
PLATELETS: 215 10*3/uL (ref 150–400)
RBC: 3.81 MIL/uL — AB (ref 3.87–5.11)
RDW: 17.6 % — ABNORMAL HIGH (ref 11.5–15.5)
WBC: 9 10*3/uL (ref 4.0–10.5)
nRBC: 0 % (ref 0.0–0.2)

## 2018-04-18 LAB — BPAM RBC
BLOOD PRODUCT EXPIRATION DATE: 201911132359
Blood Product Expiration Date: 201911132359
ISSUE DATE / TIME: 201910221536
ISSUE DATE / TIME: 201910222330
UNIT TYPE AND RH: 600
Unit Type and Rh: 600

## 2018-04-18 LAB — IRON AND TIBC
Iron: 18 ug/dL — ABNORMAL LOW (ref 28–170)
Saturation Ratios: 4 % — ABNORMAL LOW (ref 10.4–31.8)
TIBC: 412 ug/dL (ref 250–450)
UIBC: 394 ug/dL

## 2018-04-18 LAB — HEMOGLOBIN A1C
HEMOGLOBIN A1C: 6.4 % — AB (ref 4.8–5.6)
MEAN PLASMA GLUCOSE: 136.98 mg/dL

## 2018-04-18 LAB — VITAMIN B12: Vitamin B-12: 585 pg/mL (ref 180–914)

## 2018-04-18 LAB — GLUCOSE, CAPILLARY
GLUCOSE-CAPILLARY: 165 mg/dL — AB (ref 70–99)
Glucose-Capillary: 52 mg/dL — ABNORMAL LOW (ref 70–99)
Glucose-Capillary: 69 mg/dL — ABNORMAL LOW (ref 70–99)
Glucose-Capillary: 113 mg/dL — ABNORMAL HIGH (ref 70–99)
Glucose-Capillary: 93 mg/dL (ref 70–99)

## 2018-04-18 LAB — OCCULT BLOOD, POC DEVICE: FECAL OCCULT BLD: NEGATIVE

## 2018-04-18 LAB — PROTIME-INR
INR: 1.83
PROTHROMBIN TIME: 20.9 s — AB (ref 11.4–15.2)

## 2018-04-18 LAB — FOLATE: Folate: 47.4 ng/mL (ref >5.9)

## 2018-04-18 LAB — FERRITIN: FERRITIN: 5 ng/mL — AB (ref 11–307)

## 2018-04-18 MED ORDER — WARFARIN SODIUM 5 MG PO TABS
5.0000 mg | ORAL_TABLET | Freq: Once | ORAL | Status: AC
  Administered 2018-04-18: 5 mg via ORAL
  Filled 2018-04-18: qty 1

## 2018-04-18 MED ORDER — SODIUM CHLORIDE 0.9 % IV SOLN
500.0000 mg | Freq: Once | INTRAVENOUS | Status: AC
  Administered 2018-04-18: 500 mg via INTRAVENOUS
  Filled 2018-04-18: qty 10

## 2018-04-18 MED ORDER — SODIUM CHLORIDE 0.9 % IV SOLN
25.0000 mg | Freq: Once | INTRAVENOUS | Status: AC
  Administered 2018-04-18: 25 mg via INTRAVENOUS
  Filled 2018-04-18: qty 0.5

## 2018-04-18 NOTE — Progress Notes (Signed)
Symptoms Management Clinic Progress Note   Yolanda Huffman [id number] 04-11-1941 77 y.o.  [name] is managed by Dr. Jana Hakim up  Actively treated with chemotherapy/immunotherapy: yes  Current Therapy: Arimidex and Prolia  Assessment: Plan:    Stage 4 chronic kidney disease ([place])  Iron deficiency anemia, unspecified iron deficiency anemia type  Ductal carcinoma in situ (DCIS) of left breast  Other osteoporosis without current pathological fracture   Stage IV chronic kidney disease.  The patient's labs from today returned showing a creatinine of 3.11.  This was up from 2.56 at her last visit 6 months ago.  She continues to be followed by nephrology.  Suspected iron deficiency anemia: Patient CBC returned at 7.1 today with an MCV of 23.2.  Her last CBC completed on 10/17/2017 returned with a hemoglobin of 11.7 and an MCV of 89.1.  The patient was taken to the emergency room for evaluation and management.  Her labs from yesterday returned showing the following:  Reticulocyte count percentage: 1.4   Immature reticulocyte fraction: 26.9   Ferritin:    5 Iron:     18 TIBC:     412 Saturation ratios:   4 UIBC:     394 Folate:    47.4 Vitamin B12:    585  Dr. Jana Hakim is asked that the patient return for follow-up on 04/26/2018 at 2:30 PM with plans for a Feraheme infusion following her visit.  Osteoporosis: The patient continues on Prolia.  The patient's Prolia was held today given the increase in her creatinine over her baseline.  Ductal carcinoma in situ of the left breast: The patient continues on Arimidex.  Please see After Visit Summary for patient specific instructions.  Future Appointments  Date Time Provider [place]  04/27/2018  9:20 AM Eulas Post, MD [facility]  04/27/2018 10:00 AM [facility]-NURSE [facility]  05/14/2018 10:15 AM [facility] COUMADIN [facility]  05/18/2018  7:25 AM [facility] DEVICE REMOTES [facility]  06/15/2018 10:00  AM Burchette, Alinda Sierras, MD [facility]  10/17/2018 11:00 AM [facility] LAB 3 [facility] None  10/17/2018 11:30 AM Magrinat, Virgie Dad, MD [facility] None  10/17/2018 12:00 PM [facility] [place] FLUSH [facility] None    No orders of the defined types were placed in this encounter.      Subjective:   Patient ID:  Yolanda Huffman is a 77 y.o. (DOB 01/07/1941) female.  Chief Complaint: No chief complaint on file.   HPI Yolanda Huffman is a 77 year old female with a history of a ductal carcinoma in situ of the left breast and osteoporosis who is followed by Dr. Jana Hakim.  She is seen today after presenting for her Prolia injection.  She continues on a Arimidex.  She is seen after it was noted that she had anemia with a hemoglobin of 7.1.  She has a history of chronic kidney disease and is followed by nephrology.  Her creatinine however had risen to 3.01 which was up from 2.56 when last checked 6 months ago.  She reports that she sees her nephrologist regularly.  She was seen by cardiology recently with an echocardiogram completed 2 weeks ago with results showing "two valves leaking."  She is scheduled to have a transesophageal echocardiogram for further evaluation.  She reports that she has been feeling bad for the past 2 weeks with increasing shortness of breath and decreased energy.  She denies melena, bright red blood per rectum, hematuria, or other evidence of bleeding.  Medications: I  have reviewed the patient's current medications.  Allergies:  Allergies  Allergen Reactions  . Amlodipine Besylate Swelling    edema    Past Medical History:  Diagnosis Date  . Anemia   . Anemia   . Arthritis    gout  . Atrial fibrillation ([place])    prior Sotalol - d/c'd 2/2 increased creatinine; rate control strategy  . CAD (coronary artery disease)    s/p PCI in [place] in 1/09  . Cancer [facility])    left breast  . Chronic combined systolic and diastolic heart failure (HCC)    Echocardiogram 3/12: Mild LVH, EF  82-95%, normal diastolic function, mild AI, mild MR, PASP 44, normal wall motion  . CKD (chronic kidney disease)    sees Dr. Burman Foster - Stage 3 ?  Marland Kitchen Diabetes mellitus   . Hyperlipemia   . Hypertension   . MI, old    2009  . Osteoporosis   . Pacemaker   . Pleural effusion   . Pneumonia   . PUD (peptic ulcer disease) 10/2010   duodenal ulcer    Past Surgical History:  Procedure Laterality Date  . BREAST BIOPSY Left 07/29/2016  . BREAST LUMPECTOMY Left 09/19/2016  . BREAST LUMPECTOMY WITH RADIOACTIVE SEED LOCALIZATION Left 09/19/2016   Procedure: LEFT BREAST LUMPECTOMY WITH RADIOACTIVE SEED LOCALIZATION;  Surgeon: Autumn Messing III, MD;  Location: [place];  Service: General;  Laterality: Left;  . CATARACT EXTRACTION Bilateral    01/12/09 and 01/26/09 both eyes  . COLONOSCOPY    . CORONARY ANGIOPLASTY  2009  . PACEMAKER INSERTION  07/13/07  . stent implant  07/13/2007  . TOTAL HIP ARTHROPLASTY Right 07/23/2017   Procedure: TOTAL HIP ARTHROPLASTY ANTERIOR APPROACH;  Surgeon: Mcarthur Rossetti, MD;  Location: [place];  Service: Orthopedics;  Laterality: Right;    Family History  Problem Relation Age of Onset  . Heart disease Father   . Heart attack Father   . Clotting disorder Father        blood clot  . Hypertension Father   . Heart disease Mother   . Breast cancer Maternal Aunt 46  . Breast cancer Sister 43  . Breast cancer Other 40  . Colon cancer Neg Hx     Social History   Socioeconomic History  . Marital status: Widowed    Spouse name: Not on file  . Number of children: 1  . Years of education: Not on file  . Highest education level: Not on file  Occupational History  . Occupation: retired Theatre stage manager: RETIRED  Social Needs  . Financial resource strain: Not on file  . Food insecurity:    Worry: Not on file    Inability: Not on file  . Transportation needs:    Medical: Not on file    Non-medical: Not on file  Tobacco Use  . Smoking status: Former Smoker     Packs/day: 2.00    Years: 20.00    Pack years: 40.00    Types: Cigarettes    Last attempt to quit: 11/26/1991    Years since quitting: 26.4  . Smokeless tobacco: Never Used  Substance and Sexual Activity  . Alcohol use: No  . Drug use: No  . Sexual activity: Not on file  Lifestyle  . Physical activity:    Days per week: Not on file    Minutes per session: Not on file  . Stress: Not on file  Relationships  . Social connections:  Talks on phone: Not on file    Gets together: Not on file    Attends religious service: Not on file    Active member of club or organization: Not on file    Attends meetings of clubs or organizations: Not on file    Relationship status: Not on file  . Intimate partner violence:    Fear of current or ex partner: Not on file    Emotionally abused: Not on file    Physically abused: Not on file    Forced sexual activity: Not on file  Other Topics Concern  . Not on file  Social History Narrative   Reg exercise          Past Medical History, Surgical history, Social history, and Family history were reviewed and updated as appropriate.   Please see review of systems for further details on the patient's review from today.   Review of Systems:  Review of Systems  Constitutional: Positive for fatigue. Negative for appetite change, chills, diaphoresis and fever.  HENT: Negative for trouble swallowing.   Respiratory: Positive for shortness of breath. Negative for cough, choking, chest tightness, wheezing and stridor.   Cardiovascular: Negative for chest pain, palpitations and leg swelling.  Gastrointestinal: Negative for abdominal distention, abdominal pain, blood in stool, constipation, diarrhea, nausea and vomiting.  Genitourinary: Negative for decreased urine volume, difficulty urinating and hematuria.  Neurological: Negative for dizziness, syncope and weakness.    Objective:   Physical Exam:  BP 113/68 (BP Location: Left Arm, Patient  Position: Sitting)   Pulse 90   Temp 98.2 F (36.8 C) (Oral)   Resp 18   Ht 5\' 3"  (1.6 m)   Wt 149 lb 12.8 oz (67.9 kg)   SpO2 100%   BMI 26.54 kg/m  ECOG: 0  Physical Exam  Constitutional: No distress.  HENT:  Head: Normocephalic and atraumatic.  Mouth/Throat: Oropharynx is clear and moist.  Eyes: Right eye exhibits no discharge. Left eye exhibits no discharge. No scleral icterus.  Conjunctiva is pale  Cardiovascular: Normal rate, regular rhythm and normal heart sounds. Exam reveals no gallop and no friction rub.  No murmur heard. Pulmonary/Chest: Effort normal and breath sounds normal. No stridor. No respiratory distress. She has no wheezes. She has no rales.  Abdominal: She exhibits no distension and no mass. There is no tenderness. There is no rebound and no guarding. No hernia.  Neurological: She is alert.  Skin: Skin is warm and dry. She is not diaphoretic. No erythema. There is pallor.  Psychiatric: She has a normal mood and affect. Her behavior is normal. Judgment and thought content normal.    Lab Review:     Component Value Date/Time   NA 139 04/18/2018 0632   NA 134 (L) 04/18/2017 0950   K 3.6 04/18/2018 0632   K 3.4 (L) 04/18/2017 0950   CL 99 04/18/2018 0632   CO2 28 04/18/2018 0632   CO2 26 04/18/2017 0950   GLUCOSE 59 (L) 04/18/2018 0632   GLUCOSE 139 04/18/2017 0950   BUN 65 (H) 04/18/2018 0632   BUN 53.9 (H) 04/18/2017 0950   CREATININE 3.11 (H) 04/18/2018 0632   CREATININE 2.4 (H) 04/18/2017 0950   CALCIUM 9.7 04/18/2018 0632   CALCIUM 9.1 04/18/2017 0950   PROT 7.2 04/17/2018 0916   PROT 7.4 04/18/2017 0950   ALBUMIN 3.5 04/17/2018 0916   ALBUMIN 3.4 (L) 04/18/2017 0950   AST 22 04/17/2018 0916   AST 28 04/18/2017 0950  ALT 16 04/17/2018 0916   ALT 18 04/18/2017 0950   ALKPHOS 43 04/17/2018 0916   ALKPHOS 42 04/18/2017 0950   BILITOT 0.5 04/17/2018 0916   BILITOT 0.55 04/18/2017 0950   GFRNONAA 13 (L) 04/18/2018 0632   GFRAA 16 (L)  04/18/2018 0632       Component Value Date/Time   WBC 9.0 04/18/2018 0632   RBC 3.81 (L) 04/18/2018 0632   HGB 9.7 (L) 04/18/2018 0632   HGB 12.4 04/18/2017 0950   HCT 30.9 (L) 04/18/2018 0632   HCT 36.9 04/18/2017 0950   PLT 215 04/18/2018 0632   PLT 181 04/18/2017 0950   MCV 81.1 04/18/2018 0632   MCV 88.6 04/18/2017 0950   MCH 25.5 (L) 04/18/2018 0632   MCHC 31.4 04/18/2018 0632   RDW 17.6 (H) 04/18/2018 0632   RDW 15.0 (H) 04/18/2017 0950   LYMPHSABS 1.1 04/17/2018 0916   LYMPHSABS 1.3 04/18/2017 0950   MONOABS 0.6 04/17/2018 0916   MONOABS 0.6 04/18/2017 0950   EOSABS 0.1 04/17/2018 0916   EOSABS 0.1 04/18/2017 0950   BASOSABS 0.1 04/17/2018 0916   BASOSABS 0.1 04/18/2017 0950   -------------------------------  Imaging from last 24 hours (if applicable):  Radiology interpretation: No results found.      This case was discussed with Dr. Jana Hakim. He expressed his agreement with my management of this patient.

## 2018-04-18 NOTE — Progress Notes (Signed)
ANTICOAGULATION CONSULT NOTE - Initial Consult  Pharmacy Consult for Coumadin Indication: atrial fibrillation  Allergies  Allergen Reactions  . Amlodipine Besylate Swelling    edema    Vital Signs: Temp: 98.3 F (36.8 C) (10/23 0522) Temp Source: Oral (10/23 0522) BP: 137/64 (10/23 0522) Pulse Rate: 70 (10/23 0522)  Labs: Recent Labs    04/17/18 0916 04/17/18 1321 04/18/18 0632  HGB 7.1*  --  9.7*  HCT 23.2*  --  30.9*  PLT 225  --  215  LABPROT  --  22.9* 20.9*  INR  --  2.05 1.83  CREATININE 3.01*  --  3.11*    Estimated Creatinine Clearance: 14 mL/min (A) (by C-G formula based on SCr of 3.11 mg/dL (H)).   Medical History: Past Medical History:  Diagnosis Date  . Anemia   . Anemia   . Arthritis    gout  . Atrial fibrillation ([place])    prior Sotalol - d/c'd 2/2 increased creatinine; rate control strategy  . CAD (coronary artery disease)    s/p PCI in [place] in 1/09  . Cancer [facility])    left breast  . Chronic combined systolic and diastolic heart failure (HCC)    Echocardiogram 3/12: Mild LVH, EF 58-09%, normal diastolic function, mild AI, mild MR, PASP 44, normal wall motion  . CKD (chronic kidney disease)    sees Dr. Burman Foster - Stage 3 ?  Marland Kitchen Diabetes mellitus   . Hyperlipemia   . Hypertension   . MI, old    2009  . Osteoporosis   . Pacemaker   . Pleural effusion   . Pneumonia   . PUD (peptic ulcer disease) 10/2010   duodenal ulcer    Assessment: Patient on coumadin PTA for atrial fibrillation at a dose of 2.5 mg on M/W/F/Sat and 5 mg on Sun/Tues/Thurs. Her last dose of was 5 mg last night (10/22) @ 1900. INR therapeutic on admission. 04/18/2018:  INR 1.83. INR is currently subtherapeutic.   CBC: Hgb improved from 7.1 to 9.7 after one unit of blood. Platelets are normal at 215.   No bleeding noted  No DDIs noted upon review.  CHOmod diet ordered  Goal of Therapy:  INR 2-3  Plan:  Coumadin 5 mg x 1 dose at 1800 Monitor for signs and  symptoms of bleeding Daily INR  Donnal Debar  PharmD Candidate 04/18/2018,8:24 AM

## 2018-04-18 NOTE — Progress Notes (Addendum)
Hypoglycemic Event  CBG: 52 Treatment: 15 GM carbohydrate snack  Symptoms: Hungry   Follow-up CBG: Time:0811 CBG Result:69, 113 at 0834  Possible Reasons for Event: Medication   Comments/MD notified:spoke with doctor during rounds     Yolanda Huffman L

## 2018-04-18 NOTE — Progress Notes (Addendum)
[facility] PROGRESS NOTE    Progress Note  Yolanda Huffman  HKV:[id number] DOB: 12-Jul-1940 DOA: 04/17/2018 PCP: Eulas Post, MD     Brief Narrative:   Yolanda Huffman is an 77 y.o. female past medical history of chronic atrial fibrillation CAD status post PCI with left breast cancer, chronic combined systolic and diastolic heart failure diabetes mellitus type 2 chronic kidney disease stage III with a pacemaker who has been feeling weak for over a month, she denies any blood in stool had labs done at the PCP was found to have a low hemoglobin and a high creatinine  Assessment/Plan:   Symptomatic anemia On admission her hemoglobin was 7.1 she is status post 2 units of packed red blood cells this morning is 9.7. Anemia panel showed a ferritin of 5 we will give IV iron.  FOBT negative on admission. Negative FOBT  Acute kidney injury on chronic kidney disease stage IV: With a baseline creatinine of 2.5-2.7 on admission 3.0. Creatinine is not improved continues to be greater than 3. We will hold Lasix recheck a basic metabolic panel in the morning.  Chronic combined systolic and diastolic heart failure: Hold diuretics.  Check orthostatics.  Hyperlipidemia: Continue stents.  Diabetes mellitus type 2 with chronic kidney disease stage IV and hypertension: Continue sliding scale insulin plus long-acting insulin. A1c is pending.  Chronic atrial fibrillation: Rate controlled on metoprolol continue Coumadin.   DVT prophylaxis: coumadin Family Communication:none Disposition Plan/Barrier to D/C: home in am Code Status:     Code Status Orders  (From admission, onward)         Start     Ordered   04/17/18 1651  Full code  Continuous     04/17/18 1651        Code Status History    Date Active Date Inactive Code Status Order ID Comments User Context   07/22/2017 1427 07/25/2017 2133 Full Code [id number]  Lady Deutscher, MD ED    Advance Directive Documentation    Most Recent Value  Type of Advance Directive  Healthcare Power of Attorney, Living will  Pre-existing out of facility DNR order (yellow form or pink MOST form)  -  "MOST" Form in Place?  -        IV Access:    Peripheral IV   Procedures and diagnostic studies:   No results found.   Medical Consultants:    None.  Anti-Infectives:  NOne  Subjective:    Yolanda Huffman no complains feels better  Objective:    Vitals:   04/18/18 0100 04/18/18 0150 04/18/18 0522 04/18/18 1027  BP: (!) 125/45 (!) 132/53 137/64 (!) 141/69  Pulse: 74 74 70 70  Resp: 16 16 20    Temp: 98.4 F (36.9 C) 98.8 F (37.1 C) 98.3 F (36.8 C)   TempSrc: Oral Oral Oral   SpO2: 95% 98% 98%     Intake/Output Summary (Last 24 hours) at 04/18/2018 1035 Last data filed at 04/18/2018 0902 Gross per 24 hour  Intake 1789.3 ml  Output -  Net 1789.3 ml   There were no vitals filed for this visit.  Exam: General exam: In no acute distress. Respiratory system: Good air movement and clear to auscultation. Cardiovascular system: S1 & S2 heard, RRR.  Gastrointestinal system: Abdomen is nondistended, soft and nontender.  Central nervous system: Alert and oriented. No focal neurological deficits. Extremities: No pedal edema. Skin: No rashes, lesions or ulcers Psychiatry: Judgement and insight appear normal. Mood &  affect appropriate.    Data Reviewed:    Labs: Basic Metabolic Panel: Recent Labs  Lab 04/17/18 0916 04/18/18 0632  NA 136 139  K 3.6 3.6  CL 97* 99  CO2 24 28  GLUCOSE 163* 59*  BUN 53* 65*  CREATININE 3.01* 3.11*  CALCIUM 10.1 9.7   GFR Estimated Creatinine Clearance: 14 mL/min (A) (by C-G formula based on SCr of 3.11 mg/dL (H)). Liver Function Tests: Recent Labs  Lab 04/17/18 0916  AST 22  ALT 16  ALKPHOS 43  BILITOT 0.5  PROT 7.2  ALBUMIN 3.5   No results for input(s): LIPASE, AMYLASE in the last 168 hours. No results for input(s): AMMONIA in the last 168  hours. Coagulation profile Recent Labs  Lab 04/17/18 1321 04/18/18 0632  INR 2.05 1.83    CBC: Recent Labs  Lab 04/17/18 0916 04/18/18 0632  WBC 8.5 9.0  NEUTROABS 6.6  --   HGB 7.1* 9.7*  HCT 23.2* 30.9*  MCV 76.6* 81.1  PLT 225 215   Cardiac Enzymes: No results for input(s): CKTOTAL, CKMB, CKMBINDEX, TROPONINI in the last 168 hours. BNP (last 3 results) No results for input(s): PROBNP in the last 8760 hours. CBG: Recent Labs  Lab 04/17/18 1822 04/17/18 2105 04/18/18 0753 04/18/18 0811 04/18/18 0834  GLUCAP 112* 197* 52* 69* 113*   D-Dimer: No results for input(s): DDIMER in the last 72 hours. Hgb A1c: No results for input(s): HGBA1C in the last 72 hours. Lipid Profile: No results for input(s): CHOL, HDL, LDLCALC, TRIG, CHOLHDL, LDLDIRECT in the last 72 hours. Thyroid function studies: No results for input(s): TSH, T4TOTAL, T3FREE, THYROIDAB in the last 72 hours.  Invalid input(s): FREET3 Anemia work up: Recent Labs    04/17/18 2309  VITAMINB12 585  FOLATE 47.4  FERRITIN 5*  TIBC 412  IRON 18*  RETICCTPCT 1.4   Sepsis Labs: Recent Labs  Lab 04/17/18 0916 04/18/18 0632  WBC 8.5 9.0   Microbiology No results found for this or any previous visit (from the past 240 hour(s)).   Medications:   . anastrozole  1 mg Oral Daily  . atorvastatin  40 mg Oral Daily  . calcitRIOL  0.25 mcg Oral Q M,W,F  . calcium carbonate  1 tablet Oral Daily  . furosemide  40 mg Oral QAC breakfast   And  . furosemide  20 mg Oral Q24H  . insulin aspart  0-15 Units Subcutaneous TID WC  . insulin aspart  0-5 Units Subcutaneous QHS  . insulin detemir  12 Units Subcutaneous Daily  . insulin detemir  12 Units Subcutaneous QHS  . metoprolol succinate  25 mg Oral Daily  . metoprolol succinate  50 mg Oral Daily  . multivitamin with minerals  1 tablet Oral Daily  . pantoprazole  40 mg Oral Daily  . potassium chloride  40 mEq Oral BID  . sodium chloride flush  3 mL  Intravenous Q12H  . warfarin  5 mg Oral ONCE-1800  . Warfarin - Pharmacist Dosing Inpatient   Does not apply q1800   Continuous Infusions: . sodium chloride 20 mL/hr at 04/17/18 1400  . sodium chloride Stopped (04/17/18 2335)      LOS: 1 day   Charlynne Cousins  [facility] Pager [phone]  *Please refer to [email or web address], password [email or web address] to get updated schedule on who will round on this patient, as hospitalists switch teams weekly. If 7PM-7AM, please contact night-coverage at [email or web address], password [email or web address] for any overnight needs.  04/18/2018, 10:35  AM

## 2018-04-18 NOTE — Care Management CC44 (Signed)
Condition Code 44 Documentation Completed  Patient Details  Name: CASIDY ALBERTA MRN: [id number] Date of Birth: 07-16-1940   Condition Code 44 given:  Yes Patient signature on Condition Code 44 notice:  Yes Documentation of 2 MD's agreement:  Yes Code 44 added to claim:  Yes    Leeroy Cha, RN 04/18/2018, 4:05 PM

## 2018-04-19 ENCOUNTER — Other Ambulatory Visit: Payer: Self-pay | Admitting: Oncology

## 2018-04-19 DIAGNOSIS — D649 Anemia, unspecified: Secondary | ICD-10-CM | POA: Diagnosis not present

## 2018-04-19 DIAGNOSIS — N179 Acute kidney failure, unspecified: Secondary | ICD-10-CM | POA: Diagnosis not present

## 2018-04-19 DIAGNOSIS — I5042 Chronic combined systolic (congestive) and diastolic (congestive) heart failure: Secondary | ICD-10-CM | POA: Diagnosis not present

## 2018-04-19 DIAGNOSIS — I129 Hypertensive chronic kidney disease with stage 1 through stage 4 chronic kidney disease, or unspecified chronic kidney disease: Secondary | ICD-10-CM

## 2018-04-19 DIAGNOSIS — N184 Chronic kidney disease, stage 4 (severe): Secondary | ICD-10-CM

## 2018-04-19 DIAGNOSIS — I4821 Permanent atrial fibrillation: Secondary | ICD-10-CM | POA: Diagnosis not present

## 2018-04-19 DIAGNOSIS — D500 Iron deficiency anemia secondary to blood loss (chronic): Secondary | ICD-10-CM

## 2018-04-19 DIAGNOSIS — E1122 Type 2 diabetes mellitus with diabetic chronic kidney disease: Secondary | ICD-10-CM

## 2018-04-19 LAB — BASIC METABOLIC PANEL
Anion gap: 10 (ref 5–15)
BUN: 53 mg/dL — AB (ref 8–23)
CHLORIDE: 104 mmol/L (ref 98–111)
CO2: 26 mmol/L (ref 22–32)
CREATININE: 2.97 mg/dL — AB (ref 0.44–1.00)
Calcium: 9.3 mg/dL (ref 8.9–10.3)
GFR calc Af Amer: 16 mL/min — ABNORMAL LOW (ref >60)
GFR calc non Af Amer: 14 mL/min — ABNORMAL LOW (ref >60)
Glucose, Bld: 50 mg/dL — ABNORMAL LOW (ref 70–99)
Potassium: 3.2 mmol/L — ABNORMAL LOW (ref 3.5–5.1)
Sodium: 140 mmol/L (ref 135–145)

## 2018-04-19 LAB — GLUCOSE, CAPILLARY
GLUCOSE-CAPILLARY: 93 mg/dL (ref 70–99)
GLUCOSE-CAPILLARY: 48 mg/dL — AB (ref 70–99)
Glucose-Capillary: 73 mg/dL (ref 70–99)
Glucose-Capillary: 87 mg/dL (ref 70–99)

## 2018-04-19 LAB — PROTIME-INR
INR: 2.13
PROTHROMBIN TIME: 23.6 s — AB (ref 11.4–15.2)

## 2018-04-19 MED ORDER — WARFARIN SODIUM 5 MG PO TABS: 5.0000 mg | ORAL_TABLET | Freq: Once | ORAL | Status: DC

## 2018-04-19 NOTE — Progress Notes (Signed)
ANTICOAGULATION CONSULT NOTE - Initial Consult  Pharmacy Consult for Coumadin Indication: atrial fibrillation  Allergies  Allergen Reactions  . Amlodipine Besylate Swelling    edema    Vital Signs: Temp: 97.7 F (36.5 C) (10/24 0544) Temp Source: Oral (10/24 0544) BP: 146/40 (10/24 0544) Pulse Rate: 69 (10/24 0544)  Labs: Recent Labs    04/17/18 0916 04/17/18 1321 04/18/18 0632 04/19/18 0559  HGB 7.1*  --  9.7*  --   HCT 23.2*  --  30.9*  --   PLT 225  --  215  --   LABPROT  --  22.9* 20.9* 23.6*  INR  --  2.05 1.83 2.13  CREATININE 3.01*  --  3.11* 2.97*    Estimated Creatinine Clearance: 14.7 mL/min (A) (by C-G formula based on SCr of 2.97 mg/dL (H)).   Medical History: Past Medical History:  Diagnosis Date  . Anemia   . Anemia   . Arthritis    gout  . Atrial fibrillation ([place])    prior Sotalol - d/c'd 2/2 increased creatinine; rate control strategy  . CAD (coronary artery disease)    s/p PCI in [place] in 1/09  . Cancer [facility])    left breast  . Chronic combined systolic and diastolic heart failure (HCC)    Echocardiogram 3/12: Mild LVH, EF 78-58%, normal diastolic function, mild AI, mild MR, PASP 44, normal wall motion  . CKD (chronic kidney disease)    sees Dr. Burman Foster - Stage 3 ?  Marland Kitchen Diabetes mellitus   . Hyperlipemia   . Hypertension   . MI, old    2009  . Osteoporosis   . Pacemaker   . Pleural effusion   . Pneumonia   . PUD (peptic ulcer disease) 10/2010   duodenal ulcer    Assessment: Patient on coumadin PTA for atrial fibrillation at a dose of 2.5 mg on M/W/F/Sat and 5 mg on Sun/Tues/Thurs. Her last dose of was 5 mg last night (10/23) @ 1800. INR therapeutic on admission. 04/19/2018:  INR 2.13. INR is currently therapeutic.   CBC: Hgb improved from 7.1 to 9.7 after one unit of blood. Platelets are normal at 215.   No bleeding noted  No DDIs noted upon review.  CHOmod diet ordered  Goal of Therapy:  INR 2-3  Plan:  Coumadin 5  mg x 1 dose at 1800 Monitor for signs and symptoms of bleeding Daily INR  Donnal Debar  PharmD Candidate 04/19/2018,11:15 AM

## 2018-04-19 NOTE — Progress Notes (Signed)
Patient has discharged to home on 04/19/18. Discharge instruction including medication and appointment was given to patient. Patient has no question at this time.

## 2018-04-19 NOTE — Discharge Summary (Signed)
Physician Discharge Summary  Yolanda Huffman UTM:[id number] DOB: 12-May-1941 DOA: 04/17/2018  PCP: Eulas Post, MD  Admit date: 04/17/2018 Discharge date: 04/19/2018  Admitted From: home Disposition:  Home  Recommendations for Outpatient Follow-up:  1. Follow up with Oncology hematology in 1-2 weeks 2. Please obtain BMP/CBC in one week   Home Health:No Equipment/Devices:none  Discharge Condition:stable CODE STATUS:full Diet recommendation: Heart Healthy   Brief/Interim Summary: 77 y.o. female past medical history of chronic atrial fibrillation CAD status post PCI with left breast cancer, chronic combined systolic and diastolic heart failure diabetes mellitus type 2 chronic kidney disease stage III with a pacemaker who has been feeling weak for over a month, she denies any blood in stool had labs done at the PCP was found to have a low hemoglobin and a high creatinine  Discharge Diagnoses:  Principal Problem:   Symptomatic anemia Active Problems:   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 ([place])   ATRIAL FIBRILLATION   Cardiac pacemaker in situ   Insulin dependent diabetes mellitus (HCC)   Chronic combined systolic and diastolic CHF (congestive heart failure) (HCC)   AKI (acute kidney injury) ([place])   Iron deficiency anemia due to chronic blood loss Symptomatic anemia: On admission her hemoglobin was 7.1 she was transfused 2 units of packed red blood cells her hemoglobin came up to 9.7. FOBT was checked which remain negative, she denied any overt bleeding. Her anemia panel showed a ferritin of 5 she was given IV iron.  Acute kidney injury on chronic kidney disease stage IV: With a baseline creatinine of 2.5-2.7 on admission it was 3.0 her Lasix was held and her creatinine improved she will continue her current dose of Lasix at home.  Chronic combined systolic and diastolic heart failure: Orthostatics were negative no changes made to her  medication.  Hyperlipidemia: Continue statins.  Diabetes mellitus type 2 with chronic kidney disease stage IV and hypertension: A1c is 6.4. No changes were made to her medication.  Chronic atrial fibrillation: Rate controlled continue Coumadin INR is therapeutic.     Discharge Instructions  Discharge Instructions    Diet - low sodium heart healthy   Complete by:  As directed    Increase activity slowly   Complete by:  As directed      Allergies as of 04/19/2018      Reactions   Amlodipine Besylate Swelling   edema      Medication List    TAKE these medications   anastrozole 1 MG tablet Commonly known as:  ARIMIDEX TAKE 1 TABLET BY MOUTH DAILY   atorvastatin 40 MG tablet Commonly known as:  LIPITOR TAKE ONE TABLET BY MOUTH DAILY   calcitRIOL 0.25 MCG capsule Commonly known as:  ROCALTROL Take 0.25 mcg by mouth every Monday, Wednesday, and Friday. In the morning.   calcium carbonate 500 MG chewable tablet Commonly known as:  TUMS - dosed in mg elemental calcium Chew 1 tablet by mouth daily.   colchicine 0.6 MG tablet TAKE 1 TABLET (0.6 MG TOTAL) BY MOUTH AS NEEDED AS DIRECTED. What changed:  See the new instructions.   Cranberry 500 MG Caps Take 500 mg by mouth daily with lunch.   denosumab 60 MG/ML Sosy injection Commonly known as:  PROLIA Inject 60 mg into the skin every 6 (six) months.   feeding supplement (GLUCERNA SHAKE) Liqd Take 237 mLs by mouth 2 (two) times daily between meals.   Fish Oil 1000 MG Caps Take 1,000 mg  by mouth 2 (two) times daily.   furosemide 40 MG tablet Commonly known as:  LASIX TAKE TWO TABLETS BY MOUTH EVERY MORNING AND TAKE ONE TABLET BY MOUTH EVERY EVENING What changed:  See the new instructions.   glucose blood test strip 1 each by Other route 3 (three) times daily. Use as instructed   Insulin Detemir 100 UNIT/ML Pen Commonly known as:  LEVEMIR Inject 12 Units into the skin daily.   metolazone 2.5 MG  tablet Commonly known as:  ZAROXOLYN TAKE 1 TABLET BY MOUTH EVERY MONDAY, WEDNESDAY, AND FRIDAY IN THE MORNING What changed:  See the new instructions.   metoprolol succinate 25 MG 24 hr tablet Commonly known as:  TOPROL-XL TAKE TWO TABLET BY MOUTH EVERY MORNING AND THEN ONE TABLET BY MOUTH EVERY EVENING   multivitamin with minerals Tabs tablet Take 1 tablet by mouth daily. FOR ADULT 50+   pantoprazole 40 MG tablet Commonly known as:  PROTONIX TAKE ONE TABLET BY MOUTH DAILY   potassium chloride SA 20 MEQ tablet Commonly known as:  K-DUR,KLOR-CON TAKE 2 TABLET BY MOUTH TWO TIMES A DAY WITH FOOD AND WATER. What changed:    how much to take  how to take this   warfarin 5 MG tablet Commonly known as:  COUMADIN Take as directed. If you are unsure how to take this medication, talk to your nurse or doctor. Original instructions:  Take 1/2 tablet daily except 1 on sun Tues Thurs or TAKE AS DIRECTED BY ANTICOAGULATION CLINIC What changed:  additional instructions       Allergies  Allergen Reactions  . Amlodipine Besylate Swelling    edema    Consultations:  None   Procedures/Studies:  No results found.  Subjective:  No complaints. Discharge Exam: Vitals:   04/18/18 2021 04/19/18 0544  BP: (!) 121/54 (!) 146/40  Pulse: 81 69  Resp: 15 (!) 21  Temp: 98.2 F (36.8 C) 97.7 F (36.5 C)  SpO2: 96% 98%   Vitals:   04/18/18 1027 04/18/18 1356 04/18/18 2021 04/19/18 0544  BP: (!) 141/69 (!) 141/58 (!) 121/54 (!) 146/40  Pulse: 70 76 81 69  Resp:  16 15 (!) 21  Temp:  97.7 F (36.5 C) 98.2 F (36.8 C) 97.7 F (36.5 C)  TempSrc:  Oral Oral Oral  SpO2:  100% 96% 98%    General: Pt is alert, awake, not in acute distress Cardiovascular: RRR, S1/S2 +, no rubs, no gallops Respiratory: CTA bilaterally, no wheezing, no rhonchi Abdominal: Soft, NT, ND, bowel sounds + Extremities: no edema, no cyanosis    The results of significant diagnostics from this  hospitalization (including imaging, microbiology, ancillary and laboratory) are listed below for reference.     Microbiology: No results found for this or any previous visit (from the past 240 hour(s)).   Labs: BNP (last 3 results) No results for input(s): BNP in the last 8760 hours. Basic Metabolic Panel: Recent Labs  Lab 04/17/18 0916 04/18/18 0632 04/19/18 0559  NA 136 139 140  K 3.6 3.6 3.2*  CL 97* 99 104  CO2 24 28 26   GLUCOSE 163* 59* 50*  BUN 53* 65* 53*  CREATININE 3.01* 3.11* 2.97*  CALCIUM 10.1 9.7 9.3   Liver Function Tests: Recent Labs  Lab 04/17/18 0916  AST 22  ALT 16  ALKPHOS 43  BILITOT 0.5  PROT 7.2  ALBUMIN 3.5   No results for input(s): LIPASE, AMYLASE in the last 168 hours. No results for input(s): AMMONIA  in the last 168 hours. CBC: Recent Labs  Lab 04/17/18 0916 04/18/18 0632  WBC 8.5 9.0  NEUTROABS 6.6  --   HGB 7.1* 9.7*  HCT 23.2* 30.9*  MCV 76.6* 81.1  PLT 225 215   Cardiac Enzymes: No results for input(s): CKTOTAL, CKMB, CKMBINDEX, TROPONINI in the last 168 hours. BNP: Invalid input(s): POCBNP CBG: Recent Labs  Lab 04/18/18 0834 04/18/18 1205 04/18/18 2046 04/19/18 0730 04/19/18 0808  GLUCAP 113* 93 165* 48* 73   D-Dimer No results for input(s): DDIMER in the last 72 hours. Hgb A1c Recent Labs    04/18/18 0632  HGBA1C 6.4*   Lipid Profile No results for input(s): CHOL, HDL, LDLCALC, TRIG, CHOLHDL, LDLDIRECT in the last 72 hours. Thyroid function studies No results for input(s): TSH, T4TOTAL, T3FREE, THYROIDAB in the last 72 hours.  Invalid input(s): FREET3 Anemia work up Recent Labs    04/17/18 2309  VITAMINB12 585  FOLATE 47.4  FERRITIN 5*  TIBC 412  IRON 18*  RETICCTPCT 1.4   Urinalysis    Component Value Date/Time   COLORURINE LT. YELLOW 07/01/2011 [place] 07/01/2011 1203   LABSPEC 1.010 07/01/2011 1203   PHURINE 6.0 07/01/2011 1203   GLUCOSEU NEGATIVE 07/01/2011 1203    HGBUR TRACE-LYSED 07/01/2011 1203   BILIRUBINUR neg 05/02/2013 1215   KETONESUR NEGATIVE 07/01/2011 1203   PROTEINUR 1 05/02/2013 1215   PROTEINUR NEGATIVE 09/15/2010 0449   UROBILINOGEN 0.2 05/02/2013 1215   UROBILINOGEN 0.2 07/01/2011 1203   NITRITE neg 05/02/2013 1215   NITRITE NEGATIVE 07/01/2011 1203   LEUKOCYTESUR moderate (2+) 05/02/2013 1215   Sepsis Labs Invalid input(s): PROCALCITONIN,  WBC,  LACTICIDVEN Microbiology No results found for this or any previous visit (from the past 240 hour(s)).   Time coordinating discharge: 40 minutes  SIGNED:   Charlynne Cousins, MD  [facility] 04/19/2018, 9:26 AM Pager   If 7PM-7AM, please contact night-coverage [email or web address] Password [email or web address]

## 2018-04-19 NOTE — Progress Notes (Signed)
Yolanda Huffman was sent to the ED for transfusion.  She received 2 units.  She is returning 04/26/2018.  I have added a ferritin and reticulocyte count and iron studies to that visit so that we can decide if she needs Feraheme or not.  Normally intravenous iron and erythropoietin is done through nephrology and we will follow-up on tha during this visit

## 2018-04-19 NOTE — Care Management Note (Signed)
Case Management Note  Patient Details  Name: DANAYA GEDDIS MRN: [id number] Date of Birth: 02/12/1941  Subjective/Objective:                  discharge planning  Action/Plan: [facility] with [facility] will take patient for pt and ot.  Expected Discharge Date:  04/19/18               Expected Discharge Plan:  (P) [place]  In-House Referral:     Discharge planning Services  (P) CM Consult  Post Acute Care Choice:  (P) Home Health Choice offered to:  (P) Patient  DME Arranged:    DME Agency:     HH Arranged:  (P) PT, OT HH Agency:     Status of Service:     If discussed at [place] of Stay Meetings, dates discussed:    Additional Comments:  Leeroy Cha, RN 04/19/2018, 10:54 AM

## 2018-04-19 NOTE — Evaluation (Signed)
Physical Therapy One Time Evaluation Patient Details Name: Yolanda Huffman MRN: [id number] DOB: 1940-08-30 Today's Date: 04/19/2018   History of Present Illness  77 y.o. female past medical history of chronic atrial fibrillation, CAD status post PCI, left breast cancer, chronic combined systolic and diastolic heart failure, diabetes mellitus type 2, chronic kidney disease stage III, pacemaker and admitted for symptomatic anemia  Clinical Impression  Patient evaluated by Physical Therapy with no further acute PT needs identified. All education has been completed and the patient has no further questions. Pt reports feeling better however c/o generalized weakness.  Pt able to ambulate in hallway with her quad cane (adamantly declines using RW).  Pt agreeable to HHPT.  Pt to d/c home today. PT is signing off. Thank you for this referral.     Follow Up Recommendations Home health PT    Equipment Recommendations  None recommended by PT    Recommendations for Other Services       Precautions / Restrictions Precautions Precautions: None      Mobility  Bed Mobility               General bed mobility comments: sitting EOB on arrival  Transfers Overall transfer level: Needs assistance Equipment used: Quad cane Transfers: Sit to/from Stand Sit to Stand: Supervision;Min guard            Ambulation/Gait Ambulation/Gait assistance: Min Gaffer (Feet): 160 Feet Assistive device: Quad cane Gait Pattern/deviations: Step-through pattern;Decreased stride length     General Gait Details: pt declines using RW so instead she utilized hand rail in hallway (admits to occasional furniture walking at home as well), no unsteadiness observed  Stairs            Wheelchair Mobility    Modified Rankin (Stroke Patients Only)       Balance Overall balance assessment: Mild deficits observed, not formally tested                                            Pertinent Vitals/Pain Pain Assessment: No/denies pain    Home Living Family/patient expects to be discharged to:: Private residence Living Arrangements: Alone   Type of Home: Apartment       Home Layout: One level Home Equipment: Cane - quad;Grab bars - tub/shower      Prior Function Level of Independence: Independent with assistive device(s)         Comments: using quad cane for ambulation      Hand Dominance        Extremity/Trunk Assessment        Lower Extremity Assessment Lower Extremity Assessment: Generalized weakness       Communication   Communication: No difficulties  Cognition Arousal/Alertness: Awake/alert Behavior During Therapy: WFL for tasks assessed/performed Overall Cognitive Status: Within Functional Limits for tasks assessed                                        General Comments      Exercises     Assessment/Plan    PT Assessment All further PT needs can be met in the next venue of care  PT Problem List Decreased strength;Decreased mobility;Decreased activity tolerance;Decreased balance;Decreased knowledge of use of DME       PT Treatment Interventions  PT Goals (Current goals can be found in the Care Plan section)  Acute Rehab PT Goals PT Goal Formulation: All assessment and education complete, DC therapy    Frequency     Barriers to discharge        Co-evaluation               AM-PAC PT "6 Clicks" Daily Activity  Outcome Measure Difficulty turning over in bed (including adjusting bedclothes, sheets and blankets)?: None Difficulty moving from lying on back to sitting on the side of the bed? : None Difficulty sitting down on and standing up from a chair with arms (e.g., wheelchair, bedside commode, etc,.)?: None Help needed moving to and from a bed to chair (including a wheelchair)?: A Little Help needed walking in hospital room?: A Little Help needed climbing 3-5 steps with  a railing? : A Little 6 Click Score: 21    End of Session Equipment Utilized During Treatment: Gait belt Activity Tolerance: Patient tolerated treatment well Patient left: Other (comment);with call bell/phone within reach(bathroom, pt aware to use pull cord for assist off toilet) Nurse Communication: Mobility status PT Visit Diagnosis: Other abnormalities of gait and mobility (R26.89)    Time: 0920-0944 PT Time Calculation (min) (ACUTE ONLY): 24 min   Charges:   PT Evaluation $PT Eval Low Complexity: 1 Low        Kati , PT, DPT Acute Rehabilitation Services Office: [phone] Pager: [phone]   ,KATHrine E 04/19/2018, 12:50 PM   

## 2018-04-20 ENCOUNTER — Telehealth: Payer: Self-pay

## 2018-04-20 NOTE — Telephone Encounter (Signed)
Spoke with daughter after returning her call concerning r/s of her appointment with L. [facility].  per 10/25 vm return calls. Mailed a letter with a calender enclosed.

## 2018-04-23 ENCOUNTER — Telehealth: Payer: Self-pay | Admitting: Oncology

## 2018-04-23 ENCOUNTER — Telehealth: Payer: Self-pay

## 2018-04-23 NOTE — Telephone Encounter (Signed)
Daughter called in reference to injection on 11/1. How ever the inj. Is scheduled with [facility]. Left a voice msg to call [facility]. Per 10/28 return vm calls

## 2018-04-23 NOTE — Telephone Encounter (Signed)
R/s appt per 10/25 sch message - pt daughter is aware of appt date and time.

## 2018-04-26 ENCOUNTER — Ambulatory Visit: Payer: Medicare Other | Admitting: Adult Health

## 2018-04-26 ENCOUNTER — Other Ambulatory Visit: Payer: Self-pay

## 2018-04-26 ENCOUNTER — Ambulatory Visit: Payer: Medicare Other

## 2018-04-26 DIAGNOSIS — I340 Nonrheumatic mitral (valve) insufficiency: Secondary | ICD-10-CM

## 2018-04-26 LAB — BASIC METABOLIC PANEL
BUN: 42 — AB (ref 4–21)
CREATININE: 2.3 — AB (ref <=1.1)
GLUCOSE: 79
POTASSIUM: 4 (ref 3.4–5.3)
SODIUM: 135 — AB (ref 137–147)

## 2018-04-26 LAB — IRON,TIBC AND FERRITIN PANEL
Ferritin: 341
IRON: 63
TIBC: 354
UIBC: 291

## 2018-04-26 LAB — HEPATIC FUNCTION PANEL
ALT: 16 (ref 7–35)
AST: 24 (ref 13–35)
Alkaline Phosphatase: 45 (ref 25–125)
Bilirubin, Total: 0.4

## 2018-04-26 LAB — CBC AND DIFFERENTIAL: WBC: 8.7

## 2018-04-26 NOTE — Progress Notes (Signed)
Per Dr. Sammuel Hines Pt to Dr. Stanford Breed for multiple valve problems/consideration for TEE.

## 2018-04-27 ENCOUNTER — Other Ambulatory Visit: Payer: Self-pay | Admitting: Oncology

## 2018-04-27 ENCOUNTER — Ambulatory Visit: Payer: Medicare Other

## 2018-04-27 ENCOUNTER — Ambulatory Visit: Payer: Medicare Other | Admitting: Family Medicine

## 2018-04-27 ENCOUNTER — Encounter: Payer: Self-pay | Admitting: Family Medicine

## 2018-04-27 ENCOUNTER — Other Ambulatory Visit: Payer: Self-pay

## 2018-04-27 VITALS — BP 130/68 | HR 79 | Temp 97.7°F | Ht 63.0 in | Wt 149.9 lb

## 2018-04-27 DIAGNOSIS — Z23 Encounter for immunization: Secondary | ICD-10-CM | POA: Diagnosis not present

## 2018-04-27 DIAGNOSIS — D649 Anemia, unspecified: Secondary | ICD-10-CM | POA: Diagnosis not present

## 2018-04-27 DIAGNOSIS — N184 Chronic kidney disease, stage 4 (severe): Secondary | ICD-10-CM

## 2018-04-27 LAB — CBC WITH DIFFERENTIAL/PLATELET
BASOS PCT: 1.3 % (ref 0.0–3.0)
Basophils Absolute: 0.1 10*3/uL (ref 0.0–0.1)
Eosinophils Absolute: 0.1 10*3/uL (ref 0.0–0.7)
Eosinophils Relative: 1.1 % (ref 0.0–5.0)
HCT: 36 % (ref 36.0–46.0)
HEMOGLOBIN: 11.5 g/dL — AB (ref 12.0–15.0)
LYMPHS ABS: 1 10*3/uL (ref 0.7–4.0)
Lymphocytes Relative: 14.4 % (ref 12.0–46.0)
MCHC: 32.1 g/dL (ref 30.0–36.0)
MCV: 81.4 fl (ref 78.0–100.0)
MONO ABS: 0.5 10*3/uL (ref 0.1–1.0)
Monocytes Relative: 7.2 % (ref 3.0–12.0)
Neutro Abs: 5.2 10*3/uL (ref 1.4–7.7)
Neutrophils Relative %: 76 % (ref 43.0–77.0)
Platelets: 226 10*3/uL (ref 150.0–400.0)
RBC: 4.43 Mil/uL (ref 3.87–5.11)
RDW: 24.6 % — AB (ref 11.5–15.5)
WBC: 6.8 10*3/uL (ref 4.0–10.5)

## 2018-04-27 NOTE — Progress Notes (Signed)
Subjective:     Patient ID: Yolanda Huffman, female   DOB: 1940-12-09, 77 y.o.   MRN: [id number]  HPI   Patient seen for hospital follow-up.  She has multiple chronic problems including history of chronic kidney disease, atrial fibrillation, type 2 diabetes, hyperlipidemia.  History of breast cancer.  She was going to get Prolia injection.  She had some labs in advance and hemoglobin came back 7.1.  She had been having some fatigue issues.  This compared with hemoglobin 11.7 about 6 months prior.  She had a ferritin level 5 and low serum iron.  Patient received 2 units of packed red blood cells and also IV iron.  Her hemoglobin after transfusion was 9.7.  She is on Coumadin chronically.  INR has been stable.  Patient denies any recent bloody stools.  She had colonoscopy December 2017.  She has follow-up with hematology scheduled.  She states she saw her nephrologist yesterday and had presumably renal function studies done then.  She is not sure if CBC was done then.  Overall feels much better now with more energy and no dyspnea.  Appetite and weight are stable  Past Medical History:  Diagnosis Date  . Anemia   . Anemia   . Arthritis    gout  . Atrial fibrillation ([place])    prior Sotalol - d/c'd 2/2 increased creatinine; rate control strategy  . CAD (coronary artery disease)    s/p PCI in [place] in 1/09  . Cancer [facility])    left breast  . Chronic combined systolic and diastolic heart failure (HCC)    Echocardiogram 3/12: Mild LVH, EF 53-97%, normal diastolic function, mild AI, mild MR, PASP 44, normal wall motion  . CKD (chronic kidney disease)    sees Dr. Burman Foster - Stage 3 ?  Marland Kitchen Diabetes mellitus   . Hyperlipemia   . Hypertension   . MI, old    2009  . Osteoporosis   . Pacemaker   . Pleural effusion   . Pneumonia   . PUD (peptic ulcer disease) 10/2010   duodenal ulcer   Past Surgical History:  Procedure Laterality Date  . BREAST BIOPSY Left 07/29/2016  . BREAST LUMPECTOMY Left  09/19/2016  . BREAST LUMPECTOMY WITH RADIOACTIVE SEED LOCALIZATION Left 09/19/2016   Procedure: LEFT BREAST LUMPECTOMY WITH RADIOACTIVE SEED LOCALIZATION;  Surgeon: Autumn Messing III, MD;  Location: [facility];  Service: General;  Laterality: Left;  . CATARACT EXTRACTION Bilateral    01/12/09 and 01/26/09 both eyes  . COLONOSCOPY    . CORONARY ANGIOPLASTY  2009  . PACEMAKER INSERTION  07/13/07  . stent implant  07/13/2007  . TOTAL HIP ARTHROPLASTY Right 07/23/2017   Procedure: TOTAL HIP ARTHROPLASTY ANTERIOR APPROACH;  Surgeon: Mcarthur Rossetti, MD;  Location: [facility];  Service: Orthopedics;  Laterality: Right;    reports that she quit smoking about 26 years ago. Her smoking use included cigarettes. She has a 40.00 pack-year smoking history. She has never used smokeless tobacco. She reports that she does not drink alcohol or use drugs. family history includes Breast cancer (age of onset: 80) in her other; Breast cancer (age of onset: 30) in her maternal aunt; Breast cancer (age of onset: 30) in her sister; Clotting disorder in her father; Heart attack in her father; Heart disease in her father and mother; Hypertension in her father. Allergies  Allergen Reactions  . Amlodipine Besylate Swelling    edema     Review of Systems  Constitutional: Negative for  fatigue.  Eyes: Negative for visual disturbance.  Respiratory: Negative for cough, chest tightness, shortness of breath and wheezing.   Cardiovascular: Negative for chest pain, palpitations and leg swelling.  Neurological: Negative for dizziness, seizures, syncope, weakness, light-headedness and headaches.       Objective:   Physical Exam  Constitutional: She is oriented to person, place, and time. She appears well-developed and well-nourished.  HENT:  Mouth/Throat: Oropharynx is clear and moist.  Neck: Neck supple.  Cardiovascular: Normal rate and regular rhythm.  Pulmonary/Chest: Effort normal and breath sounds normal.  Abdominal: Bowel  sounds are normal. There is no tenderness.  Lymphadenopathy:    She has no cervical adenopathy.  Neurological: She is alert and oriented to person, place, and time.  Psychiatric: She has a normal mood and affect.       Assessment:     Patient presented recently with acute iron deficiency anemia.  Etiology unclear.  ?AVMs vs other.  Hemoccults were apparently negative.  She denies any obvious blood loss. Improving clinically after blood and iron transfusion.    Plan:     -Repeat CBC today -Flu vaccine given -Continue close follow-up with hematology -Keep scheduled follow-up for December -Follow-up immediately for any increased shortness of breath, dizziness, or other concerns  Eulas Post MD [facility]

## 2018-04-30 ENCOUNTER — Other Ambulatory Visit: Payer: Self-pay | Admitting: Adult Health

## 2018-04-30 ENCOUNTER — Other Ambulatory Visit: Payer: Self-pay | Admitting: Oncology

## 2018-04-30 ENCOUNTER — Telehealth: Payer: Self-pay | Admitting: Oncology

## 2018-04-30 ENCOUNTER — Inpatient Hospital Stay: Payer: Medicare Other | Attending: Oncology | Admitting: Adult Health

## 2018-04-30 ENCOUNTER — Encounter: Payer: Self-pay | Admitting: Adult Health

## 2018-04-30 ENCOUNTER — Inpatient Hospital Stay: Payer: Medicare Other

## 2018-04-30 VITALS — BP 139/65 | HR 88 | Temp 97.8°F | Resp 17 | Ht 63.0 in | Wt 148.9 lb

## 2018-04-30 VITALS — BP 148/56 | HR 70 | Temp 97.8°F | Resp 18

## 2018-04-30 DIAGNOSIS — D509 Iron deficiency anemia, unspecified: Secondary | ICD-10-CM | POA: Diagnosis present

## 2018-04-30 DIAGNOSIS — M810 Age-related osteoporosis without current pathological fracture: Secondary | ICD-10-CM

## 2018-04-30 DIAGNOSIS — Z79811 Long term (current) use of aromatase inhibitors: Secondary | ICD-10-CM | POA: Insufficient documentation

## 2018-04-30 DIAGNOSIS — C50212 Malignant neoplasm of upper-inner quadrant of left female breast: Secondary | ICD-10-CM | POA: Insufficient documentation

## 2018-04-30 DIAGNOSIS — D0512 Intraductal carcinoma in situ of left breast: Secondary | ICD-10-CM | POA: Diagnosis not present

## 2018-04-30 DIAGNOSIS — Z170 Estrogen receptor positive status [ER+]: Secondary | ICD-10-CM | POA: Insufficient documentation

## 2018-04-30 DIAGNOSIS — Z87891 Personal history of nicotine dependence: Secondary | ICD-10-CM

## 2018-04-30 DIAGNOSIS — M818 Other osteoporosis without current pathological fracture: Secondary | ICD-10-CM | POA: Insufficient documentation

## 2018-04-30 DIAGNOSIS — D500 Iron deficiency anemia secondary to blood loss (chronic): Secondary | ICD-10-CM

## 2018-04-30 MED ORDER — DENOSUMAB 60 MG/ML ~~LOC~~ SOSY
PREFILLED_SYRINGE | SUBCUTANEOUS | Status: AC
  Filled 2018-04-30: qty 1

## 2018-04-30 MED ORDER — SODIUM CHLORIDE 0.9 % IV SOLN
510.0000 mg | Freq: Once | INTRAVENOUS | Status: AC
  Administered 2018-04-30: 510 mg via INTRAVENOUS
  Filled 2018-04-30: qty 17

## 2018-04-30 MED ORDER — DENOSUMAB 60 MG/ML ~~LOC~~ SOSY
60.0000 mg | PREFILLED_SYRINGE | Freq: Once | SUBCUTANEOUS | Status: AC
  Administered 2018-04-30: 60 mg via SUBCUTANEOUS

## 2018-04-30 MED ORDER — SODIUM CHLORIDE 0.9 % IV SOLN
INTRAVENOUS | Status: DC
  Administered 2018-04-30: 10:00:00 via INTRAVENOUS
  Filled 2018-04-30: qty 250

## 2018-04-30 NOTE — Patient Instructions (Addendum)
Ferumoxytol injection What is this medicine? FERUMOXYTOL is an iron complex. Iron is used to make healthy red blood cells, which carry oxygen and nutrients throughout the body. This medicine is used to treat iron deficiency anemia in people with chronic kidney disease. This medicine may be used for other purposes; ask your health care provider or pharmacist if you have questions. COMMON BRAND NAME(S): Feraheme What should I tell my health care provider before I take this medicine? They need to know if you have any of these conditions: -anemia not caused by low iron levels -high levels of iron in the blood -magnetic resonance imaging (MRI) test scheduled -an unusual or allergic reaction to iron, other medicines, foods, dyes, or preservatives -pregnant or trying to get pregnant -breast-feeding How should I use this medicine? This medicine is for injection into a vein. It is given by a health care professional in a hospital or clinic setting. Talk to your pediatrician regarding the use of this medicine in children. Special care may be needed. Overdosage: If you think you have taken too much of this medicine contact a poison control center or emergency room at once. NOTE: This medicine is only for you. Do not share this medicine with others. What if I miss a dose? It is important not to miss your dose. Call your doctor or health care professional if you are unable to keep an appointment. What may interact with this medicine? This medicine may interact with the following medications: -other iron products This list may not describe all possible interactions. Give your health care provider a list of all the medicines, herbs, non-prescription drugs, or dietary supplements you use. Also tell them if you smoke, drink alcohol, or use illegal drugs. Some items may interact with your medicine. What should I watch for while using this medicine? Visit your doctor or healthcare professional regularly. Tell  your doctor or healthcare professional if your symptoms do not start to get better or if they get worse. You may need blood work done while you are taking this medicine. You may need to follow a special diet. Talk to your doctor. Foods that contain iron include: whole grains/cereals, dried fruits, beans, or peas, leafy green vegetables, and organ meats (liver, kidney). What side effects may I notice from receiving this medicine? Side effects that you should report to your doctor or health care professional as soon as possible: -allergic reactions like skin rash, itching or hives, swelling of the face, lips, or tongue -breathing problems -changes in blood pressure -feeling faint or lightheaded, falls -fever or chills -flushing, sweating, or hot feelings -swelling of the ankles or feet Side effects that usually do not require medical attention (report to your doctor or health care professional if they continue or are bothersome): -diarrhea -headache -nausea, vomiting -stomach pain This list may not describe all possible side effects. Call your doctor for medical advice about side effects. You may report side effects to FDA at 1-800-FDA-1088. Where should I keep my medicine? This drug is given in a hospital or clinic and will not be stored at home. NOTE: This sheet is a summary. It may not cover all possible information. If you have questions about this medicine, talk to your doctor, pharmacist, or health care provider.  2018 Elsevier/Gold Standard (2015-07-16 12:41:49)  Denosumab injection What is this medicine? DENOSUMAB (den oh sue mab) slows bone breakdown. Prolia is used to treat osteoporosis in women after menopause and in men. Delton See is used to treat a high calcium  level due to cancer and to prevent bone fractures and other bone problems caused by multiple myeloma or cancer bone metastases. Delton See is also used to treat giant cell tumor of the bone. This medicine may be used for other  purposes; ask your health care provider or pharmacist if you have questions. COMMON BRAND NAME(S): Prolia, XGEVA What should I tell my health care provider before I take this medicine? They need to know if you have any of these conditions: -dental disease -having surgery or tooth extraction -infection -kidney disease -low levels of calcium or Vitamin D in the blood -malnutrition -on hemodialysis -skin conditions or sensitivity -thyroid or parathyroid disease -an unusual reaction to denosumab, other medicines, foods, dyes, or preservatives -pregnant or trying to get pregnant -breast-feeding How should I use this medicine? This medicine is for injection under the skin. It is given by a health care professional in a hospital or clinic setting. If you are getting Prolia, a special MedGuide will be given to you by the pharmacist with each prescription and refill. Be sure to read this information carefully each time. For Prolia, talk to your pediatrician regarding the use of this medicine in children. Special care may be needed. For Delton See, talk to your pediatrician regarding the use of this medicine in children. While this drug may be prescribed for children as young as 13 years for selected conditions, precautions do apply. Overdosage: If you think you have taken too much of this medicine contact a poison control center or emergency room at once. NOTE: This medicine is only for you. Do not share this medicine with others. What if I miss a dose? It is important not to miss your dose. Call your doctor or health care professional if you are unable to keep an appointment. What may interact with this medicine? Do not take this medicine with any of the following medications: -other medicines containing denosumab This medicine may also interact with the following medications: -medicines that lower your chance of fighting infection -steroid medicines like prednisone or cortisone This list may not  describe all possible interactions. Give your health care provider a list of all the medicines, herbs, non-prescription drugs, or dietary supplements you use. Also tell them if you smoke, drink alcohol, or use illegal drugs. Some items may interact with your medicine. What should I watch for while using this medicine? Visit your doctor or health care professional for regular checks on your progress. Your doctor or health care professional may order blood tests and other tests to see how you are doing. Call your doctor or health care professional for advice if you get a fever, chills or sore throat, or other symptoms of a cold or flu. Do not treat yourself. This drug may decrease your body's ability to fight infection. Try to avoid being around people who are sick. You should make sure you get enough calcium and vitamin D while you are taking this medicine, unless your doctor tells you not to. Discuss the foods you eat and the vitamins you take with your health care professional. See your dentist regularly. Brush and floss your teeth as directed. Before you have any dental work done, tell your dentist you are receiving this medicine. Do not become pregnant while taking this medicine or for 5 months after stopping it. Talk with your doctor or health care professional about your birth control options while taking this medicine. Women should inform their doctor if they wish to become pregnant or think they might be  pregnant. There is a potential for serious side effects to an unborn child. Talk to your health care professional or pharmacist for more information. What side effects may I notice from receiving this medicine? Side effects that you should report to your doctor or health care professional as soon as possible: -allergic reactions like skin rash, itching or hives, swelling of the face, lips, or tongue -bone pain -breathing problems -dizziness -jaw pain, especially after dental work -redness,  blistering, peeling of the skin -signs and symptoms of infection like fever or chills; cough; sore throat; pain or trouble passing urine -signs of low calcium like fast heartbeat, muscle cramps or muscle pain; pain, tingling, numbness in the hands or feet; seizures -unusual bleeding or bruising -unusually weak or tired Side effects that usually do not require medical attention (report to your doctor or health care professional if they continue or are bothersome): -constipation -diarrhea -headache -joint pain -loss of appetite -muscle pain -runny nose -tiredness -upset stomach This list may not describe all possible side effects. Call your doctor for medical advice about side effects. You may report side effects to FDA at 1-800-FDA-1088. Where should I keep my medicine? This medicine is only given in a clinic, doctor's office, or other health care setting and will not be stored at home. NOTE: This sheet is a summary. It may not cover all possible information. If you have questions about this medicine, talk to your doctor, pharmacist, or health care provider.  2018 Elsevier/Gold Standard (2016-07-05 19:17:21)

## 2018-04-30 NOTE — Telephone Encounter (Signed)
Per 11/4 los, mailed calendar.

## 2018-04-30 NOTE — Progress Notes (Addendum)
[place]  [phone] [phone]     ID: Yolanda Huffman DOB: March 13, 1941  MR#: [id number]  HYQ#:[id number]  Patient Care Team: Eulas Post, MD as PCP - General (Family Medicine) Donato Heinz, MD as Consulting Physician (Nephrology) Magrinat, Virgie Dad, MD as Consulting Physician (Oncology) Jovita Kussmaul, MD as Consulting Physician (General Surgery) Delice Bison, Charlestine Massed, NP as Nurse Practitioner (Hematology and Oncology) Mcarthur Rossetti, MD as Consulting Physician (Orthopedic Surgery) Scot Dock, NP OTHER MD:  CHIEF COMPLAINT: Ductal carcinoma in situ   CURRENT TREATMENT: Anastrozole, denosumab/Prolia   BREAST CANCER HISTORY: From the original intake note:  The patient had routine bilateral screening mammography with tomography at the [facility] 07/06/2016, showing new calcifications in the left breast. The right breast was unremarkable. Unilateral left diagnostic mammography 07/27/2016 found the breast density to be category B. In the upper inner quadrant of the left breast there was a 0.7 cm group of pleomorphic calcifications.  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  Her subsequent history is as detailed below  INTERVAL HISTORY: Yolanda Huffman returns today for f/u prior to receiving IV iron.  She was recently hospitalized for two days and was discharged on 04/19/2018.  She received IV iron during her admission and two units of PRBCs.  She was noted to have hemoccult negative stool.  She has an appointment with nephrology on 05/23/2018.     REVIEW OF SYSTEMS: Yolanda Huffman is feeling much better than she did while she was in the hospital.  She tells me that she tolerated the feraheme well.  She denies any unusual headaches, nausea vomiting, vision issues.  She is without  chest pain, palpitations cough, or shortness of breath.  She is much improved from how she felt last week.  A detailed ROS was otherwise non contributory.    PAST MEDICAL HISTORY: Past Medical History:  Diagnosis Date  . Anemia   . Anemia   . Arthritis    gout  . Atrial fibrillation ([place])    prior Sotalol - d/c'd 2/2 increased creatinine; rate control strategy  . CAD (coronary artery disease)    s/p PCI in [place] in 1/09  . Cancer [facility])    left breast  . Chronic combined systolic and diastolic heart failure (HCC)    Echocardiogram 3/12: Mild LVH, EF 95-28%, normal diastolic function, mild AI, mild MR, PASP 44, normal wall motion  . CKD (chronic kidney disease)    sees Dr. Burman Foster - Stage 3 ?  Marland Kitchen Diabetes mellitus   . Hyperlipemia   . Hypertension   . MI, old    2009  . Osteoporosis   . Pacemaker   . Pleural effusion   . Pneumonia   . PUD (peptic ulcer disease) 10/2010   duodenal ulcer    PAST SURGICAL HISTORY: Past Surgical History:  Procedure Laterality Date  . BREAST BIOPSY Left 07/29/2016  . BREAST LUMPECTOMY Left 09/19/2016  . BREAST LUMPECTOMY WITH RADIOACTIVE SEED LOCALIZATION Left 09/19/2016   Procedure: LEFT BREAST LUMPECTOMY WITH RADIOACTIVE SEED LOCALIZATION;  Surgeon: Autumn Messing III, MD;  Location: [facility];  Service: General;  Laterality: Left;  . CATARACT EXTRACTION Bilateral    01/12/09 and 01/26/09 both eyes  . COLONOSCOPY    . CORONARY ANGIOPLASTY  2009  . PACEMAKER INSERTION  07/13/07  . stent implant  07/13/2007  .  TOTAL HIP ARTHROPLASTY Right 07/23/2017   Procedure: TOTAL HIP ARTHROPLASTY ANTERIOR APPROACH;  Surgeon: Blackman, Christopher Y, MD;  Location: [facility];  Service: Orthopedics;  Laterality: Right;    FAMILY HISTORY Family History  Problem Relation Age of Onset  . Heart disease Father   . Heart attack Father   . Clotting disorder Father        blood clot  . Hypertension Father   . Heart disease Mother   . Breast cancer Maternal Aunt 50  .  Breast cancer Sister 68  . Breast cancer Other 40  . Colon cancer Neg Hx   The patient's father died at age 72 from a myocardial infarction. The patient's mother died from multiple problems including pleurisy at the age of 78. The patient has 3 brothers, 2 sisters. One sister was diagnosed with breast cancer in her late 60s. The other sister had a "vaginal cancer" (? Cervical cancer). A maternal aunt was diagnosed with breast cancer in her 50s and her Huffman, the patient's niece was diagnosed with breast cancer in her 30s.  GYNECOLOGIC HISTORY:  No LMP recorded. Patient is postmenopausal. Menarche age 12, first live birth age 26, the patient is GX P1. She stopped having periods in 1992. She did not take hormone replacement. She took oral contraceptives briefly remotely with no complications.  SOCIAL HISTORY:  She is originally from [place]. 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 She is a widow. She lives with her cat, Chrissy,  in a retirement community. Her Huffman Sonia Beach lives in [place]. 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 The patient has no grandchildren. She is not a church attender    ADVANCED DIRECTIVES: The patient's Huffman Sonia is her healthcare power of attorney. Sonia can be reached at [phone] or [phone]   HEALTH MAINTENANCE: Social History   Tobacco Use  . Smoking status: Former Smoker    Packs/day: 2.00    Years: 20.00    Pack years: 40.00    Types: Cigarettes    Last attempt to quit: 11/26/1991    Years since quitting: 26.4  . Smokeless tobacco: Never Used  Substance Use Topics  . Alcohol use: No  . Drug use: No     Colonoscopy:January 2017/ [facility]  PAP:  Bone density: March 2017 showed a T score of -2.7 (osteoporosis) right femoral neck   Allergies  Allergen Reactions  . Amlodipine Besylate Swelling    edema    Current Outpatient Medications  Medication Sig Dispense Refill  . anastrozole (ARIMIDEX) 1 MG tablet TAKE 1 TABLET BY MOUTH DAILY  90 tablet 3  . atorvastatin (LIPITOR) 40 MG tablet TAKE ONE TABLET BY MOUTH DAILY 90 tablet 0  . calcitRIOL (ROCALTROL) 0.25 MCG capsule Take 0.25 mcg by mouth every Monday, Wednesday, and Friday. In the morning.    . calcium carbonate (TUMS - DOSED IN MG ELEMENTAL CALCIUM) 500 MG chewable tablet Chew 1 tablet by mouth daily.    . colchicine 0.6 MG tablet TAKE 1 TABLET (0.6 MG TOTAL) BY MOUTH AS NEEDED AS DIRECTED. (Patient taking differently: Take 0.6 mg by mouth daily as needed (gout flare). ) 30 tablet 4  . Cranberry 500 MG CAPS Take 500 mg by mouth daily with lunch.    . denosumab (PROLIA) 60 MG/ML SOSY injection Inject 60 mg into the skin every 6 (six) months.    . feeding supplement, GLUCERNA SHAKE, (GLUCERNA SHAKE) LIQD Take 237 mLs by mouth 2 (two) times daily between meals.    0  . furosemide (LASIX) 40 MG tablet TAKE TWO TABLETS BY MOUTH EVERY MORNING AND TAKE ONE TABLET BY MOUTH EVERY EVENING (Patient taking differently: Take 20-40 mg by mouth. 40 mg in the am and 20 mg at lunch time) 270 tablet 1  . glucose blood test strip 1 each by Other route 3 (three) times daily. Use as instructed     . Insulin Detemir (LEVEMIR FLEXTOUCH) 100 UNIT/ML Pen Inject 12 Units into the skin daily. 15 pen 2  . metolazone (ZAROXOLYN) 2.5 MG tablet TAKE 1 TABLET BY MOUTH EVERY MONDAY, WEDNESDAY, AND FRIDAY IN THE MORNING (Patient taking differently: Take 2.5 mg by mouth. Mon, Wed and Fri) 36 tablet 0  . metoprolol succinate (TOPROL-XL) 25 MG 24 hr tablet TAKE TWO TABLET BY MOUTH EVERY MORNING AND THEN ONE TABLET BY MOUTH EVERY EVENING 270 tablet 0  . Multiple Vitamin (MULTIVITAMIN WITH MINERALS) TABS tablet Take 1 tablet by mouth daily. FOR ADULT 50+    . Omega-3 Fatty Acids (FISH OIL) 1000 MG CAPS Take 1,000 mg by mouth 2 (two) times daily.    . pantoprazole (PROTONIX) 40 MG tablet TAKE ONE TABLET BY MOUTH DAILY 90 tablet 4  . potassium chloride SA (K-DUR,KLOR-CON) 20 MEQ tablet TAKE 2 TABLET BY MOUTH TWO TIMES  A DAY WITH FOOD AND WATER. (Patient taking differently: Take 40 mEq by mouth. TAKE 2 TABLET BY MOUTH TWO TIMES A DAY WITH FOOD AND WATER.) 360 tablet 3  . warfarin (COUMADIN) 5 MG tablet Take 1/2 tablet daily except 1 on sun Tues Thurs or TAKE AS DIRECTED BY ANTICOAGULATION CLINIC (Patient taking differently: 2.5 mg on Mon, Wed, Fri and Sat 5 mg on Tues Thurs Sun) 30 tablet 3   No current facility-administered medications for this visit.     OBJECTIVE: Elderly white woman who appears stated age  89:   04/30/18 0826  BP: 139/65  Pulse: 88  Resp: 17  Temp: 97.8 F (36.6 C)  SpO2: 100%     Body mass index is 26.38 kg/m.    ECOG FS:1 - Symptomatic but completely ambulatory GENERAL: Patient is a well appearing female in no acute distress HEENT:  Sclerae anicteric.  Oropharynx clear and moist. No ulcerations or evidence of oropharyngeal candidiasis. Neck is supple.  NODES:  No cervical, supraclavicular, or axillary lymphadenopathy palpated.  BREAST EXAM:  Deferred. LUNGS:  Clear to auscultation bilaterally.  No wheezes or rhonchi. HEART:  Regular rate and rhythm. No murmur appreciated. ABDOMEN:  Soft, nontender.  Positive, normoactive bowel sounds. No organomegaly palpated. MSK:  No focal spinal tenderness to palpation. Full range of motion bilaterally in the upper extremities. EXTREMITIES:  No peripheral edema.   SKIN:  Clear with no obvious rashes or skin changes. No nail dyscrasia. NEURO:  Nonfocal. Well oriented.  Appropriate affect.    LAB RESULTS:  CMP     Component Value Date/Time   NA 140 04/19/2018 0559   NA 134 (L) 04/18/2017 0950   K 3.2 (L) 04/19/2018 0559   K 3.4 (L) 04/18/2017 0950   CL 104 04/19/2018 0559   CO2 26 04/19/2018 0559   CO2 26 04/18/2017 0950   GLUCOSE 50 (L) 04/19/2018 0559   GLUCOSE 139 04/18/2017 0950   BUN 53 (H) 04/19/2018 0559   BUN 53.9 (H) 04/18/2017 0950   CREATININE 2.97 (H) 04/19/2018 0559   CREATININE 2.4 (H) 04/18/2017 0950    CALCIUM 9.3 04/19/2018 0559   CALCIUM 9.1 04/18/2017 0950   PROT 7.2  04/17/2018 0916   PROT 7.4 04/18/2017 0950   ALBUMIN 3.5 04/17/2018 0916   ALBUMIN 3.4 (L) 04/18/2017 0950   AST 22 04/17/2018 0916   AST 28 04/18/2017 0950   ALT 16 04/17/2018 0916   ALT 18 04/18/2017 0950   ALKPHOS 43 04/17/2018 0916   ALKPHOS 42 04/18/2017 0950   BILITOT 0.5 04/17/2018 0916   BILITOT 0.55 04/18/2017 0950   GFRNONAA 14 (L) 04/19/2018 0559   GFRAA 16 (L) 04/19/2018 0559    INo results found for: SPEP, UPEP  Lab Results  Component Value Date   WBC 6.8 04/27/2018   NEUTROABS 5.2 04/27/2018   HGB 11.5 (L) 04/27/2018   HCT 36.0 04/27/2018   MCV 81.4 04/27/2018   PLT 226.0 04/27/2018      Chemistry      Component Value Date/Time   NA 140 04/19/2018 0559   NA 134 (L) 04/18/2017 0950   K 3.2 (L) 04/19/2018 0559   K 3.4 (L) 04/18/2017 0950   CL 104 04/19/2018 0559   CO2 26 04/19/2018 0559   CO2 26 04/18/2017 0950   BUN 53 (H) 04/19/2018 0559   BUN 53.9 (H) 04/18/2017 0950   CREATININE 2.97 (H) 04/19/2018 0559   CREATININE 2.4 (H) 04/18/2017 0950   GLU 103 10/05/2016      Component Value Date/Time   CALCIUM 9.3 04/19/2018 0559   CALCIUM 9.1 04/18/2017 0950   ALKPHOS 43 04/17/2018 0916   ALKPHOS 42 04/18/2017 0950   AST 22 04/17/2018 0916   AST 28 04/18/2017 0950   ALT 16 04/17/2018 0916   ALT 18 04/18/2017 0950   BILITOT 0.5 04/17/2018 0916   BILITOT 0.55 04/18/2017 0950       No results found for: LABCA2  No components found for: JASNK539  No results for input(s): INR in the last 168 hours.  Urinalysis    Component Value Date/Time   COLORURINE LT. YELLOW 07/01/2011 [place] 07/01/2011 1203   LABSPEC 1.010 07/01/2011 1203   PHURINE 6.0 07/01/2011 1203   GLUCOSEU NEGATIVE 07/01/2011 1203   HGBUR TRACE-LYSED 07/01/2011 1203   BILIRUBINUR neg 05/02/2013 1215   KETONESUR NEGATIVE 07/01/2011 1203   PROTEINUR 1 05/02/2013 1215   PROTEINUR NEGATIVE  09/15/2010 0449   UROBILINOGEN 0.2 05/02/2013 1215   UROBILINOGEN 0.2 07/01/2011 1203   NITRITE neg 05/02/2013 1215   NITRITE NEGATIVE 07/01/2011 1203   LEUKOCYTESUR moderate (2+) 05/02/2013 1215     STUDIES: Since her last visit, she underwent diagnostic bilateral mammography with CAD and tomography on 10/30/2017 at [facility] showing: breast density category B. There was no evidence of malignancy.   ELIGIBLE FOR AVAILABLE RESEARCH PROTOCOL: COMET/ discussed 08/23/2016; patient declines participation  ASSESSMENT: 77 y.o. [place] woman status post left breast upper inner quadrant biopsy 07/29/2016 for ductal carcinoma in situ, grade 2, estrogen and progesterone receptor positive  (1) Genetics testing 08/23/2016 through the Multi-Gene Panel offered by [facility] found no deleterious mutations in ALK, APC, ATM, AXIN2,BAP1,  BARD1, BLM, BMPR1A, BRCA1, BRCA2, BRIP1, CASR, CDC73, CDH1, CDK4, CDKN1B, CDKN1C, CDKN2A (p14ARF), CDKN2A (p16INK4a), CEBPA, CHEK2, DICER1, CIS3L2, EGFR (c.2369C>T, p.Thr790Met variant only), EPCAM (Deletion/duplication testing only), FH, FLCN, GATA2, GPC3, GREM1 (Promoter region deletion/duplication testing only), HOXB13 (c.251G>A, p.Gly84Glu), HRAS, KIT, MAX, MEN1, MET, MITF (c.952G>A, p.Glu318Lys variant only), MLH1, MSH2, MSH6, MUTYH, NBN, NF1, NF2, PALB2, PDGFRA, PHOX2B, PMS2, POLD1, POLE, POT1, PRKAR1A, PTCH1, PTEN, RAD50, RAD51C, RAD51D, RB1, RECQL4, RET, RUNX1, SDHAF2, SDHA (sequence changes only), SDHB, SDHC, SDHD,  SMAD4, SMARCA4, SMARCB1, SMARCE1, STK11, SUFU, TERT, TERT, TMEM127, TP53, TSC1, TSC2, VHL, WRN and WT1  (2) considered the COMET trial: patient declines  (3) left lumpectomy 09/19/2016 found ductal carcinoma in situ, grade 2, measuring 0.6 cm, with negative margins.  (4) advised against adjuvant radiation since there would be no survival advantage in this setting and the patient's pacemaker would have to be moved  (5) anastrozole started  09/27/2016  (a) bone density 08/25/2015 found a T score of -2.7, consistent with osteoporosis   (b) denosumab/Prolia started 10/25/2016, repeated every 24 weeks  PLAN: Yolanda Huffman is doing moderately well today.  She tolerated the feraheme well last week and I reviewed that she will receive it again today.  Her hemoglobin is much improved and I am delighted she is feeling much better.  I reviewed her recent issues with Dr. Jana Hakim who also came and met with Yolanda Huffman and reviewed her iron deficiency anemia.  She will undergo GI work up with Dr. Silverio Decamp and I have referred her for that today.    She will see Dr. Jana Hakim in 06/2018 for labs and f/u.  She knows to call for any other issues that may develop before the next visit.    Wilber Bihari, NP  04/30/18 8:41 AM Medical Oncology and Hematology [facility] [address] [place], [place] [address] Tel. [phone]    Fax. [phone]   ADDENDUM: I met with Yolanda Huffman partly for reassurance and partly so she would understand the pathophysiology of iron deficiency, although we have discussed that in the past as well.  It is important that she understands why she needs to undergo GI work-up.  Certainly the infusions now should be adequate for the next 2 to 3 months.  I will see her again in January and thereafter we will be checking labs every 3 months indefinitely.  I personally saw this patient and performed a substantive portion of this encounter with the listed APP documented above.   Chauncey Cruel, MD Medical Oncology and Hematology [facility] [address] [place], [place] [address] Tel. [phone]    Fax. [phone]

## 2018-05-03 ENCOUNTER — Other Ambulatory Visit: Payer: Self-pay | Admitting: Family Medicine

## 2018-05-04 ENCOUNTER — Encounter: Payer: Self-pay | Admitting: Oncology

## 2018-05-04 ENCOUNTER — Telehealth: Payer: Self-pay | Admitting: *Deleted

## 2018-05-04 NOTE — Telephone Encounter (Signed)
These were ordered by hematology.  Her ferritin (iron storage) was very low, so she should proceed with plans unless otherwise directed by hematology

## 2018-05-04 NOTE — Telephone Encounter (Signed)
Please advise if okay to have done. Thanks!

## 2018-05-04 NOTE — Telephone Encounter (Signed)
Copied from [place] [phone]. Topic: General - Other >> May 04, 2018 11:37 AM Carolyn Stare wrote:  Pt call to say she has a infusion this week and said [facility] called her and told her she was scheduled for one 05/09/18 and is asking if this is correct    [phone]

## 2018-05-04 NOTE — Telephone Encounter (Signed)
Last infusion was 04/30/18 at [facility] infusion scheduled for 05/09/18 -- pt wants to know if she still needs to go Wednesday 11/13 to have another infusion done. Most recent labs were normal.   Notes recorded by Eulas Post, MD on 04/27/2018 at 9:21 PM EDT hgb improving.

## 2018-05-05 ENCOUNTER — Encounter: Payer: Self-pay | Admitting: Family Medicine

## 2018-05-07 ENCOUNTER — Ambulatory Visit: Payer: Medicare Other

## 2018-05-07 ENCOUNTER — Telehealth: Payer: Self-pay | Admitting: *Deleted

## 2018-05-07 ENCOUNTER — Other Ambulatory Visit: Payer: Self-pay | Admitting: Oncology

## 2018-05-07 ENCOUNTER — Other Ambulatory Visit: Payer: Self-pay | Admitting: *Deleted

## 2018-05-07 NOTE — Telephone Encounter (Signed)
Called patient and she stated that Yolanda Huffman states that Dr. Elease Hashimoto put in the order for the iron infusion and I do not see that in our system. Patient gave me the number that was left on her answering machine of [phone]. I will call in the morning to see what I can find out for the patient.

## 2018-05-07 NOTE — Telephone Encounter (Signed)
See my chart message

## 2018-05-07 NOTE — Telephone Encounter (Signed)
See telephone encounter 05/04/18.

## 2018-05-07 NOTE — Telephone Encounter (Signed)
Called patient and gave her the message from Dr. Elease Hashimoto. Patient verbalized an understanding.

## 2018-05-07 NOTE — Telephone Encounter (Signed)
This RN received call from pt - stating she is scheduled for an iron infusion again this Wednesday the 13th - at [facility] ordered by Dr Elease Hashimoto.  Jahira wanted to verify she is to have a 3rd iron infusion within the month.  Of note - per admission to the hospital 10/23 Letta Median received 2 UPRBC's and 500 mg of iron dextran ( verified by our pharmacy )   She then saw Dr Jana Hakim - on 10/28 and was set up for 2nd iron infusion on 11/4 - feraheme 510mg  which she received.  Pt has received a total of 1gram of intravenous iron since 10/23.  Per Dr Jana Hakim pt does not need any further iron at this time- with appointment for lab and follow up in 2 months for possible further need for iron infusion.  This RN contacted Dr Erick Blinks to verify and communicate above information - was requested to leave message for call to be returned.  This RN left her name and direct desk number for return call.  This note will be sent to Dr Elease Hashimoto and his assistant for additional communication.

## 2018-05-07 NOTE — Telephone Encounter (Signed)
Pt called, she is wanting an answer as soon as you can call.

## 2018-05-07 NOTE — Telephone Encounter (Signed)
Please see message.  Please advise. 

## 2018-05-07 NOTE — Telephone Encounter (Signed)
Yolanda Huffman 05/07/2018 09:35 AM  Patient said she just spoke with Dr Erick Blinks nurse and has additional questions for her. Patient would like her to call her back. Please reach her at [phone]

## 2018-05-07 NOTE — Telephone Encounter (Signed)
I am not sure why this has been so confusing.  We did not order any iron infusions.  They are done through hematology.  She should follow Dr Virgie Dad advice.  If he is advising no further iron infusion- then she should not get any additional infusions.

## 2018-05-08 ENCOUNTER — Telehealth: Payer: Self-pay

## 2018-05-08 NOTE — Telephone Encounter (Signed)
[name] and spoke to [name] and she stated that the iron influsion was ordered by Dr. Marval Regal from [facility]. Iron infusion for tomorrow has been canceled since patient has recently had 2 iron infusions. Yolanda Huffman stated she would reach out to [name] from [facility].  Called patient and let her know that her appointment for the iron infusion for tomorrow has been cancelled. Patient verbalized an understanding and is aware to reach out to Dr. Marval Regal for further instructions.

## 2018-05-08 NOTE — Telephone Encounter (Signed)
Thank you- I am unsure who ordered the outpt iron infusion at [facility] - but noticed today it was cancelled. Yolanda Huffman is scheduled for follow up with Dr Jana Hakim for her iron def anemia in January.   I will inform Yolanda Jodine Muchmore RN

## 2018-05-08 NOTE — Telephone Encounter (Signed)
Dr. Oletta Darter ordered an Iron infusion and patient has been made aware. Dr. Erick Blinks name was accidentally entered for this order instead of Dr. Marval Regal who actually ordered this and now Dr. Jana Hakim is taking care of this from now on.  Copied from [place] [phone]. Topic: General - Other >> May 07, 2018  1:02 PM Yvette Rack wrote: Reason for CRM: Nurse Mateo Flow from Dr Jana Hakim Office [phone] calling wanting to speak with Dr Birdie Hopes about pt blood work IRON they already done the test and didn't want DR Burchette to repeat order pt has an appt on Wednesday at Dr Jana Hakim office

## 2018-05-08 NOTE — Telephone Encounter (Signed)
Thanks.  Sorry for the confusion.  Apparently, my name somehow got attached to the order and this had been ordered by her nephrologist.

## 2018-05-09 ENCOUNTER — Ambulatory Visit (HOSPITAL_COMMUNITY): Payer: Medicare Other

## 2018-05-11 ENCOUNTER — Telehealth: Payer: Self-pay | Admitting: Oncology

## 2018-05-11 ENCOUNTER — Telehealth: Payer: Self-pay

## 2018-05-11 NOTE — Telephone Encounter (Signed)
Returned patient's call.  Patient requesting clarification on GI appointment/referral.  Nurse explained reason GI referral was placed, and patient voiced understanding.  Also, patient requesting upcoming appointment with Dr. Jana Hakim to be rescheduled to a different day.  Scheduling message sent.  No further needs at this time.

## 2018-05-11 NOTE — Telephone Encounter (Signed)
Called patient per 11/15 sch message to r/s Jan 2020 appt - no answer and no vmail.

## 2018-05-14 ENCOUNTER — Ambulatory Visit: Payer: Medicare Other | Admitting: General Practice

## 2018-05-14 DIAGNOSIS — Z7901 Long term (current) use of anticoagulants: Secondary | ICD-10-CM

## 2018-05-14 LAB — POCT INR: INR: 1.8 — AB (ref 2.0–3.0)

## 2018-05-14 NOTE — Patient Instructions (Addendum)
Pre visit review using our clinic review tool, if applicable. No additional management support is needed unless otherwise documented below in the visit note.  Take 1 tablet today and then continue to take 1/2 tablet on Monday, Wednesdays and Fridays and Saturdays and 1 tablet all other days.  Re-check in 4 weeks.

## 2018-05-15 NOTE — Telephone Encounter (Signed)
Copied from [place] [id number]. Topic: General - Other >> May 15, 2018  9:22 AM Janace Aris A wrote: Reason for CRM: Pt called in, she wanted to know if she could get her INR check at her 6 mos f/u appt with her provider. She says she is only able to get a ride to one appt, she will not be able to make it to both.  Updated appt notes. *

## 2018-05-18 ENCOUNTER — Ambulatory Visit (INDEPENDENT_AMBULATORY_CARE_PROVIDER_SITE_OTHER): Payer: Medicare Other

## 2018-05-18 DIAGNOSIS — I495 Sick sinus syndrome: Secondary | ICD-10-CM | POA: Diagnosis not present

## 2018-05-18 NOTE — Progress Notes (Signed)
Remote pacemaker transmission.   

## 2018-05-21 ENCOUNTER — Telehealth: Payer: Self-pay | Admitting: Oncology

## 2018-05-21 ENCOUNTER — Encounter: Payer: Self-pay | Admitting: Cardiology

## 2018-05-21 NOTE — Telephone Encounter (Signed)
Patient called to reschedule  °

## 2018-06-04 ENCOUNTER — Ambulatory Visit: Payer: Medicare Other

## 2018-06-05 ENCOUNTER — Other Ambulatory Visit: Payer: Self-pay | Admitting: Family Medicine

## 2018-06-05 DIAGNOSIS — I5042 Chronic combined systolic (congestive) and diastolic (congestive) heart failure: Secondary | ICD-10-CM

## 2018-06-06 ENCOUNTER — Telehealth: Payer: Self-pay | Admitting: Oncology

## 2018-06-06 NOTE — Telephone Encounter (Signed)
Patient called to reschedule  °

## 2018-06-13 ENCOUNTER — Encounter: Payer: Self-pay | Admitting: Gastroenterology

## 2018-06-13 ENCOUNTER — Ambulatory Visit: Payer: Medicare Other | Admitting: Gastroenterology

## 2018-06-13 ENCOUNTER — Telehealth: Payer: Self-pay | Admitting: *Deleted

## 2018-06-13 VITALS — BP 110/70 | HR 80 | Ht 63.0 in | Wt 148.6 lb

## 2018-06-13 DIAGNOSIS — D508 Other iron deficiency anemias: Secondary | ICD-10-CM

## 2018-06-13 NOTE — Progress Notes (Signed)
Yolanda Huffman    [id number]    09-10-1940  Primary Care Physician:Burchette, Alinda Sierras, MD  Referring Physician: Chauncey Cruel, MD [address] [place], [place] [address]  Chief complaint: Iron deficiency anemia  HPI:  36 yr F with history of sinus node dysfunction status post pacemaker chronic anticoagulation with Coumadin, left breast ductal carcinoma in situ here for evaluation of iron deficiency anemia  Denies any blood in stool, dark stool or blood per rectum.  No nausea, vomiting, dysphagia, abdominal pain or change in bowel habits.  No loss of appetite or weight loss.  Hemoglobin dropped to 7.1 in October 2019 status post blood transfusion with 2 units packed RBC and IV iron infusion.  Fecal Hemoccult negative.  B12 585, folate 47, iron saturation 4%, iron level 18, TIBC 412 and ferritin 5 Hemoglobin 11.5, hematocrit 36, MCV 81 on April 27, 2018.    Colonoscopy November 2011 by Dr. Deatra Ina with removal of 20 mm polyp from descending colon, hyper plastic.  Severe left-sided diverticulosis  Colonoscopy June 25, 2015 poor prep  Colonoscopy July 30, 2015 showed severe diverticulosis otherwise unremarkable exam  Outpatient Encounter Medications as of 06/13/2018  Medication Sig  . anastrozole (ARIMIDEX) 1 MG tablet TAKE 1 TABLET BY MOUTH DAILY  . atorvastatin (LIPITOR) 40 MG tablet TAKE ONE TABLET BY MOUTH DAILY  . calcitRIOL (ROCALTROL) 0.25 MCG capsule Take 0.25 mcg by mouth daily. In the morning.  . calcium carbonate (TUMS - DOSED IN MG ELEMENTAL CALCIUM) 500 MG chewable tablet Chew 1 tablet by mouth daily.  . colchicine 0.6 MG tablet TAKE 1 TABLET (0.6 MG TOTAL) BY MOUTH AS NEEDED AS DIRECTED. (Patient taking differently: Take 0.6 mg by mouth daily as needed (gout flare). )  . Cranberry 500 MG CAPS Take 500 mg by mouth daily with lunch.  . denosumab (PROLIA) 60 MG/ML SOSY injection Inject 60 mg into the skin every 6 (six) months.  . feeding  supplement, GLUCERNA SHAKE, (GLUCERNA SHAKE) LIQD Take 237 mLs by mouth 2 (two) times daily between meals.  . furosemide (LASIX) 40 MG tablet TAKE TWO TABLETS BY MOUTH EVERY MORNING AND TAKE ONE TABLET BY MOUTH EVERY EVENING (Patient taking differently: Take 20-40 mg by mouth. 40 mg in the am and 20 mg at lunch time)  . glucose blood test strip 1 each by Other route 3 (three) times daily. Use as instructed   . Insulin Detemir (LEVEMIR FLEXTOUCH) 100 UNIT/ML Pen Inject 12 Units into the skin daily.  . metolazone (ZAROXOLYN) 2.5 MG tablet TAKE 1 TABLET BY MOUTH EVERY MONDAY, WEDNESDAY, AND FRIDAY IN THE MORNING  . metoprolol succinate (TOPROL-XL) 25 MG 24 hr tablet TAKE TWO TABLET BY MOUTH EVERY MORNING AND THEN ONE TABLET BY MOUTH EVERY EVENING  . Multiple Vitamin (MULTIVITAMIN WITH MINERALS) TABS tablet Take 1 tablet by mouth daily. FOR ADULT 50+  . Omega-3 Fatty Acids (FISH OIL) 1000 MG CAPS Take 1,000 mg by mouth 2 (two) times daily.  . pantoprazole (PROTONIX) 40 MG tablet TAKE ONE TABLET BY MOUTH DAILY  . potassium chloride SA (K-DUR,KLOR-CON) 20 MEQ tablet TAKE 2 TABLET BY MOUTH TWO TIMES A DAY WITH FOOD AND WATER. (Patient taking differently: Take 40 mEq by mouth. TAKE 2 TABLET BY MOUTH TWO TIMES A DAY WITH FOOD AND WATER.)  . warfarin (COUMADIN) 5 MG tablet Take 1/2 tablet daily except 1 on sun Tues Thurs or TAKE AS DIRECTED BY ANTICOAGULATION CLINIC (  Patient taking differently: 2.5 mg on Mon, Wed, Fri and Sat 5 mg on Tues Thurs Sun)   No facility-administered encounter medications on file as of 06/13/2018.     Allergies as of 06/13/2018 - Review Complete 06/13/2018  Allergen Reaction Noted  . Amlodipine besylate Swelling 10/10/2014    Past Medical History:  Diagnosis Date  . Anemia   . Anemia   . Arthritis    gout  . Atrial fibrillation ([place])    prior Sotalol - d/c'd 2/2 increased creatinine; rate control strategy  . CAD (coronary artery disease)    s/p PCI in [place] in 1/09  .  Cancer [facility])    left breast  . Chronic combined systolic and diastolic heart failure (HCC)    Echocardiogram 3/12: Mild LVH, EF 15-17%, normal diastolic function, mild AI, mild MR, PASP 44, normal wall motion  . CKD (chronic kidney disease)    sees Dr. Burman Foster - Stage 3 ?  Marland Kitchen Diabetes mellitus   . Hyperlipemia   . Hypertension   . MI, old    2009  . Osteoporosis   . Pacemaker   . Pleural effusion   . Pneumonia   . PUD (peptic ulcer disease) 10/2010   duodenal ulcer    Past Surgical History:  Procedure Laterality Date  . BREAST BIOPSY Left 07/29/2016  . BREAST LUMPECTOMY Left 09/19/2016  . BREAST LUMPECTOMY WITH RADIOACTIVE SEED LOCALIZATION Left 09/19/2016   Procedure: LEFT BREAST LUMPECTOMY WITH RADIOACTIVE SEED LOCALIZATION;  Surgeon: Autumn Messing III, MD;  Location: [facility];  Service: General;  Laterality: Left;  . CATARACT EXTRACTION Bilateral    01/12/09 and 01/26/09 both eyes  . COLONOSCOPY    . CORONARY ANGIOPLASTY  2009  . PACEMAKER INSERTION  07/13/07  . stent implant  07/13/2007  . TOTAL HIP ARTHROPLASTY Right 07/23/2017   Procedure: TOTAL HIP ARTHROPLASTY ANTERIOR APPROACH;  Surgeon: Mcarthur Rossetti, MD;  Location: [place];  Service: Orthopedics;  Laterality: Right;    Family History  Problem Relation Age of Onset  . Heart disease Father   . Heart attack Father   . Clotting disorder Father        blood clot  . Hypertension Father   . Heart disease Mother   . Breast cancer Maternal Aunt 40  . Breast cancer Sister 76  . Breast cancer Other 40  . Colon cancer Neg Hx     Social History   Socioeconomic History  . Marital status: Widowed    Spouse name: Not on file  . Number of children: 1  . Years of education: Not on file  . Highest education level: Not on file  Occupational History  . Occupation: retired Theatre stage manager: RETIRED  Social Needs  . Financial resource strain: Not on file  . Food insecurity:    Worry: Not on file    Inability: Not on  file  . Transportation needs:    Medical: Not on file    Non-medical: Not on file  Tobacco Use  . Smoking status: Former Smoker    Packs/day: 2.00    Years: 20.00    Pack years: 40.00    Types: Cigarettes    Last attempt to quit: 11/26/1991    Years since quitting: 26.5  . Smokeless tobacco: Never Used  Substance and Sexual Activity  . Alcohol use: No  . Drug use: No  . Sexual activity: Not on file  Lifestyle  . Physical activity:  Days per week: Not on file    Minutes per session: Not on file  . Stress: Not on file  Relationships  . Social connections:    Talks on phone: Not on file    Gets together: Not on file    Attends religious service: Not on file    Active member of club or organization: Not on file    Attends meetings of clubs or organizations: Not on file    Relationship status: Not on file  . Intimate partner violence:    Fear of current or ex partner: Not on file    Emotionally abused: Not on file    Physically abused: Not on file    Forced sexual activity: Not on file  Other Topics Concern  . Not on file  Social History Narrative   Reg exercise            Review of systems: Review of Systems  Constitutional: Negative for fever and chills.  HENT: Negative.   Eyes: Negative for blurred vision.  Respiratory: Negative for cough, shortness of breath and wheezing.   Cardiovascular: Negative for chest pain and palpitations.  Gastrointestinal: as per HPI Genitourinary: Negative for dysuria, urgency, frequency and hematuria.  Musculoskeletal: Negative for myalgias, back pain and joint pain.  Skin: Negative for itching and rash.  Neurological: Negative for dizziness, tremors, focal weakness, seizures and loss of consciousness.  Endo/Heme/Allergies: Positive for seasonal allergies.  Psychiatric/Behavioral: Negative for depression, suicidal ideas and hallucinations.  All other systems reviewed and are negative.   Physical Exam: Vitals:   06/13/18 0931    BP: 110/70  Pulse: 80   Body mass index is 26.32 kg/m. Gen:      No acute distress HEENT:  EOMI, sclera anicteric Neck:     No masses; no thyromegaly Lungs:    Clear to auscultation bilaterally; normal respiratory effort CV:         Regular rate and rhythm; no murmurs Abd:      + bowel sounds; soft, non-tender; no palpable masses, no distension Ext:    No edema; adequate peripheral perfusion Skin:      Warm and dry; no rash Neuro: alert and oriented x 3 Psych: normal mood and affect  Data Reviewed:  Reviewed labs, radiology imaging, old records and pertinent past GI work up   Assessment and Plan/Recommendations:  77 year old female with history of diabetes, hypertension, CAD, A. fib, sick sinus syndrome status post pacemaker on chronic anticoagulation here for evaluation of iron deficiency anemia Hemoglobin and ferritin responded appropriately to packed RBC transfusion and IV iron infusion She is up-to-date with colorectal cancer screening We will schedule for EGD to exclude possible occult upper GI blood loss, if negative will proceed with more bowel video capsule study Will request cardiology if okay to hold Coumadin 5 days prior to procedure  The risks and benefits as well as alternatives of endoscopic procedure(s) have been discussed and reviewed. All questions answered. The patient agrees to proceed.   Damaris Hippo , MD [phone]    CC: Magrinat, Virgie Dad, MD

## 2018-06-13 NOTE — Patient Instructions (Signed)
You have been scheduled for an endoscopy. Please follow written instructions given to you at your visit today. If you use inhalers (even only as needed), please bring them with you on the day of your procedure.   You will be contaced by our office prior to your procedure for directions on holding your Coumadin/Warfarin.  If you do not hear from our office 1 week prior to your scheduled procedure, please call [phone] to discuss.  If you are age 77 or older, your body mass index should be between 23-30. Your Body mass index is 26.32 kg/m. If this is out of the aforementioned range listed, please consider follow up with your Primary Care Provider.  If you are age 49 or younger, your body mass index should be between 19-25. Your Body mass index is 26.32 kg/m. If this is out of the aformentioned range listed, please consider follow up with your Primary Care Provider.    Thank you for choosing [facility]  Karleen Hampshire Nandigam,MD

## 2018-06-13 NOTE — Telephone Encounter (Signed)
[facility] Pre-operative Risk Assessment     Request for surgical clearance:     Endoscopy Procedure  What type of surgery is being performed?     Endoscopy  When is this surgery scheduled?     07/03/2018  What type of clearance is required ?   Pharmacy  Are there any medications that need to be held prior to surgery and how long? Coumadin  Practice name and name of physician performing surgery?      [facility]  What is your office phone and fax number?      Phone- [phone]  [phone]  Anesthesia type (None, local, MAC, general) ?       MAC

## 2018-06-14 ENCOUNTER — Encounter: Payer: Self-pay | Admitting: Gastroenterology

## 2018-06-15 ENCOUNTER — Other Ambulatory Visit: Payer: Self-pay

## 2018-06-15 ENCOUNTER — Ambulatory Visit: Payer: Medicare Other | Admitting: Family Medicine

## 2018-06-15 ENCOUNTER — Encounter: Payer: Self-pay | Admitting: Family Medicine

## 2018-06-15 ENCOUNTER — Ambulatory Visit: Payer: Self-pay | Admitting: General Practice

## 2018-06-15 VITALS — BP 134/72 | HR 65 | Temp 97.5°F | Ht 63.0 in | Wt 147.1 lb

## 2018-06-15 DIAGNOSIS — E1122 Type 2 diabetes mellitus with diabetic chronic kidney disease: Secondary | ICD-10-CM | POA: Diagnosis not present

## 2018-06-15 DIAGNOSIS — I5042 Chronic combined systolic (congestive) and diastolic (congestive) heart failure: Secondary | ICD-10-CM | POA: Diagnosis not present

## 2018-06-15 DIAGNOSIS — I4821 Permanent atrial fibrillation: Secondary | ICD-10-CM

## 2018-06-15 DIAGNOSIS — I10 Essential (primary) hypertension: Secondary | ICD-10-CM | POA: Diagnosis not present

## 2018-06-15 DIAGNOSIS — N184 Chronic kidney disease, stage 4 (severe): Secondary | ICD-10-CM | POA: Diagnosis not present

## 2018-06-15 DIAGNOSIS — I129 Hypertensive chronic kidney disease with stage 1 through stage 4 chronic kidney disease, or unspecified chronic kidney disease: Secondary | ICD-10-CM | POA: Diagnosis not present

## 2018-06-15 DIAGNOSIS — M818 Other osteoporosis without current pathological fracture: Secondary | ICD-10-CM

## 2018-06-15 DIAGNOSIS — Z7901 Long term (current) use of anticoagulants: Secondary | ICD-10-CM

## 2018-06-15 LAB — POCT GLYCOSYLATED HEMOGLOBIN (HGB A1C): HEMOGLOBIN A1C: 5.9 % — AB (ref 4.0–5.6)

## 2018-06-15 LAB — POCT INR: INR: 2.3 (ref 2.0–3.0)

## 2018-06-15 NOTE — Patient Instructions (Signed)
Pre visit review using our clinic review tool, if applicable. No additional management support is needed unless otherwise documented below in the visit note.  Continue to take 1/2 tablet on Monday, Wednesdays and Fridays and Saturdays and 1 tablet all other days.  Re-check in 4 weeks.

## 2018-06-15 NOTE — Telephone Encounter (Signed)
   Coumadin is being managed by her PCP. Please contact PCP office for recs.   I will route this recommendation to the requesting party via Epic fax function and remove from pre-op pool.    Lyda Jester, PA-C 06/15/2018, 11:46 AM

## 2018-06-15 NOTE — Patient Instructions (Signed)
Set up DEXA scan for [facility] sometime in January.

## 2018-06-15 NOTE — Progress Notes (Signed)
Subjective:     Patient ID: Yolanda Huffman, female   DOB: 10-22-1940, 77 y.o.   MRN: [id number]  HPI Patient is here for medical follow-up.  She has history of breast cancer, chronic kidney disease, atrial fibrillation, type 2 diabetes, hypertension, chronic combined systolic and diastolic heart failure, osteoporosis.  She is getting Prolia injections every 6 months.  Last DEXA scan was March 2017. She is followed by multiple specialists including cardiology, GI, heme-onc, and nephrology.  Recent anemia.  Improved following iron infusion.  Most recent hemoglobin 11.5.  Denies any polyuria or polydipsia.  Blood sugars been fairly well controlled.  She remains on Coumadin for atrial fib.  She is followed through the Coumadin clinic.  Needs follow-up INR now.  Denies any recent falls.  Mood stable.  No chest pains.  No dyspnea.  No peripheral edema.  Past Medical History:  Diagnosis Date  . Anemia   . Anemia   . Arthritis    gout  . Atrial fibrillation ([place])    prior Sotalol - d/c'd 2/2 increased creatinine; rate control strategy  . CAD (coronary artery disease)    s/p PCI in [place] in 1/09  . Cancer [facility])    left breast  . Chronic combined systolic and diastolic heart failure (HCC)    Echocardiogram 3/12: Mild LVH, EF 82-99%, normal diastolic function, mild AI, mild MR, PASP 44, normal wall motion  . CKD (chronic kidney disease)    sees Dr. Burman Foster - Stage 3 ?  Marland Kitchen Diabetes mellitus   . Hyperlipemia   . Hypertension   . MI, old    2009  . Osteoporosis   . Pacemaker   . Pleural effusion   . Pneumonia   . PUD (peptic ulcer disease) 10/2010   duodenal ulcer   Past Surgical History:  Procedure Laterality Date  . BREAST BIOPSY Left 07/29/2016  . BREAST LUMPECTOMY Left 09/19/2016  . BREAST LUMPECTOMY WITH RADIOACTIVE SEED LOCALIZATION Left 09/19/2016   Procedure: LEFT BREAST LUMPECTOMY WITH RADIOACTIVE SEED LOCALIZATION;  Surgeon: Autumn Messing III, MD;  Location: [place];  Service:  General;  Laterality: Left;  . CATARACT EXTRACTION Bilateral    01/12/09 and 01/26/09 both eyes  . COLONOSCOPY    . CORONARY ANGIOPLASTY  2009  . PACEMAKER INSERTION  07/13/07  . stent implant  07/13/2007  . TOTAL HIP ARTHROPLASTY Right 07/23/2017   Procedure: TOTAL HIP ARTHROPLASTY ANTERIOR APPROACH;  Surgeon: Mcarthur Rossetti, MD;  Location: [facility];  Service: Orthopedics;  Laterality: Right;    reports that she quit smoking about 26 years ago. Her smoking use included cigarettes. She has a 40.00 pack-year smoking history. She has never used smokeless tobacco. She reports that she does not drink alcohol or use drugs. family history includes Breast cancer (age of onset: 54) in an other family member; Breast cancer (age of onset: 49) in her maternal aunt; Breast cancer (age of onset: 22) in her sister; Clotting disorder in her father; Heart attack in her father; Heart disease in her father and mother; Hypertension in her father. Allergies  Allergen Reactions  . Amlodipine Besylate Swelling    edema     Review of Systems  Constitutional: Negative for fatigue and unexpected weight change.  Eyes: Negative for visual disturbance.  Respiratory: Negative for cough, chest tightness, shortness of breath and wheezing.   Cardiovascular: Negative for chest pain, palpitations and leg swelling.  Genitourinary: Negative for dysuria.  Neurological: Negative for dizziness, seizures, syncope, weakness, light-headedness and  headaches.       Objective:   Physical Exam Constitutional:      Appearance: She is well-developed.  Eyes:     Pupils: Pupils are equal, round, and reactive to light.  Neck:     Musculoskeletal: Neck supple.     Thyroid: No thyromegaly.     Vascular: No JVD.  Cardiovascular:     Rate and Rhythm: Normal rate.     Heart sounds: No gallop.   Pulmonary:     Effort: Pulmonary effort is normal. No respiratory distress.     Breath sounds: Normal breath sounds. No wheezing or  rales.  Musculoskeletal:     Right lower leg: No edema.     Left lower leg: No edema.  Neurological:     Mental Status: She is alert.        Assessment:     #1 type 2 diabetes controlled on low-dose Lantus 12 units once daily with INR today 5.9%  #2 chronic atrial fibrillation on Coumadin.  Needs follow-up INR  #3 osteoporosis.  She is on antiestrogen therapy with Arimedex and is getting Prolia injections  #4 anemia.  Probably multifactorial.  She has had some iron deficiency and is followed by GI and heme-onc.  Hemoglobin improved following iron infusion.  #5 chronic kidney disease followed by nephrology    Plan:     -Repeat DEXA scan ordered -We will check INR today in lab -Continue every 74-month Prolia injections-she is getting these through hematology/oncology -Continue current dose of Lantus -Routine follow-up in 6 months and sooner as needed  Eulas Post MD [facility]

## 2018-06-18 ENCOUNTER — Ambulatory Visit: Payer: Medicare Other

## 2018-06-18 ENCOUNTER — Ambulatory Visit (INDEPENDENT_AMBULATORY_CARE_PROVIDER_SITE_OTHER): Payer: Medicare Other

## 2018-06-18 DIAGNOSIS — I495 Sick sinus syndrome: Secondary | ICD-10-CM

## 2018-06-18 DIAGNOSIS — I10 Essential (primary) hypertension: Secondary | ICD-10-CM

## 2018-06-18 NOTE — Telephone Encounter (Signed)
Yolanda Huffman,  Would you be able to assist with bridging her with Lovenox?

## 2018-06-18 NOTE — Telephone Encounter (Signed)
Dr Elease Hashimoto  Please advise on coumadin clearance

## 2018-06-18 NOTE — Progress Notes (Signed)
Remote pacemaker transmission.   

## 2018-06-19 LAB — CUP PACEART REMOTE DEVICE CHECK
Battery Impedance: 5905 Ohm
Brady Statistic RV Percent Paced: 100 %
Date Time Interrogation Session: 20191223115159
Implantable Lead Implant Date: 20090116
Implantable Lead Model: 5076
Lead Channel Impedance Value: 67 Ohm
Lead Channel Pacing Threshold Amplitude: 1.5 V
Lead Channel Setting Pacing Amplitude: 3 V
Lead Channel Setting Pacing Pulse Width: 0.4 ms
Lead Channel Setting Sensing Sensitivity: 2 mV
MDC IDC LEAD IMPLANT DT: 20090116
MDC IDC LEAD LOCATION: 753859
MDC IDC LEAD LOCATION: 753860
MDC IDC MSMT BATTERY REMAINING LONGEVITY: 2 mo
MDC IDC MSMT BATTERY VOLTAGE: 2.65 V
MDC IDC MSMT LEADCHNL RV IMPEDANCE VALUE: 461 Ohm
MDC IDC MSMT LEADCHNL RV PACING THRESHOLD PULSEWIDTH: 0.4 ms
MDC IDC PG IMPLANT DT: 20090116

## 2018-06-22 ENCOUNTER — Other Ambulatory Visit: Payer: Self-pay | Admitting: General Practice

## 2018-06-22 ENCOUNTER — Telehealth: Payer: Self-pay | Admitting: Family Medicine

## 2018-06-22 ENCOUNTER — Telehealth: Payer: Self-pay | Admitting: General Practice

## 2018-06-22 DIAGNOSIS — Z7901 Long term (current) use of anticoagulants: Secondary | ICD-10-CM

## 2018-06-22 MED ORDER — ENOXAPARIN SODIUM 60 MG/0.6ML ~~LOC~~ SOLN: 60.0000 mg | SUBCUTANEOUS | 0 refills | Status: DC

## 2018-06-22 NOTE — Telephone Encounter (Signed)
Instructions for coumadin and Lovenox bridge pre and post procedure on 07/03/2018.  1/2 - Last dose of coumadin until after procedure 1/3 - Nothing (No coumadin and No Lovenox) 1/4 - Lovenox in the AM 1/5 - Lovenox in the AM 1/6 - Lovenox in the AM (Please take by 7 am) 1/7 - Procedure (NO LOVENOX TODAY) 1/8 - Lovenox in the AM and 1 1/2 tablets of coumadin 1/9 - Lovenox in the AM and 1 1/2 tablets of coumadin 1/10 - Lovenox in the AM and 1 1/2 tablets of coumadin 1/11 - Lovenox in the AM and 1 tablet of coumadin 1/12 - Stop Lovenox and resume taking coumadin, 1/2 tablet daily except 1 tablet on Sun, Tues and Thursday. 1/13 - Check INR at [place].  Dosing instructions given to patient and patient verbalized understanding.

## 2018-06-22 NOTE — Telephone Encounter (Signed)
Yes.  I will contact patient today, 12/27.

## 2018-06-22 NOTE — Telephone Encounter (Signed)
Copied from [place] [id number]. Topic: Quick Communication - See Telephone Encounter >> Jun 22, 2018  5:11 PM Rutherford Nail, [place] wrote: CRM for notification. See Telephone encounter for: 06/22/18. Estill Bamberg with [facility] calling and states that enoxaparin (LOVENOX) 60 MG/0.6ML injection prescription was sent over as quantity of 7. Would like to have clarification on duration of therapy? Is te quantity needed as only 7 individual syringes or is it supposed to be 47ml? CB#: [phone]

## 2018-06-25 ENCOUNTER — Encounter: Payer: Self-pay | Admitting: Gastroenterology

## 2018-06-25 NOTE — Telephone Encounter (Signed)
Please advise 

## 2018-06-25 NOTE — Telephone Encounter (Signed)
Called patient and she was informed about her coumadin and bridging by Dr Anastasio Auerbach office

## 2018-06-25 NOTE — Telephone Encounter (Signed)
Clarification given to pharmacist at [facility].

## 2018-07-03 ENCOUNTER — Encounter: Payer: Self-pay | Admitting: Gastroenterology

## 2018-07-03 ENCOUNTER — Ambulatory Visit (AMBULATORY_SURGERY_CENTER): Payer: Medicare Other | Admitting: Gastroenterology

## 2018-07-03 VITALS — BP 125/60 | HR 70 | Temp 96.8°F | Resp 20 | Ht 63.0 in | Wt 147.0 lb

## 2018-07-03 DIAGNOSIS — D509 Iron deficiency anemia, unspecified: Secondary | ICD-10-CM

## 2018-07-03 DIAGNOSIS — B3781 Candidal esophagitis: Secondary | ICD-10-CM

## 2018-07-03 DIAGNOSIS — D500 Iron deficiency anemia secondary to blood loss (chronic): Secondary | ICD-10-CM

## 2018-07-03 MED ORDER — SODIUM CHLORIDE 0.9 % IV SOLN: 500.0000 mL | Freq: Once | INTRAVENOUS | Status: DC

## 2018-07-03 MED ORDER — DEXTROSE 50 % IV SOLN
25.0000 mL | Freq: Once | INTRAVENOUS | Status: AC
  Administered 2018-07-03: 25 mL via INTRAVENOUS

## 2018-07-03 NOTE — Progress Notes (Signed)
Called to room to assist during endoscopic procedure.  Patient ID and intended procedure confirmed with present staff. Received instructions for my participation in the procedure from the performing physician.  

## 2018-07-03 NOTE — Patient Instructions (Signed)
   Await pathology results.  Resume Coumadin (warafin) tomorrow at prior dose.  YOU HAD AN ENDOSCOPIC PROCEDURE TODAY AT [facility]:   Refer to the procedure report that was given to you for any specific questions about what was found during the examination.  If the procedure report does not answer your questions, please call your gastroenterologist to clarify.  If you requested that your care partner not be given the details of your procedure findings, then the procedure report has been included in a sealed envelope for you to review at your convenience later.  YOU SHOULD EXPECT: Some feelings of bloating in the abdomen. Passage of more gas than usual.  Walking can help get rid of the air that was put into your GI tract during the procedure and reduce the bloating. If you had a lower endoscopy (such as a colonoscopy or flexible sigmoidoscopy) you may notice spotting of blood in your stool or on the toilet paper. If you underwent a bowel prep for your procedure, you may not have a normal bowel movement for a few days.  Please Note:  You might notice some irritation and congestion in your nose or some drainage.  This is from the oxygen used during your procedure.  There is no need for concern and it should clear up in a day or so.  SYMPTOMS TO REPORT IMMEDIATELY:    Following upper endoscopy (EGD)  Vomiting of blood or coffee ground material  New chest pain or pain under the shoulder blades  Painful or persistently difficult swallowing  New shortness of breath  Fever of 100F or higher  Black, tarry-looking stools  For urgent or emergent issues, a gastroenterologist can be reached at any hour by calling [phone].   DIET:  We do recommend a small meal at first, but then you may proceed to your regular diet.  Drink plenty of fluids but you should avoid alcoholic beverages for 24 hours.  ACTIVITY:  You should plan to take it easy for the rest of today and you should  NOT DRIVE or use heavy machinery until tomorrow (because of the sedation medicines used during the test).    FOLLOW UP: Our staff will call the number listed on your records the next business day following your procedure to check on you and address any questions or concerns that you may have regarding the information given to you following your procedure. If we do not reach you, we will leave a message.  However, if you are feeling well and you are not experiencing any problems, there is no need to return our call.  We will assume that you have returned to your regular daily activities without incident.  If any biopsies were taken you will be contacted by phone or by letter within the next 1-3 weeks.  Please call us at [phone] if you have not heard about the biopsies in 3 weeks.    SIGNATURES/CONFIDENTIALITY: You and/or your care partner have signed paperwork which will be entered into your electronic medical record.  These signatures attest to the fact that that the information above on your After Visit Summary has been reviewed and is understood.  Full responsibility of the confidentiality of this discharge information lies with you and/or your care-partner.

## 2018-07-03 NOTE — Op Note (Addendum)
[place] Patient Name: Yolanda Huffman Procedure Date: 07/03/2018 9:50 AM MRN: [id number] Endoscopist: Mauri Pole , MD Age: 78 Referring MD:  Date of Birth: 15-Mar-1941 Gender: Female Account #: [id number] Procedure:                Upper GI endoscopy Indications:              Iron deficiency anemia due to suspected upper                            gastrointestinal bleeding Medicines:                Monitored Anesthesia Care Procedure:                Pre-Anesthesia Assessment:                           - Prior to the procedure, a History and Physical                            was performed, and patient medications and                            allergies were reviewed. The patient's tolerance of                            previous anesthesia was also reviewed. The risks                            and benefits of the procedure and the sedation                            options and risks were discussed with the patient.                            All questions were answered, and informed consent                            was obtained. Prior Anticoagulants: The patient                            last took Coumadin (warfarin) 5 days prior to the                            procedure. ASA Grade Assessment: III - A patient                            with severe systemic disease. After reviewing the                            risks and benefits, the patient was deemed in                            satisfactory condition to undergo the procedure.  After obtaining informed consent, the endoscope was                            passed under direct vision. Throughout the                            procedure, the patient's blood pressure, pulse, and                            oxygen saturations were monitored continuously. The                            Endoscope was introduced through the mouth, and                            advanced to the second part of  duodenum. The upper                            GI endoscopy was accomplished without difficulty.                            The patient tolerated the procedure well. Scope In: Scope Out: Findings:                 Patchy moderate mucosal changes characterized by                            congestion, friability (with contact bleeding),                            sloughing and white specks were found in the entire                            esophagus. Biopsies were taken with a cold forceps                            for histology.                           The stomach was normal.                           The cardia and gastric fundus were normal on                            retroflexion.                           The examined duodenum was normal. Complications:            No immediate complications. Estimated Blood Loss:     Estimated blood loss was minimal. Impression:               - Congested, friable (with contact bleeding), white  specked mucosa in the esophagus. Biopsied.                           - Normal stomach.                           - Normal examined duodenum. Recommendation:           - Resume previous diet.                           - Continue present medications.                           - Await pathology results.                           - Resume Coumadin (warfarin) at prior dose                            tomorrow. Refer to [facility] for further                            adjustment of therapy. Mauri Pole, MD 07/03/2018 10:31:32 AM This report has been signed electronically.

## 2018-07-03 NOTE — Progress Notes (Signed)
Report to PACU, RN, vss, BBS= Clear.  

## 2018-07-04 ENCOUNTER — Telehealth: Payer: Self-pay

## 2018-07-04 NOTE — Telephone Encounter (Signed)
  Follow up Call-  Call back number 07/03/2018  Post procedure Call Back phone  # [phone]  Permission to leave phone message Yes  Some recent data might be hidden     Patient questions:  Do you have a fever, pain , or abdominal swelling? No. Pain Score  0 *  Have you tolerated food without any problems? Yes.    Have you been able to return to your normal activities? Yes.    Do you have any questions about your discharge instructions: Diet   No. Medications  No. Follow up visit  No.  Do you have questions or concerns about your Care? No.  Actions: * If pain score is 4 or above: No action needed, pain <4.

## 2018-07-06 ENCOUNTER — Other Ambulatory Visit: Payer: Self-pay

## 2018-07-06 DIAGNOSIS — B379 Candidiasis, unspecified: Secondary | ICD-10-CM

## 2018-07-06 MED ORDER — FLUCONAZOLE 100 MG PO TABS: 100.0000 mg | ORAL_TABLET | Freq: Every day | ORAL | 0 refills | Status: DC

## 2018-07-09 ENCOUNTER — Ambulatory Visit: Payer: Medicare Other | Admitting: General Practice

## 2018-07-09 ENCOUNTER — Ambulatory Visit: Payer: Medicare Other

## 2018-07-09 DIAGNOSIS — Z7901 Long term (current) use of anticoagulants: Secondary | ICD-10-CM

## 2018-07-09 LAB — POCT INR: INR: 2.3 (ref 2.0–3.0)

## 2018-07-09 NOTE — Patient Instructions (Addendum)
Pre visit review using our clinic review tool, if applicable. No additional management support is needed unless otherwise documented below in the visit note.  Continue to take 1/2 tablet on Monday, Wednesdays and Fridays and Saturdays and 1 tablet all other days.  Re-check in 4 weeks.

## 2018-07-11 ENCOUNTER — Ambulatory Visit: Payer: Medicare Other | Admitting: Oncology

## 2018-07-11 ENCOUNTER — Other Ambulatory Visit: Payer: Medicare Other

## 2018-07-12 ENCOUNTER — Ambulatory Visit: Payer: Medicare Other | Admitting: Cardiology

## 2018-07-13 LAB — CUP PACEART REMOTE DEVICE CHECK
Battery Impedance: 5735 Ohm
Battery Remaining Longevity: 6 mo
Battery Voltage: 2.66 V
Brady Statistic RV Percent Paced: 100 %
Date Time Interrogation Session: 20191121101357
Implantable Lead Implant Date: 20090116
Implantable Lead Implant Date: 20090116
Implantable Lead Location: 753859
Implantable Lead Location: 753860
Implantable Lead Model: 5076
Implantable Lead Model: 5076
Implantable Pulse Generator Implant Date: 20090116
Lead Channel Impedance Value: 482 Ohm
Lead Channel Pacing Threshold Amplitude: 1.125 V
Lead Channel Pacing Threshold Pulse Width: 0.4 ms
Lead Channel Setting Pacing Amplitude: 2.5 V
Lead Channel Setting Pacing Pulse Width: 0.4 ms
Lead Channel Setting Sensing Sensitivity: 2 mV
MDC IDC MSMT LEADCHNL RA IMPEDANCE VALUE: 67 Ohm

## 2018-07-14 ENCOUNTER — Other Ambulatory Visit: Payer: Self-pay | Admitting: Family Medicine

## 2018-07-16 ENCOUNTER — Ambulatory Visit: Payer: Medicare Other

## 2018-07-17 NOTE — Progress Notes (Signed)
[facility]  [phone] Fax:([phone]) [phone]     ID: Yolanda Huffman DOB: [birth year]  MR#: [id number]  BEE#:[id number]  Patient Care Team: Eulas Post, MD as PCP - General (Family Medicine) Donato Heinz, MD as Consulting Physician (Nephrology) Talulah Schirmer, Virgie Dad, MD as Consulting Physician (Oncology) Jovita Kussmaul, MD as Consulting Physician (General Surgery) Delice Bison, Charlestine Massed, NP as Nurse Practitioner (Hematology and Oncology) Mcarthur Rossetti, MD as Consulting Physician (Orthopedic Surgery) [facility] OTHER MD:   CHIEF COMPLAINT: Ductal carcinoma in situ   CURRENT TREATMENT: Anastrozole, denosumab/Prolia   BREAST CANCER HISTORY: From the original intake note:  The patient had routine bilateral screening mammography with tomography [facility] 07/06/2016, showing new calcifications in the left breast. The right breast was unremarkable. Unilateral left diagnostic mammography 07/27/2016 found the breast density to be category B. In the upper inner quadrant of the left breast there was a 0.7 cm group of pleomorphic calcifications.  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  Her subsequent history is as detailed below   INTERVAL HISTORY: Yolanda Huffman returns today for follow-up and treatment of her ductal carcinoma in situ.   She continues on anastrozole. She tolerates this well,without any noticeable side effects.    She also continues on denosumab/Prolia, with her most recent dose on 04/30/2018. She tolerates this well and without any noticeable side effects.     [name]'s last bone density screening on 08/25/2015 at [facility], showed a T-score of -2.7, which is considered osteoporotic. She will have a repeat bone density screening in the last week of  06/2018, as ordered by Dr. Elease Hashimoto.   Her last mammography was on 10/30/2017 at [place].   Since her last visit here, she underwent upper endoscopy by Dr. Nori Riis 07/03/2018 for evaluation of possible source of her iron deficiency anemia.  This did not show cancer but it did show a friable mucosa with biopsies showing (W AAA 20-to 30) candidal esophagitis.  [name] tells me she received fluconazole and that she now can swallow without any difficulty.  REVIEW OF SYSTEMS: Yolanda Huffman states that her iron infusions were wonderful. She recently had a yeast infection and was prescribed diflucan to treat. She said it hurt to swallow at first, but that has since resolved.  She has an appointment with Dr. Marval Regal in 08/2018. For the holidays, she had a wonderful time with her daughter and son-in-law at the [facility]. The patient denies unusual headaches, visual changes, nausea, vomiting, or dizziness. There has been no unusual cough, phlegm production, or pleurisy. This been no change in bowel or bladder habits. The patient denies unexplained fatigue or unexplained weight loss, bleeding, rash, or fever. A detailed review of systems was otherwise noncontributory.    PAST MEDICAL HISTORY: Past Medical History:  Diagnosis Date  . Anemia   . Anemia   . Arthritis    gout  . Atrial fibrillation ([place])    prior Sotalol - d/c'd 2/2 increased creatinine; rate control strategy  . CAD (coronary artery disease)    s/p PCI in [place] in 1/09  . Cancer [facility])    left breast  . Chronic combined systolic and diastolic heart failure (HCC)    Echocardiogram 3/12: Mild LVH, EF 58-83%, normal diastolic function, mild AI, mild MR, PASP 44, normal wall motion  . CKD (  chronic kidney disease)    sees Dr. Burman Foster - Stage 3 ?  Marland Kitchen Diabetes mellitus   . Hyperlipemia   . Hypertension   . MI, old    2009  . Osteoporosis   . Pacemaker   . Pleural effusion   . Pneumonia   . PUD (peptic ulcer disease)  10/2010   duodenal ulcer    PAST SURGICAL HISTORY: Past Surgical History:  Procedure Laterality Date  . BREAST BIOPSY Left 07/29/2016  . BREAST LUMPECTOMY Left 09/19/2016  . BREAST LUMPECTOMY WITH RADIOACTIVE SEED LOCALIZATION Left 09/19/2016   Procedure: LEFT BREAST LUMPECTOMY WITH RADIOACTIVE SEED LOCALIZATION;  Surgeon: Autumn Messing III, MD;  Location: [place];  Service: General;  Laterality: Left;  . CATARACT EXTRACTION Bilateral    01/12/09 and 01/26/09 both eyes  . COLONOSCOPY    . CORONARY ANGIOPLASTY  2009  . PACEMAKER INSERTION  07/13/07  . stent implant  07/13/2007  . TOTAL HIP ARTHROPLASTY Right 07/23/2017   Procedure: TOTAL HIP ARTHROPLASTY ANTERIOR APPROACH;  Surgeon: Mcarthur Rossetti, MD;  Location: [place];  Service: Orthopedics;  Laterality: Right;    FAMILY HISTORY Family History  Problem Relation Age of Onset  . Heart disease Father   . Heart attack Father   . Clotting disorder Father        blood clot  . Hypertension Father   . Heart disease Mother   . Breast cancer Maternal Aunt 33  . Breast cancer Sister 101  . Breast cancer Other 40  . Colon cancer Neg Hx    The patient's father died at age 36 from a myocardial infarction. The patient's mother died from multiple problems including pleurisy at the age of 10. The patient has 3 brothers, 2 sisters. One sister was diagnosed with breast cancer in her late 68s. The other sister had a "vaginal cancer" (? Cervical cancer). A maternal aunt was diagnosed with breast cancer in her 70s and her daughter, the patient's niece was diagnosed with breast cancer in her 98s.   GYNECOLOGIC HISTORY:  No LMP recorded. Patient is postmenopausal. Menarche age 89, first live birth age 64, the patient is GX P1. She stopped having periods in 1992. She did not take hormone replacement. She took oral contraceptives briefly remotely with no complications.    SOCIAL HISTORY: (Updated 07/18/2018) She is originally from [place]. 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 She is a widow. She lives with her cat (adopted at 48 year old on 08/06/2014), Chrissy, in a retirement community. Her daughter Kristie Cowman lives in [place]. She is a Runner, broadcasting/film/video. The patient has no grandchildren. She is not a church attender    ADVANCED DIRECTIVES: The patient's daughter Alleen Borne is her healthcare power of attorney. Alleen Borne can be reached at [phone] or [phone]   HEALTH MAINTENANCE: Social History   Tobacco Use  . Smoking status: Former Smoker    Packs/day: 2.00    Years: 20.00    Pack years: 40.00    Types: Cigarettes    Last attempt to quit: 11/26/1991    Years since quitting: 26.6  . Smokeless tobacco: Never Used  Substance Use Topics  . Alcohol use: No  . Drug use: No     Colonoscopy:January 2017/ [facility]  PAP:  Bone density: March 2017 showed a T score of -2.7 (osteoporosis) right femoral neck   Allergies  Allergen Reactions  . Amlodipine Besylate Swelling    edema    Current Outpatient Medications  Medication Sig Dispense Refill  . anastrozole (ARIMIDEX) 1 MG tablet TAKE 1 TABLET BY MOUTH DAILY 90 tablet 3  . atorvastatin (LIPITOR) 40 MG tablet TAKE ONE TABLET BY MOUTH DAILY 90 tablet 1  . calcitRIOL (ROCALTROL) 0.25 MCG capsule Take 0.25 mcg by mouth daily. In the morning.    . calcium carbonate (TUMS - DOSED IN MG ELEMENTAL CALCIUM) 500 MG chewable tablet Chew 1 tablet by mouth daily.    . colchicine 0.6 MG tablet TAKE 1 TABLET (0.6 MG TOTAL) BY MOUTH AS NEEDED AS DIRECTED. (Patient not taking: No sig reported) 30 tablet 4  . Cranberry 500 MG CAPS Take 500 mg by mouth daily with lunch.    . denosumab (PROLIA) 60 MG/ML SOSY injection Inject 60 mg into the skin every 6 (six) months.    . enoxaparin (LOVENOX) 60 MG/0.6ML injection Inject 0.6 mLs (60 mg total) into the skin daily. 7 Syringe 0  . feeding supplement, GLUCERNA SHAKE, (GLUCERNA SHAKE) LIQD Take 237 mLs by mouth 2 (two) times daily between meals.  0  .  fluconazole (DIFLUCAN) 100 MG tablet Take 1 tablet (100 mg total) by mouth daily. 10 tablet 0  . furosemide (LASIX) 40 MG tablet TAKE TWO TABLETS BY MOUTH EVERY MORNING AND TAKE ONE TABLET BY MOUTH EVERY EVENING 270 tablet 0  . glucose blood test strip 1 each by Other route 3 (three) times daily. Use as instructed     . Insulin Detemir (LEVEMIR FLEXTOUCH) 100 UNIT/ML Pen Inject 12 Units into the skin daily. 15 pen 2  . metolazone (ZAROXOLYN) 2.5 MG tablet TAKE 1 TABLET BY MOUTH EVERY MONDAY, WEDNESDAY, AND FRIDAY IN THE MORNING 36 tablet 0  . metoprolol succinate (TOPROL-XL) 25 MG 24 hr tablet TAKE TWO TABLET BY MOUTH EVERY MORNING AND THEN ONE TABLET BY MOUTH EVERY EVENING 270 tablet 0  . Multiple Vitamin (MULTIVITAMIN WITH MINERALS) TABS tablet Take 1 tablet by mouth daily. FOR ADULT 50+    . Omega-3 Fatty Acids (FISH OIL) 1000 MG CAPS Take 1,000 mg by mouth 2 (two) times daily.    . pantoprazole (PROTONIX) 40 MG tablet TAKE ONE TABLET BY MOUTH DAILY 90 tablet 4  . potassium chloride SA (K-DUR,KLOR-CON) 20 MEQ tablet TAKE 2 TABLET BY MOUTH TWO TIMES A DAY WITH FOOD AND WATER. (Patient taking differently: Take 40 mEq by mouth. TAKE 2 TABLET BY MOUTH TWO TIMES A DAY WITH FOOD AND WATER.) 360 tablet 3  . warfarin (COUMADIN) 5 MG tablet Take 1/2 tablet daily except 1 on sun Tues Thurs or TAKE AS DIRECTED BY ANTICOAGULATION CLINIC (Patient taking differently: 2.5 mg on Mon, Wed, Fri and Sat 5 mg on Tues Thurs Sun) 30 tablet 3   No current facility-administered medications for this visit.     OBJECTIVE: Elderly white woman in no acute distress  Vitals:   07/18/18 1016  BP: (!) 129/54  Pulse: 88  Resp: 18  Temp: (!) 97.3 F (36.3 C)  SpO2: 100%     Body mass index is 26.22 kg/m.    ECOG FS:1 - Symptomatic but completely ambulatory  Sclerae unicteric, EOMs intact No cervical or supraclavicular adenopathy Lungs no rales or rhonchi Heart regular rate and rhythm Abd soft, nontender, positive  bowel sounds MSK no focal spinal tenderness, no upper extremity lymphedema Neuro: nonfocal, well oriented, appropriate affect Breasts: The right breast is unremarkable.  The left breast is status post lumpectomy.  There is no evidence of disease recurrence.  Both axillae are  benign.   LAB RESULTS:  CMP     Component Value Date/Time   NA 133 (L) 07/18/2018 1001   NA 135 (A) 04/26/2018   NA 134 (L) 04/18/2017 0950   K 5.1 07/18/2018 1001   K 3.4 (L) 04/18/2017 0950   CL 97 (L) 07/18/2018 1001   CO2 24 07/18/2018 1001   CO2 26 04/18/2017 0950   GLUCOSE 149 (H) 07/18/2018 1001   GLUCOSE 139 04/18/2017 0950   BUN 75 (H) 07/18/2018 1001   BUN 42 (A) 04/26/2018   BUN 53.9 (H) 04/18/2017 0950   CREATININE 3.18 (HH) 07/18/2018 1001   CREATININE 2.4 (H) 04/18/2017 0950   CALCIUM 11.1 (H) 07/18/2018 1001   CALCIUM 9.1 04/18/2017 0950   PROT 7.8 07/18/2018 1001   PROT 7.4 04/18/2017 0950   ALBUMIN 3.7 07/18/2018 1001   ALBUMIN 3.4 (L) 04/18/2017 0950   AST 18 07/18/2018 1001   AST 28 04/18/2017 0950   ALT 15 07/18/2018 1001   ALT 18 04/18/2017 0950   ALKPHOS 43 07/18/2018 1001   ALKPHOS 42 04/18/2017 0950   BILITOT 0.4 07/18/2018 1001   BILITOT 0.55 04/18/2017 0950   GFRNONAA 13 (L) 07/18/2018 1001   GFRAA 15 (L) 07/18/2018 1001    INo results found for: SPEP, UPEP  Lab Results  Component Value Date   WBC 7.1 07/18/2018   NEUTROABS 5.0 07/18/2018   HGB 12.1 07/18/2018   HCT 36.5 07/18/2018   MCV 86.5 07/18/2018   PLT 181 07/18/2018      Chemistry      Component Value Date/Time   NA 133 (L) 07/18/2018 1001   NA 135 (A) 04/26/2018   NA 134 (L) 04/18/2017 0950   K 5.1 07/18/2018 1001   K 3.4 (L) 04/18/2017 0950   CL 97 (L) 07/18/2018 1001   CO2 24 07/18/2018 1001   CO2 26 04/18/2017 0950   BUN 75 (H) 07/18/2018 1001   BUN 42 (A) 04/26/2018   BUN 53.9 (H) 04/18/2017 0950   CREATININE 3.18 (HH) 07/18/2018 1001   CREATININE 2.4 (H) 04/18/2017 0950   GLU 79  04/26/2018      Component Value Date/Time   CALCIUM 11.1 (H) 07/18/2018 1001   CALCIUM 9.1 04/18/2017 0950   ALKPHOS 43 07/18/2018 1001   ALKPHOS 42 04/18/2017 0950   AST 18 07/18/2018 1001   AST 28 04/18/2017 0950   ALT 15 07/18/2018 1001   ALT 18 04/18/2017 0950   BILITOT 0.4 07/18/2018 1001   BILITOT 0.55 04/18/2017 0950       No results found for: LABCA2  No components found for: [id number]  No results for input(s): INR in the last 168 hours.  Urinalysis    Component Value Date/Time   COLORURINE LT. YELLOW 07/01/2011 [place] 07/01/2011 1203   LABSPEC 1.010 07/01/2011 1203   PHURINE 6.0 07/01/2011 1203   GLUCOSEU NEGATIVE 07/01/2011 1203   HGBUR TRACE-LYSED 07/01/2011 1203   BILIRUBINUR neg 05/02/2013 1215   KETONESUR NEGATIVE 07/01/2011 1203   PROTEINUR 1 05/02/2013 1215   PROTEINUR NEGATIVE 09/15/2010 0449   UROBILINOGEN 0.2 05/02/2013 1215   UROBILINOGEN 0.2 07/01/2011 1203   NITRITE neg 05/02/2013 1215   NITRITE NEGATIVE 07/01/2011 1203   LEUKOCYTESUR moderate (2+) 05/02/2013 1215     STUDIES: EGD studies discussed.   ELIGIBLE FOR AVAILABLE RESEARCH PROTOCOL: COMET/ discussed 08/23/2016; patient declines participation   ASSESSMENT: 78 y.o. [place] woman status post left breast upper inner quadrant biopsy 07/29/2016 for  ductal carcinoma in situ, grade 2, estrogen and progesterone receptor positive  (1) Genetics testing 08/23/2016 through the Multi-Gene Panel offered by [facility] found no deleterious mutations in ALK, APC, ATM, AXIN2,BAP1,  BARD1, BLM, BMPR1A, BRCA1, BRCA2, BRIP1, CASR, CDC73, CDH1, CDK4, CDKN1B, CDKN1C, CDKN2A (p14ARF), CDKN2A (p16INK4a), CEBPA, CHEK2, DICER1, CIS3L2, EGFR (c.2369C>T, p.Thr790Met variant only), EPCAM (Deletion/duplication testing only), FH, FLCN, GATA2, GPC3, GREM1 (Promoter region deletion/duplication testing only), HOXB13 (c.251G>A, p.Gly84Glu), HRAS, KIT, MAX, MEN1, MET, MITF (c.952G>A, p.Glu318Lys  variant only), MLH1, MSH2, MSH6, MUTYH, NBN, NF1, NF2, PALB2, PDGFRA, PHOX2B, PMS2, POLD1, POLE, POT1, PRKAR1A, PTCH1, PTEN, RAD50, RAD51C, RAD51D, RB1, RECQL4, RET, RUNX1, SDHAF2, SDHA (sequence changes only), SDHB, SDHC, SDHD, SMAD4, SMARCA4, SMARCB1, SMARCE1, STK11, SUFU, TERT, TERT, TMEM127, TP53, TSC1, TSC2, VHL, WRN and WT1  (2) considered the COMET trial: patient declines  (3) left lumpectomy 09/19/2016 found ductal carcinoma in situ, grade 2, measuring 0.6 cm, with negative margins.  (4) advised against adjuvant radiation since there would be no survival advantage in this setting and the patient's pacemaker would have to be moved  (5) anastrozole started 09/27/2016  (a) bone density 08/25/2015 found a T score of -2.7, consistent with osteoporosis   (b) denosumab/Prolia started 10/25/2016, repeated every 24 weeks  (c) repeat bone density scheduled per Dr. Elease Hashimoto for late January 2020  (4) iron deficiency anemia  (a) status post Feraheme 05/01/2019  (b) EGD 07/03/2018 showed candidal esophagitis      PLAN: Ahlivia is now close to a year out from definitive surgery for her breast cancer with no evidence of disease activity.  This is favorable.  She is tolerating anastrozole well and the plan will be to continue that a total of 5 years.  Her iron deficiency anemia is resolved for now.  I am going to repeat some iron studies when she returns to see me in May, after her next mammography.  She continues on Prolia and will have a bone density under Dr. Elease Hashimoto next week.  Hopefully it will show at least stable disease  Her other major issue of course is the kidneys and she will be seeing Dr. Marval Regal and renal later this month.  Her creatinine today is stable.  Tesneem knows to call me for any other issue that may develop before the next visit.   Emmalynne Courtney, Virgie Dad, MD  07/18/18 10:55 AM Medical Oncology and Hematology [facility] [address] [place],  [place] [address] Tel. [phone]    Fax. [phone]  I, Jacqualyn Posey am acting as a Education administrator for Chauncey Cruel, MD.   I, Lurline Del MD, have reviewed the above documentation for accuracy and completeness, and I agree with the above.

## 2018-07-18 ENCOUNTER — Inpatient Hospital Stay: Payer: Medicare Other | Admitting: Oncology

## 2018-07-18 ENCOUNTER — Telehealth: Payer: Self-pay | Admitting: Oncology

## 2018-07-18 ENCOUNTER — Other Ambulatory Visit: Payer: Medicare Other

## 2018-07-18 ENCOUNTER — Ambulatory Visit: Payer: Medicare Other | Admitting: Oncology

## 2018-07-18 ENCOUNTER — Inpatient Hospital Stay: Payer: Medicare Other | Attending: Oncology

## 2018-07-18 VITALS — BP 129/54 | HR 88 | Temp 97.3°F | Resp 18 | Ht 63.0 in | Wt 148.0 lb

## 2018-07-18 DIAGNOSIS — M8588 Other specified disorders of bone density and structure, other site: Secondary | ICD-10-CM

## 2018-07-18 DIAGNOSIS — Z87891 Personal history of nicotine dependence: Secondary | ICD-10-CM | POA: Diagnosis not present

## 2018-07-18 DIAGNOSIS — D0512 Intraductal carcinoma in situ of left breast: Secondary | ICD-10-CM | POA: Diagnosis not present

## 2018-07-18 DIAGNOSIS — M818 Other osteoporosis without current pathological fracture: Secondary | ICD-10-CM

## 2018-07-18 DIAGNOSIS — I5043 Acute on chronic combined systolic (congestive) and diastolic (congestive) heart failure: Secondary | ICD-10-CM

## 2018-07-18 DIAGNOSIS — D649 Anemia, unspecified: Secondary | ICD-10-CM

## 2018-07-18 LAB — CBC WITH DIFFERENTIAL/PLATELET
Abs Immature Granulocytes: 0.03 10*3/uL (ref 0.00–0.07)
Basophils Absolute: 0.1 10*3/uL (ref 0.0–0.1)
Basophils Relative: 1 %
Eosinophils Absolute: 0.1 10*3/uL (ref 0.0–0.5)
Eosinophils Relative: 1 %
HCT: 36.5 % (ref 36.0–46.0)
HEMOGLOBIN: 12.1 g/dL (ref 12.0–15.0)
Immature Granulocytes: 0 %
Lymphocytes Relative: 18 %
Lymphs Abs: 1.3 10*3/uL (ref 0.7–4.0)
MCH: 28.7 pg (ref 26.0–34.0)
MCHC: 33.2 g/dL (ref 30.0–36.0)
MCV: 86.5 fL (ref 80.0–100.0)
Monocytes Absolute: 0.6 10*3/uL (ref 0.1–1.0)
Monocytes Relative: 8 %
Neutro Abs: 5 10*3/uL (ref 1.7–7.7)
Neutrophils Relative %: 72 %
Platelets: 181 10*3/uL (ref 150–400)
RBC: 4.22 MIL/uL (ref 3.87–5.11)
RDW: 21.1 % — ABNORMAL HIGH (ref 11.5–15.5)
WBC: 7.1 10*3/uL (ref 4.0–10.5)
nRBC: 0 % (ref 0.0–0.2)

## 2018-07-18 LAB — COMPREHENSIVE METABOLIC PANEL
ALT: 15 U/L (ref 0–44)
AST: 18 U/L (ref 15–41)
Albumin: 3.7 g/dL (ref 3.5–5.0)
Alkaline Phosphatase: 43 U/L (ref 38–126)
Anion gap: 12 (ref 5–15)
BUN: 75 mg/dL — ABNORMAL HIGH (ref 8–23)
CHLORIDE: 97 mmol/L — AB (ref 98–111)
CO2: 24 mmol/L (ref 22–32)
Calcium: 11.1 mg/dL — ABNORMAL HIGH (ref 8.9–10.3)
Creatinine, Ser: 3.18 mg/dL (ref 0.44–1.00)
GFR calc Af Amer: 15 mL/min — ABNORMAL LOW (ref >60)
GFR calc non Af Amer: 13 mL/min — ABNORMAL LOW (ref >60)
Glucose, Bld: 149 mg/dL — ABNORMAL HIGH (ref 70–99)
Potassium: 5.1 mmol/L (ref 3.5–5.1)
Sodium: 133 mmol/L — ABNORMAL LOW (ref 135–145)
Total Bilirubin: 0.4 mg/dL (ref 0.3–1.2)
Total Protein: 7.8 g/dL (ref 6.5–8.1)

## 2018-07-18 LAB — RETICULOCYTES
Immature Retic Fract: 10.8 % (ref 2.3–15.9)
RBC.: 4.22 MIL/uL (ref 3.87–5.11)
Retic Count, Absolute: 54.9 10*3/uL (ref 19.0–186.0)
Retic Ct Pct: 1.3 % (ref 0.4–3.1)

## 2018-07-18 LAB — IRON AND TIBC
Iron: 96 ug/dL (ref 41–142)
Saturation Ratios: 36 % (ref 21–57)
TIBC: 270 ug/dL (ref 236–444)
UIBC: 174 ug/dL (ref 120–384)

## 2018-07-18 LAB — FERRITIN: Ferritin: 220 ng/mL (ref 11–307)

## 2018-07-18 NOTE — Telephone Encounter (Signed)
Gave avs and calendar ° °

## 2018-07-19 ENCOUNTER — Ambulatory Visit (INDEPENDENT_AMBULATORY_CARE_PROVIDER_SITE_OTHER): Payer: Medicare Other

## 2018-07-19 DIAGNOSIS — I495 Sick sinus syndrome: Secondary | ICD-10-CM

## 2018-07-20 ENCOUNTER — Encounter: Payer: Self-pay | Admitting: Cardiology

## 2018-07-20 NOTE — Progress Notes (Signed)
Remote pacemaker transmission.   

## 2018-07-22 LAB — CUP PACEART REMOTE DEVICE CHECK
Battery Voltage: 2.65 V
Brady Statistic RV Percent Paced: 100 %
Date Time Interrogation Session: 20200123112532
Implantable Lead Implant Date: 20090116
Implantable Lead Location: 753859
Implantable Lead Location: 753860
Implantable Lead Model: 5076
Implantable Lead Model: 5076
Implantable Pulse Generator Implant Date: 20090116
Lead Channel Impedance Value: 456 Ohm
Lead Channel Impedance Value: 67 Ohm
Lead Channel Pacing Threshold Amplitude: 1.25 V
Lead Channel Setting Pacing Amplitude: 2.5 V
Lead Channel Setting Pacing Pulse Width: 0.4 ms
Lead Channel Setting Sensing Sensitivity: 2 mV
MDC IDC LEAD IMPLANT DT: 20090116
MDC IDC MSMT BATTERY IMPEDANCE: 6098 Ohm
MDC IDC MSMT BATTERY REMAINING LONGEVITY: 5 mo
MDC IDC MSMT LEADCHNL RV PACING THRESHOLD PULSEWIDTH: 0.4 ms

## 2018-07-23 ENCOUNTER — Encounter: Payer: Self-pay | Admitting: Physician Assistant

## 2018-07-23 ENCOUNTER — Ambulatory Visit: Payer: Medicare Other | Admitting: Physician Assistant

## 2018-07-23 VITALS — BP 126/60 | HR 72 | Ht 60.25 in | Wt 145.1 lb

## 2018-07-23 DIAGNOSIS — K5730 Diverticulosis of large intestine without perforation or abscess without bleeding: Secondary | ICD-10-CM

## 2018-07-23 DIAGNOSIS — B3781 Candidal esophagitis: Secondary | ICD-10-CM

## 2018-07-23 DIAGNOSIS — Z8719 Personal history of other diseases of the digestive system: Secondary | ICD-10-CM | POA: Diagnosis not present

## 2018-07-23 DIAGNOSIS — D509 Iron deficiency anemia, unspecified: Secondary | ICD-10-CM | POA: Diagnosis not present

## 2018-07-23 DIAGNOSIS — K219 Gastro-esophageal reflux disease without esophagitis: Secondary | ICD-10-CM

## 2018-07-23 NOTE — Progress Notes (Signed)
Subjective:    Patient ID: Yolanda Huffman, female    DOB: 1941-04-09, 78 y.o.   MRN: [id number]  HPI [name] is a pleasant 78 year old white female, known to Dr. Silverio Decamp, who comes in today for follow-up after recent upper endoscopy. She had been seen in December 2019 by Dr. Silverio Decamp for iron deficiency anemia.  Patient had a hemoglobin of 7.1 in October 2019 and required transfusions. She was scheduled for EGD which was done a couple of weeks ago with finding of patchy esophagitis through the entire esophageal gas with some congestion and friability.  Biopsies were taken and positive for Candida.  She was started on a 10-day course of Diflucan which she has just completed. Patient says she is feeling pretty good, had not really been experiencing any dysphagia or odynophagia prior to the EGD. She has no current complaints of abdominal discomfort, no melena or hematochezia. Colonoscopy was done in February 2017 with finding of severe diverticulosis throughout the entire colon with some luminal narrowing. Patient also has history of adult onset diabetes mellitus, chronic kidney disease stage IV, congestive heart failure, atrial fibrillation for which she is on Coumadin, hypertension, prior pacemaker placement, history of breast cancer and is being evaluated for mitral valve disease. Most recent labs done 07/18/2018 with hemoglobin 12.1 hematocrit of 36, ferritin 220, serum iron 96 and iron saturation of 36. She has not been on recent oral iron, does stay on chronic pantoprazole 40 mg.  Review of Systems;Pertinent positive and negative review of systems were noted in the above HPI section.  All other review of systems was otherwise negative.  Outpatient Encounter Medications as of 07/23/2018  Medication Sig  . anastrozole (ARIMIDEX) 1 MG tablet TAKE 1 TABLET BY MOUTH DAILY  . atorvastatin (LIPITOR) 40 MG tablet TAKE ONE TABLET BY MOUTH DAILY  . calcitRIOL (ROCALTROL) 0.25 MCG capsule Take 0.25 mcg by  mouth daily. In the morning.  . calcium carbonate (TUMS - DOSED IN MG ELEMENTAL CALCIUM) 500 MG chewable tablet Chew 1 tablet by mouth daily.  . Cranberry 500 MG CAPS Take 500 mg by mouth daily with lunch.  . denosumab (PROLIA) 60 MG/ML SOSY injection Inject 60 mg into the skin every 6 (six) months.  . feeding supplement, GLUCERNA SHAKE, (GLUCERNA SHAKE) LIQD Take 237 mLs by mouth 2 (two) times daily between meals.  . furosemide (LASIX) 40 MG tablet TAKE TWO TABLETS BY MOUTH EVERY MORNING AND TAKE ONE TABLET BY MOUTH EVERY EVENING  . glucose blood test strip 1 each by Other route 3 (three) times daily. Use as instructed   . Insulin Detemir (LEVEMIR FLEXTOUCH) 100 UNIT/ML Pen Inject 12 Units into the skin daily.  . metolazone (ZAROXOLYN) 2.5 MG tablet TAKE 1 TABLET BY MOUTH EVERY MONDAY, WEDNESDAY, AND FRIDAY IN THE MORNING  . metoprolol succinate (TOPROL-XL) 25 MG 24 hr tablet TAKE TWO TABLET BY MOUTH EVERY MORNING AND THEN ONE TABLET BY MOUTH EVERY EVENING  . Multiple Vitamin (MULTIVITAMIN WITH MINERALS) TABS tablet Take 1 tablet by mouth daily. FOR ADULT 50+  . Omega-3 Fatty Acids (FISH OIL) 1000 MG CAPS Take 1,000 mg by mouth 2 (two) times daily.  . pantoprazole (PROTONIX) 40 MG tablet TAKE ONE TABLET BY MOUTH DAILY  . potassium chloride SA (K-DUR,KLOR-CON) 20 MEQ tablet TAKE 2 TABLET BY MOUTH TWO TIMES A DAY WITH FOOD AND WATER. (Patient taking differently: Take 40 mEq by mouth. TAKE 2 TABLET BY MOUTH TWO TIMES A DAY WITH FOOD AND WATER.)  .  warfarin (COUMADIN) 5 MG tablet Take 1/2 tablet daily except 1 on sun Tues Thurs or TAKE AS DIRECTED BY ANTICOAGULATION CLINIC (Patient taking differently: 2.5 mg on Mon, Wed, Fri and Sat 5 mg on Tues Thurs Sun)  . [DISCONTINUED] colchicine 0.6 MG tablet TAKE 1 TABLET (0.6 MG TOTAL) BY MOUTH AS NEEDED AS DIRECTED. (Patient not taking: No sig reported)  . [DISCONTINUED] enoxaparin (LOVENOX) 60 MG/0.6ML injection Inject 0.6 mLs (60 mg total) into the skin  daily.  . [DISCONTINUED] fluconazole (DIFLUCAN) 100 MG tablet Take 1 tablet (100 mg total) by mouth daily.   No facility-administered encounter medications on file as of 07/23/2018.    Allergies  Allergen Reactions  . Amlodipine Besylate Swelling    edema   Patient Active Problem List   Diagnosis Date Noted  . Iron deficiency anemia due to chronic blood loss 04/18/2018  . AKI (acute kidney injury) ([place]) 04/17/2018  . Symptomatic anemia 04/17/2018  . Status post right hip replacement 09/04/2017  . Chronic combined systolic and diastolic CHF (congestive heart failure) ([place]) 07/25/2017  . Closed displaced fracture of right femoral neck ([place]) 07/22/2017  . Long term (current) use of anticoagulants 03/08/2017  . Genetic testing 09/07/2016  . 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 Gout 11/03/2015  . Personal history of colonic polyps 04/15/2015  . Chronic anticoagulation 04/15/2015  . Insulin dependent diabetes mellitus ([place]) 04/15/2015  . Encounter for therapeutic drug monitoring 08/01/2013  . CKD (chronic kidney disease) 06/14/2011  . Pleural effusion 06/10/2011  . DOE (dyspnea on exertion) 06/10/2011  . Edema 11/30/2010  . Duodenal ulcer 11/12/2010  . Pneumonia 11/12/2010  . Anemia associated with acute blood loss 08/12/2010  . BLOOD IN STOOL 08/06/2010  . ATRIAL FIBRILLATION 03/24/2010  . 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 Hyperlipidemia 02/23/2010  . Essential hypertension 02/23/2010  . Coronary atherosclerosis 02/23/2010  . UTI'S, RECURRENT 02/23/2010  . Osteoporosis 02/23/2010  . Cardiac pacemaker in situ 02/23/2010   Social History   Socioeconomic History  . Marital status: Widowed    Spouse name: Not on file  . Number of children: 1  . Years of education: Not on file  . Highest education level: Not on file  Occupational History  . Occupation: retired Theatre stage manager: RETIRED  Social Needs  . Financial  resource strain: Not on file  . Food insecurity:    Worry: Not on file    Inability: Not on file  . Transportation needs:    Medical: Not on file    Non-medical: Not on file  Tobacco Use  . Smoking status: Former Smoker    Packs/day: 2.00    Years: 20.00    Pack years: 40.00    Types: Cigarettes    Last attempt to quit: 11/26/1991    Years since quitting: 26.6  . Smokeless tobacco: Never Used  Substance and Sexual Activity  . Alcohol use: No  . Drug use: No  . Sexual activity: Not on file  Lifestyle  . Physical activity:    Days per week: Not on file    Minutes per session: Not on file  . Stress: Not on file  Relationships  . Social connections:    Talks on phone: Not on file    Gets together: Not on file    Attends religious service: Not on file    Active member of club or organization: Not on file  Attends meetings of clubs or organizations: Not on file    Relationship status: Not on file  . Intimate partner violence:    Fear of current or ex partner: Not on file    Emotionally abused: Not on file    Physically abused: Not on file    Forced sexual activity: Not on file  Other Topics Concern  . Not on file  Social History Narrative   Reg exercise          Ms. Cocking's family history includes Breast cancer (age of onset: 63) in an other family member; Breast cancer (age of onset: 70) in her maternal aunt; Breast cancer (age of onset: 39) in her sister; Clotting disorder in her father; Heart attack in her father; Heart disease in her father and mother; Hypertension in her father.      Objective:    Vitals:   07/23/18 0914  BP: 126/60  Pulse: 72    Physical Exam; well-developed elderly white female in no acute distress.  Pleasant.  Height 5 foot, weight 145, BMI 28.1.  HEENT; nontraumatic normocephalic EOMI PERRLA sclera anicteric, oral mucosa moist.  Cardiovascular; irregular rate and rhythm with S1-S2, pacemaker left chest wall.  Pulmonary ;clear bilaterally,  Abdomen; obese, soft nontender nondistended bowel sounds are active no palpable mass or hepatosplenomegaly.  Rectal ;exam not done, Extremities; no clubbing cyanosis or edema skin warm and dry, Neuropsych; alert and oriented, grossly nonfocal mood and affect appropriate       Assessment & Plan:   #50 78 year old white female with history of iron deficiency anemia requiring transfusions fall 2019 after found to have a hemoglobin of 7.1.  She has since received iron infusions, most recent labs show normal hemoglobin of 12.1 and iron stores have been replaced. Etiology of the iron deficiency anemia is not clear.  Recent EGD with patchy esophagitis and secondary to candidiasis would not necessarily explain a chronic anemia. Colonoscopy 2017 with severe diverticulosis. Small bowel has not been evaluated, need to rule out occult small bowel lesion, and/or AVMs. She does have stage IV chronic kidney disease which may also be contributing  #2 chronic GERD-stable on Protonix 3.  Candida esophagitis January 2020 treated with Diflucan and currently asymptomatic #4 chronic anticoagulation-on Coumadin 5.  Atrial fibrillation 6.  Congestive heart failure 7.  Status post prior pacemaker 8.  History of breast cancer 9.  Adult onset diabetes mellitus  Plan; Continue Protonix 40 mg p.o. every morning She will be scheduled for Capsule endoscopy.  We discussed capsule endoscopy in detail today and she is agreeable to proceed.  She has has several appointments and tests scheduled for February and would like to do the capsule endoscopy in March which is reasonable. We will plan to check follow-up CBC in a month.  (Patient says she has labs scheduled through [facility] through her nephrologist). 4.  Chronic kidney disease stage IV   Amy S Esterwood PA-C 07/23/2018   Cc: Eulas Post, MD

## 2018-07-23 NOTE — Patient Instructions (Addendum)
We set up an appointment for a capsule endoscopy for 09-11-2018 at 8:00 am. We scheduled an appointment for you to come for the instructions on 09-03-2018.    Normal BMI (Body Mass Index- based on height and weight) is between 23 and 30. Your BMI today is Body mass index is 28.11 kg/m. Marland Kitchen Please consider follow up  regarding your BMI with your Primary Care Provider.

## 2018-07-24 ENCOUNTER — Encounter (HOSPITAL_COMMUNITY): Payer: Self-pay | Admitting: Pharmacy Technician

## 2018-07-24 ENCOUNTER — Ambulatory Visit: Payer: Medicare Other | Admitting: Physician Assistant

## 2018-07-24 ENCOUNTER — Observation Stay (HOSPITAL_COMMUNITY)
Admission: EM | Admit: 2018-07-24 | Discharge: 2018-07-26 | Disposition: A | Discharge status: Home / Self Care | Payer: Medicare Other | Attending: Internal Medicine | Admitting: Internal Medicine

## 2018-07-24 ENCOUNTER — Other Ambulatory Visit: Payer: Self-pay

## 2018-07-24 DIAGNOSIS — I5042 Chronic combined systolic (congestive) and diastolic (congestive) heart failure: Secondary | ICD-10-CM | POA: Diagnosis present

## 2018-07-24 DIAGNOSIS — E785 Hyperlipidemia, unspecified: Secondary | ICD-10-CM | POA: Diagnosis present

## 2018-07-24 DIAGNOSIS — I10 Essential (primary) hypertension: Secondary | ICD-10-CM | POA: Diagnosis present

## 2018-07-24 DIAGNOSIS — D631 Anemia in chronic kidney disease: Secondary | ICD-10-CM | POA: Diagnosis not present

## 2018-07-24 DIAGNOSIS — Z853 Personal history of malignant neoplasm of breast: Secondary | ICD-10-CM | POA: Insufficient documentation

## 2018-07-24 DIAGNOSIS — R791 Abnormal coagulation profile: Secondary | ICD-10-CM | POA: Diagnosis not present

## 2018-07-24 DIAGNOSIS — N185 Chronic kidney disease, stage 5: Secondary | ICD-10-CM | POA: Insufficient documentation

## 2018-07-24 DIAGNOSIS — I4891 Unspecified atrial fibrillation: Secondary | ICD-10-CM | POA: Diagnosis not present

## 2018-07-24 DIAGNOSIS — I2510 Atherosclerotic heart disease of native coronary artery without angina pectoris: Secondary | ICD-10-CM | POA: Diagnosis not present

## 2018-07-24 DIAGNOSIS — Z794 Long term (current) use of insulin: Secondary | ICD-10-CM | POA: Diagnosis not present

## 2018-07-24 DIAGNOSIS — Z79899 Other long term (current) drug therapy: Secondary | ICD-10-CM | POA: Diagnosis not present

## 2018-07-24 DIAGNOSIS — Z7901 Long term (current) use of anticoagulants: Secondary | ICD-10-CM | POA: Insufficient documentation

## 2018-07-24 DIAGNOSIS — I129 Hypertensive chronic kidney disease with stage 1 through stage 4 chronic kidney disease, or unspecified chronic kidney disease: Secondary | ICD-10-CM

## 2018-07-24 DIAGNOSIS — Z950 Presence of cardiac pacemaker: Secondary | ICD-10-CM | POA: Diagnosis not present

## 2018-07-24 DIAGNOSIS — R42 Dizziness and giddiness: Secondary | ICD-10-CM | POA: Diagnosis present

## 2018-07-24 DIAGNOSIS — K922 Gastrointestinal hemorrhage, unspecified: Principal | ICD-10-CM | POA: Diagnosis present

## 2018-07-24 DIAGNOSIS — Z87891 Personal history of nicotine dependence: Secondary | ICD-10-CM | POA: Insufficient documentation

## 2018-07-24 DIAGNOSIS — I132 Hypertensive heart and chronic kidney disease with heart failure and with stage 5 chronic kidney disease, or end stage renal disease: Secondary | ICD-10-CM | POA: Diagnosis not present

## 2018-07-24 DIAGNOSIS — E1122 Type 2 diabetes mellitus with diabetic chronic kidney disease: Secondary | ICD-10-CM | POA: Diagnosis present

## 2018-07-24 DIAGNOSIS — I252 Old myocardial infarction: Secondary | ICD-10-CM | POA: Diagnosis not present

## 2018-07-24 DIAGNOSIS — N184 Chronic kidney disease, stage 4 (severe): Secondary | ICD-10-CM

## 2018-07-24 DIAGNOSIS — N179 Acute kidney failure, unspecified: Secondary | ICD-10-CM

## 2018-07-24 DIAGNOSIS — R5383 Other fatigue: Secondary | ICD-10-CM | POA: Diagnosis present

## 2018-07-24 DIAGNOSIS — E876 Hypokalemia: Secondary | ICD-10-CM | POA: Diagnosis not present

## 2018-07-24 DIAGNOSIS — Z8711 Personal history of peptic ulcer disease: Secondary | ICD-10-CM | POA: Insufficient documentation

## 2018-07-24 DIAGNOSIS — D62 Acute posthemorrhagic anemia: Secondary | ICD-10-CM | POA: Diagnosis present

## 2018-07-24 DIAGNOSIS — R197 Diarrhea, unspecified: Secondary | ICD-10-CM | POA: Diagnosis present

## 2018-07-24 DIAGNOSIS — N189 Chronic kidney disease, unspecified: Secondary | ICD-10-CM

## 2018-07-24 LAB — CBC WITH DIFFERENTIAL/PLATELET
Abs Immature Granulocytes: 0.09 10*3/uL — ABNORMAL HIGH (ref 0.00–0.07)
Basophils Absolute: 0 10*3/uL (ref 0.0–0.1)
Basophils Relative: 0 %
Eosinophils Absolute: 0 10*3/uL (ref 0.0–0.5)
Eosinophils Relative: 0 %
HCT: 22.4 % — ABNORMAL LOW (ref 36.0–46.0)
Hemoglobin: 7.3 g/dL — ABNORMAL LOW (ref 12.0–15.0)
Immature Granulocytes: 1 %
LYMPHS PCT: 6 %
Lymphs Abs: 1 10*3/uL (ref 0.7–4.0)
MCH: 29.1 pg (ref 26.0–34.0)
MCHC: 32.6 g/dL (ref 30.0–36.0)
MCV: 89.2 fL (ref 80.0–100.0)
Monocytes Absolute: 0.6 10*3/uL (ref 0.1–1.0)
Monocytes Relative: 4 %
Neutro Abs: 13.7 10*3/uL — ABNORMAL HIGH (ref 1.7–7.7)
Neutrophils Relative %: 89 %
Platelets: 209 10*3/uL (ref 150–400)
RBC: 2.51 MIL/uL — AB (ref 3.87–5.11)
RDW: 20.3 % — ABNORMAL HIGH (ref 11.5–15.5)
WBC: 15.4 10*3/uL — AB (ref 4.0–10.5)
nRBC: 0 % (ref 0.0–0.2)

## 2018-07-24 LAB — COMPREHENSIVE METABOLIC PANEL
ALT: 15 U/L (ref 0–44)
AST: 24 U/L (ref 15–41)
Albumin: 3.2 g/dL — ABNORMAL LOW (ref 3.5–5.0)
Alkaline Phosphatase: 27 U/L — ABNORMAL LOW (ref 38–126)
Anion gap: 15 (ref 5–15)
BUN: 99 mg/dL — AB (ref 8–23)
CO2: 17 mmol/L — ABNORMAL LOW (ref 22–32)
Calcium: 10.8 mg/dL — ABNORMAL HIGH (ref 8.9–10.3)
Chloride: 101 mmol/L (ref 98–111)
Creatinine, Ser: 3.65 mg/dL — ABNORMAL HIGH (ref 0.44–1.00)
GFR calc Af Amer: 13 mL/min — ABNORMAL LOW (ref >60)
GFR, EST NON AFRICAN AMERICAN: 11 mL/min — AB (ref >60)
Glucose, Bld: 230 mg/dL — ABNORMAL HIGH (ref 70–99)
Potassium: 4.6 mmol/L (ref 3.5–5.1)
Sodium: 133 mmol/L — ABNORMAL LOW (ref 135–145)
Total Bilirubin: 0.6 mg/dL (ref 0.3–1.2)
Total Protein: 6.2 g/dL — ABNORMAL LOW (ref 6.5–8.1)

## 2018-07-24 LAB — PROTIME-INR: INR: >10

## 2018-07-24 LAB — SAMPLE TO BLOOD BANK

## 2018-07-24 LAB — POC OCCULT BLOOD, ED: Fecal Occult Bld: POSITIVE — AB

## 2018-07-24 LAB — ABO/RH: ABO/RH(D): A NEG

## 2018-07-24 MED ORDER — PANTOPRAZOLE SODIUM 40 MG IV SOLR
40.0000 mg | Freq: Once | INTRAVENOUS | Status: AC
  Administered 2018-07-24: 40 mg via INTRAVENOUS
  Filled 2018-07-24: qty 40

## 2018-07-24 MED ORDER — SODIUM CHLORIDE 0.9 % IV SOLN
8.0000 mg/h | INTRAVENOUS | Status: DC
  Administered 2018-07-25: 8 mg/h via INTRAVENOUS
  Filled 2018-07-24 (×2): qty 80

## 2018-07-24 MED ORDER — SODIUM CHLORIDE 0.9 % IV SOLN
80.0000 mg | Freq: Once | INTRAVENOUS | Status: AC
  Administered 2018-07-24: 80 mg via INTRAVENOUS
  Filled 2018-07-24: qty 80

## 2018-07-24 MED ORDER — SODIUM CHLORIDE 0.9 % IV SOLN
10.0000 mL/h | Freq: Once | INTRAVENOUS | Status: AC
  Administered 2018-07-25: 10 mL/h via INTRAVENOUS

## 2018-07-24 MED ORDER — VITAMIN K1 10 MG/ML IJ SOLN
10.0000 mg | Freq: Once | INTRAVENOUS | Status: AC
  Administered 2018-07-25: 10 mg via INTRAVENOUS
  Filled 2018-07-24: qty 1

## 2018-07-24 MED ORDER — PROTHROMBIN COMPLEX CONC HUMAN 500 UNITS IV KIT
2120.0000 [IU] | PACK | Freq: Once | Status: AC
  Administered 2018-07-25: 2120 [IU] via INTRAVENOUS
  Filled 2018-07-24: qty 2120

## 2018-07-24 NOTE — ED Provider Notes (Addendum)
[facility] EMERGENCY DEPARTMENT Provider Note   CSN: [id number] Arrival date & time: 07/24/18  2146     History   Chief Complaint Chief Complaint  Patient presents with  . GI Bleeding    HPI Yolanda Huffman is a 78 y.o. female.  78 year old female with a history of diabetes, dyslipidemia, hypertension, CAD and CHF s/p pacemaker placement, Afib (on chronic coumadin), PUD, CKD presents to the emergency department for evaluation of weakness.  She states that she began to feel tired on Sunday evening.  She noticed increased fatigue when ambulating to the bathroom to void.  She reports that her fatigue has progressively worsened, most notably this morning.  She began to have diarrhea yesterday afternoon which transitioned to watery stool mixed with bright red blood.  She feels that the majority of the bowel movements she has continued to have have contained only blood.  Patient reporting associated dyspnea on exertion with lightheadedness.  No chest pain, abdominal pain, fevers, or syncope.  She did have some left arm soreness earlier today, but this has spontaneously resolved.  She has not had any falls or injury.  GI - Dr. Silverio Decamp  The history is provided by the patient. No language interpreter was used.    Past Medical History:  Diagnosis Date  . Anemia   . Anemia   . Arthritis    gout  . Atrial fibrillation ([place])    prior Sotalol - d/c'd 2/2 increased creatinine; rate control strategy  . CAD (coronary artery disease)    s/p PCI in [place] in 1/09  . Cancer [facility])    left breast  . Chronic combined systolic and diastolic heart failure (HCC)    Echocardiogram 3/12: Mild LVH, EF 94-85%, normal diastolic function, mild AI, mild MR, PASP 44, normal wall motion  . CKD (chronic kidney disease)    sees Dr. Burman Foster - Stage 3 ?  Marland Kitchen Diabetes mellitus   . Hyperlipemia   . Hypertension   . MI, old    2009  . Osteoporosis   . Pacemaker   . Pleural effusion   .  Pneumonia   . PUD (peptic ulcer disease) 10/2010   duodenal ulcer    Patient Active Problem List   Diagnosis Date Noted  . Iron deficiency anemia due to chronic blood loss 04/18/2018  . AKI (acute kidney injury) ([place]) 04/17/2018  . Symptomatic anemia 04/17/2018  . Status post right hip replacement 09/04/2017  . Chronic combined systolic and diastolic CHF (congestive heart failure) ([place]) 07/25/2017  . Closed displaced fracture of right femoral neck ([place]) 07/22/2017  . Long term (current) use of anticoagulants 03/08/2017  . Genetic testing 09/07/2016  . 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 Gout 11/03/2015  . Personal history of colonic polyps 04/15/2015  . Chronic anticoagulation 04/15/2015  . Insulin dependent diabetes mellitus ([place]) 04/15/2015  . Encounter for therapeutic drug monitoring 08/01/2013  . CKD (chronic kidney disease) 06/14/2011  . Pleural effusion 06/10/2011  . DOE (dyspnea on exertion) 06/10/2011  . Edema 11/30/2010  . Duodenal ulcer 11/12/2010  . Pneumonia 11/12/2010  . Anemia associated with acute blood loss 08/12/2010  . BLOOD IN STOOL 08/06/2010  . ATRIAL FIBRILLATION 03/24/2010  . 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 Hyperlipidemia 02/23/2010  . Essential hypertension 02/23/2010  . Coronary atherosclerosis 02/23/2010  . UTI'S, RECURRENT 02/23/2010  . Osteoporosis 02/23/2010  . Cardiac pacemaker in situ 02/23/2010  Past Surgical History:  Procedure Laterality Date  . BREAST BIOPSY Left 07/29/2016  . BREAST LUMPECTOMY Left 09/19/2016  . BREAST LUMPECTOMY WITH RADIOACTIVE SEED LOCALIZATION Left 09/19/2016   Procedure: LEFT BREAST LUMPECTOMY WITH RADIOACTIVE SEED LOCALIZATION;  Surgeon: Autumn Messing III, MD;  Location: [place];  Service: General;  Laterality: Left;  . CATARACT EXTRACTION Bilateral    01/12/09 and 01/26/09 both eyes  . COLONOSCOPY    . CORONARY ANGIOPLASTY  2009  . PACEMAKER INSERTION  07/13/07    . stent implant  07/13/2007  . TOTAL HIP ARTHROPLASTY Right 07/23/2017   Procedure: TOTAL HIP ARTHROPLASTY ANTERIOR APPROACH;  Surgeon: Mcarthur Rossetti, MD;  Location: [place];  Service: Orthopedics;  Laterality: Right;     OB History   No obstetric history on file.      Home Medications    Prior to Admission medications   Medication Sig Start Date End Date Taking? Authorizing Provider  anastrozole (ARIMIDEX) 1 MG tablet TAKE 1 TABLET BY MOUTH DAILY 12/18/17   Magrinat, Virgie Dad, MD  atorvastatin (LIPITOR) 40 MG tablet TAKE ONE TABLET BY MOUTH DAILY 05/04/18   Burchette, Alinda Sierras, MD  calcitRIOL (ROCALTROL) 0.25 MCG capsule Take 0.25 mcg by mouth daily. In the morning. 10/13/14   [provider]  calcium carbonate (TUMS - DOSED IN MG ELEMENTAL CALCIUM) 500 MG chewable tablet Chew 1 tablet by mouth daily.    [provider]  Cranberry 500 MG CAPS Take 500 mg by mouth daily with lunch.    [provider]  denosumab (PROLIA) 60 MG/ML SOSY injection Inject 60 mg into the skin every 6 (six) months.    [provider]  feeding supplement, GLUCERNA SHAKE, (GLUCERNA SHAKE) LIQD Take 237 mLs by mouth 2 (two) times daily between meals. 07/25/17   Debbe Odea, MD  furosemide (LASIX) 40 MG tablet TAKE TWO TABLETS BY MOUTH EVERY MORNING AND TAKE ONE TABLET BY MOUTH EVERY EVENING 07/17/18   Burchette, Alinda Sierras, MD  glucose blood test strip 1 each by Other route 3 (three) times daily. Use as instructed     [provider]  Insulin Detemir (LEVEMIR FLEXTOUCH) 100 UNIT/ML Pen Inject 12 Units into the skin daily. 01/29/18   Burchette, Alinda Sierras, MD  metolazone (ZAROXOLYN) 2.5 MG tablet TAKE 1 TABLET BY MOUTH EVERY MONDAY, WEDNESDAY, AND FRIDAY IN THE MORNING 06/05/18   Burchette, Alinda Sierras, MD  metoprolol succinate (TOPROL-XL) 25 MG 24 hr tablet TAKE TWO TABLET BY MOUTH EVERY MORNING AND THEN ONE TABLET BY MOUTH EVERY EVENING 03/30/18   Burchette, Alinda Sierras, MD  Multiple  Vitamin (MULTIVITAMIN WITH MINERALS) TABS tablet Take 1 tablet by mouth daily. FOR ADULT 50+    [provider]  Omega-3 Fatty Acids (FISH OIL) 1000 MG CAPS Take 1,000 mg by mouth 2 (two) times daily.    [provider]  pantoprazole (PROTONIX) 40 MG tablet TAKE ONE TABLET BY MOUTH DAILY 04/13/18   Burchette, Alinda Sierras, MD  potassium chloride SA (K-DUR,KLOR-CON) 20 MEQ tablet TAKE 2 TABLET BY MOUTH TWO TIMES A DAY WITH FOOD AND WATER. Patient taking differently: Take 40 mEq by mouth. TAKE 2 TABLET BY MOUTH TWO TIMES A DAY WITH FOOD AND WATER. 03/28/18   Evans Lance, MD  warfarin (COUMADIN) 5 MG tablet Take 1/2 tablet daily except 1 on sun Tues Thurs or TAKE AS [place] Patient taking differently: 2.5 mg on Mon, Wed, Fri and Sat 5 mg on Tues Thurs  Nancy Fetter 03/15/18   Eulas Post, MD    Family History Family History  Problem Relation Age of Onset  . Heart disease Father   . Heart attack Father   . Clotting disorder Father        blood clot  . Hypertension Father   . Heart disease Mother   . Breast cancer Maternal Aunt 65  . Breast cancer Sister 40  . Breast cancer Other 40  . Colon cancer Neg Hx     Social History Social History   Tobacco Use  . Smoking status: Former Smoker    Packs/day: 2.00    Years: 20.00    Pack years: 40.00    Types: Cigarettes    Last attempt to quit: 11/26/1991    Years since quitting: 26.6  . Smokeless tobacco: Never Used  Substance Use Topics  . Alcohol use: No  . Drug use: No     Allergies   Amlodipine besylate   Review of Systems Review of Systems Ten systems reviewed and are negative for acute change, except as noted in the HPI.    Physical Exam Updated Vital Signs BP (!) 140/52   Pulse 75   Resp 17   Ht 5' (1.524 m)   Wt 65.8 kg   SpO2 100%   BMI 28.33 kg/m   Physical Exam Vitals signs and nursing note reviewed.  Constitutional:      General: She is not in acute distress.     Appearance: She is well-developed. She is not diaphoretic.     Comments: Nontoxic appearing and in NAD  HENT:     Head: Normocephalic and atraumatic.  Eyes:     General: No scleral icterus.    Conjunctiva/sclera: Conjunctivae normal.  Neck:     Musculoskeletal: Normal range of motion.  Cardiovascular:     Rate and Rhythm: Normal rate and regular rhythm.     Pulses: Normal pulses.  Pulmonary:     Effort: Pulmonary effort is normal. No respiratory distress.     Breath sounds: No stridor. No wheezing.     Comments: Respirations even and unlabored Abdominal:     Tenderness: There is no abdominal tenderness.     Hernia: A hernia is present.       Comments: Soft, obese, nontender abdomen.  Genitourinary:    Comments: Normal rectal tone. Hematochezia noted on DRE. Musculoskeletal: Normal range of motion.  Skin:    General: Skin is warm and dry.     Coloration: Skin is not pale.     Findings: No erythema or rash.  Neurological:     General: No focal deficit present.     Mental Status: She is alert and oriented to person, place, and time.     Coordination: Coordination normal.  Psychiatric:        Behavior: Behavior normal.      ED Treatments / Results  Labs (all labs ordered are listed, but only abnormal results are displayed) Labs Reviewed  CBC WITH DIFFERENTIAL/PLATELET - Abnormal; Notable for the following components:      Result Value   WBC 15.4 (*)    RBC 2.51 (*)    Hemoglobin 7.3 (*)    HCT 22.4 (*)    RDW 20.3 (*)    Neutro Abs 13.7 (*)    Abs Immature Granulocytes 0.09 (*)    All other components within normal limits  COMPREHENSIVE METABOLIC PANEL - Abnormal; Notable for the following components:   Sodium 133 (*)  CO2 17 (*)    Glucose, Bld 230 (*)    BUN 99 (*)    Creatinine, Ser 3.65 (*)    Calcium 10.8 (*)    Total Protein 6.2 (*)    Albumin 3.2 (*)    Alkaline Phosphatase 27 (*)    GFR calc non Af Amer 11 (*)    GFR calc Af Amer 13 (*)    All  other components within normal limits  PROTIME-INR - Abnormal; Notable for the following components:   Prothrombin Time >90.0 (*)    INR >10.00 (*)    All other components within normal limits  POC OCCULT BLOOD, ED - Abnormal; Notable for the following components:   Fecal Occult Bld POSITIVE (*)    All other components within normal limits  SAMPLE TO BLOOD BANK  TYPE AND SCREEN  ABO/RH  PREPARE RBC (CROSSMATCH)    EKG EKG Interpretation  Date/Time:  Tuesday July 24 2018 22:00:20 EST Ventricular Rate:  73 PR Interval:    QRS Duration: 171 QT Interval:  445 QTC Calculation: 487 R Axis:   89 Text Interpretation:  Atrial fibrillation Nonspecific intraventricular conduction delay Probable anteroseptal infarct, recent Lateral leads are also involved No STEMI Confirmed by Addison Lank ([id number]) on 07/25/2018 12:34:32 AM   Radiology No results found.  Procedures Procedures (including critical care time)   Upper Endoscopy 07/03/18 Findings: - Patchy moderate mucosal changes characterized by congestion, friability (with contact bleeding), sloughing and white specks were found in the entire esophagus. Biopsies were taken with a cold forceps for histology. - The stomach was normal. - The cardia and gastric fundus were normal on retroflexion. - The examined duodenum was normal.  Colonoscopy 07/2015: ENDOSCOPIC IMPRESSION: -There was severe diverticulosis noted throughout the entire examined colon.   Medications Ordered in ED Medications  pantoprazole (PROTONIX) 80 mg in sodium chloride 0.9 % 250 mL (0.32 mg/mL) infusion (has no administration in time range)  phytonadione (VITAMIN K) 10 mg in dextrose 5 % 50 mL IVPB (has no administration in time range)  0.9 %  sodium chloride infusion (has no administration in time range)  prothrombin complex conc human (KCENTRA) IVPB 2,120 Units (has no administration in time range)  pantoprazole (PROTONIX) injection 40 mg (40 mg Intravenous  Given 07/24/18 2348)  pantoprazole (PROTONIX) 80 mg in sodium chloride 0.9 % 100 mL IVPB (80 mg Intravenous New Bag/Given 07/24/18 2352)     CRITICAL CARE Performed by: Antonietta Breach   Total critical care time: 45 minutes  Critical care time was exclusive of separately billable procedures and treating other patients.  Critical care was necessary to treat or prevent imminent or life-threatening deterioration.  Critical care was time spent personally by me on the following activities: development of treatment plan with patient and/or surrogate as well as nursing, discussions with consultants, evaluation of patient's response to treatment, examination of patient, obtaining history from patient or surrogate, ordering and performing treatments and interventions, ordering and review of laboratory studies, ordering and review of radiographic studies, pulse oximetry and re-evaluation of patient's condition.   Initial Impression / Assessment and Plan / ED Course  I have reviewed the triage vital signs and the nursing notes.  Pertinent labs & imaging results that were available during my care of the patient were reviewed by me and considered in my medical decision making (see chart for details).     63:67 PM 78 year old female presents to the emergency department for evaluation of hematochezia in the absence of  abdominal pain.  Symptoms began yesterday and have been persisting with associated fatigue, lightheadedness, dyspnea on exertion.  She saw her gastroenterologist yesterday prior to onset of her hematochezia.  Also underwent recent endoscopy on January 7.  Her hemoglobin has significantly dropped from 12.1 down to 7.3 in the past 6 days.  Leukocytosis suspected secondary to acute lower GI bleed, like diverticular in etiology given findings from 2017 colonoscopy.  There is an AKI with creatinine of 3.65. Her BUN has also increased from 75-99.    The patient has been started on Protonix.  She  will require admission for further management of lower GI bleed.  Transfusion was not emergently ordered given that hemoglobin is still above 7 and the patient is hemodynamically stable without tachycardia, hypoxia, or hypotension.   11:46 PM INR >10.  Took her Coumadin at 1900 tonight.  Have initiated rapid reversal protocol including vitamin K and Kcentra.  Have spoken with Dr. Henrene Pastor on-call for low [facility].  Plan for GI consultation in the morning.  Recommends transfusion as well as reversal of coags.  12:21 AM Case discussed with Dr. Posey Pronto of [facility] who will admit.  Vitals:   07/24/18 2215 07/24/18 2245 07/24/18 2330 07/24/18 2345  BP: (!) 123/51 133/67 (!) 134/55 (!) 140/52  Pulse: 70 74 70 75  Resp: 19 16 14 17   SpO2: 100% 100% 100% 100%  Weight:      Height:        Final Clinical Impressions(s) / ED Diagnoses   Final diagnoses:  Acute lower GI bleeding  AKI (acute kidney injury) [facility])  Supratherapeutic INR    ED Discharge Orders    None       Antonietta Breach, PA-C 07/25/18 Dawna Part, PA-C 07/25/18 0037    Fatima Blank, MD 07/25/18 [phone]

## 2018-07-24 NOTE — ED Notes (Signed)
RN notified provider of INR, Providers bedside explaining treatments including benefits and risks.  Pt verbalized understanding and agreed.

## 2018-07-24 NOTE — ED Triage Notes (Signed)
Pt arrives pov with reports of blood in stool since yesterday. Reports feeling tired since last night. Endorses SOB and L arm pain onset today.

## 2018-07-25 ENCOUNTER — Encounter: Payer: Self-pay | Admitting: Oncology

## 2018-07-25 ENCOUNTER — Encounter: Payer: Self-pay | Admitting: Family Medicine

## 2018-07-25 ENCOUNTER — Other Ambulatory Visit: Payer: Self-pay | Admitting: Family Medicine

## 2018-07-25 ENCOUNTER — Other Ambulatory Visit: Payer: Self-pay

## 2018-07-25 ENCOUNTER — Encounter (HOSPITAL_COMMUNITY): Payer: Self-pay | Admitting: Physician Assistant

## 2018-07-25 ENCOUNTER — Telehealth: Payer: Self-pay | Admitting: Internal Medicine

## 2018-07-25 DIAGNOSIS — K922 Gastrointestinal hemorrhage, unspecified: Secondary | ICD-10-CM | POA: Insufficient documentation

## 2018-07-25 DIAGNOSIS — K5731 Diverticulosis of large intestine without perforation or abscess with bleeding: Secondary | ICD-10-CM

## 2018-07-25 DIAGNOSIS — R791 Abnormal coagulation profile: Secondary | ICD-10-CM | POA: Diagnosis present

## 2018-07-25 DIAGNOSIS — D62 Acute posthemorrhagic anemia: Secondary | ICD-10-CM

## 2018-07-25 DIAGNOSIS — N179 Acute kidney failure, unspecified: Secondary | ICD-10-CM

## 2018-07-25 DIAGNOSIS — N189 Chronic kidney disease, unspecified: Secondary | ICD-10-CM

## 2018-07-25 LAB — CBC
HCT: 26.1 % — ABNORMAL LOW (ref 36.0–46.0)
Hemoglobin: 9 g/dL — ABNORMAL LOW (ref 12.0–15.0)
MCH: 28.5 pg (ref 26.0–34.0)
MCHC: 34.5 g/dL (ref 30.0–36.0)
MCV: 82.6 fL (ref 80.0–100.0)
Platelets: 142 10*3/uL — ABNORMAL LOW (ref 150–400)
RBC: 3.16 MIL/uL — AB (ref 3.87–5.11)
RDW: 19.3 % — ABNORMAL HIGH (ref 11.5–15.5)
WBC: 13 10*3/uL — ABNORMAL HIGH (ref 4.0–10.5)
nRBC: 0.2 % (ref 0.0–0.2)

## 2018-07-25 LAB — GLUCOSE, CAPILLARY
GLUCOSE-CAPILLARY: 84 mg/dL (ref 70–99)
Glucose-Capillary: 136 mg/dL — ABNORMAL HIGH (ref 70–99)
Glucose-Capillary: 167 mg/dL — ABNORMAL HIGH (ref 70–99)
Glucose-Capillary: 61 mg/dL — ABNORMAL LOW (ref 70–99)
Glucose-Capillary: 95 mg/dL (ref 70–99)
Glucose-Capillary: 110 mg/dL — ABNORMAL HIGH (ref 70–99)

## 2018-07-25 LAB — PROTIME-INR
INR: 1.41
Prothrombin Time: 17.1 seconds — ABNORMAL HIGH (ref 11.4–15.2)

## 2018-07-25 LAB — BASIC METABOLIC PANEL
Anion gap: 14 (ref 5–15)
BUN: 95 mg/dL — ABNORMAL HIGH (ref 8–23)
CO2: 19 mmol/L — ABNORMAL LOW (ref 22–32)
CREATININE: 3.36 mg/dL — AB (ref 0.44–1.00)
Calcium: 10.4 mg/dL — ABNORMAL HIGH (ref 8.9–10.3)
Chloride: 103 mmol/L (ref 98–111)
GFR calc Af Amer: 14 mL/min — ABNORMAL LOW (ref >60)
GFR calc non Af Amer: 12 mL/min — ABNORMAL LOW (ref >60)
Glucose, Bld: 77 mg/dL (ref 70–99)
Potassium: 3.6 mmol/L (ref 3.5–5.1)
Sodium: 136 mmol/L (ref 135–145)

## 2018-07-25 LAB — PREPARE RBC (CROSSMATCH)

## 2018-07-25 MED ORDER — INSULIN ASPART 100 UNIT/ML ~~LOC~~ SOLN
0.0000 [IU] | SUBCUTANEOUS | Status: DC
  Administered 2018-07-25: 2 [IU] via SUBCUTANEOUS
  Administered 2018-07-25: 1 [IU] via SUBCUTANEOUS
  Administered 2018-07-25: 2 [IU] via SUBCUTANEOUS

## 2018-07-25 MED ORDER — ACETAMINOPHEN 325 MG PO TABS: 650.0000 mg | ORAL_TABLET | Freq: Four times a day (QID) | ORAL | Status: DC | PRN

## 2018-07-25 MED ORDER — CALCITRIOL 0.25 MCG PO CAPS
0.2500 ug | ORAL_CAPSULE | Freq: Every day | ORAL | Status: DC
  Administered 2018-07-25 – 2018-07-26 (×2): 0.25 ug via ORAL
  Filled 2018-07-25 (×2): qty 1

## 2018-07-25 MED ORDER — SODIUM CHLORIDE 0.9% FLUSH
3.0000 mL | Freq: Two times a day (BID) | INTRAVENOUS | Status: DC
  Administered 2018-07-25 – 2018-07-26 (×4): 3 mL via INTRAVENOUS

## 2018-07-25 MED ORDER — PANTOPRAZOLE SODIUM 40 MG PO TBEC
40.0000 mg | DELAYED_RELEASE_TABLET | Freq: Every day | ORAL | Status: DC
  Administered 2018-07-25 – 2018-07-26 (×2): 40 mg via ORAL
  Filled 2018-07-25 (×2): qty 1

## 2018-07-25 MED ORDER — ATORVASTATIN CALCIUM 40 MG PO TABS
40.0000 mg | ORAL_TABLET | Freq: Every day | ORAL | Status: DC
  Administered 2018-07-25: 40 mg via ORAL
  Filled 2018-07-25: qty 1

## 2018-07-25 MED ORDER — INSULIN DETEMIR 100 UNIT/ML ~~LOC~~ SOLN
6.0000 [IU] | Freq: Every day | SUBCUTANEOUS | Status: DC
  Administered 2018-07-25: 6 [IU] via SUBCUTANEOUS
  Filled 2018-07-25 (×5): qty 0.06

## 2018-07-25 MED ORDER — ACETAMINOPHEN 650 MG RE SUPP: 650.0000 mg | Freq: Four times a day (QID) | RECTAL | Status: DC | PRN

## 2018-07-25 MED ORDER — ANASTROZOLE 1 MG PO TABS
1.0000 mg | ORAL_TABLET | Freq: Every day | ORAL | Status: DC
  Administered 2018-07-25 – 2018-07-26 (×2): 1 mg via ORAL
  Filled 2018-07-25 (×2): qty 1

## 2018-07-25 MED ORDER — CALCIUM CARBONATE ANTACID 500 MG PO CHEW
1.0000 | CHEWABLE_TABLET | ORAL | Status: DC
  Administered 2018-07-26: 200 mg via ORAL
  Filled 2018-07-25: qty 1

## 2018-07-25 NOTE — H&P (Signed)
History and Physical    SANTIANA GLIDDEN LEX:[id number] DOB: Jun 30, 1940 DOA: 07/24/2018  PCP: Eulas Post, MD  Patient coming from: Home  I have personally briefly reviewed patient's old medical records in [place]  Chief Complaint: Blood in stool  HPI: Yolanda Huffman is a 78 y.o. female with medical history significant for CAD s/p PCI, Afib on Coumadin, Combined systolic and diastolic CHF, PPM, SWH6PR, CKD Stage IV, IDA and Anemia of chronic disease, PUD, Severe diverticulosis, HTN, HLD who presents to the ED after a bloody bowel movement.    Patient states that she has felt fatigued for the last 3 days.  She was seen by her gastroenterologist on 07/23/2018 at which time initial plans to schedule a capsule endoscopy were discussed.    On 07/24/2018 patient felt more fatigued and began to have several episodes of diarrhea and noticed dark red blood in the stool.  She has some associated lightheadedness and dyspnea without syncope.  She denies any chest pain, palpitations, abdominal pain, or other obvious bleeding including epistaxis, hemoptysis, hematemesis, or hematuria.  She denies any melena.  She is on chronic anticoagulation with Coumadin has been taking regularly with last dose on 07/24/2018.  She had an EGD on 07/03/2018 which showed candidal esophagitis.  For which she completed treatment with oral fluconazole.  She last had a colonoscopy on 07/30/2015 which showed severe diverticulosis throughout the entire colon.  ED Course:  Initial vitals showed BP 133/67, pulse 75, RR 16, temp 98.0 Fahrenheit, SPO2 100% on room air.  Labs are notable for hemoglobin 7.3 (12 on 07/18/2018), hematocrit 22.4, PT >90, INR > 10 BUN 99, creatinine 3.65 (BUN 75, creatinine 3.18 on 07/18/2018), FOBT positive.  Patient was started on IV Protonix and on-call [facility] were consulted and recommended transfusion and reversal of coags.  Patient was ordered KCentra, IV vitamin K, an 2 units PRBCs  transfusion.  Hospitalist service was consulted to admit for further management.  Review of Systems: As per HPI otherwise 10 point review of systems negative.    Past Medical History:  Diagnosis Date  . Anemia   . Anemia   . Arthritis    gout  . Atrial fibrillation ([place])    prior Sotalol - d/c'd 2/2 increased creatinine; rate control strategy  . CAD (coronary artery disease)    s/p PCI in [place] in 1/09  . Cancer [facility])    left breast  . Chronic combined systolic and diastolic heart failure (HCC)    Echocardiogram 3/12: Mild LVH, EF 91-63%, normal diastolic function, mild AI, mild MR, PASP 44, normal wall motion  . CKD (chronic kidney disease)    sees Dr. Burman Foster - Stage 3 ?  Marland Kitchen Diabetes mellitus   . Hyperlipemia   . Hypertension   . MI, old    2009  . Osteoporosis   . Pacemaker   . Pleural effusion   . Pneumonia   . PUD (peptic ulcer disease) 10/2010   duodenal ulcer    Past Surgical History:  Procedure Laterality Date  . BREAST BIOPSY Left 07/29/2016  . BREAST LUMPECTOMY Left 09/19/2016  . BREAST LUMPECTOMY WITH RADIOACTIVE SEED LOCALIZATION Left 09/19/2016   Procedure: LEFT BREAST LUMPECTOMY WITH RADIOACTIVE SEED LOCALIZATION;  Surgeon: Autumn Messing III, MD;  Location: [facility];  Service: General;  Laterality: Left;  . CATARACT EXTRACTION Bilateral    01/12/09 and 01/26/09 both eyes  . COLONOSCOPY    . CORONARY ANGIOPLASTY  2009  . PACEMAKER  INSERTION  07/13/07  . stent implant  07/13/2007  . TOTAL HIP ARTHROPLASTY Right 07/23/2017   Procedure: TOTAL HIP ARTHROPLASTY ANTERIOR APPROACH;  Surgeon: Mcarthur Rossetti, MD;  Location: [place];  Service: Orthopedics;  Laterality: Right;     reports that she quit smoking about 26 years ago. Her smoking use included cigarettes. She has a 40.00 pack-year smoking history. She has never used smokeless tobacco. She reports that she does not drink alcohol or use drugs.  Allergies  Allergen Reactions  . Amlodipine Besylate Swelling     edema    Family History  Problem Relation Age of Onset  . Heart disease Father   . Heart attack Father   . Clotting disorder Father        blood clot  . Hypertension Father   . Heart disease Mother   . Breast cancer Maternal Aunt 27  . Breast cancer Sister 65  . Breast cancer Other 40  . Colon cancer Neg Hx      Prior to Admission medications   Medication Sig Start Date End Date Taking? Authorizing Provider  anastrozole (ARIMIDEX) 1 MG tablet TAKE 1 TABLET BY MOUTH DAILY Patient taking differently: Take 1 mg by mouth daily.  12/18/17  Yes Magrinat, Virgie Dad, MD  atorvastatin (LIPITOR) 40 MG tablet TAKE ONE TABLET BY MOUTH DAILY Patient taking differently: Take 40 mg by mouth daily at 6 PM.  05/04/18  Yes Burchette, Alinda Sierras, MD  calcitRIOL (ROCALTROL) 0.25 MCG capsule Take 0.25 mcg by mouth daily.  10/13/14  Yes [provider]  calcium carbonate (TUMS - DOSED IN MG ELEMENTAL CALCIUM) 500 MG chewable tablet Chew 1 tablet by mouth See admin instructions. Tuesday, Thursday, Saturday and Sunday   Yes [provider]  Cranberry 500 MG CAPS Take 500 mg by mouth daily with lunch.   Yes [provider]  denosumab (PROLIA) 60 MG/ML SOSY injection Inject 60 mg into the skin every 6 (six) months.   Yes [provider]  furosemide (LASIX) 40 MG tablet TAKE TWO TABLETS BY MOUTH EVERY MORNING AND TAKE ONE TABLET BY MOUTH EVERY EVENING Patient taking differently: Take 20-40 mg by mouth See admin instructions. Take 2 tablets every morning and take 1 tablet every evening 07/17/18  Yes Burchette, Alinda Sierras, MD  Insulin Detemir (LEVEMIR FLEXTOUCH) 100 UNIT/ML Pen Inject 12 Units into the skin daily. 01/29/18  Yes Burchette, Alinda Sierras, MD  metolazone (ZAROXOLYN) 2.5 MG tablet TAKE 1 TABLET BY MOUTH EVERY MONDAY, WEDNESDAY, AND FRIDAY IN THE MORNING Patient taking differently: Take 2.5 mg by mouth every Monday, Wednesday, and Friday.  06/05/18  Yes Burchette, Alinda Sierras, MD    metoprolol succinate (TOPROL-XL) 25 MG 24 hr tablet TAKE TWO TABLET BY MOUTH EVERY MORNING AND THEN ONE TABLET BY MOUTH EVERY EVENING Patient taking differently: Take 25-50 mg by mouth See admin instructions. TAKE TWO TABLET BY MOUTH EVERY MORNING AND THEN ONE TABLET BY MOUTH EVERY EVENING 03/30/18  Yes Burchette, Alinda Sierras, MD  Multiple Vitamin (MULTIVITAMIN WITH MINERALS) TABS tablet Take 1 tablet by mouth daily. FOR ADULT 50+   Yes [provider]  Omega-3 Fatty Acids (FISH OIL) 1000 MG CAPS Take 1,000 mg by mouth 2 (two) times daily.   Yes [provider]  pantoprazole (PROTONIX) 40 MG tablet TAKE ONE TABLET BY MOUTH DAILY Patient taking differently: Take 40 mg by mouth daily.  04/13/18  Yes Burchette, Alinda Sierras, MD  potassium chloride SA (K-DUR,KLOR-CON) 20 MEQ tablet  TAKE 2 TABLET BY MOUTH TWO TIMES A DAY WITH FOOD AND WATER. Patient taking differently: Take 40 mEq by mouth 2 (two) times daily.  03/28/18  Yes Evans Lance, MD  warfarin (COUMADIN) 5 MG tablet Take 1/2 tablet daily except 1 on sun Tues Thurs or TAKE AS [place] Patient taking differently: 2.5 mg on Mon, Wed, Fri and Sat 5 mg on Tues Thurs Sun 03/15/18  Yes Burchette, Alinda Sierras, MD  feeding supplement, GLUCERNA SHAKE, (GLUCERNA SHAKE) LIQD Take 237 mLs by mouth 2 (two) times daily between meals. 07/25/17   Debbe Odea, MD  glucose blood test strip 1 each by Other route 3 (three) times daily. Use as instructed     [provider]    Physical Exam: Vitals:   07/25/18 0030 07/25/18 0045 07/25/18 0112 07/25/18 0130  BP: (!) 117/48 (!) 126/52 (!) 126/52 (!) 123/49  Pulse: 70 70 71 70  Resp: 16 13 16 13   Temp:   98 F (36.7 C) 98.2 F (36.8 C)  TempSrc:   Oral Oral  SpO2: 100% 100%  100%  Weight:      Height:        Constitutional: Elderly woman resting supine in bed, NAD, calm, comfortable, pale appearance Eyes: PERRL, lids and conjunctivae normal ENMT: Mucous membranes  are moist. Posterior pharynx clear of any exudate or lesions. Normal dentition.  Neck: normal, supple, no masses. Respiratory: clear to auscultation bilaterally, no wheezing, no crackles. Normal respiratory effort. No accessory muscle use.  Cardiovascular: Regular rate and rhythm, soft systolic murmur. No extremity edema. Abdomen: no tenderness, no masses palpated. No hepatosplenomegaly. Bowel sounds positive.  Musculoskeletal: no clubbing / cyanosis. No joint deformity upper and lower extremities. Good ROM, no contractures. Normal muscle tone.  Skin: no rashes, lesions, ulcers. No induration Neurologic: CN 2-12 grossly intact. Sensation intact, Strength 5/5 in all 4.  Psychiatric: Normal judgment and insight. Alert and oriented x 3. Normal mood.     Labs on Admission: I have personally reviewed following labs and imaging studies  CBC: Recent Labs  Lab 07/18/18 1001 07/24/18 2213  WBC 7.1 15.4*  NEUTROABS 5.0 13.7*  HGB 12.1 7.3*  HCT 36.5 22.4*  MCV 86.5 89.2  PLT 181 440   Basic Metabolic Panel: Recent Labs  Lab 07/18/18 1001 07/24/18 2213  NA 133* 133*  K 5.1 4.6  CL 97* 101  CO2 24 17*  GLUCOSE 149* 230*  BUN 75* 99*  CREATININE 3.18* 3.65*  CALCIUM 11.1* 10.8*   GFR: Estimated Creatinine Clearance: 10.7 mL/min (A) (by C-G formula based on SCr of 3.65 mg/dL (H)). Liver Function Tests: Recent Labs  Lab 07/18/18 1001 07/24/18 2213  AST 18 24  ALT 15 15  ALKPHOS 43 27*  BILITOT 0.4 0.6  PROT 7.8 6.2*  ALBUMIN 3.7 3.2*   No results for input(s): LIPASE, AMYLASE in the last 168 hours. No results for input(s): AMMONIA in the last 168 hours. Coagulation Profile: Recent Labs  Lab 07/24/18 2213  INR >10.00*   Cardiac Enzymes: No results for input(s): CKTOTAL, CKMB, CKMBINDEX, TROPONINI in the last 168 hours. BNP (last 3 results) No results for input(s): PROBNP in the last 8760 hours. HbA1C: No results for input(s): HGBA1C in the last 72 hours. CBG: No  results for input(s): GLUCAP in the last 168 hours. Lipid Profile: No results for input(s): CHOL, HDL, LDLCALC, TRIG, CHOLHDL, LDLDIRECT in the last 72 hours. Thyroid Function Tests: No results for input(s): TSH,  T4TOTAL, FREET4, T3FREE, THYROIDAB in the last 72 hours. Anemia Panel: No results for input(s): VITAMINB12, FOLATE, FERRITIN, TIBC, IRON, RETICCTPCT in the last 72 hours. Urine analysis:    Component Value Date/Time   COLORURINE LT. YELLOW 07/01/2011 [place] 07/01/2011 1203   LABSPEC 1.010 07/01/2011 1203   PHURINE 6.0 07/01/2011 1203   GLUCOSEU NEGATIVE 07/01/2011 1203   HGBUR TRACE-LYSED 07/01/2011 1203   BILIRUBINUR neg 05/02/2013 1215   KETONESUR NEGATIVE 07/01/2011 1203   PROTEINUR 1 05/02/2013 1215   PROTEINUR NEGATIVE 09/15/2010 0449   UROBILINOGEN 0.2 05/02/2013 1215   UROBILINOGEN 0.2 07/01/2011 1203   NITRITE neg 05/02/2013 1215   NITRITE NEGATIVE 07/01/2011 1203   LEUKOCYTESUR moderate (2+) 05/02/2013 1215    Radiological Exams on Admission: No results found.  EKG: Independently reviewed. Atrial fibrillation, rate 72 bpm, nonspecific IVCD, TWI leads I, aVL.  Prior EKG showed paced rhythm.  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 female with medical history significant for CAD s/p PCI, Afib on Coumadin, Combined systolic and diastolic CHF, PPM, VVO1YW, CKD Stage IV, IDA and Anemia of chronic disease, PUD, Severe diverticulosis, HTN, HLD who is admitted for symptomatic acute blood loss anemia in the setting of chronic Coumadin use with supratherapeutic INR.  Symptomatic acute blood loss anemia: Suspect  lower GI bleed due to known severe diverticulosis versus small bowel bleeding.  Patient without 5 gram drop in Hgb from 12 to 7 in six days.  She is currently hemodynamically stable. -Transfusing 2 units PRBCs -Posttransfusion CBC, transfuse as needed for goal of 8 -GI consulted, to see in a.m. -Continue IV Protonix for now -Holding Coumadin -NPO with ice chips for now  Elevated PT, supratherapeutic INR: Receiving Kcentra and IV vitamin K per rapid reversal protocol. -Repeat INR after K Centra then follow per protocol  Acute on chronic stage IV kidney injury: GFR slightly decreased from baseline, likely secondary to acute blood loss anemia.  Anticipate stabilization and return to baseline with blood transfusion.  Continue to monitor and holding Lasix/metolazone as above.  Atrial fibrillation: In atrial fibrillation with rate controlled on admission.  Holding Coumadin and metoprolol.  CAD: She denies any active chest pain.  Holding metoprolol.  Continue atorvastatin.  Chronic combined systolic and diastolic CHF: EF 73-71% by TTE 03/2018.  Appears hypovolemic on exam. -Holding home Lasix, metolazone, metoprolol with acute blood volume loss  Insulin-dependent type 2 diabetes: On Levemir 12 units daily at home. -Reduced dose Levemir 6 units plus sensitive SSI q4h for now while n.p.o.  Hypertension: Normotensive on admission.  Holding home metoprolol and diuretics.  Hyperlipidemia: Continue atorvastatin.   DVT prophylaxis: SCDs Code Status: Full code, confirmed with patient Family Communication: None present at bedside on admission Disposition Plan: Pending further GI work-up, reversal of supratherapeutic INR and management of acute blood loss anemia Consults called: [facility] consulted by EDP, plan to see in a.m. Admission status: Observation   Zada Finders MD [facility] Pager [phone]  If 7PM-7AM, please contact night-coverage [email or web address]  07/25/2018, 1:36  AM

## 2018-07-25 NOTE — Consult Note (Addendum)
[facility] Consult: 8:39 AM 07/25/2018  LOS: 0 days    Referring Provider: Dr Fuller Plan  Primary Care Physician:  Eulas Post, MD Primary Gastroenterologist:  Dr. Silverio Decamp    Reason for Consultation:  GI bleed, anemia   HPI: Yolanda Huffman is a 78 y.o. female.  PMH IDA (Hgb 7.1) requiring PRBCs, parenteral iron.  Duodenal ulcers 2012.  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  IDDM.   A fib on Coumadin.  CHF.  03/2018 echocardiogram with LVEF 45-50%, multi valvular heart disease involving aortic, tricuspid and mitral valve.  S/p pacemaker.  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    2011 Colonoscopy: HP polyp.  05/2015 Colonoscopy.  Procedure aborted due to presence of solid stool in colon.    07/2015 Colonoscopy.  Severe diverticulosis.    07/03/2018 EGD: patchy esophagitis (no barrets), esophageal candidiasis.  Completed 10 d Diflucan ~ 1/19.    Seen at GI 1/27.  PA planned capsule endoscopy in March to complete IDA work up.  Fatigued for last few days.  In the evening following the office visit 1/27 she helped diarrhea and small little bit of blood in the stool.  Overnight she had more loose stool and had 4 or 5 the following day, Tuesday/yesterday.  These were dark with clotted blood.  No fresh blood.  She developed DOE, dizziness but no syncope.  She never had any abdominal pain or nausea.    INR >10.  PT >90.  This morning INR is 0.4 Hgb 7.3 last night was 12.1 on 1/22.  No repeat hemoglobins yet today.  MCV 89.  BUN/creat 99/3.6.    Transfused 2 PRBCs.   S/p 10 mg IV Vit K.  S/p K centra.   Protonix gtt in place Last episode of dark/bloody stool was 9 or 10 PM.  Other than the Diflucan, no changes in meds or new meds.  No NSAIDs.  No other abnormal or unusual bleeding/bruising.  Past Medical  History:  Diagnosis Date  . Anemia   . Anemia   . Arthritis    gout  . Atrial fibrillation ([place])    prior Sotalol - d/c'd 2/2 increased creatinine; rate control strategy  . CAD (coronary artery disease)    s/p PCI in [place] in 1/09  . Cancer [facility])    left breast  . Chronic combined systolic and diastolic heart failure (HCC)    Echocardiogram 3/12: Mild LVH, EF 78-93%, normal diastolic function, mild AI, mild MR, PASP 44, normal wall motion  . CKD (chronic kidney disease)    sees Dr. Burman Foster - Stage 3 ?  Marland Kitchen Diabetes mellitus   . Hyperlipemia   . Hypertension   . MI, old    2009  . Osteoporosis   . Pacemaker   . Pleural effusion   . Pneumonia   . PUD (peptic ulcer disease) 10/2010   duodenal ulcer    Past Surgical History:  Procedure Laterality Date  . BREAST BIOPSY Left 07/29/2016  . BREAST LUMPECTOMY Left 09/19/2016  . BREAST LUMPECTOMY WITH RADIOACTIVE  SEED LOCALIZATION Left 09/19/2016   Procedure: LEFT BREAST LUMPECTOMY WITH RADIOACTIVE SEED LOCALIZATION;  Surgeon: Autumn Messing III, MD;  Location: [place];  Service: General;  Laterality: Left;  . CATARACT EXTRACTION Bilateral    01/12/09 and 01/26/09 both eyes  . COLONOSCOPY    . CORONARY ANGIOPLASTY  2009  . PACEMAKER INSERTION  07/13/07  . stent implant  07/13/2007  . TOTAL HIP ARTHROPLASTY Right 07/23/2017   Procedure: TOTAL HIP ARTHROPLASTY ANTERIOR APPROACH;  Surgeon: Mcarthur Rossetti, MD;  Location: [place];  Service: Orthopedics;  Laterality: Right;    Prior to Admission medications   Medication Sig Start Date End Date Taking? Authorizing Provider  anastrozole (ARIMIDEX) 1 MG tablet TAKE 1 TABLET BY MOUTH DAILY Patient taking differently: Take 1 mg by mouth daily.  12/18/17  Yes Magrinat, Virgie Dad, MD  atorvastatin (LIPITOR) 40 MG tablet TAKE ONE TABLET BY MOUTH DAILY Patient taking differently: Take 40 mg by mouth daily at 6 PM.  05/04/18  Yes Burchette, Alinda Sierras, MD  calcitRIOL (ROCALTROL) 0.25 MCG capsule Take  0.25 mcg by mouth daily.  10/13/14  Yes [provider]  calcium carbonate (TUMS - DOSED IN MG ELEMENTAL CALCIUM) 500 MG chewable tablet Chew 1 tablet by mouth See admin instructions. Tuesday, Thursday, Saturday and Sunday   Yes [provider]  Cranberry 500 MG CAPS Take 500 mg by mouth daily with lunch.   Yes [provider]  denosumab (PROLIA) 60 MG/ML SOSY injection Inject 60 mg into the skin every 6 (six) months.   Yes [provider]  furosemide (LASIX) 40 MG tablet TAKE TWO TABLETS BY MOUTH EVERY MORNING AND TAKE ONE TABLET BY MOUTH EVERY EVENING Patient taking differently: Take 20-40 mg by mouth See admin instructions. Take 2 tablets every morning and take 1 tablet every evening 07/17/18  Yes Burchette, Alinda Sierras, MD  Insulin Detemir (LEVEMIR FLEXTOUCH) 100 UNIT/ML Pen Inject 12 Units into the skin daily. 01/29/18  Yes Burchette, Alinda Sierras, MD  metolazone (ZAROXOLYN) 2.5 MG tablet TAKE 1 TABLET BY MOUTH EVERY MONDAY, WEDNESDAY, AND FRIDAY IN THE MORNING Patient taking differently: Take 2.5 mg by mouth every Monday, Wednesday, and Friday.  06/05/18  Yes Burchette, Alinda Sierras, MD  Multiple Vitamin (MULTIVITAMIN WITH MINERALS) TABS tablet Take 1 tablet by mouth daily. FOR ADULT 50+   Yes [provider]  Omega-3 Fatty Acids (FISH OIL) 1000 MG CAPS Take 1,000 mg by mouth 2 (two) times daily.   Yes [provider]  pantoprazole (PROTONIX) 40 MG tablet TAKE ONE TABLET BY MOUTH DAILY Patient taking differently: Take 40 mg by mouth daily.  04/13/18  Yes Burchette, Alinda Sierras, MD  potassium chloride SA (K-DUR,KLOR-CON) 20 MEQ tablet TAKE 2 TABLET BY MOUTH TWO TIMES A DAY WITH FOOD AND WATER. Patient taking differently: Take 40 mEq by mouth 2 (two) times daily.  03/28/18  Yes Evans Lance, MD  warfarin (COUMADIN) 5 MG tablet Take 1/2 tablet daily except 1 on sun Tues Thurs or TAKE AS [place] Patient taking differently: 2.5 mg on Mon,  Wed, Fri and Sat 5 mg on Tues Thurs Sun 03/15/18  Yes Burchette, Alinda Sierras, MD  feeding supplement, GLUCERNA SHAKE, (GLUCERNA SHAKE) LIQD Take 237 mLs by mouth 2 (two) times daily between meals. 07/25/17   Debbe Odea, MD  glucose blood test strip 1 each by Other route 3 (three) times daily. Use as instructed     [provider]  metoprolol succinate (  TOPROL-XL) 25 MG 24 hr tablet Take 1-2 tablets (25-50 mg total) by mouth See admin instructions. TAKE TWO TABLET BY MOUTH EVERY MORNING AND THEN ONE TABLET BY MOUTH EVERY EVENING 07/25/18   Burchette, Alinda Sierras, MD    Scheduled Meds: . anastrozole  1 mg Oral Daily  . atorvastatin  40 mg Oral q1800  . calcitRIOL  0.25 mcg Oral Daily  . insulin aspart  0-9 Units Subcutaneous Q4H  . insulin detemir  6 Units Subcutaneous QHS  . sodium chloride flush  3 mL Intravenous Q12H   Infusions: . pantoprozole (PROTONIX) infusion 8 mg/hr (07/25/18 0726)   PRN Meds: acetaminophen **OR** acetaminophen   Allergies as of 07/24/2018 - Review Complete 07/24/2018  Allergen Reaction Noted  . Amlodipine besylate Swelling 10/10/2014    Family History  Problem Relation Age of Onset  . Heart disease Father   . Heart attack Father   . Clotting disorder Father        blood clot  . Hypertension Father   . Heart disease Mother   . Breast cancer Maternal Aunt 56  . Breast cancer Sister 50  . Breast cancer Other 40  . Colon cancer Neg Hx     Social History   Socioeconomic History  . Marital status: Widowed    Spouse name: Not on file  . Number of children: 1  . Years of education: Not on file  . Highest education level: Not on file  Occupational History  . Occupation: retired Theatre stage manager: RETIRED  Social Needs  . Financial resource strain: Not on file  . Food insecurity:    Worry: Not on file    Inability: Not on file  . Transportation needs:    Medical: Not on file    Non-medical: Not on file  Tobacco Use  . Smoking status:  Former Smoker    Packs/day: 2.00    Years: 20.00    Pack years: 40.00    Types: Cigarettes    Last attempt to quit: 11/26/1991    Years since quitting: 26.6  . Smokeless tobacco: Never Used  Substance and Sexual Activity  . Alcohol use: No  . Drug use: No  . Sexual activity: Not on file  Lifestyle  . Physical activity:    Days per week: Not on file    Minutes per session: Not on file  . Stress: Not on file  Relationships  . Social connections:    Talks on phone: Not on file    Gets together: Not on file    Attends religious service: Not on file    Active member of club or organization: Not on file    Attends meetings of clubs or organizations: Not on file    Relationship status: Not on file  . Intimate partner violence:    Fear of current or ex partner: Not on file    Emotionally abused: Not on file    Physically abused: Not on file    Forced sexual activity: Not on file  Other Topics Concern  . Not on file  Social History Narrative   Reg exercise          REVIEW OF SYSTEMS: Constitutional: Weakness as per HPI.  Normally fairly good energy level. ENT:  No nose bleeds Pulm: DOE yesterday, this is resolved. CV:  No palpitations, no LE edema.  GU:  No hematuria, no frequency GI:  Per HPI Heme:  Per HPI.     Transfusions:  2 PRBCs in 03/2018 Neuro:  No headaches, no peripheral tingling or numbness Derm:  No itching, no rash or sores.  Endocrine:  No sweats or chills.  No polyuria or dysuria Immunization: Reviewed.  Up-to-date on multiple vaccinations including her flu shot. Travel:  None beyond local counties in last few months.    PHYSICAL EXAM: Vital signs in last 24 hours: Vitals:   07/25/18 0450 07/25/18 0515  BP: (!) 130/53 (!) 138/54  Pulse: 70 71  Resp: 18 18  Temp: 98.1 F (36.7 C) 98 F (36.7 C)  SpO2: 98% 99%   Wt Readings from Last 3 Encounters:  07/25/18 65.9 kg  07/23/18 65.8 kg  07/18/18 67.1 kg    General: Pleasant, alert, comfortable.   Non-ill-appearing elderly WF. Head: No facial asymmetry or swelling.  No signs of head trauma Eyes: No scleral icterus.  No conjunctival pallor.  EOMI. Ears: Not HOH. Nose: No discharge or congestion. Mouth: Tongue midline.  Oral mucosa moist, pink, clear.  Full dentures in place. Neck: JVD, no masses, no thyromegaly. Lungs: Clear bilaterally.  No labored breathing or cough. Heart: Paced, even rhythm in the 56L.8-9/3 early systolic murmur. Abdomen: Soft.  Not tender, not distended.  Umbilical hernia.  The umbilicus looks surgically altered though the patient denies previous umbilical surgery.  No HSM, masses, bruits.  Bowel sounds hypoactive but normal quality. Rectal: DRE performed by PA in the emergency room yesterday revealed hematochezia. Musc/Skeltl: Arthritic deformities in the fingers.  Varus deformity of the second right toe. Extremities: No CCE. Neurologic: Oriented x3.  Excellent, highly detailed historian.  Moves all 4 limbs, strength not tested.  No tremors. Skin: No rash, no sores, no suspicious lesions. Tattoos: None Nodes: No cervical adenopathy. Psych: Pleasant.  Cooperative.  Fluid speech.  Calm.  Intake/Output from previous day: 01/28 0701 - 01/29 0700 In: 692.6 [I.V.:116.9; Blood:315; IV Piggyback:260.6] Out: -  Intake/Output this shift: Total I/O In: 223.5 [I.V.:223.5] Out: -   LAB RESULTS: Recent Labs    07/24/18 2213  WBC 15.4*  HGB 7.3*  HCT 22.4*  PLT 209   BMET Lab Results  Component Value Date   NA 133 (L) 07/24/2018   NA 133 (L) 07/18/2018   NA 135 (A) 04/26/2018   K 4.6 07/24/2018   K 5.1 07/18/2018   K 4.0 04/26/2018   CL 101 07/24/2018   CL 97 (L) 07/18/2018   CL 104 04/19/2018   CO2 17 (L) 07/24/2018   CO2 24 07/18/2018   CO2 26 04/19/2018   GLUCOSE 230 (H) 07/24/2018   GLUCOSE 149 (H) 07/18/2018   GLUCOSE 50 (L) 04/19/2018   BUN 99 (H) 07/24/2018   BUN 75 (H) 07/18/2018   BUN 42 (A) 04/26/2018   CREATININE 3.65 (H) 07/24/2018     CREATININE 3.18 (HH) 07/18/2018   CREATININE 2.3 (A) 04/26/2018   CALCIUM 10.8 (H) 07/24/2018   CALCIUM 11.1 (H) 07/18/2018   CALCIUM 9.3 04/19/2018   LFT Recent Labs    07/24/18 2213  PROT 6.2*  ALBUMIN 3.2*  AST 24  ALT 15  ALKPHOS 27*  BILITOT 0.6   PT/INR Lab Results  Component Value Date   INR >10.00 (HH) 07/24/2018   INR 2.3 07/09/2018   INR 2.3 06/15/2018    RADIOLOGY STUDIES: No results found.   IMPRESSION:   *   Acute anemia in pt with hx IDA and anemia of CKD.   S/p PRBC x 2.  Patient is not receiving ESA (erythrocyte stimulating  agent), has not received parenteral iron since 04/2018.   Capsule endoscopy planned for 3/17.  *   GIB, patient describes dark stool, on DRE in the emergency room hematochezia noted. Known severe diverticulosis.  Suspect diverticular bleed.   Duodenal ulcers in 2012 on chronic PPI.  Patchy esophagitis and (now treated) esophageal candidiasis on EGD 3 weeks ago.    *   Iatrogenic coagulopathy.  INR >10 in pt on Coumadin for A fib.  Coumadin on hold.  S/p reversal with FFP, K centra.  No FFP.    ? did the Diflucan cause rise in the INR?  However she completed the Diflucan 8 days before developing GIB.    *   CKD stage 4-5.  Tandem elevation of BUN/creat this month c/w 03/2018.         PLAN:     *   Stop IV Protonix.  Restart daily oral Protonix No plans to repeat endoscopy or colonoscopy unless her bleeding continues in the setting of corrected INR. She should keep her appointment for capsule endoscopy set for March.  *    Feed patient, carb modified diet.   Azucena Freed  07/25/2018, 8:39 AM Phone [phone]

## 2018-07-25 NOTE — Progress Notes (Signed)
Admitted after midnight see full H&P for full details.  Patient was transfused 2 units of packed red blood cells.  Repeat hemoglobin improved from 7.3 to 9.  Evaluated by gastroenterology who recommended monitoring since correction of supratherapeutic INR.  Repeat INR this morning 1.41.  Recheck CBC in a.m.

## 2018-07-25 NOTE — Telephone Encounter (Signed)
Called by on call 07/25/18 Daughter states mom was having rectal bleeding bloody diarrhea with clots  She was advised to go to ED by on call RN but daughter would like direct admit Advised on call RN no way to direct admit w/o pt being evaluated  -rec go to ED [facility] or [facility]   [place]

## 2018-07-25 NOTE — Progress Notes (Signed)
2nd unit of blood started

## 2018-07-25 NOTE — Progress Notes (Signed)
Pt blood sugar at 61.  Lunch tray at bedside and patient given 4 oz ginger ale.

## 2018-07-26 ENCOUNTER — Encounter: Payer: Self-pay | Admitting: Family Medicine

## 2018-07-26 DIAGNOSIS — I129 Hypertensive chronic kidney disease with stage 1 through stage 4 chronic kidney disease, or unspecified chronic kidney disease: Secondary | ICD-10-CM

## 2018-07-26 DIAGNOSIS — R791 Abnormal coagulation profile: Secondary | ICD-10-CM | POA: Diagnosis not present

## 2018-07-26 DIAGNOSIS — I4821 Permanent atrial fibrillation: Secondary | ICD-10-CM | POA: Diagnosis not present

## 2018-07-26 DIAGNOSIS — I10 Essential (primary) hypertension: Secondary | ICD-10-CM

## 2018-07-26 DIAGNOSIS — E785 Hyperlipidemia, unspecified: Secondary | ICD-10-CM

## 2018-07-26 DIAGNOSIS — D62 Acute posthemorrhagic anemia: Secondary | ICD-10-CM | POA: Diagnosis not present

## 2018-07-26 DIAGNOSIS — I5042 Chronic combined systolic (congestive) and diastolic (congestive) heart failure: Secondary | ICD-10-CM

## 2018-07-26 DIAGNOSIS — K922 Gastrointestinal hemorrhage, unspecified: Secondary | ICD-10-CM | POA: Diagnosis not present

## 2018-07-26 DIAGNOSIS — E1122 Type 2 diabetes mellitus with diabetic chronic kidney disease: Secondary | ICD-10-CM

## 2018-07-26 DIAGNOSIS — N179 Acute kidney failure, unspecified: Secondary | ICD-10-CM | POA: Diagnosis not present

## 2018-07-26 DIAGNOSIS — N184 Chronic kidney disease, stage 4 (severe): Secondary | ICD-10-CM

## 2018-07-26 LAB — GLUCOSE, CAPILLARY
GLUCOSE-CAPILLARY: 78 mg/dL (ref 70–99)
Glucose-Capillary: 73 mg/dL (ref 70–99)
Glucose-Capillary: 71 mg/dL (ref 70–99)

## 2018-07-26 LAB — TYPE AND SCREEN
ABO/RH(D): A NEG
Antibody Screen: NEGATIVE
UNIT DIVISION: 0
Unit division: 0

## 2018-07-26 LAB — CBC
HCT: 29.1 % — ABNORMAL LOW (ref 36.0–46.0)
Hemoglobin: 9.8 g/dL — ABNORMAL LOW (ref 12.0–15.0)
MCH: 28.3 pg (ref 26.0–34.0)
MCHC: 33.7 g/dL (ref 30.0–36.0)
MCV: 84.1 fL (ref 80.0–100.0)
Platelets: 149 10*3/uL — ABNORMAL LOW (ref 150–400)
RBC: 3.46 MIL/uL — ABNORMAL LOW (ref 3.87–5.11)
RDW: 20.9 % — ABNORMAL HIGH (ref 11.5–15.5)
WBC: 9.3 10*3/uL (ref 4.0–10.5)
nRBC: 0.3 % — ABNORMAL HIGH (ref 0.0–0.2)

## 2018-07-26 LAB — BASIC METABOLIC PANEL
ANION GAP: 13 (ref 5–15)
BUN: 88 mg/dL — ABNORMAL HIGH (ref 8–23)
CO2: 21 mmol/L — ABNORMAL LOW (ref 22–32)
Calcium: 10.2 mg/dL (ref 8.9–10.3)
Chloride: 105 mmol/L (ref 98–111)
Creatinine, Ser: 3.73 mg/dL — ABNORMAL HIGH (ref 0.44–1.00)
GFR calc Af Amer: 13 mL/min — ABNORMAL LOW (ref >60)
GFR calc non Af Amer: 11 mL/min — ABNORMAL LOW (ref >60)
Glucose, Bld: 70 mg/dL (ref 70–99)
Potassium: 3 mmol/L — ABNORMAL LOW (ref 3.5–5.1)
Sodium: 139 mmol/L (ref 135–145)

## 2018-07-26 LAB — PROTIME-INR
INR: 1.09
Prothrombin Time: 14 seconds (ref 11.4–15.2)

## 2018-07-26 LAB — BPAM RBC
Blood Product Expiration Date: 202002032359
Blood Product Expiration Date: 202002032359
ISSUE DATE / TIME: 202001290046
ISSUE DATE / TIME: 202001290453
Unit Type and Rh: 600
Unit Type and Rh: 600

## 2018-07-26 MED ORDER — POTASSIUM CHLORIDE CRYS ER 20 MEQ PO TBCR
40.0000 meq | EXTENDED_RELEASE_TABLET | ORAL | Status: AC
  Administered 2018-07-26: 40 meq via ORAL
  Filled 2018-07-26: qty 2

## 2018-07-26 NOTE — Discharge Summary (Addendum)
Yolanda Huffman, is a 78 y.o. female  DOB Dec 03, 1940  MRN [id number].  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 Anemia   . Arthritis    gout  . Atrial fibrillation ([place])    prior Sotalol - d/c'd 2/2 increased creatinine; rate control strategy  . CAD (coronary artery disease)    s/p PCI in [place] in 1/09  . Cancer [facility])    left breast  . Chronic combined systolic and diastolic heart failure (HCC)    Echocardiogram 3/12: Mild LVH, EF 67-20%, normal diastolic function, mild AI, mild MR, PASP 44, normal wall motion  . CKD (chronic kidney disease)    sees Dr. Burman Foster - Stage 3 ?  Marland Kitchen Diabetes mellitus   . Hyperlipemia   . Hypertension   . MI, old    2009  . Osteoporosis   . Pacemaker   . Pleural effusion   . Pneumonia   . PUD (peptic ulcer disease) 10/2010   duodenal ulcer    Past Surgical History:  Procedure Laterality Date  . BREAST BIOPSY Left 07/29/2016  . BREAST LUMPECTOMY Left 09/19/2016  . BREAST LUMPECTOMY WITH RADIOACTIVE SEED LOCALIZATION Left 09/19/2016   Procedure: LEFT BREAST LUMPECTOMY WITH RADIOACTIVE SEED LOCALIZATION;  Surgeon: Autumn Messing III, MD;  Location: [facility];  Service: General;  Laterality: Left;  .  CATARACT EXTRACTION Bilateral    01/12/09 and 01/26/09 both eyes  . COLONOSCOPY    . CORONARY ANGIOPLASTY  2009  . PACEMAKER INSERTION  07/13/07  . stent implant  07/13/2007  . TOTAL HIP ARTHROPLASTY Right 07/23/2017   Procedure: TOTAL HIP ARTHROPLASTY ANTERIOR APPROACH;  Surgeon: Mcarthur Rossetti, MD;  Location: [facility];  Service: Orthopedics;  Laterality: Right;       HPI  from the history and physical done on the day of admission:  Yolanda Huffman is a 78 y.o. female with medical history significant for CAD s/p PCI, Afib on Coumadin, Combined systolic and diastolic CHF, PPM, NOB0JG, CKD Stage IV, IDA and Anemia of chronic disease, PUD, Severe diverticulosis, HTN, HLD who presents to the ED after a bloody bowel movement.    Patient states that she has felt fatigued for the last 3 days.  She was seen by her gastroenterologist on 07/23/2018 at which time initial plans to schedule a capsule endoscopy were discussed.    On 07/24/2018  patient felt more fatigued and began to have several episodes of diarrhea and noticed dark red blood in the stool.  She has some associated lightheadedness and dyspnea without syncope.  She denies any chest pain, palpitations, abdominal pain, or other obvious bleeding including epistaxis, hemoptysis, hematemesis, or hematuria.  She denies any melena.  She is on chronic anticoagulation with Coumadin has been taking regularly with last dose on 07/24/2018.  She had an EGD on 07/03/2018 which showed candidal esophagitis.  For which she completed treatment with oral fluconazole.  She last had a colonoscopy on 07/30/2015 which showed severe diverticulosis throughout the entire colon.  ED Course:  Initial vitals showed BP 133/67, pulse 75, RR 16, temp 98.0 Fahrenheit, SPO2 100% on room air.  Labs are notable for hemoglobin 7.3 (12 on 07/18/2018), hematocrit 22.4, PT >90, INR > 10 BUN 99, creatinine 3.65 (BUN 75, creatinine 3.18 on 07/18/2018), FOBT positive.  Patient was  started on IV Protonix and on-call [facility] were consulted and recommended transfusion and reversal of coags.  Patient was ordered KCentra, IV vitamin K, an 2 units PRBCs transfusion.  Hospitalist service was consulted to admit for further management.    Hospital Course:   Gastrointestinal bleeding with acute blood loss anemia: Acute.  Patient presents with reports of GI bleeding. INR noted to be elevated > 10.   Hemoglobin dropped from 12 down to 7.3 less than a week.  Patient transfused 2 units of packed red blood cells and initially placed on Protonix drip.  Patient had just undergone EGD on 07/03/2018 showing esophageal candidiasis for which she completed course of fluconazole.  Last colonoscopy was 07/2015 showing significant diverticulosis.  [facility] consulted, but recommended observation overnight.  Repeat hemoglobin prior to discharge 9.8.  Suspected diverticular bleed with gastroenterology recommended capsule endoscopy and March.  Supratherapeutic INR: Resolved.  Initially greater than 10.  Suspected secondary to recent fluconazole use. Patient received Kcentra and IV vitamin K.  INR corrected to 1.05 prior to discharge.  Acute on chronic stage IV kidney: Patient followed in outpatient by nephrology.  Previously creatinine around 2-3, but creatinine noted elevated up to 3.73.  Suspected to be multifactorial given acute blood loss and diuretics.  Recommended continue to hold diuretics until can follow-up with primary care provider within 1 week. Patient advised of the need of repeat BMP.  She will also need to follow-up with her nephrologist in the outpatient setting.  Atrial fibrillation, s/p PPM: Patient appears to be rate controlled. CHA2DS2-VASc score = at least equal to 6 CHF, HTN, DM, sex, and age). Patient followed by Dr. Lovena Le in outpatient setting.  Will need to follow-up with primary and/or cardiology to determine if appropriate to restart anticoagulation.   Chronic  systolic just of heart failure: Last EF noted to be 45 to 50% by TTE in 03/2018.    Recommended patient to monitor weight daily and to notify primary provider if weight increasing significantly prior to her follow-up appointment to have instruction on.  Insulin-dependent diabetes mellitus type 2.  Patient was placed on a reduced Levemir regimen with sliding scale insulin while hospitalized.  She was returned to her normal home regimen at discharge.  Essential hypertension: Stable.Medications initially held Lasix, metolazone and metoprolol.  Metoprolol was restarted prior to discharge.  Hyperlipidemia: Patient was continued on atorvastatin.  History of breast cancer: Patient has history of ductal carcinoma status post lumpectomy in 2018.  Continued anastrozole.  Recommended continued outpatient follow-up with Dr. Jana Hakim.  Hypokalemia: Acute.  Potassium prior to discharge noted to be 3.  Patient was given oral replacement.  Will need repeat lab work within 1 week.  Currently held scheduled potassium supplementation in setting of kidney disease with patient recommended to hold diuretic.  Follow UP  Follow-up Information    Eulas Post, MD. Schedule an appointment as soon as possible for a visit in 1 week(s).   Specialty:  Family Medicine Contact information: [place] [address] [phone]        Mauri Pole, MD Follow up in 2 month(s).   Specialty:  Gastroenterology Why:  Gastroenterology recommended scheduling follow-up in March of this year for possible need of pill endoscopy. Contact information: [place] [address] [phone]            Consults obtained: [facility]  Discharge Condition: Stable  Diet and Activity recommendation: See Discharge Instructions below  Discharge Instructions    Discharge instructions   Complete by:  As directed    I am recommending that you hold Coumadin until able to follow-up  with your primary care provider due to your recent GI bleed.  Due to worsening kidney function we also recommended that you hold furosemide, metolazone, and potassium supplementation.  Please follow-up with your primary doctor within 1 week to have repeat blood work of CBC and BMP.  Your primary care provider will need to reassess you to determine if it is safe and appropriate for you to be restarted on Coumadin.  They will also be the ones to reassess for when it is safe and appropriate to be started on medication such as furosemide or metolazone.  Please monitor your weight daily.  You were seen by Dr. Hilarie Fredrickson of gastroenterology who works and they were recommending a follow-up for capsule endoscopy arranged for March.  Follow with Primary MD Eulas Post, MD in 7 days  ( we routinely change or add medications that can affect your baseline labs and fluid status, therefore we recommend that you get the mentioned basic workup next visit with your PCP, your PCP may decide not to get them or add new tests based on their clinical decision)  Activity: As tolerated with fall precautions  Disposition: Home   Diet: Heart healthy and carb modified         For Heart failure patients - Check your Weight same time everyday, if you gain over 2 pounds, or you develop in leg swelling, experience more shortness of breath or chest pain, call your Primary doctor immediately. Follow Cardiac Low Salt Diet and 1.5 lit/day fluid restriction.  Special Instructions: If you have smoked or chewed Tobacco  in the last 2 yrs please stop smoking, stop any regular Alcohol  and or any Recreational drug use.  On your next visit with your primary care physician please Get Medicines reviewed and adjusted.  Please request your Eulas Post, MD to go over all Hospital Tests and Procedure/Radiological results at the follow up, please get all Hospital records sent to your Prim MD by signing hospital release before you go  home.  If you experience worsening of your admission symptoms, develop shortness of breath, life threatening emergency, suicidal or homicidal thoughts you must seek medical attention immediately by calling 911 or calling your MD immediately  if symptoms less severe.  You Must read complete instructions/literature along with all the possible adverse reactions/side effects for all the Medicines you take and that have been prescribed to you.  Take any new Medicines after you have completely understood and accpet all the possible adverse reactions/side effects.   Do not drive, operate heavy machinery, perform activities at heights, swimming or participation in water activities or provide baby sitting services if your were admitted for syncope or siezures until you have seen by Primary MD or a Neurologist and advised to do so again.  Do not drive when taking Pain medications.  Do not take more than prescribed Pain, Sleep and Anxiety Medications  Wear Seat belts while driving.   Please note  You were cared for by a hospitalist during your hospital stay. If you have any questions about your discharge medications or the care you received while you were in the hospital after you are discharged, you can call the unit and asked to speak with the hospitalist on call if the hospitalist that took care of you is not available. Once you are discharged, your primary care physician will handle any further medical issues. Please note that NO REFILLS for any discharge medications will be authorized once you are discharged, as it is imperative that you return to your primary care physician (or establish a relationship with a primary care physician if you do not have one) for your aftercare needs so that they can reassess your need for medications and monitor your lab values.   Increase activity slowly   Complete by:  As directed         Discharge Medications     Allergies as of 07/26/2018      Reactions    Amlodipine Besylate Swelling   edema      Medication List    STOP taking these medications   furosemide 40 MG tablet Commonly known as:  LASIX   metolazone 2.5 MG tablet Commonly known as:  ZAROXOLYN   potassium chloride SA 20 MEQ tablet Commonly known as:  K-DUR,KLOR-CON   warfarin 5 MG tablet Commonly known as:  COUMADIN     TAKE these medications   anastrozole 1 MG tablet Commonly known as:  ARIMIDEX TAKE 1 TABLET BY MOUTH DAILY   atorvastatin 40 MG tablet Commonly known as:  LIPITOR TAKE ONE TABLET BY MOUTH DAILY What changed:  when to take this   calcitRIOL 0.25 MCG capsule Commonly known as:  ROCALTROL Take 0.25 mcg by mouth daily.   calcium carbonate 500 MG chewable tablet Commonly known as:  TUMS - dosed in mg elemental calcium Chew 1 tablet by mouth See admin instructions. Tuesday, Thursday, Saturday and Sunday   Cranberry 500 MG Caps Take 500 mg by mouth daily with lunch.   denosumab 60 MG/ML Sosy injection Commonly known as:  PROLIA Inject 60 mg into the skin every 6 (six) months.   feeding supplement (GLUCERNA SHAKE) Liqd Take 237 mLs by mouth 2 (two) times daily between meals.   Fish Oil 1000 MG Caps Take 1,000 mg by mouth 2 (two) times daily.   glucose blood test strip 1 each by Other route 3 (three) times daily. Use as instructed   Insulin Detemir 100 UNIT/ML Pen Commonly known as:  LEVEMIR FLEXTOUCH Inject 12 Units into the skin daily.   metoprolol succinate 25 MG 24 hr tablet Commonly known as:  TOPROL-XL Take 1-2 tablets (25-50 mg total) by mouth See admin instructions. TAKE TWO TABLET BY MOUTH EVERY MORNING AND THEN ONE TABLET BY MOUTH EVERY EVENING   multivitamin with minerals Tabs tablet Take 1 tablet by mouth daily. FOR ADULT 50+   pantoprazole 40  MG tablet Commonly known as:  PROTONIX TAKE ONE TABLET BY MOUTH DAILY       Major procedures and Radiology Reports - PLEASE review detailed and final reports for all details, in  brief -    No results found.  Micro Results     No results found for this or any previous visit (from the past 240 hour(s)).     Today   Subjective    Wilmarie Sparlin today states that she feels much better. Objective   Blood pressure (!) 130/51, pulse 70, temperature 98 F (36.7 C), temperature source Oral, resp. rate 16, height 5\' 3"  (1.6 m), weight 65.9 kg, SpO2 100 %.   Intake/Output Summary (Last 24 hours) at 07/26/2018 1324 Last data filed at 07/25/2018 2135 Gross per 24 hour  Intake 293.87 ml  Output -  Net 293.87 ml    Exam  Constitutional: Elderly female NAD, calm, comfortable Eyes: PERRL, lids and conjunctivae normal.  Patient has a disconjugate gaze ENMT: Mucous membranes are moist. Posterior pharynx clear of any exudate or lesions.  Neck: normal, supple, no masses, no thyromegaly.  No JVD Respiratory: clear to auscultation bilaterally, no wheezing, no crackles. Normal respiratory effort. No accessory muscle use.  Cardiovascular: Irregular rhythm, 2/6 systolic murmur no significant rubs / gallops. No extremity edema. 2+ pedal pulses. No carotid bruits.  Abdomen: no tenderness, no masses palpated. No hepatosplenomegaly. Bowel sounds positive.  Musculoskeletal: no clubbing / cyanosis. No joint deformity upper and lower extremities. Good ROM, no contractures. Normal muscle tone.  Skin: no rashes, lesions, ulcers. No induration Neurologic: CN 2-12 grossly intact. Sensation intact, DTR normal. Strength 5/5 in all 4.  Psychiatric: Normal judgment and insight. Alert and oriented x 3. Normal mood.    Data Review   CBC w Diff:  Lab Results  Component Value Date   WBC 9.3 07/26/2018   HGB 9.8 (L) 07/26/2018   HGB 12.4 04/18/2017   HCT 29.1 (L) 07/26/2018   HCT 36.9 04/18/2017   PLT 149 (L) 07/26/2018   PLT 181 04/18/2017   LYMPHOPCT 6 07/24/2018   LYMPHOPCT 17.7 04/18/2017   MONOPCT 4 07/24/2018   MONOPCT 8.0 04/18/2017   EOSPCT 0 07/24/2018   EOSPCT 1.5  04/18/2017   BASOPCT 0 07/24/2018   BASOPCT 1.0 04/18/2017    CMP:  Lab Results  Component Value Date   NA 139 07/26/2018   NA 135 (A) 04/26/2018   NA 134 (L) 04/18/2017   K 3.0 (L) 07/26/2018   K 3.4 (L) 04/18/2017   CL 105 07/26/2018   CO2 21 (L) 07/26/2018   CO2 26 04/18/2017   BUN 88 (H) 07/26/2018   BUN 42 (A) 04/26/2018   BUN 53.9 (H) 04/18/2017   CREATININE 3.73 (H) 07/26/2018   CREATININE 2.4 (H) 04/18/2017   GLU 79 04/26/2018   PROT 6.2 (L) 07/24/2018   PROT 7.4 04/18/2017   ALBUMIN 3.2 (L) 07/24/2018   ALBUMIN 3.4 (L) 04/18/2017   BILITOT 0.6 07/24/2018   BILITOT 0.55 04/18/2017   ALKPHOS 27 (L) 07/24/2018   ALKPHOS 42 04/18/2017   AST 24 07/24/2018   AST 28 04/18/2017   ALT 15 07/24/2018   ALT 18 04/18/2017  .   Total Time in preparing paper work, data evaluation and todays exam - 35 minutes  Norval Morton M.D on 07/26/2018 at 1:24 PM  [facility]   Office  [phone]

## 2018-07-26 NOTE — Progress Notes (Signed)
Pt discharge education and instructions completed with pt and daughter at bedside; both voices understanding and denies any questions. Pt IV and telemetry removed; pt discharge home with daughter to transport her home. Pt transported off unit via wheelchair with daughter and belongings to the side. Delia Heady RN

## 2018-07-26 NOTE — Progress Notes (Signed)
    Progress Note   Subjective  Chief Complaint: GI bleed, anemia  Reports no further bowel movement since prior to admission, her hemoglobin is stable this morning after 2 units PRBCs overnight and her INR stabilized at around 1.  Tells me that she is still feeling a little bit weak, but overall is much improved.  She had a full meal this morning and tells me is the first time she could "eat everything".  Did talk with her daughter on the phone during time of interview with her.  Answered all of her questions.  Daughter would like to be notified when patient is actually leaving the hospital so that she can pick her up.   Objective   Vital signs in last 24 hours: Temp:  [97.7 F (36.5 C)-98.3 F (36.8 C)] 98.3 F (36.8 C) (01/30 0500) Pulse Rate:  [70] 70 (01/30 0500) Resp:  [12-18] 18 (01/30 0500) BP: (111-129)/(43-70) 113/43 (01/30 0500) SpO2:  [97 %-100 %] 97 % (01/30 0500) Weight:  [65.9 kg] 65.9 kg (01/30 0500) Last BM Date: 07/25/18 General:    Elderly white female in NAD Heart:  Regular rate and rhythm; no murmurs Lungs: Respirations even and unlabored, lungs CTA bilaterally Abdomen:  Soft, nontender and nondistended. Normal bowel sounds. Extremities:  Without edema. Neurologic:  Alert and oriented,  grossly normal neurologically. Psych:  Cooperative. Normal mood and affect.  Intake/Output from previous day: 01/29 0701 - 01/30 0700 In: 832.3 [P.O.:240; I.V.:277.3; Blood:315] Out: -   Lab Results: Recent Labs    07/24/18 2213 07/25/18 1053 07/26/18 0537  WBC 15.4* 13.0* 9.3  HGB 7.3* 9.0* 9.8*  HCT 22.4* 26.1* 29.1*  PLT 209 142* 149*   BMET Recent Labs    07/24/18 2213 07/25/18 1053 07/26/18 0537  NA 133* 136 139  K 4.6 3.6 3.0*  CL 101 103 105  CO2 17* 19* 21*  GLUCOSE 230* 77 70  BUN 99* 95* 88*  CREATININE 3.65* 3.36* 3.73*  CALCIUM 10.8* 10.4* 10.2   LFT Recent Labs    07/24/18 2213  PROT 6.2*  ALBUMIN 3.2*  AST 24  ALT 15  ALKPHOS 27*    BILITOT 0.6   PT/INR Recent Labs    07/25/18 0823 07/26/18 0537  LABPROT 17.1* 14.0  INR 1.41 1.09    Assessment / Plan:   Assessment: 1.  Acute anemia in a patient with history of IDA and anemia of CKD: Previous colonoscopy and recent EGD with plans for capsule endoscopy in March for further work up 2.  GIB, dark stool: Last time was on 07/24/2018, no further bowel movements or bleeding since admission 3.  Iatrogenic coagulopathy: INR greater than 10 when patient arrived to the hospital, currently now around 1 after correction, no further bleeding 4.  CKD stage IV-V  Plan: 1.  Continue daily oral Protonix, again no need for repeat endoscopy or colonoscopy.  Patient does have a capsule endoscopy scheduled for March.  She should keep this appointment. 2.  Continue carb modified diet. 3.  From a GI standpoint patient is okay for discharge home today.  Her daughter would like a phone call when patient is ready to actually leave the building.  Thank you for your kind consultation, we will sign off.   LOS: 0 days   Levin Erp  07/26/2018, 10:01 AM

## 2018-07-26 NOTE — Care Management Obs Status (Signed)
[facility] NOTIFICATION   Patient Details  Name: TYSHAY ADEE MRN: [id number] Date of Birth: 1940/12/21   Medicare Observation Status Notification Given:  Yes    Midge Minium RN, BSN, NCM-BC, ACM-RN [phone] 07/26/2018, 12:34 PM

## 2018-07-27 ENCOUNTER — Ambulatory Visit: Payer: Self-pay

## 2018-07-27 ENCOUNTER — Telehealth: Payer: Self-pay | Admitting: Family Medicine

## 2018-07-27 DIAGNOSIS — K922 Gastrointestinal hemorrhage, unspecified: Secondary | ICD-10-CM | POA: Diagnosis present

## 2018-07-27 NOTE — Telephone Encounter (Signed)
ret'd call to pt. Re: questions on her diet.  Recently d/c'd from hospital for GI bleeding.  Was advised to f/u with PCP in one week.  Asking for guidance on eating healthy.  Per DC instructions she should follow "a heart healthy and carb modified diet."  Discussed low sodium diet (CHF) foods high in Vitamin K (AFib; Warfarin on hold with recent GI Bleeding) and diabetic food choices.  Voiced knowledge of the above.  Questions were answered.       Reason for Disposition . Health Information question, no triage required and triager able to answer question  Answer Assessment - Initial Assessment Questions 1. REASON FOR CALL or QUESTION: "What is your reason for calling today?" or "How can I best help you?" or "What question do you have that I can help answer?"     Has questions about what she can eat until she sees her PCP  Protocols used: INFORMATION ONLY CALL-A-AH  Message from Sharene Skeans sent at 07/27/2018 9:11 AM EST   Summary: advise    Pt was seen in ED and Dr advised Pt to stop some of her meds until her [facility] follow up with Dr. Elease Hashimoto. Pt is a diabetic and wants to know if some foods are ok to eat until her appt on Monday/ please advise

## 2018-07-27 NOTE — Telephone Encounter (Signed)
Unable to reach patient at time of TCM Call. Left message for patient to return call when available.  

## 2018-07-30 ENCOUNTER — Other Ambulatory Visit: Payer: Self-pay

## 2018-07-30 ENCOUNTER — Ambulatory Visit (INDEPENDENT_AMBULATORY_CARE_PROVIDER_SITE_OTHER): Payer: Medicare Other | Admitting: Family Medicine

## 2018-07-30 ENCOUNTER — Ambulatory Visit: Payer: Medicare Other | Admitting: General Practice

## 2018-07-30 ENCOUNTER — Encounter: Payer: Self-pay | Admitting: Family Medicine

## 2018-07-30 VITALS — BP 122/64 | HR 70 | Temp 97.9°F | Ht 60.25 in | Wt 146.5 lb

## 2018-07-30 DIAGNOSIS — I4821 Permanent atrial fibrillation: Secondary | ICD-10-CM | POA: Diagnosis not present

## 2018-07-30 DIAGNOSIS — N184 Chronic kidney disease, stage 4 (severe): Secondary | ICD-10-CM

## 2018-07-30 DIAGNOSIS — E1122 Type 2 diabetes mellitus with diabetic chronic kidney disease: Secondary | ICD-10-CM

## 2018-07-30 DIAGNOSIS — I129 Hypertensive chronic kidney disease with stage 1 through stage 4 chronic kidney disease, or unspecified chronic kidney disease: Secondary | ICD-10-CM

## 2018-07-30 DIAGNOSIS — K922 Gastrointestinal hemorrhage, unspecified: Secondary | ICD-10-CM

## 2018-07-30 DIAGNOSIS — Z7901 Long term (current) use of anticoagulants: Secondary | ICD-10-CM | POA: Diagnosis not present

## 2018-07-30 LAB — CBC WITH DIFFERENTIAL/PLATELET
Basophils Absolute: 0 10*3/uL (ref 0.0–0.1)
Basophils Relative: 0.4 % (ref 0.0–3.0)
Eosinophils Absolute: 0.2 10*3/uL (ref 0.0–0.7)
Eosinophils Relative: 1.8 % (ref 0.0–5.0)
HCT: 28.7 % — ABNORMAL LOW (ref 36.0–46.0)
Hemoglobin: 9.8 g/dL — ABNORMAL LOW (ref 12.0–15.0)
Lymphocytes Relative: 15 % (ref 12.0–46.0)
Lymphs Abs: 1.5 10*3/uL (ref 0.7–4.0)
MCHC: 34.1 g/dL (ref 30.0–36.0)
MCV: 87.6 fl (ref 78.0–100.0)
MONO ABS: 0.7 10*3/uL (ref 0.1–1.0)
Monocytes Relative: 7.6 % (ref 3.0–12.0)
Neutro Abs: 7.3 10*3/uL (ref 1.4–7.7)
Neutrophils Relative %: 75.2 % (ref 43.0–77.0)
Platelets: 169 10*3/uL (ref 150.0–400.0)
RBC: 3.27 Mil/uL — ABNORMAL LOW (ref 3.87–5.11)
RDW: 18.6 % — AB (ref 11.5–15.5)
WBC: 9.8 10*3/uL (ref 4.0–10.5)

## 2018-07-30 LAB — BASIC METABOLIC PANEL
BUN: 47 mg/dL — ABNORMAL HIGH (ref 6–23)
CO2: 24 mEq/L (ref 19–32)
Calcium: 9.6 mg/dL (ref 8.4–10.5)
Chloride: 103 mEq/L (ref 96–112)
Creatinine, Ser: 2.65 mg/dL — ABNORMAL HIGH (ref 0.40–1.20)
GFR: 17.41 mL/min — ABNORMAL LOW (ref >60.00)
Glucose, Bld: 73 mg/dL (ref 70–99)
POTASSIUM: 3.8 meq/L (ref 3.5–5.1)
Sodium: 137 mEq/L (ref 135–145)

## 2018-07-30 LAB — POCT INR: INR: 1.4 — AB (ref 2.0–3.0)

## 2018-07-30 MED ORDER — METOPROLOL SUCCINATE ER 25 MG PO TB24: 25.0000 mg | ORAL_TABLET | ORAL | 3 refills | Status: DC

## 2018-07-30 NOTE — Patient Instructions (Addendum)
Pre visit review using our clinic review tool, if applicable. No additional management support is needed unless otherwise documented below in the visit note.  Hold coumadin for now pending labs in 2 weeks per Dr. Elease Hashimoto.  See OV not for 07/30/2018.

## 2018-07-30 NOTE — Patient Instructions (Signed)
Continue to hold the Coumadin, Lasix, Metolazone, and potassium for now.

## 2018-07-30 NOTE — Progress Notes (Signed)
Subjective:     Patient ID: Yolanda Huffman, female   DOB: November 25, 1940, 78 y.o.   MRN: [id number]  HPI  Patient is seen for hospital follow-up.  She has multiple chronic problems including history of type 2 diabetes, stage IV chronic kidney disease, hypertension, CAD, atrial fibrillation, history of combined systolic and diastolic heart failure.  She was recently admitted on the 28th after having some increasing fatigue and bloody bowel movement prior to admission.  She has known history of severe diverticulosis and anemia of chronic disease.  She had recently been seen by GI and also and had hematology oncology follow-up.  She had ferritin level of 220 on 07/18/2018 and hemoglobin 12.1 on the same date.  Her recent INR on 07/09/2018 was 2.3.  She had recent upper endoscopy on 07/03/2018 which showed Candida esophagitis and she was placed on fluconazole.  When she presented for admission with lower GI bleed she had INR greater than 10 and was felt this was likely related to the fluconazole.  She was started on IV Protonix and  GI consulted.  Patient was given 2 units of red blood cells and also received IV vitamin K.  Her Coumadin was held on admission and at discharge.  She had recent colonoscopy on 07/30/2015 which showed severe diverticulosis.  There were plans for capsule endoscopy.  Patient's hemoglobin dropped from 12 range down to 7.3 on admission.  Suspected diverticular bleed.  She has A. fib history and that is why she is on Coumadin.  Patient also had low blood pressure and other medications were held including metolazone, furosemide, and potassium.  Type 2 diabetes.  A1c late December was 5.9%.  CKD followed by nephrology.  Past Medical History:  Diagnosis Date  . Anemia   . Arthritis    gout  . Atrial fibrillation ([place])    prior Sotalol - d/c'd 2/2 increased creatinine; rate control strategy  . CAD (coronary artery disease)    s/p PCI in [place] in 1/09  . Cancer [facility])    left breast   . Chronic combined systolic and diastolic heart failure (HCC)    Echocardiogram 3/12: Mild LVH, EF 96-75%, normal diastolic function, mild AI, mild MR, PASP 44, normal wall motion  . CKD (chronic kidney disease)    sees Dr. Burman Foster - Stage 3 ?  Marland Kitchen Diabetes mellitus   . Hyperlipemia   . Hypertension   . MI, old    2009  . Osteoporosis   . Pacemaker   . Pleural effusion   . Pneumonia   . PUD (peptic ulcer disease) 10/2010   duodenal ulcer   Past Surgical History:  Procedure Laterality Date  . BREAST BIOPSY Left 07/29/2016  . BREAST LUMPECTOMY Left 09/19/2016  . BREAST LUMPECTOMY WITH RADIOACTIVE SEED LOCALIZATION Left 09/19/2016   Procedure: LEFT BREAST LUMPECTOMY WITH RADIOACTIVE SEED LOCALIZATION;  Surgeon: Autumn Messing III, MD;  Location: [place];  Service: General;  Laterality: Left;  . CATARACT EXTRACTION Bilateral    01/12/09 and 01/26/09 both eyes  . COLONOSCOPY    . CORONARY ANGIOPLASTY  2009  . PACEMAKER INSERTION  07/13/07  . stent implant  07/13/2007  . TOTAL HIP ARTHROPLASTY Right 07/23/2017   Procedure: TOTAL HIP ARTHROPLASTY ANTERIOR APPROACH;  Surgeon: Mcarthur Rossetti, MD;  Location: [place];  Service: Orthopedics;  Laterality: Right;    reports that she quit smoking about 26 years ago. Her smoking use included cigarettes. She has a 40.00 pack-year smoking history. She has never  used smokeless tobacco. She reports that she does not drink alcohol or use drugs. family history includes Breast cancer (age of onset: 46) in an other family member; Breast cancer (age of onset: 55) in her maternal aunt; Breast cancer (age of onset: 85) in her sister; Clotting disorder in her father; Heart attack in her father; Heart disease in her father and mother; Hypertension in her father. Allergies  Allergen Reactions  . Amlodipine Besylate Swelling    edema    Review of Systems  Constitutional: Positive for fatigue. Negative for unexpected weight change.  Eyes: Negative for visual  disturbance.  Respiratory: Negative for cough, chest tightness, shortness of breath and wheezing.   Cardiovascular: Negative for chest pain, palpitations and leg swelling.  Gastrointestinal: Negative for abdominal pain and blood in stool.  Genitourinary: Negative for dysuria.  Neurological: Negative for dizziness, seizures, syncope, weakness, light-headedness and headaches.       Objective:   Physical Exam Constitutional:      Appearance: She is well-developed.  Eyes:     Pupils: Pupils are equal, round, and reactive to light.  Neck:     Musculoskeletal: Neck supple.     Thyroid: No thyromegaly.     Vascular: No JVD.  Cardiovascular:     Rate and Rhythm: Normal rate and regular rhythm.     Heart sounds: No gallop.   Pulmonary:     Effort: Pulmonary effort is normal. No respiratory distress.     Breath sounds: Normal breath sounds. No wheezing or rales.  Neurological:     Mental Status: She is alert.        Assessment:     #1 recent lower GI bleed which was probably diverticular in a patient on Coumadin with recent elevated INR probably related to fluconazole.  Clinically stable now.  No orthostatic symptoms.  Symptomatically improved following transfusion.  #2 recent Candida esophagitis treated with fluconazole and symptomatically stable  #3 history of anemia of chronic disease  #4 chronic kidney disease stage IV  #5 history of atrial fibrillation.  She appears to be in sinus rhythm today clinically based on exam.  #6 type 2 diabetes which has been well controlled with most recent A1c 5.9%    Plan:     -We recommend she continue to hold Coumadin for now along with metolazone and furosemide. -Watch sodium intake closely -Recheck CBC and basic metabolic panel today -We will advise her regarding Coumadin after getting back  follow-up labs -Follow-up immediately for any recurrent GI bleeding symptoms -We have scheduled follow-up in 2 weeks to reassess and follow-up  sooner as needed  Eulas Post MD [facility]

## 2018-07-31 NOTE — Telephone Encounter (Signed)
Pt seen by Dr Elease Hashimoto 07/30/2018 for HFU  Will close encounter

## 2018-08-01 NOTE — Progress Notes (Signed)
Reviewed and agree with documentation and assessment and plan. K. Veena Nandigam , MD   

## 2018-08-03 NOTE — Progress Notes (Deleted)
Referring-Bruce Burchette, MD Reason for referral-mitral regurgitation  HPI: 78 year old female for evaluation of mitral regurgitation at request of Carolann Littler, MD.  Patient with history of coronary artery disease and has had prior PCI in [place] in 2009.  Echocardiogram March 2012 showed ejection fraction 45 to 50%, mild aortic insufficiency, mild mitral regurgitation.  Patient has a history of permanent atrial fibrillation and has had prior pacemaker placed followed by Dr. Lovena Le.  She was noted to be RV pacing with complaints of worsening fatigue and dyspnea.  Echocardiogram was ordered to evaluate for pacemaker induced LV dysfunction.  Echocardiogram October 2019 showed ejection fraction 45 to 50%, bicuspid aortic valve not excluded, moderate aortic insufficiency, mildly dilated aortic root, moderate to severe mitral regurgitation, severe left atrial enlargement, moderate to severe right atrial enlargement, moderate right ventricular enlargement, severe tricuspid regurgitation.  TEE recommended.  Current Outpatient Medications  Medication Sig Dispense Refill  . anastrozole (ARIMIDEX) 1 MG tablet TAKE 1 TABLET BY MOUTH DAILY (Patient taking differently: Take 1 mg by mouth daily. ) 90 tablet 3  . atorvastatin (LIPITOR) 40 MG tablet TAKE ONE TABLET BY MOUTH DAILY (Patient taking differently: Take 40 mg by mouth daily at 6 PM. ) 90 tablet 1  . calcitRIOL (ROCALTROL) 0.25 MCG capsule Take 0.25 mcg by mouth daily.     . calcium carbonate (TUMS - DOSED IN MG ELEMENTAL CALCIUM) 500 MG chewable tablet Chew 1 tablet by mouth See admin instructions. Tuesday, Thursday, Saturday and Sunday    . Cranberry 500 MG CAPS Take 500 mg by mouth daily with lunch.    . denosumab (PROLIA) 60 MG/ML SOSY injection Inject 60 mg into the skin every 6 (six) months.    . feeding supplement, GLUCERNA SHAKE, (GLUCERNA SHAKE) LIQD Take 237 mLs by mouth 2 (two) times daily between meals.  0  . glucose blood test strip 1  each by Other route 3 (three) times daily. Use as instructed     . Insulin Detemir (LEVEMIR FLEXTOUCH) 100 UNIT/ML Pen Inject 12 Units into the skin daily. 15 pen 2  . metoprolol succinate (TOPROL-XL) 25 MG 24 hr tablet Take 1-2 tablets (25-50 mg total) by mouth See admin instructions. TAKE TWO TABLET BY MOUTH EVERY MORNING AND THEN ONE TABLET BY MOUTH EVERY EVENING 90 tablet 3  . Multiple Vitamin (MULTIVITAMIN WITH MINERALS) TABS tablet Take 1 tablet by mouth daily. FOR ADULT 50+    . Omega-3 Fatty Acids (FISH OIL) 1000 MG CAPS Take 1,000 mg by mouth 2 (two) times daily.    . pantoprazole (PROTONIX) 40 MG tablet TAKE ONE TABLET BY MOUTH DAILY (Patient taking differently: Take 40 mg by mouth daily. ) 90 tablet 4   No current facility-administered medications for this visit.     Allergies  Allergen Reactions  . Amlodipine Besylate Swelling    edema    Past Medical History:  Diagnosis Date  . Anemia   . Arthritis    gout  . Atrial fibrillation ([place])    prior Sotalol - d/c'd 2/2 increased creatinine; rate control strategy  . CAD (coronary artery disease)    s/p PCI in [place] in 1/09  . Cancer [facility])    left breast  . Chronic combined systolic and diastolic heart failure (HCC)    Echocardiogram 3/12: Mild LVH, EF 36-62%, normal diastolic function, mild AI, mild MR, PASP 44, normal wall motion  . CKD (chronic kidney disease)    sees Dr. Burman Foster - Stage 3 ?  Marland Kitchen  Diabetes mellitus   . Hyperlipemia   . Hypertension   . MI, old    2009  . Osteoporosis   . Pacemaker   . Pleural effusion   . Pneumonia   . PUD (peptic ulcer disease) 10/2010   duodenal ulcer    Past Surgical History:  Procedure Laterality Date  . BREAST BIOPSY Left 07/29/2016  . BREAST LUMPECTOMY Left 09/19/2016  . BREAST LUMPECTOMY WITH RADIOACTIVE SEED LOCALIZATION Left 09/19/2016   Procedure: LEFT BREAST LUMPECTOMY WITH RADIOACTIVE SEED LOCALIZATION;  Surgeon: Autumn Messing III, MD;  Location: [place];  Service:  General;  Laterality: Left;  . CATARACT EXTRACTION Bilateral    01/12/09 and 01/26/09 both eyes  . COLONOSCOPY    . CORONARY ANGIOPLASTY  2009  . PACEMAKER INSERTION  07/13/07  . stent implant  07/13/2007  . TOTAL HIP ARTHROPLASTY Right 07/23/2017   Procedure: TOTAL HIP ARTHROPLASTY ANTERIOR APPROACH;  Surgeon: Mcarthur Rossetti, MD;  Location: [place];  Service: Orthopedics;  Laterality: Right;    Social History   Socioeconomic History  . Marital status: Widowed    Spouse name: Not on file  . Number of children: 1  . Years of education: Not on file  . Highest education level: Not on file  Occupational History  . Occupation: retired Theatre stage manager: RETIRED  Social Needs  . Financial resource strain: Not on file  . Food insecurity:    Worry: Not on file    Inability: Not on file  . Transportation needs:    Medical: Not on file    Non-medical: Not on file  Tobacco Use  . Smoking status: Former Smoker    Packs/day: 2.00    Years: 20.00    Pack years: 40.00    Types: Cigarettes    Last attempt to quit: 11/26/1991    Years since quitting: 26.7  . Smokeless tobacco: Never Used  Substance and Sexual Activity  . Alcohol use: No  . Drug use: No  . Sexual activity: Not on file  Lifestyle  . Physical activity:    Days per week: Not on file    Minutes per session: Not on file  . Stress: Not on file  Relationships  . Social connections:    Talks on phone: Not on file    Gets together: Not on file    Attends religious service: Not on file    Active member of club or organization: Not on file    Attends meetings of clubs or organizations: Not on file    Relationship status: Not on file  . Intimate partner violence:    Fear of current or ex partner: Not on file    Emotionally abused: Not on file    Physically abused: Not on file    Forced sexual activity: Not on file  Other Topics Concern  . Not on file  Social History Narrative   Reg exercise          Family  History  Problem Relation Age of Onset  . Heart disease Father   . Heart attack Father   . Clotting disorder Father        blood clot  . Hypertension Father   . Heart disease Mother   . Breast cancer Maternal Aunt 85  . Breast cancer Sister 69  . Breast cancer Other 40  . Colon cancer Neg Hx     ROS: no fevers or chills, productive cough, hemoptysis, dysphasia, odynophagia, melena, hematochezia,  dysuria, hematuria, rash, seizure activity, orthopnea, PND, pedal edema, claudication. Remaining systems are negative.  Physical Exam:   There were no vitals taken for this visit.  General:  Well developed/well nourished in NAD Skin warm/dry Patient not depressed No peripheral clubbing Back-normal HEENT-normal/normal eyelids Neck supple/normal carotid upstroke bilaterally; no bruits; no JVD; no thyromegaly chest - CTA/ normal expansion CV - RRR/normal S1 and S2; no murmurs, rubs or gallops;  PMI nondisplaced Abdomen -NT/ND, no HSM, no mass, + bowel sounds, no bruit 2+ femoral pulses, no bruits Ext-no edema, chords, 2+ DP Neuro-grossly nonfocal  ECG - personally reviewed  A/P  1  Kirk Ruths, MD

## 2018-08-06 ENCOUNTER — Other Ambulatory Visit: Payer: Self-pay | Admitting: Oncology

## 2018-08-08 ENCOUNTER — Ambulatory Visit: Payer: Self-pay | Admitting: *Deleted

## 2018-08-08 ENCOUNTER — Encounter: Payer: Self-pay | Admitting: Family Medicine

## 2018-08-08 ENCOUNTER — Other Ambulatory Visit: Payer: Self-pay

## 2018-08-08 ENCOUNTER — Ambulatory Visit: Payer: Medicare Other | Admitting: Family Medicine

## 2018-08-08 VITALS — BP 118/68 | HR 64 | Temp 97.5°F | Ht 60.25 in | Wt 157.9 lb

## 2018-08-08 DIAGNOSIS — R635 Abnormal weight gain: Secondary | ICD-10-CM

## 2018-08-08 DIAGNOSIS — I5042 Chronic combined systolic (congestive) and diastolic (congestive) heart failure: Secondary | ICD-10-CM | POA: Diagnosis not present

## 2018-08-08 DIAGNOSIS — R609 Edema, unspecified: Secondary | ICD-10-CM

## 2018-08-08 DIAGNOSIS — K922 Gastrointestinal hemorrhage, unspecified: Secondary | ICD-10-CM | POA: Diagnosis not present

## 2018-08-08 DIAGNOSIS — R0600 Dyspnea, unspecified: Secondary | ICD-10-CM

## 2018-08-08 LAB — BASIC METABOLIC PANEL
BUN: 41 mg/dL — ABNORMAL HIGH (ref 6–23)
CO2: 19 mEq/L (ref 19–32)
Calcium: 9.5 mg/dL (ref 8.4–10.5)
Chloride: 106 mEq/L (ref 96–112)
Creatinine, Ser: 2.56 mg/dL — ABNORMAL HIGH (ref 0.40–1.20)
GFR: 18.12 mL/min — ABNORMAL LOW (ref >60.00)
Glucose, Bld: 170 mg/dL — ABNORMAL HIGH (ref 70–99)
Potassium: 4 mEq/L (ref 3.5–5.1)
Sodium: 138 mEq/L (ref 135–145)

## 2018-08-08 LAB — CBC WITH DIFFERENTIAL/PLATELET
Basophils Absolute: 0.1 10*3/uL (ref 0.0–0.1)
Basophils Relative: 1 % (ref 0.0–3.0)
EOS ABS: 0.1 10*3/uL (ref 0.0–0.7)
Eosinophils Relative: 0.9 % (ref 0.0–5.0)
HCT: 29.5 % — ABNORMAL LOW (ref 36.0–46.0)
HEMOGLOBIN: 9.8 g/dL — AB (ref 12.0–15.0)
Lymphocytes Relative: 16 % (ref 12.0–46.0)
Lymphs Abs: 1 10*3/uL (ref 0.7–4.0)
MCHC: 33.1 g/dL (ref 30.0–36.0)
MCV: 90.4 fl (ref 78.0–100.0)
Monocytes Absolute: 0.4 10*3/uL (ref 0.1–1.0)
Monocytes Relative: 6.6 % (ref 3.0–12.0)
NEUTROS PCT: 75.5 % (ref 43.0–77.0)
Neutro Abs: 4.9 10*3/uL (ref 1.4–7.7)
Platelets: 219 10*3/uL (ref 150.0–400.0)
RBC: 3.26 Mil/uL — ABNORMAL LOW (ref 3.87–5.11)
RDW: 18.2 % — ABNORMAL HIGH (ref 11.5–15.5)
WBC: 6.4 10*3/uL (ref 4.0–10.5)

## 2018-08-08 LAB — BRAIN NATRIURETIC PEPTIDE: Pro B Natriuretic peptide (BNP): 1399 pg/mL — ABNORMAL HIGH (ref 0.0–100.0)

## 2018-08-08 NOTE — Telephone Encounter (Signed)
Feeling SOB since early this morning with mild dizziness.b/p now 134/77/70. She has a pacemaker and recently was seen by Dr. Elease Hashimoto with bloody stools. Denies CP/fever. Reports a slight cough. Appointment made for 11:40 this morning. Reviewed urgent symptoms requiring immediate evaluation patient stated she understood.  Reason for Disposition . [1] MILD difficulty breathing (e.g., minimal/no SOB at rest, SOB with walking, pulse <100) AND [2] NEW-onset or WORSE than normal  Answer Assessment - Initial Assessment Questions 1. RESPIRATORY STATUS: "Describe your breathing?" (e.g., wheezing, shortness of breath, unable to speak, severe coughing)      Got up to go to bathroom early this morning and noticed she was short of breath. 2. ONSET: "When did this breathing problem begin?"      Early this morning 3. PATTERN "Does the difficult breathing come and go, or has it been constant since it started?"      constant 4. SEVERITY: "How bad is your breathing?" (e.g., mild, moderate, severe)    - MILD: No SOB at rest, mild SOB with walking, speaks normally in sentences, can lay down, no retractions, pulse < 100.    - MODERATE: SOB at rest, SOB with minimal exertion and prefers to sit, cannot lie down flat, speaks in phrases, mild retractions, audible wheezing, pulse 100-120.    - SEVERE: Very SOB at rest, speaks in single words, struggling to breathe, sitting hunched forward, retractions, pulse > 120      moderate 5. RECURRENT SYMPTOM: "Have you had difficulty breathing before?" If so, ask: "When was the last time?" and "What happened that time?"      Yes in the past with pneumonia 6. CARDIAC HISTORY: "Do you have any history of heart disease?" (e.g., heart attack, angina, bypass surgery, angioplasty)      Yes, pacemaker 7. LUNG HISTORY: "Do you have any history of lung disease?"  (e.g., pulmonary embolus, asthma, emphysema)    none 8. CAUSE: "What do you think is causing the breathing problem?"   unsure 9. OTHER SYMPTOMS: "Do you have any other symptoms? (e.g., dizziness, runny nose, cough, chest pain, fever)     No pain.slight dizziness and mild cough. 10. PREGNANCY: "Is there any chance you are pregnant?" "When was your last menstrual period?"       no 11. TRAVEL: "Have you traveled out of the country in the last month?" (e.g., travel history, exposures)       no  Protocols used: BREATHING DIFFICULTY-A-AH

## 2018-08-08 NOTE — Telephone Encounter (Signed)
Noted  

## 2018-08-08 NOTE — Progress Notes (Signed)
Subjective:     Patient ID: Yolanda Huffman, female   DOB: 1940-09-12, 78 y.o.   MRN: [id number]  HPI Patient seen with some increased weight gain and increased shortness of breath of 1 day duration.  Refer to recent dictation of 07/30/2018 for details.  Recent admission for lower GI bleed in a patient on Coumadin with elevated INR probably related to recent fluconazole which she was taking for Candida esophagitis.  She received transfusion of 1 unit of packed red blood cells.  At discharge, her furosemide and metolazone were held.  She had some mild orthopnea last night.  Mild dyspnea with exertion.  11 pound weight gain.  Had been taking furosemide 40 mg 3 daily.  She remains off Coumadin at this time.  She has pending capsule endoscopy study with GI.  She has not had any other episodes of recent blood in stools.  Denies any recent chest pain.  No cough.  No fever.  Most recent hemoglobin 9.8.  Past Medical History:  Diagnosis Date  . Anemia   . Arthritis    gout  . Atrial fibrillation ([place])    prior Sotalol - d/c'd 2/2 increased creatinine; rate control strategy  . CAD (coronary artery disease)    s/p PCI in [place] in 1/09  . Cancer [facility])    left breast  . Chronic combined systolic and diastolic heart failure (HCC)    Echocardiogram 3/12: Mild LVH, EF 67-34%, normal diastolic function, mild AI, mild MR, PASP 44, normal wall motion  . CKD (chronic kidney disease)    sees Dr. Burman Foster - Stage 3 ?  Marland Kitchen Diabetes mellitus   . Hyperlipemia   . Hypertension   . MI, old    2009  . Osteoporosis   . Pacemaker   . Pleural effusion   . Pneumonia   . PUD (peptic ulcer disease) 10/2010   duodenal ulcer   Past Surgical History:  Procedure Laterality Date  . BREAST BIOPSY Left 07/29/2016  . BREAST LUMPECTOMY Left 09/19/2016  . BREAST LUMPECTOMY WITH RADIOACTIVE SEED LOCALIZATION Left 09/19/2016   Procedure: LEFT BREAST LUMPECTOMY WITH RADIOACTIVE SEED LOCALIZATION;  Surgeon: Autumn Messing III, MD;   Location: [place];  Service: General;  Laterality: Left;  . CATARACT EXTRACTION Bilateral    01/12/09 and 01/26/09 both eyes  . COLONOSCOPY    . CORONARY ANGIOPLASTY  2009  . PACEMAKER INSERTION  07/13/07  . stent implant  07/13/2007  . TOTAL HIP ARTHROPLASTY Right 07/23/2017   Procedure: TOTAL HIP ARTHROPLASTY ANTERIOR APPROACH;  Surgeon: Mcarthur Rossetti, MD;  Location: [place];  Service: Orthopedics;  Laterality: Right;    reports that she quit smoking about 26 years ago. Her smoking use included cigarettes. She has a 40.00 pack-year smoking history. She has never used smokeless tobacco. She reports that she does not drink alcohol or use drugs. family history includes Breast cancer (age of onset: 73) in an other family member; Breast cancer (age of onset: 59) in her maternal aunt; Breast cancer (age of onset: 7) in her sister; Clotting disorder in her father; Heart attack in her father; Heart disease in her father and mother; Hypertension in her father. Allergies  Allergen Reactions  . Amlodipine Besylate Swelling    edema       Review of Systems  Constitutional: Negative for appetite change, chills, fever and unexpected weight change.  Respiratory: Positive for shortness of breath. Negative for cough and wheezing.   Cardiovascular: Positive for leg swelling. Negative  for chest pain and palpitations.  Gastrointestinal: Negative for abdominal pain, blood in stool, diarrhea, nausea and vomiting.  Psychiatric/Behavioral: Negative for confusion.       Objective:   Physical Exam Constitutional:      General: She is not in acute distress.    Appearance: She is well-developed.  Cardiovascular:     Comments: Irregular rhythm.  Rate stable Pulmonary:     Effort: Pulmonary effort is normal.     Breath sounds: Normal breath sounds. No decreased breath sounds, wheezing or rales.  Musculoskeletal:     Right lower leg: Edema present.     Left lower leg: Edema present.     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  No central cyanosis.  She has excellent capillary refill in her hands  Neurological:     Mental Status: She is alert.        Assessment:     #1 history of combined systolic and diastolic heart failure.  Patient presents now with weight gain, increased dyspnea, increased peripheral edema after recent discontinuation of furosemide and metolazone following anemia from lower GI bleed.  Suspect combination of high-output heart failure with recent discontinuation of diuretics as major factors.  In no distress at rest.  Initial pulse oximetry was difficult to detect but at follow-up was 98%  #2 history of atrial fibrillation.  Patient currently off Coumadin secondary to GI bleed  #3 anemia related to recent lower GI bleed  #4 chronic kidney disease stage IV-followed by nephrology  #5 hypertension stable  #6 history of type 2 diabetes stable  #7 recent lower GI bleed.  Question diverticular versus versus AVMs versus other        Plan:     -Repeat labs today with CBC, basic metabolic panel, BNP level. -Start back furosemide 40 mg 2 tablets in the morning and 1 tablet at noon -May need to start back metolazone as well -Daughter states she is eating a lot of high sodium foods that she is encouraged to try to restrict this. -Follow-up in 2 days to reassess and sooner as needed -Continue close follow-up with GI and cardiology as scheduled.  Eulas Post MD [facility] at [facility]

## 2018-08-08 NOTE — Patient Instructions (Addendum)
Start back the furosemide and the potassium supplement  Try to keep sodium intake < 2,000 mg daily.  Elevate legs frequently  Follow up sooner for any increased shortness of breath.

## 2018-08-09 ENCOUNTER — Inpatient Hospital Stay (HOSPITAL_COMMUNITY)
Admission: EM | Admit: 2018-08-09 | Discharge: 2018-08-14 | DRG: 291 | Disposition: A | Discharge status: Home Health | Payer: Medicare Other | Attending: Internal Medicine | Admitting: Internal Medicine

## 2018-08-09 ENCOUNTER — Ambulatory Visit: Payer: Medicare Other | Admitting: Cardiology

## 2018-08-09 ENCOUNTER — Ambulatory Visit: Payer: Self-pay | Admitting: *Deleted

## 2018-08-09 ENCOUNTER — Encounter: Payer: Self-pay | Admitting: Family Medicine

## 2018-08-09 ENCOUNTER — Emergency Department (HOSPITAL_COMMUNITY): Payer: Medicare Other

## 2018-08-09 DIAGNOSIS — N179 Acute kidney failure, unspecified: Secondary | ICD-10-CM | POA: Diagnosis present

## 2018-08-09 DIAGNOSIS — E876 Hypokalemia: Secondary | ICD-10-CM | POA: Diagnosis present

## 2018-08-09 DIAGNOSIS — Z8249 Family history of ischemic heart disease and other diseases of the circulatory system: Secondary | ICD-10-CM

## 2018-08-09 DIAGNOSIS — I5042 Chronic combined systolic (congestive) and diastolic (congestive) heart failure: Secondary | ICD-10-CM | POA: Diagnosis present

## 2018-08-09 DIAGNOSIS — Z79899 Other long term (current) drug therapy: Secondary | ICD-10-CM

## 2018-08-09 DIAGNOSIS — I4821 Permanent atrial fibrillation: Secondary | ICD-10-CM | POA: Diagnosis not present

## 2018-08-09 DIAGNOSIS — I482 Chronic atrial fibrillation, unspecified: Secondary | ICD-10-CM | POA: Diagnosis present

## 2018-08-09 DIAGNOSIS — N184 Chronic kidney disease, stage 4 (severe): Secondary | ICD-10-CM | POA: Diagnosis present

## 2018-08-09 DIAGNOSIS — Z87891 Personal history of nicotine dependence: Secondary | ICD-10-CM

## 2018-08-09 DIAGNOSIS — I252 Old myocardial infarction: Secondary | ICD-10-CM

## 2018-08-09 DIAGNOSIS — Z794 Long term (current) use of insulin: Secondary | ICD-10-CM | POA: Diagnosis not present

## 2018-08-09 DIAGNOSIS — R918 Other nonspecific abnormal finding of lung field: Secondary | ICD-10-CM | POA: Diagnosis present

## 2018-08-09 DIAGNOSIS — I509 Heart failure, unspecified: Secondary | ICD-10-CM

## 2018-08-09 DIAGNOSIS — Z79811 Long term (current) use of aromatase inhibitors: Secondary | ICD-10-CM

## 2018-08-09 DIAGNOSIS — I5033 Acute on chronic diastolic (congestive) heart failure: Secondary | ICD-10-CM | POA: Diagnosis present

## 2018-08-09 DIAGNOSIS — E1122 Type 2 diabetes mellitus with diabetic chronic kidney disease: Secondary | ICD-10-CM | POA: Diagnosis not present

## 2018-08-09 DIAGNOSIS — I129 Hypertensive chronic kidney disease with stage 1 through stage 4 chronic kidney disease, or unspecified chronic kidney disease: Secondary | ICD-10-CM | POA: Diagnosis not present

## 2018-08-09 DIAGNOSIS — C50912 Malignant neoplasm of unspecified site of left female breast: Secondary | ICD-10-CM | POA: Diagnosis present

## 2018-08-09 DIAGNOSIS — R531 Weakness: Secondary | ICD-10-CM

## 2018-08-09 DIAGNOSIS — Z950 Presence of cardiac pacemaker: Secondary | ICD-10-CM | POA: Diagnosis not present

## 2018-08-09 DIAGNOSIS — Z803 Family history of malignant neoplasm of breast: Secondary | ICD-10-CM

## 2018-08-09 DIAGNOSIS — D649 Anemia, unspecified: Secondary | ICD-10-CM | POA: Diagnosis present

## 2018-08-09 DIAGNOSIS — Z8711 Personal history of peptic ulcer disease: Secondary | ICD-10-CM

## 2018-08-09 DIAGNOSIS — I4891 Unspecified atrial fibrillation: Secondary | ICD-10-CM | POA: Diagnosis present

## 2018-08-09 DIAGNOSIS — R0603 Acute respiratory distress: Secondary | ICD-10-CM | POA: Diagnosis present

## 2018-08-09 DIAGNOSIS — I2510 Atherosclerotic heart disease of native coronary artery without angina pectoris: Secondary | ICD-10-CM | POA: Diagnosis present

## 2018-08-09 DIAGNOSIS — R911 Solitary pulmonary nodule: Secondary | ICD-10-CM

## 2018-08-09 DIAGNOSIS — I130 Hypertensive heart and chronic kidney disease with heart failure and stage 1 through stage 4 chronic kidney disease, or unspecified chronic kidney disease: Secondary | ICD-10-CM | POA: Diagnosis not present

## 2018-08-09 DIAGNOSIS — Z7901 Long term (current) use of anticoagulants: Secondary | ICD-10-CM

## 2018-08-09 DIAGNOSIS — E785 Hyperlipidemia, unspecified: Secondary | ICD-10-CM | POA: Diagnosis present

## 2018-08-09 DIAGNOSIS — I5021 Acute systolic (congestive) heart failure: Secondary | ICD-10-CM | POA: Diagnosis not present

## 2018-08-09 LAB — BASIC METABOLIC PANEL
Anion gap: 11 (ref 5–15)
BUN: 38 mg/dL — ABNORMAL HIGH (ref 8–23)
CO2: 16 mmol/L — ABNORMAL LOW (ref 22–32)
Calcium: 9.7 mg/dL (ref 8.9–10.3)
Chloride: 110 mmol/L (ref 98–111)
Creatinine, Ser: 2.81 mg/dL — ABNORMAL HIGH (ref 0.44–1.00)
GFR calc Af Amer: 18 mL/min — ABNORMAL LOW (ref >60)
GFR calc non Af Amer: 15 mL/min — ABNORMAL LOW (ref >60)
Glucose, Bld: 76 mg/dL (ref 70–99)
Potassium: 3.9 mmol/L (ref 3.5–5.1)
Sodium: 137 mmol/L (ref 135–145)

## 2018-08-09 LAB — CBC WITH DIFFERENTIAL/PLATELET
Abs Immature Granulocytes: 0.03 10*3/uL (ref 0.00–0.07)
Basophils Absolute: 0.1 10*3/uL (ref 0.0–0.1)
Basophils Relative: 1 %
Eosinophils Absolute: 0 10*3/uL (ref 0.0–0.5)
Eosinophils Relative: 0 %
HCT: 32.8 % — ABNORMAL LOW (ref 36.0–46.0)
Hemoglobin: 9.9 g/dL — ABNORMAL LOW (ref 12.0–15.0)
Immature Granulocytes: 0 %
Lymphocytes Relative: 12 %
Lymphs Abs: 0.9 10*3/uL (ref 0.7–4.0)
MCH: 29.5 pg (ref 26.0–34.0)
MCHC: 30.2 g/dL (ref 30.0–36.0)
MCV: 97.6 fL (ref 80.0–100.0)
Monocytes Absolute: 0.4 10*3/uL (ref 0.1–1.0)
Monocytes Relative: 6 %
Neutro Abs: 5.6 10*3/uL (ref 1.7–7.7)
Neutrophils Relative %: 81 %
Platelets: 211 10*3/uL (ref 150–400)
RBC: 3.36 MIL/uL — ABNORMAL LOW (ref 3.87–5.11)
RDW: 18.8 % — ABNORMAL HIGH (ref 11.5–15.5)
WBC: 6.9 10*3/uL (ref 4.0–10.5)
nRBC: 0.3 % — ABNORMAL HIGH (ref 0.0–0.2)

## 2018-08-09 LAB — URINALYSIS, ROUTINE W REFLEX MICROSCOPIC
Bacteria, UA: NONE SEEN
Bilirubin Urine: NEGATIVE
Glucose, UA: NEGATIVE mg/dL
Ketones, ur: NEGATIVE mg/dL
Nitrite: NEGATIVE
Protein, ur: NEGATIVE mg/dL
Specific Gravity, Urine: 1.008 (ref 1.005–1.030)
pH: 5 (ref 5.0–8.0)

## 2018-08-09 LAB — GLUCOSE, CAPILLARY: Glucose-Capillary: 80 mg/dL (ref 70–99)

## 2018-08-09 LAB — BRAIN NATRIURETIC PEPTIDE: B Natriuretic Peptide: 1621.7 pg/mL — ABNORMAL HIGH (ref 0.0–100.0)

## 2018-08-09 MED ORDER — FUROSEMIDE 10 MG/ML IJ SOLN
100.0000 mg | Freq: Once | INTRAVENOUS | Status: AC
  Administered 2018-08-09: 100 mg via INTRAVENOUS
  Filled 2018-08-09: qty 10

## 2018-08-09 MED ORDER — HEPARIN SODIUM (PORCINE) 5000 UNIT/ML IJ SOLN: 5000.0000 [IU] | Freq: Three times a day (TID) | INTRAMUSCULAR | Status: DC

## 2018-08-09 MED ORDER — FISH OIL 1000 MG PO CAPS: 1000.0000 mg | ORAL_CAPSULE | Freq: Two times a day (BID) | ORAL | Status: DC

## 2018-08-09 MED ORDER — INSULIN DETEMIR 100 UNIT/ML FLEXPEN: 12.0000 [IU] | PEN_INJECTOR | Freq: Every day | SUBCUTANEOUS | Status: DC

## 2018-08-09 MED ORDER — CRANBERRY 500 MG PO CAPS: 500.0000 mg | ORAL_CAPSULE | Freq: Every day | ORAL | Status: DC

## 2018-08-09 MED ORDER — ACETAMINOPHEN 325 MG PO TABS: 650.0000 mg | ORAL_TABLET | ORAL | Status: DC | PRN

## 2018-08-09 MED ORDER — WARFARIN SODIUM 5 MG PO TABS: 5.0000 mg | ORAL_TABLET | Freq: Every day | ORAL | Status: DC

## 2018-08-09 MED ORDER — POTASSIUM CHLORIDE CRYS ER 20 MEQ PO TBCR
60.0000 meq | EXTENDED_RELEASE_TABLET | Freq: Once | ORAL | Status: AC
  Administered 2018-08-09: 60 meq via ORAL
  Filled 2018-08-09: qty 3

## 2018-08-09 MED ORDER — GLUCOSE BLOOD VI STRP: 1.0000 | ORAL_STRIP | Freq: Three times a day (TID) | Status: DC

## 2018-08-09 MED ORDER — PANTOPRAZOLE SODIUM 40 MG PO TBEC
40.0000 mg | DELAYED_RELEASE_TABLET | Freq: Every day | ORAL | Status: DC
  Administered 2018-08-09 – 2018-08-14 (×6): 40 mg via ORAL
  Filled 2018-08-09 (×6): qty 1

## 2018-08-09 MED ORDER — METOPROLOL SUCCINATE ER 25 MG PO TB24: 25.0000 mg | ORAL_TABLET | ORAL | Status: DC

## 2018-08-09 MED ORDER — ADULT MULTIVITAMIN W/MINERALS CH
1.0000 | ORAL_TABLET | Freq: Every day | ORAL | Status: DC
  Administered 2018-08-10 – 2018-08-14 (×5): 1 via ORAL
  Filled 2018-08-09 (×5): qty 1

## 2018-08-09 MED ORDER — ANASTROZOLE 1 MG PO TABS
1.0000 mg | ORAL_TABLET | Freq: Every day | ORAL | Status: DC
  Administered 2018-08-10 – 2018-08-14 (×5): 1 mg via ORAL
  Filled 2018-08-09 (×6): qty 1

## 2018-08-09 MED ORDER — METOLAZONE 2.5 MG PO TABS
2.5000 mg | ORAL_TABLET | ORAL | Status: DC
  Administered 2018-08-10: 2.5 mg via ORAL
  Filled 2018-08-09: qty 1

## 2018-08-09 MED ORDER — METOPROLOL SUCCINATE ER 50 MG PO TB24
50.0000 mg | ORAL_TABLET | Freq: Every day | ORAL | Status: DC
  Administered 2018-08-10 – 2018-08-14 (×5): 50 mg via ORAL
  Filled 2018-08-09 (×5): qty 1

## 2018-08-09 MED ORDER — METOPROLOL SUCCINATE ER 25 MG PO TB24
25.0000 mg | ORAL_TABLET | Freq: Every day | ORAL | Status: DC
  Administered 2018-08-09 – 2018-08-13 (×5): 25 mg via ORAL
  Filled 2018-08-09 (×5): qty 1

## 2018-08-09 MED ORDER — CALCITRIOL 0.25 MCG PO CAPS
0.2500 ug | ORAL_CAPSULE | Freq: Every day | ORAL | Status: DC
  Administered 2018-08-09 – 2018-08-14 (×6): 0.25 ug via ORAL
  Filled 2018-08-09 (×6): qty 1

## 2018-08-09 MED ORDER — INSULIN DETEMIR 100 UNIT/ML ~~LOC~~ SOLN
12.0000 [IU] | Freq: Every day | SUBCUTANEOUS | Status: DC
  Administered 2018-08-10 – 2018-08-13 (×4): 12 [IU] via SUBCUTANEOUS
  Filled 2018-08-09 (×5): qty 0.12

## 2018-08-09 MED ORDER — CALCIUM CARBONATE ANTACID 500 MG PO CHEW
1.0000 | CHEWABLE_TABLET | ORAL | Status: DC
  Administered 2018-08-11 – 2018-08-14 (×3): 200 mg via ORAL
  Filled 2018-08-09 (×4): qty 1

## 2018-08-09 MED ORDER — GLUCERNA SHAKE PO LIQD
237.0000 mL | Freq: Two times a day (BID) | ORAL | Status: DC
  Administered 2018-08-10 – 2018-08-14 (×9): 237 mL via ORAL

## 2018-08-09 MED ORDER — DENOSUMAB 60 MG/ML ~~LOC~~ SOSY: 60.0000 mg | PREFILLED_SYRINGE | SUBCUTANEOUS | Status: DC

## 2018-08-09 MED ORDER — SODIUM CHLORIDE 0.9 % IV SOLN
250.0000 mL | INTRAVENOUS | Status: DC | PRN
  Administered 2018-08-11: 250 mL via INTRAVENOUS

## 2018-08-09 MED ORDER — ATORVASTATIN CALCIUM 40 MG PO TABS
40.0000 mg | ORAL_TABLET | Freq: Every day | ORAL | Status: DC
  Administered 2018-08-09 – 2018-08-13 (×5): 40 mg via ORAL
  Filled 2018-08-09 (×5): qty 1

## 2018-08-09 MED ORDER — SODIUM CHLORIDE 0.9% FLUSH
3.0000 mL | Freq: Two times a day (BID) | INTRAVENOUS | Status: DC
  Administered 2018-08-09 – 2018-08-14 (×10): 3 mL via INTRAVENOUS

## 2018-08-09 MED ORDER — POTASSIUM CHLORIDE CRYS ER 20 MEQ PO TBCR
40.0000 meq | EXTENDED_RELEASE_TABLET | Freq: Two times a day (BID) | ORAL | Status: DC
  Administered 2018-08-09 – 2018-08-10 (×2): 40 meq via ORAL
  Filled 2018-08-09 (×2): qty 2

## 2018-08-09 MED ORDER — OMEGA-3-ACID ETHYL ESTERS 1 G PO CAPS
1.0000 g | ORAL_CAPSULE | Freq: Two times a day (BID) | ORAL | Status: DC
  Administered 2018-08-10 – 2018-08-14 (×8): 1 g via ORAL
  Filled 2018-08-09 (×10): qty 1

## 2018-08-09 MED ORDER — SODIUM CHLORIDE 0.9% FLUSH: 3.0000 mL | INTRAVENOUS | Status: DC | PRN

## 2018-08-09 MED ORDER — FUROSEMIDE 10 MG/ML IJ SOLN
100.0000 mg | Freq: Two times a day (BID) | INTRAVENOUS | Status: DC
  Administered 2018-08-09 – 2018-08-11 (×4): 100 mg via INTRAVENOUS
  Filled 2018-08-09 (×4): qty 10

## 2018-08-09 MED ORDER — ONDANSETRON HCL 4 MG/2ML IJ SOLN: 4.0000 mg | Freq: Four times a day (QID) | INTRAMUSCULAR | Status: DC | PRN

## 2018-08-09 NOTE — Telephone Encounter (Signed)
Pt arrived [facility] ED.

## 2018-08-09 NOTE — Telephone Encounter (Signed)
Pt's daughter called with her mom being too weak to go to her cardiologist appointment this morning. She stated her mom told her that she was just too weak to go anywhere. She was seen by Dr. Elease Hashimoto yesterday and pt was scheduled to follow up with cardiology. Her cardiology appointment was canceled. Daughter felt like she was too weak and short of breath. Advised to call EMS if she could not get her to an ED.  But while on the phone with RN, pt got up and walked to the bathroom without her cane or walker. Daughter is requesting a call back from Dr. Elease Hashimoto. Routing and calling [facility] at [place].  Answer Assessment - Initial Assessment Questions 1. DESCRIPTION: "Describe how you are feeling."     weak 2. SEVERITY: "How bad is it?"  "Can you stand and walk?"   - MILD - Feels weak or tired, but does not interfere with work, school or normal activities   - [place] to stand and walk; weakness interferes with work, school, or normal activities   - SEVERE - Unable to stand or walk    Pt stated she could not get up this morning for her appointment.  3. ONSET:  "When did the weakness begin?"     Yesterday per pt 4. CAUSE: "What do you think is causing the weakness?"     Cardiac? 5. MEDICINES: "Have you recently started a new medicine or had a change in the amount of a medicine?"      6. OTHER SYMPTOMS: "Do you have any other symptoms?" (e.g., chest pain, fever, cough, SOB, vomiting, diarrhea, bleeding, other areas of pain)     SOB 7. PREGNANCY: "Is there any chance you are pregnant?" "When was your last menstrual period?"     n/a  Protocols used: WEAKNESS (GENERALIZED) AND FATIGUE-A-AH

## 2018-08-09 NOTE — H&P (Addendum)
History and Physical    Yolanda Huffman IYM:[id number] DOB: Dec 28, 1940 DOA: 08/09/2018  PCP: Eulas Post, MD Consultants:  none Patient coming from: home- lives alone  Chief Complaint: SOB  HPI: Yolanda Huffman is a 78 y.o. female with medical history significant for combined systolic and diastolic heart failure, atrial fibrillation on Coumadin, CKD stage IV, essential hypertension, type 2 diabetes and recent lower GI bleed (end of January this year) who presented to the ED today with c/o worsening shortness of breath and generalized weakness.  She was admitted recently for a GI bleed and INR was supratherapeutic at greater than 10.  Her Coumadin was held. It was thought that her supratherapeutic INR was likely due to fluconazole she had been given for Candida esophagitis.  She improved with transfusion of 2 units of packed red blood cells.  Suspected diverticular bleed.  Her hemoglobin prior to discharge was 9.8.  Her diuretics were also discontinued due to AKI.  She just went to see her PCP in follow-up yesterday and was restarted on her diuretics.  However today she was on her way to a cardiology appointment and she became weak and short of breath and was unable to walk back to her apartment from her car.  She is also had weight gain of about 12 lbs over her baseline (nL weight reported to be ~142 lb), increased lower extremity edema and abd girth.  She states that she had to stop 3 times to sit down while walking across her kitchen to feed her cat this morning.  She has had no chest pain.  She has had no hematochezia, melena, lightheadedness, headache.   ED Course: She was in obvious respiratory distress with any exertion and with speaking.  Oxygen sats were 99 to 100% on room air.  She was mildly hypertensive otherwise vital signs were stable.  She was afebrile.  Chest x-ray showed small bilateral pleural effusions.  Her BNP was 1621.  She did have some very mild renal insufficiency over her  baseline CKD with a creatinine of 2.81 up from 2.6.  She was given 100 mg of IV Lasix.  Review of Systems: As per HPI; otherwise review of systems reviewed and negative.   Ambulatory Status: Ambulates with walker  Past Medical History:  Diagnosis Date  . Anemia   . Arthritis    gout  . Atrial fibrillation ([place])    prior Sotalol - d/c'd 2/2 increased creatinine; rate control strategy  . CAD (coronary artery disease)    s/p PCI in [place] in 1/09  . Cancer [facility])    left breast  . Chronic combined systolic and diastolic heart failure (HCC)    Echocardiogram 3/12: Mild LVH, EF 76-80%, normal diastolic function, mild AI, mild MR, PASP 44, normal wall motion  . CKD (chronic kidney disease)    sees Dr. Burman Foster - Stage 3 ?  Marland Kitchen Diabetes mellitus   . Hyperlipemia   . Hypertension   . MI, old    2009  . Osteoporosis   . Pacemaker   . Pleural effusion   . Pneumonia   . PUD (peptic ulcer disease) 10/2010   duodenal ulcer    Past Surgical History:  Procedure Laterality Date  . BREAST BIOPSY Left 07/29/2016  . BREAST LUMPECTOMY Left 09/19/2016  . BREAST LUMPECTOMY WITH RADIOACTIVE SEED LOCALIZATION Left 09/19/2016   Procedure: LEFT BREAST LUMPECTOMY WITH RADIOACTIVE SEED LOCALIZATION;  Surgeon: Autumn Messing III, MD;  Location: [place];  Service: General;  Laterality: Left;  . CATARACT EXTRACTION Bilateral    01/12/09 and 01/26/09 both eyes  . COLONOSCOPY    . CORONARY ANGIOPLASTY  2009  . PACEMAKER INSERTION  07/13/07  . stent implant  07/13/2007  . TOTAL HIP ARTHROPLASTY Right 07/23/2017   Procedure: TOTAL HIP ARTHROPLASTY ANTERIOR APPROACH;  Surgeon: Mcarthur Rossetti, MD;  Location: [place];  Service: Orthopedics;  Laterality: Right;    Social History   Socioeconomic History  . Marital status: Widowed    Spouse name: Not on file  . Number of children: 1  . Years of education: Not on file  . Highest education level: Not on file  Occupational History  . Occupation: retired Stage manager: RETIRED  Social Needs  . Financial resource strain: Not on file  . Food insecurity:    Worry: Not on file    Inability: Not on file  . Transportation needs:    Medical: Not on file    Non-medical: Not on file  Tobacco Use  . Smoking status: Former Smoker    Packs/day: 2.00    Years: 20.00    Pack years: 40.00    Types: Cigarettes    Last attempt to quit: 11/26/1991    Years since quitting: 26.7  . Smokeless tobacco: Never Used  Substance and Sexual Activity  . Alcohol use: No  . Drug use: No  . Sexual activity: Not on file  Lifestyle  . Physical activity:    Days per week: Not on file    Minutes per session: Not on file  . Stress: Not on file  Relationships  . Social connections:    Talks on phone: Not on file    Gets together: Not on file    Attends religious service: Not on file    Active member of club or organization: Not on file    Attends meetings of clubs or organizations: Not on file    Relationship status: Not on file  . Intimate partner violence:    Fear of current or ex partner: Not on file    Emotionally abused: Not on file    Physically abused: Not on file    Forced sexual activity: Not on file  Other Topics Concern  . Not on file  Social History Narrative   Reg exercise          Allergies  Allergen Reactions  . Amlodipine Besylate Swelling    edema    Family History  Problem Relation Age of Onset  . Heart disease Father   . Heart attack Father   . Clotting disorder Father        blood clot  . Hypertension Father   . Heart disease Mother   . Breast cancer Maternal Aunt 36  . Breast cancer Sister 40  . Breast cancer Other 40  . Colon cancer Neg Hx     Prior to Admission medications   Medication Sig Start Date End Date Taking? Authorizing Provider  anastrozole (ARIMIDEX) 1 MG tablet TAKE 1 TABLET BY MOUTH DAILY Patient taking differently: Take 1 mg by mouth daily.  12/18/17  Yes Magrinat, Virgie Dad, MD  atorvastatin  (LIPITOR) 40 MG tablet TAKE ONE TABLET BY MOUTH DAILY Patient taking differently: Take 40 mg by mouth daily at 6 PM.  05/04/18  Yes Burchette, Alinda Sierras, MD  calcitRIOL (ROCALTROL) 0.25 MCG capsule Take 0.25 mcg by mouth daily.  10/13/14  Yes [provider]  calcium carbonate (TUMS -  DOSED IN MG ELEMENTAL CALCIUM) 500 MG chewable tablet Chew 1 tablet by mouth See admin instructions. Tuesday, Thursday, Saturday and Sunday   Yes [provider]  Cranberry 500 MG CAPS Take 500 mg by mouth daily with lunch.   Yes [provider]  denosumab (PROLIA) 60 MG/ML SOSY injection Inject 60 mg into the skin every 6 (six) months.   Yes [provider]  feeding supplement, GLUCERNA SHAKE, (GLUCERNA SHAKE) LIQD Take 237 mLs by mouth 2 (two) times daily between meals. Patient taking differently: Take 237 mLs by mouth 2 (two) times daily between meals. Ensure 07/25/17  Yes Debbe Odea, MD  furosemide (LASIX) 40 MG tablet Take 40 mg by mouth 2 (two) times daily. Take 40 mg tablets each AM and one at lunch   Yes [provider]  Insulin Detemir (LEVEMIR FLEXTOUCH) 100 UNIT/ML Pen Inject 12 Units into the skin daily. Patient taking differently: Inject 12 Units into the skin at bedtime.  01/29/18  Yes Burchette, Alinda Sierras, MD  metoprolol succinate (TOPROL-XL) 25 MG 24 hr tablet Take 1-2 tablets (25-50 mg total) by mouth See admin instructions. TAKE TWO TABLET BY MOUTH EVERY MORNING AND THEN ONE TABLET BY MOUTH EVERY EVENING Patient taking differently: Take 25-50 mg by mouth See admin instructions. Take 50 mg in the morning and 25 mg in the evening 07/30/18  Yes Burchette, Alinda Sierras, MD  Multiple Vitamin (MULTIVITAMIN WITH MINERALS) TABS tablet Take 1 tablet by mouth daily. FOR ADULT 50+   Yes [provider]  Omega-3 Fatty Acids (FISH OIL) 1000 MG CAPS Take 1,000 mg by mouth 2 (two) times daily.   Yes [provider]  pantoprazole (PROTONIX) 40 MG tablet TAKE ONE TABLET  BY MOUTH DAILY Patient taking differently: Take 40 mg by mouth daily.  04/13/18  Yes Burchette, Alinda Sierras, MD  potassium chloride SA (K-DUR,KLOR-CON) 20 MEQ tablet Take 40 mEq by mouth 2 (two) times daily. In the morning and at lunch   Yes [provider]  glucose blood test strip 1 each by Other route 3 (three) times daily. Use as instructed     [provider]  metolazone (ZAROXOLYN) 2.5 MG tablet Take 2.5 mg by mouth every Monday, Wednesday, and Friday.    [provider]  warfarin (COUMADIN) 5 MG tablet Take 5 mg by mouth daily.    [provider]    Physical Exam: Vitals:   08/09/18 1445 08/09/18 1517 08/09/18 1545 08/09/18 1630  BP: (!) 160/69 (!) 142/74 (!) 141/79   Pulse: 71 73 71 71  Resp: (!) 23 19 (!) 23 16  Temp:      TempSrc:      SpO2: 100% 100% 100% 97%     . General: Patient is in mild to moderate respiratory distress with 5-6 word dyspnea and tachypnea.  No accessory muscle use. . Eyes:  PERRL, EOMI, normal lids, iris . ENT:  grossly normal hearing, lips & tongue, mmm . Neck:  supple, no lymphadenopathy . Cardiovascular:  nL S1, S2, normal rate, reg rhythm, no murmur. Marland Kitchen Respiratory:  + bibasilar rales, no wheezes or rhonchi. . Abdomen:  soft, NT, moderately distended, NABS . Back:   grossly normal alignment . Skin:  no rash or lesions seen on limited exam . Musculoskeletal:  grossly normal tone BUE/BLE, good ROM, no bony abnormality or obvious joint deformity . Lower extremities: Trace LE edema.  Limited foot exam with no ulcerations.  2+ distal pulses. Marland Kitchen Psychiatric:  grossly normal mood and affect, speech fluent and appropriate, AOx3 . Neurologic:  CN 2-12 grossly intact, moves all extremities in coordinated fashion, sensation intact, Patellar DTRs 2+ and symmetric    Radiological Exams on Admission: Dg Chest 2 View  Result Date: 08/09/2018 CLINICAL DATA:  Shortness of breath.  Generalized weakness. EXAM: CHEST - 2 VIEW  COMPARISON:  Chest radiograph 07/22/2017 FINDINGS: Multi lead pacer apparatus overlies the left hemithorax. Monitoring leads overlie the patient. Stable cardiomegaly. Aortic atherosclerosis. Iinterval development of bilateral lower lung consolidative opacities. Right upper lung opacities. Small bilateral pleural effusions. Thoracic spine degenerative changes. IMPRESSION: New small bilateral pleural effusions with underlying opacities which may represent atelectasis or infection. Additionally, there is suggestion of consolidation within the medial right upper lung. Recommend attention on short-term follow-up chest radiograph to ensure resolution and exclude the possibility of underlying lesion. Cardiomegaly. Electronically Signed   By: Lovey Newcomer M.D.   On: 08/09/2018 12:27    EKG: Independently reviewed.  Date/Time:                  Thursday August 09 2018 10:57:40 EST Ventricular Rate:         75 PR Interval:                   QRS Duration: 176 QT Interval:                 484 QTC Calculation:        541 R Axis:                         140 Text Interpretation:       ventricular-paced complexes    Labs on Admission: I have personally reviewed the available labs and imaging studies at the time of the admission.  Pertinent labs:  Sodium 137 potassium 3.9 chloride 110 CO2 16 glucose 76 BUN 38 creatinine 2.81 with a recent baseline of 2.5-2.6 BNP 1621.7 CBC unremarkable with hemoglobin of 9.9 which is her baseline   Assessment/Plan Principal Problem:   Acute CHF (congestive heart failure) (HCC) Active Problems:   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 (HCC)   Hyperlipidemia   Coronary atherosclerosis   ATRIAL FIBRILLATION   Cardiac pacemaker in situ   Chronic anticoagulation   Long term (current) use of anticoagulants   Chronic combined systolic and diastolic CHF (congestive heart failure) (HCC)   Acute on chronic combined systolic and diastolic heart failure, due to patient  being off her diuretics after they were held due to AKI on her last admission.  -Admit to inpatient, cardiac telemetry -Lasix 100 mg every 12 hours scheduled, titrate as needed; continue metolazone 2.5 mg daily -Strict I's and O's, daily weights -Low sodium diet, 1500 cc fluid restriction -Monitor daily basic metabolic panel: renal function, electrolytes -Continue potassium supplementation  Type 2 diabetes mellitus, non-insulin-dependent -Carb controlled diet -SSI  Hyperlipidemia: -Continue atorvastatin  CAD: Stable -Continue beta-blocker, statin  Atrial fibrillation -Currently not on Coumadin due to recent GI bleed.  Hemoglobin is stable with no signs of current GI bleeding. -Continue to hold Coumadin for now -She is rate controlled on metoprolol  Incidental finding of consolidation in the medial right upper lobe of lung: Other than her shortness of breath she has no symptoms of pneumonia.  She is afebrile, has no cough.  White blood cell count is normal.  Short-term follow-up chest x-ray to ensure resolution and exclude the possibility  of underlying lesion    DVT prophylaxis: SCDs (recent GIB) Code Status:  Full - confirmed with patient/family Family Communication: none  Disposition Plan:  Home once clinically improved Consults called: none  Admission status: Admit - It is my clinical opinion that admission to INPATIENT is reasonable and necessary because of the expectation that this patient will require hospital care that crosses at least 2 midnights to treat this condition based on the medical complexity of the problems presented.  Given the aforementioned information, the predictability of an adverse outcome is felt to be significant.    Janora Norlander MD [facility]  If note is complete, please contact covering daytime or nighttime physician. [email or web address] Password [facility]  08/09/2018, 4:55 PM

## 2018-08-09 NOTE — ED Notes (Signed)
Admitting MD at bedside.

## 2018-08-09 NOTE — ED Notes (Signed)
Patient transported to x-ray. ?

## 2018-08-09 NOTE — ED Triage Notes (Signed)
Per [facility]: Patient to ED from home c/o shortness of breath and generalized weakness - seen at PCP yesterday for f/u bloodwork (recently admitted for GI bleed) and stated "everything came back okay." Patient stopped taking Lasix and blood thinner since and denies black stools or pain. She endorses weight gain since stopping Lasix. Denies chest pain, N/V, cough, fevers/chills. Resp e/u, skin w/d.

## 2018-08-09 NOTE — ED Notes (Signed)
IV access unsuccessful x2

## 2018-08-09 NOTE — ED Provider Notes (Signed)
[facility] EMERGENCY DEPARTMENT Provider Note   CSN: [id number] Arrival date & time: 08/09/18  1050     History   Chief Complaint Chief Complaint  Patient presents with  . Shortness of Breath    HPI Yolanda Huffman is a 78 y.o. female.  HPI   78 year old female with dyspnea and generalized weakness.  Recent admission for GI bleed.  Anticoagulant and diuretics were held at the time of discharge.  She began feeling increasingly short of breath over the past few days.  Worse with ambulation.  She went to see her PCP yesterday who restarted the diuretics.  Today she was going to a cardiology appointment when she felt generally weak and short of breath.  Daughter states that she was unable to walk to the door of her apartment.  Patient denies any acute pain.  No fevers or chills.  Some mild lower extremity swelling.  Occasional nonproductive cough  Past Medical History:  Diagnosis Date  . Anemia   . Arthritis    gout  . Atrial fibrillation ([place])    prior Sotalol - d/c'd 2/2 increased creatinine; rate control strategy  . CAD (coronary artery disease)    s/p PCI in [place] in 1/09  . Cancer [facility])    left breast  . Chronic combined systolic and diastolic heart failure (HCC)    Echocardiogram 3/12: Mild LVH, EF 37-90%, normal diastolic function, mild AI, mild MR, PASP 44, normal wall motion  . CKD (chronic kidney disease)    sees Dr. Burman Foster - Stage 3 ?  Marland Kitchen Diabetes mellitus   . Hyperlipemia   . Hypertension   . MI, old    2009  . Osteoporosis   . Pacemaker   . Pleural effusion   . Pneumonia   . PUD (peptic ulcer disease) 10/2010   duodenal ulcer    Patient Active Problem List   Diagnosis Date Noted  . Gastrointestinal bleeding 07/27/2018  . Acute lower GI bleeding 07/25/2018  . Acute-on-chronic kidney injury ([place]) 07/25/2018  . Supratherapeutic INR 07/25/2018  . Iron deficiency anemia due to chronic blood loss 04/18/2018  . AKI (acute kidney injury)  ([place]) 04/17/2018  . Symptomatic anemia 04/17/2018  . Status post right hip replacement 09/04/2017  . Chronic combined systolic and diastolic CHF (congestive heart failure) ([place]) 07/25/2017  . Closed displaced fracture of right femoral neck ([place]) 07/22/2017  . Long term (current) use of anticoagulants 03/08/2017  . Genetic testing 09/07/2016  . 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 Gout 11/03/2015  . Personal history of colonic polyps 04/15/2015  . Chronic anticoagulation 04/15/2015  . Insulin dependent diabetes mellitus ([place]) 04/15/2015  . Encounter for therapeutic drug monitoring 08/01/2013  . CKD (chronic kidney disease) 06/14/2011  . Pleural effusion 06/10/2011  . DOE (dyspnea on exertion) 06/10/2011  . Edema 11/30/2010  . Duodenal ulcer 11/12/2010  . Pneumonia 11/12/2010  . Anemia associated with acute blood loss 08/12/2010  . BLOOD IN STOOL 08/06/2010  . ATRIAL FIBRILLATION 03/24/2010  . 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 Hyperlipidemia 02/23/2010  . Essential hypertension 02/23/2010  . Coronary atherosclerosis 02/23/2010  . UTI'S, RECURRENT 02/23/2010  . Osteoporosis 02/23/2010  . Cardiac pacemaker in situ 02/23/2010    Past Surgical History:  Procedure Laterality Date  . BREAST BIOPSY Left 07/29/2016  . BREAST LUMPECTOMY Left 09/19/2016  . BREAST LUMPECTOMY WITH RADIOACTIVE SEED LOCALIZATION Left 09/19/2016   Procedure: LEFT  BREAST LUMPECTOMY WITH RADIOACTIVE SEED LOCALIZATION;  Surgeon: Autumn Messing III, MD;  Location: [facility];  Service: General;  Laterality: Left;  . CATARACT EXTRACTION Bilateral    01/12/09 and 01/26/09 both eyes  . COLONOSCOPY    . CORONARY ANGIOPLASTY  2009  . PACEMAKER INSERTION  07/13/07  . stent implant  07/13/2007  . TOTAL HIP ARTHROPLASTY Right 07/23/2017   Procedure: TOTAL HIP ARTHROPLASTY ANTERIOR APPROACH;  Surgeon: Mcarthur Rossetti, MD;  Location: [facility];  Service: Orthopedics;  Laterality:  Right;     OB History   No obstetric history on file.      Home Medications    Prior to Admission medications   Medication Sig Start Date End Date Taking? Authorizing Provider  anastrozole (ARIMIDEX) 1 MG tablet TAKE 1 TABLET BY MOUTH DAILY Patient taking differently: Take 1 mg by mouth daily.  12/18/17  Yes Magrinat, Virgie Dad, MD  atorvastatin (LIPITOR) 40 MG tablet TAKE ONE TABLET BY MOUTH DAILY Patient taking differently: Take 40 mg by mouth daily at 6 PM.  05/04/18  Yes Burchette, Alinda Sierras, MD  calcitRIOL (ROCALTROL) 0.25 MCG capsule Take 0.25 mcg by mouth daily.  10/13/14  Yes [provider]  calcium carbonate (TUMS - DOSED IN MG ELEMENTAL CALCIUM) 500 MG chewable tablet Chew 1 tablet by mouth See admin instructions. Tuesday, Thursday, Saturday and Sunday   Yes [provider]  Cranberry 500 MG CAPS Take 500 mg by mouth daily with lunch.   Yes [provider]  denosumab (PROLIA) 60 MG/ML SOSY injection Inject 60 mg into the skin every 6 (six) months.   Yes [provider]  feeding supplement, GLUCERNA SHAKE, (GLUCERNA SHAKE) LIQD Take 237 mLs by mouth 2 (two) times daily between meals. Patient taking differently: Take 237 mLs by mouth 2 (two) times daily between meals. Ensure 07/25/17  Yes Debbe Odea, MD  furosemide (LASIX) 40 MG tablet Take 40 mg by mouth 2 (two) times daily. Take 40 mg tablets each AM and one at lunch   Yes [provider]  Insulin Detemir (LEVEMIR FLEXTOUCH) 100 UNIT/ML Pen Inject 12 Units into the skin daily. Patient taking differently: Inject 12 Units into the skin at bedtime.  01/29/18  Yes Burchette, Alinda Sierras, MD  metoprolol succinate (TOPROL-XL) 25 MG 24 hr tablet Take 1-2 tablets (25-50 mg total) by mouth See admin instructions. TAKE TWO TABLET BY MOUTH EVERY MORNING AND THEN ONE TABLET BY MOUTH EVERY EVENING Patient taking differently: Take 25-50 mg by mouth See admin instructions. Take 50 mg in the morning and 25 mg  in the evening 07/30/18  Yes Burchette, Alinda Sierras, MD  Multiple Vitamin (MULTIVITAMIN WITH MINERALS) TABS tablet Take 1 tablet by mouth daily. FOR ADULT 50+   Yes [provider]  Omega-3 Fatty Acids (FISH OIL) 1000 MG CAPS Take 1,000 mg by mouth 2 (two) times daily.   Yes [provider]  pantoprazole (PROTONIX) 40 MG tablet TAKE ONE TABLET BY MOUTH DAILY Patient taking differently: Take 40 mg by mouth daily.  04/13/18  Yes Burchette, Alinda Sierras, MD  potassium chloride SA (K-DUR,KLOR-CON) 20 MEQ tablet Take 40 mEq by mouth 2 (two) times daily. In the morning and at lunch   Yes [provider]  glucose blood test strip 1 each by Other route 3 (three) times daily. Use as instructed     [provider]  metolazone (ZAROXOLYN) 2.5 MG tablet Take 2.5 mg by mouth every Monday, Wednesday, and Friday.  [provider]  warfarin (COUMADIN) 5 MG tablet Take 5 mg by mouth daily.    [provider]    Family History Family History  Problem Relation Age of Onset  . Heart disease Father   . Heart attack Father   . Clotting disorder Father        blood clot  . Hypertension Father   . Heart disease Mother   . Breast cancer Maternal Aunt 55  . Breast cancer Sister 66  . Breast cancer Other 40  . Colon cancer Neg Hx     Social History Social History   Tobacco Use  . Smoking status: Former Smoker    Packs/day: 2.00    Years: 20.00    Pack years: 40.00    Types: Cigarettes    Last attempt to quit: 11/26/1991    Years since quitting: 26.7  . Smokeless tobacco: Never Used  Substance Use Topics  . Alcohol use: No  . Drug use: No     Allergies   Amlodipine besylate   Review of Systems Review of Systems  All systems reviewed and negative, other than as noted in HPI.  Physical Exam Updated Vital Signs BP (!) 151/84 (BP Location: Left Arm)   Pulse 79   Temp 98.2 F (36.8 C) (Oral)   Resp 18   SpO2 100%   Physical Exam Vitals signs  and nursing note reviewed.  Constitutional:      General: She is not in acute distress.    Appearance: She is well-developed.  HENT:     Head: Normocephalic and atraumatic.  Eyes:     General:        Right eye: No discharge.        Left eye: No discharge.     Conjunctiva/sclera: Conjunctivae normal.  Neck:     Musculoskeletal: Neck supple.  Cardiovascular:     Rate and Rhythm: Normal rate and regular rhythm.     Heart sounds: Normal heart sounds. No murmur. No friction rub. No gallop.   Pulmonary:     Effort: Pulmonary effort is normal. No respiratory distress.     Breath sounds: Normal breath sounds.  Abdominal:     General: There is no distension.     Palpations: Abdomen is soft.     Tenderness: There is no abdominal tenderness.  Musculoskeletal:        General: No tenderness.     Right lower leg: Edema present.     Left lower leg: Edema present.  Skin:    General: Skin is warm and dry.  Neurological:     Mental Status: She is alert.  Psychiatric:        Behavior: Behavior normal.        Thought Content: Thought content normal.      ED Treatments / Results  Labs (all labs ordered are listed, but only abnormal results are displayed) Labs Reviewed  CBC WITH DIFFERENTIAL/PLATELET - Abnormal; Notable for the following components:      Result Value   RBC 3.36 (*)    Hemoglobin 9.9 (*)    HCT 32.8 (*)    RDW 18.8 (*)    nRBC 0.3 (*)    All other components within normal limits  BRAIN NATRIURETIC PEPTIDE - Abnormal; Notable for the following components:   B Natriuretic Peptide 1,621.7 (*)    All other components within normal limits  BASIC METABOLIC PANEL - Abnormal; Notable for the following components:   CO2 16 (*)  BUN 38 (*)    Creatinine, Ser 2.81 (*)    GFR calc non Af Amer 15 (*)    GFR calc Af Amer 18 (*)    All other components within normal limits  URINALYSIS, ROUTINE W REFLEX MICROSCOPIC    EKG EKG Interpretation  Date/Time:  Thursday August 09 2018 10:57:40 EST Ventricular Rate:  75 PR Interval:    QRS Duration: 176 QT Interval:  484 QTC Calculation: 541 R Axis:   140 Text Interpretation:  ventricular-paced complexes Confirmed by Virgel Manifold [phone]) on 08/09/2018 11:19:22 AM   Radiology Dg Chest 2 View  Result Date: 08/09/2018 CLINICAL DATA:  Shortness of breath.  Generalized weakness. EXAM: CHEST - 2 VIEW COMPARISON:  Chest radiograph 07/22/2017 FINDINGS: Multi lead pacer apparatus overlies the left hemithorax. Monitoring leads overlie the patient. Stable cardiomegaly. Aortic atherosclerosis. Iinterval development of bilateral lower lung consolidative opacities. Right upper lung opacities. Small bilateral pleural effusions. Thoracic spine degenerative changes. IMPRESSION: New small bilateral pleural effusions with underlying opacities which may represent atelectasis or infection. Additionally, there is suggestion of consolidation within the medial right upper lung. Recommend attention on short-term follow-up chest radiograph to ensure resolution and exclude the possibility of underlying lesion. Cardiomegaly. Electronically Signed   By: Lovey Newcomer M.D.   On: 08/09/2018 12:27    Procedures Procedures (including critical care time)  Medications Ordered in ED Medications - No data to display   Initial Impression / Assessment and Plan / ED Course  I have reviewed the triage vital signs and the nursing notes.  Pertinent labs & imaging results that were available during my care of the patient were reviewed by me and considered in my medical decision making (see chart for details).     78 year old female with volume overload.  Increasingly symptomatic.  To started back on diuretics, but at this point I feel that she needs admission per more aggressive diuresis.  Recent admission for GI bleed.  Hemoglobin is stable even in the setting of volume overload.  No overt infectious symptoms.  Lasix ordered.  Admission.  Final  Clinical Impressions(s) / ED Diagnoses   Final diagnoses:  Generalized weakness  Congestive heart failure, unspecified HF chronicity, unspecified heart failure type [facility])    ED Discharge Orders    None       Virgel Manifold, MD 08/11/18 1824

## 2018-08-09 NOTE — Telephone Encounter (Signed)
Pt's daughter called again. Requesting to speak with Dr. Elease Hashimoto. He is not in office today. Spoke with daughter and she advised pt has not eaten solid foods in over 24 hours, is extremely weak and shob. She could barely get to the bathroom and then could not get up off the toilet. Daughter is very concerned about pt. She does not think pt can make it to cardiology appt today. Advised daughter of lab results from yesterday. Also advised if she thinks pt's condition is declining, given recent admission and current labs, calling EMS for assessment and possible transport would be reasonable. She will call EMS. Will ask for transport to [facility] if necessary.

## 2018-08-09 NOTE — ED Notes (Signed)
Patient assisted onto bedside commode for urine sample - patient states she also had a bowel movement. Will not collect d/t potential contamination. Patient aware of need for sample, states she will try again in a little bit.

## 2018-08-09 NOTE — Progress Notes (Signed)
RN stated she was able to get PIV for patient

## 2018-08-10 ENCOUNTER — Ambulatory Visit: Payer: Medicare Other | Admitting: Family Medicine

## 2018-08-10 DIAGNOSIS — I5042 Chronic combined systolic (congestive) and diastolic (congestive) heart failure: Secondary | ICD-10-CM

## 2018-08-10 DIAGNOSIS — I129 Hypertensive chronic kidney disease with stage 1 through stage 4 chronic kidney disease, or unspecified chronic kidney disease: Secondary | ICD-10-CM

## 2018-08-10 DIAGNOSIS — Z950 Presence of cardiac pacemaker: Secondary | ICD-10-CM

## 2018-08-10 DIAGNOSIS — I4821 Permanent atrial fibrillation: Secondary | ICD-10-CM

## 2018-08-10 DIAGNOSIS — I5021 Acute systolic (congestive) heart failure: Secondary | ICD-10-CM

## 2018-08-10 DIAGNOSIS — E1122 Type 2 diabetes mellitus with diabetic chronic kidney disease: Secondary | ICD-10-CM

## 2018-08-10 DIAGNOSIS — N184 Chronic kidney disease, stage 4 (severe): Secondary | ICD-10-CM

## 2018-08-10 LAB — BASIC METABOLIC PANEL
Anion gap: 10 (ref 5–15)
BUN: 42 mg/dL — ABNORMAL HIGH (ref 8–23)
CO2: 17 mmol/L — ABNORMAL LOW (ref 22–32)
Calcium: 9.6 mg/dL (ref 8.9–10.3)
Chloride: 111 mmol/L (ref 98–111)
Creatinine, Ser: 3.02 mg/dL — ABNORMAL HIGH (ref 0.44–1.00)
GFR calc Af Amer: 16 mL/min — ABNORMAL LOW (ref >60)
GFR calc non Af Amer: 14 mL/min — ABNORMAL LOW (ref >60)
Glucose, Bld: 118 mg/dL — ABNORMAL HIGH (ref 70–99)
Potassium: 4.5 mmol/L (ref 3.5–5.1)
Sodium: 138 mmol/L (ref 135–145)

## 2018-08-10 LAB — GLUCOSE, CAPILLARY: Glucose-Capillary: 148 mg/dL — ABNORMAL HIGH (ref 70–99)

## 2018-08-10 MED ORDER — METOLAZONE 2.5 MG PO TABS
2.5000 mg | ORAL_TABLET | Freq: Every day | ORAL | Status: DC
  Administered 2018-08-11: 2.5 mg via ORAL
  Filled 2018-08-10: qty 1

## 2018-08-10 NOTE — Evaluation (Signed)
Physical Therapy Evaluation Patient Details Name: Yolanda Huffman MRN: [id number] DOB: Jul 31, 1940 Today's Date: 08/10/2018   History of Present Illness  78 yo female with onset of SOB and weakness was admitted, now has low O2 sats and fatigue with all mobility.  PMHx:  Recent GI bleed, pacemaker, CAD, MI, cardiomegaly, thoracic spine degenerative changes, CHF, A-fib, PUD, osteoporosis, pleural effusion, R lung lesion, breast CA 2018, CKD 4, HTN, DM,   Clinical Impression  Pt was seen for mobility assessment and checked her O2 sats with gait.  Pt was at 68% after walking, noted her quick recovery to 96% on room air.  Despite the levels she dropped to, pt is likely not quite that low but is still definitely desaturating with gait.  Will monitor her values to see if supplemental O2 is needed for home, and to encourage strengthening ex's and balance/endurance work for easing the burden of gait.    Follow Up Recommendations Home health PT;Supervision for mobility/OOB    Equipment Recommendations  Rolling walker with 5" wheels(if pt does not have a reasonable walker in good repair)    Recommendations for Other Services       Precautions / Restrictions Precautions Precautions: Fall;ICD/Pacemaker Precaution Comments: monitor O2 sats with gait Restrictions Weight Bearing Restrictions: No      Mobility  Bed Mobility Overal bed mobility: Needs Assistance Bed Mobility: Supine to Sit;Sit to Supine     Supine to sit: Supervision(for safety)        Transfers Overall transfer level: Needs assistance Equipment used: Rolling walker (2 wheeled);1 person hand held assist Transfers: Sit to/from Stand Sit to Stand: Supervision         General transfer comment: pt is somewhat uncontrolled sitting on the side of the bed, and standing up, but is able to control with light hand placement on bedrail or BSC  Ambulation/Gait Ambulation/Gait assistance: Min guard Gait Distance (Feet): 100  Feet Assistive device: Rolling walker (2 wheeled);1 person hand held assist Gait Pattern/deviations: Step-to pattern;Step-through pattern;Wide base of support;Trunk flexed;Decreased stride length Gait velocity: recued Gait velocity interpretation: <1.31 ft/sec, indicative of household ambulator General Gait Details: pt is able to maneuver walker but has significant difficulty in confined spaces, asked to use quad cane  Stairs            Wheelchair Mobility    Modified Rankin (Stroke Patients Only)       Balance Overall balance assessment: Needs assistance Sitting-balance support: Feet supported Sitting balance-Leahy Scale: Good     Standing balance support: Bilateral upper extremity supported;During functional activity Standing balance-Leahy Scale: Fair                               Pertinent Vitals/Pain Pain Assessment: No/denies pain    Home Living Family/patient expects to be discharged to:: Private residence Living Arrangements: Alone Available Help at Discharge: Family;Available PRN/intermittently Type of Home: Apartment Home Access: Level entry     Home Layout: One level Home Equipment: Kasandra Knudsen - quad;Walker - 2 wheels;Walker - 4 wheels;Grab bars - tub/shower      Prior Function Level of Independence: Independent with assistive device(s)         Comments: quad cane in the house as needed     Hand Dominance        Extremity/Trunk Assessment   Upper Extremity Assessment Upper Extremity Assessment: Overall WFL for tasks assessed    Lower Extremity Assessment Lower Extremity Assessment:  Overall WFL for tasks assessed(hips and ankles are 4+)    Cervical / Trunk Assessment Cervical / Trunk Assessment: Kyphotic  Communication   Communication: No difficulties  Cognition Arousal/Alertness: Awake/alert Behavior During Therapy: WFL for tasks assessed/performed Overall Cognitive Status: Within Functional Limits for tasks assessed                                         General Comments General comments (skin integrity, edema, etc.): pt is demonstrating a loss of O2 sats with gait down to 68% per pulse ox, but could recover to 96% within 30 seconds.      Exercises     Assessment/Plan    PT Assessment Patient needs continued PT services  PT Problem List Decreased strength;Decreased range of motion;Decreased activity tolerance;Decreased balance;Decreased mobility;Decreased coordination;Cardiopulmonary status limiting activity;Decreased safety awareness       PT Treatment Interventions DME instruction;Gait training;Functional mobility training;Therapeutic activities;Therapeutic exercise;Balance training;Neuromuscular re-education;Patient/family education    PT Goals (Current goals can be found in the Care Plan section)  Acute Rehab PT Goals Patient Stated Goal: to walk and go home to her cat PT Goal Formulation: With patient Time For Goal Achievement: 08/24/18 Potential to Achieve Goals: Good    Frequency Min 3X/week   Barriers to discharge Decreased caregiver support unable to have assistance at home with live in help per pt    Co-evaluation               AM-PAC PT "6 Clicks" Mobility  Outcome Measure Help needed turning from your back to your side while in a flat bed without using bedrails?: None Help needed moving from lying on your back to sitting on the side of a flat bed without using bedrails?: A Little Help needed moving to and from a bed to a chair (including a wheelchair)?: A Little Help needed standing up from a chair using your arms (e.g., wheelchair or bedside chair)?: A Little Help needed to walk in hospital room?: A Little Help needed climbing 3-5 steps with a railing? : A Lot 6 Click Score: 18    End of Session Equipment Utilized During Treatment: Gait belt Activity Tolerance: Patient limited by fatigue;Treatment limited secondary to medical complications  (Comment) Patient left: in bed;with call bell/phone within reach Nurse Communication: Mobility status PT Visit Diagnosis: Unsteadiness on feet (R26.81);Muscle weakness (generalized) (M62.81);Difficulty in walking, not elsewhere classified (R26.2)    Time: 4982-6415 PT Time Calculation (min) (ACUTE ONLY): 24 min   Charges:   PT Evaluation $PT Eval Moderate Complexity: 1 Mod PT Treatments $Gait Training: 8-22 mins       Ramond Dial 08/10/2018, 6:28 PM  Mee Hives, PT MS Acute Rehab Dept. Number: [place]

## 2018-08-10 NOTE — Care Management Note (Signed)
Case Management Note  Patient Details  Name: Yolanda Huffman MRN: [id number] Date of Birth: [birth year]  Subjective/Objective:     Shortness of Breath             Action/Plan: Patient lives at home alone; PCP: Eulas Post, MD; has private insurance with [facility] with prescription drug coverage; CM following for progression of care.  Expected Discharge Date:    possibly 08/15/2018              Expected Discharge Plan:  [place]; possibly will wait for Physical Therapy eval for disposition needs.  Status of Service:  In process, will continue to follow  Sherrilyn Rist [phone] 08/10/2018, 1:58 PM

## 2018-08-10 NOTE — Progress Notes (Signed)
PROGRESS NOTE    Yolanda Huffman  JSR:[id number] DOB: Sep 05, 1940 DOA: 08/09/2018 PCP: Eulas Post, MD    Brief Narrative:  78 year old female who presented with dyspnea.  She does have a significant past medical history for systolic heart failure, atrial fibrillation, chronic kidney disease stage IV, hypertension, and type 2 diabetes mellitus.  She presented with worsening dyspnea and generalized weakness. Recent hospitalization due to lower gastrointestinal bleed, received 2 units packed red blood cells and her diuretics were held.  24 hours prior to hospitalization he was seen by her primary care provider who restarted diuretics.  Symptoms were progressive despite oral diuretics, noted about 12 pound weight gain and increased lower extremity edema.  Significant decrease functional physical capacity.  Physical examination she was in respiratory distress, respiratory rate was 23, heart rate 73, blood pressure 160/69, oxygen saturation 97% on supplemental oxygen, heart S1-S2 present and rhythmic, lungs with bibasilar rales, no wheezing or rhonchi, abdomen soft nontender, positive lower extremity edema.  37, potassium 3.9, chloride 110, bicarb 16, glucose 76, BUN 38, creatinine 2.8, BNP 1621, white count 6.9, hemoglobin 9.9, hematocrit 32.8, platelets 211, urinalysis had 11-20 white cells.  Her chest x-ray had cardiomegaly, small bilateral pleural effusions and increased hilar vascular congestion.  Her EKG had a right axis deviation with ventricular pacing.   Patient was admitted to the hospital with a working diagnosis of acute decompensation of heart failure.   Assessment & Plan:   Principal Problem:   Acute CHF (congestive heart failure) (HCC) Active Problems:   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 (HCC)   Hyperlipidemia   Coronary atherosclerosis   ATRIAL FIBRILLATION   Cardiac pacemaker in situ   Chronic anticoagulation   Long term (current) use of anticoagulants   Chronic  combined systolic and diastolic CHF (congestive heart failure) (HCC)   Lesion of right lung   1. Acute on chronic systolic heart failure decompensation. Will continue diuresis with IV furosemide to target a negative fluids balance, urine output over last 24 H, 1,151 ml. On 60 mg IV furosemide q12 H, along with metolazone 2,5, mg daily. Continue B blocker with metoprolol, holding ace inh for now due to decreased GFR.   2. CKD stage IV.  Serum Cr has been stable 2,81 to 3,02, with K at 4,5 and serum bicarbonate at 17, will continue aggressive diuresis with IV furosemide and will follow on renal panel in am, avoid hypotension and nephrotoxic medications. Continue calcitriol and calcium carbonate for metabolic bone disease. Hold on potassium supplements for now.   3. Chronic atrial fibrillation. Currently on a paced rhythm, will continue rate control with metoprolol. Holding anticoagulation due to Hx of GI bleeding.   4. T2DM. Will continue glucose cover and monitoring with insulin sliding scale, patient is tolerating po well. On basal insulin levemir 12 units. Fasting glucose 118 this am.   5. Breast cancer. Continue anastrazole.   DVT prophylaxis: scd   Code Status:  full Family Communication: no family at the bedside  Disposition Plan/ discharge barriers: pending clinical improvement   Body mass index is 25.84 kg/m. Malnutrition Type:      Malnutrition Characteristics:      Nutrition Interventions:     RN Pressure Injury Documentation:     Consultants:     Procedures:     Antimicrobials:       Subjective: Patient is feeling better, but no back to baseline,. Continue to have dyspnea and lower extremity edema, no chest  pain, no nausea or vomiting.   Objective: Vitals:   08/09/18 1934 08/09/18 2314 08/10/18 0016 08/10/18 0500  BP: (!) 148/71 138/70  (!) 159/56  Pulse: 74 80  72  Resp: 20   18  Temp: 97.8 F (36.6 C)   98.7 F (37.1 C)  TempSrc: Oral   Oral    SpO2: 100%   99%  Weight:   67.2 kg   Height:        Intake/Output Summary (Last 24 hours) at 08/10/2018 1009 Last data filed at 08/10/2018 3762 Gross per 24 hour  Intake 593 ml  Output 1251 ml  Net -658 ml   Filed Weights   08/09/18 1749 08/10/18 0016  Weight: 70.9 kg 67.2 kg    Examination:   General: deconditioned  Neurology: Awake and alert, non focal  E ENT: mild pallor, no icterus, oral mucosa moist Cardiovascular: No JVD. S1-S2 present, rhythmic, no gallops, rubs, or murmurs. Trace lower extremity edema. Pulmonary: positive breath sounds bilaterally, decreased air movement, no wheezing,no rhonchi, but scattered rales. Gastrointestinal. Abdomen with no organomegaly, non tender, no rebound or guarding Skin. No rashes Musculoskeletal: no joint deformities     Data Reviewed: I have personally reviewed following labs and imaging studies  CBC: Recent Labs  Lab 08/08/18 1227 08/09/18 1151  WBC 6.4 6.9  NEUTROABS 4.9 5.6  HGB 9.8* 9.9*  HCT 29.5* 32.8*  MCV 90.4 97.6  PLT 219.0 831   Basic Metabolic Panel: Recent Labs  Lab 08/08/18 1227 08/09/18 1151 08/10/18 0231  NA 138 137 138  K 4.0 3.9 4.5  CL 106 110 111  CO2 19 16* 17*  GLUCOSE 170* 76 118*  BUN 41* 38* 42*  CREATININE 2.56* 2.81* 3.02*  CALCIUM 9.5 9.7 9.6   GFR: Estimated Creatinine Clearance: 14.3 mL/min (A) (by C-G formula based on SCr of 3.02 mg/dL (H)). Liver Function Tests: No results for input(s): AST, ALT, ALKPHOS, BILITOT, PROT, ALBUMIN in the last 168 hours. No results for input(s): LIPASE, AMYLASE in the last 168 hours. No results for input(s): AMMONIA in the last 168 hours. Coagulation Profile: No results for input(s): INR, PROTIME in the last 168 hours. Cardiac Enzymes: No results for input(s): CKTOTAL, CKMB, CKMBINDEX, TROPONINI in the last 168 hours. BNP (last 3 results) Recent Labs    08/08/18 1227  PROBNP 1,399.0*   HbA1C: No results for input(s): HGBA1C in the last  72 hours. CBG: Recent Labs  Lab 08/09/18 2312  GLUCAP 80   Lipid Profile: No results for input(s): CHOL, HDL, LDLCALC, TRIG, CHOLHDL, LDLDIRECT in the last 72 hours. Thyroid Function Tests: No results for input(s): TSH, T4TOTAL, FREET4, T3FREE, THYROIDAB in the last 72 hours. Anemia Panel: No results for input(s): VITAMINB12, FOLATE, FERRITIN, TIBC, IRON, RETICCTPCT in the last 72 hours.    Radiology Studies: I have reviewed all of the imaging during this hospital visit personally     Scheduled Meds: . anastrozole  1 mg Oral Daily  . atorvastatin  40 mg Oral q1800  . calcitRIOL  0.25 mcg Oral Daily  . [START ON 08/11/2018] calcium carbonate  1 tablet Oral Once per day on Sun Tue Thu Sat  . feeding supplement (GLUCERNA SHAKE)  237 mL Oral BID BM  . insulin detemir  12 Units Subcutaneous QHS  . metolazone  2.5 mg Oral Q M,W,F  . metoprolol succinate  50 mg Oral Daily   And  . metoprolol succinate  25 mg Oral QHS  . multivitamin with  minerals  1 tablet Oral Daily  . omega-3 acid ethyl esters  1 g Oral BID  . pantoprazole  40 mg Oral Daily  . potassium chloride SA  40 mEq Oral BID  . sodium chloride flush  3 mL Intravenous Q12H   Continuous Infusions: . sodium chloride    . furosemide 100 mg (08/10/18 0957)     LOS: 1 day        Yazleen Molock Gerome Apley, MD

## 2018-08-11 ENCOUNTER — Other Ambulatory Visit: Payer: Self-pay

## 2018-08-11 ENCOUNTER — Encounter (HOSPITAL_COMMUNITY): Payer: Self-pay

## 2018-08-11 DIAGNOSIS — E785 Hyperlipidemia, unspecified: Secondary | ICD-10-CM

## 2018-08-11 LAB — BASIC METABOLIC PANEL
Anion gap: 10 (ref 5–15)
BUN: 44 mg/dL — ABNORMAL HIGH (ref 8–23)
CO2: 21 mmol/L — ABNORMAL LOW (ref 22–32)
Calcium: 9.9 mg/dL (ref 8.9–10.3)
Chloride: 106 mmol/L (ref 98–111)
Creatinine, Ser: 3.03 mg/dL — ABNORMAL HIGH (ref 0.44–1.00)
GFR calc Af Amer: 16 mL/min — ABNORMAL LOW (ref >60)
GFR calc non Af Amer: 14 mL/min — ABNORMAL LOW (ref >60)
Glucose, Bld: 88 mg/dL (ref 70–99)
Potassium: 2.8 mmol/L — ABNORMAL LOW (ref 3.5–5.1)
Sodium: 137 mmol/L (ref 135–145)

## 2018-08-11 LAB — GLUCOSE, CAPILLARY: Glucose-Capillary: 157 mg/dL — ABNORMAL HIGH (ref 70–99)

## 2018-08-11 MED ORDER — POTASSIUM CHLORIDE CRYS ER 20 MEQ PO TBCR
40.0000 meq | EXTENDED_RELEASE_TABLET | ORAL | Status: AC
  Administered 2018-08-11 (×2): 40 meq via ORAL
  Filled 2018-08-11 (×2): qty 2

## 2018-08-11 MED ORDER — FUROSEMIDE 10 MG/ML IJ SOLN
60.0000 mg | Freq: Every day | INTRAMUSCULAR | Status: DC
  Administered 2018-08-12: 60 mg via INTRAVENOUS
  Filled 2018-08-11: qty 6

## 2018-08-11 NOTE — Plan of Care (Signed)

## 2018-08-11 NOTE — Progress Notes (Signed)
PROGRESS NOTE    Yolanda Huffman  TGG:[id number] DOB: July 30, 1940 DOA: 08/09/2018 PCP: Eulas Post, MD    Brief Narrative:  78 year old female who presented with dyspnea.  She does have a significant past medical history for systolic heart failure, atrial fibrillation, chronic kidney disease stage IV, hypertension, and type 2 diabetes mellitus.  She presented with worsening dyspnea and generalized weakness. Recent hospitalization due to lower gastrointestinal bleed, received 2 units packed red blood cells and her diuretics were held.  24 hours prior to hospitalization he was seen by her primary care provider who restarted diuretics.  Symptoms were progressive despite oral diuretics, noted about 12 pound weight gain and increased lower extremity edema.  Significant decrease functional physical capacity.  Physical examination she was in respiratory distress, respiratory rate was 23, heart rate 73, blood pressure 160/69, oxygen saturation 97% on supplemental oxygen, heart S1-S2 present and rhythmic, lungs with bibasilar rales, no wheezing or rhonchi, abdomen soft nontender, positive lower extremity edema.  37, potassium 3.9, chloride 110, bicarb 16, glucose 76, BUN 38, creatinine 2.8, BNP 1621, white count 6.9, hemoglobin 9.9, hematocrit 32.8, platelets 211, urinalysis had 11-20 white cells.  Her chest x-ray had cardiomegaly, small bilateral pleural effusions and increased hilar vascular congestion.  Her EKG had a right axis deviation with ventricular pacing.   Patient was admitted to the hospital with a working diagnosis of acute decompensation of heart failure.   Assessment & Plan:   Principal Problem:   Acute CHF (congestive heart failure) (HCC) Active Problems:   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 (HCC)   Hyperlipidemia   Coronary atherosclerosis   ATRIAL FIBRILLATION   Cardiac pacemaker in situ   Chronic anticoagulation   Long term (current) use of anticoagulants   Chronic  combined systolic and diastolic CHF (congestive heart failure) (HCC)   Lesion of right lung   1. Acute on chronic systolic heart failure decompensation. Patient with improved symptoms but still not back to baseline, will continue diuresis with IV furosemide, urine output over last 24 H, 2,350 ml. Will decrease furosemide to 60 mg daily and will hold on metolazone for now. Blood pressure 703 mmHg systolic.   2. CKD stage IV.  Renal function stable with serum cr at 3,03, with K at 2,8 and serum bicarbonate at 21. Will continue furosemide 60 mg IV daily. K correction with Kcl 80 meq in 2 divided doses. Continue with calcitriol for mineral bone disease.   3. Chronic atrial fibrillation. Continue rate control with metoprolol. Not on anticoagulation due to history of GI bleeding.  4. T2DM. Continue basal insulin levemir 12 units. Fasting glucose 88 this am. Patient tolerating po well, improved appetite.   5. Breast cancer. On anastrazole.   DVT prophylaxis: scd   Code Status:  full Family Communication: no family at the bedside  Disposition Plan/ discharge barriers: pending clinical improvement    Body mass index is 26.64 kg/m. Malnutrition Type:      Malnutrition Characteristics:      Nutrition Interventions:     RN Pressure Injury Documentation:     Consultants:     Procedures:     Antimicrobials:       Subjective: Patient is feeling better this am, but not yet back to baseline, no chest pain, no nausea or vomiting, lower extremity is improving, but still very weak and having difficulty ambulating.   Objective: Vitals:   08/11/18 0100 08/11/18 0445 08/11/18 0920 08/11/18 1237  BP: Marland Kitchen)  138/92 (!) 125/53 (!) 142/66 (!) 136/56  Pulse:  70 70 72  Resp:  18    Temp:  98.4 F (36.9 C)  98.1 F (36.7 C)  TempSrc:  Oral  Oral  SpO2:  98%  100%  Weight:      Height:        Intake/Output Summary (Last 24 hours) at 08/11/2018 1249 Last data filed at 08/11/2018  3382 Gross per 24 hour  Intake 585 ml  Output 3250 ml  Net -2665 ml   Filed Weights   08/09/18 1749 08/10/18 0016 08/11/18 0024  Weight: 70.9 kg 67.2 kg 69.3 kg    Examination:   General: Not in pain or dyspnea, deconditioned  Neurology: Awake and alert, non focal  E ENT: mild pallor, no icterus, oral mucosa moist Cardiovascular: No significant JVD. S1-S2 present, rhythmic, no gallops, rubs, or murmurs. Trace lower extremity edema. Pulmonary: decreased breath sounds bilaterally, adequate air movement, no wheezing, or rhonchi, positive bilateral rales at bases. Gastrointestinal. Abdomen with no organomegaly, non tender, no rebound or guarding Skin. No rashes Musculoskeletal: no joint deformities     Data Reviewed: I have personally reviewed following labs and imaging studies  CBC: Recent Labs  Lab 08/08/18 1227 08/09/18 1151  WBC 6.4 6.9  NEUTROABS 4.9 5.6  HGB 9.8* 9.9*  HCT 29.5* 32.8*  MCV 90.4 97.6  PLT 219.0 505   Basic Metabolic Panel: Recent Labs  Lab 08/08/18 1227 08/09/18 1151 08/10/18 0231 08/11/18 0657  NA 138 137 138 137  K 4.0 3.9 4.5 2.8*  CL 106 110 111 106  CO2 19 16* 17* 21*  GLUCOSE 170* 76 118* 88  BUN 41* 38* 42* 44*  CREATININE 2.56* 2.81* 3.02* 3.03*  CALCIUM 9.5 9.7 9.6 9.9   GFR: Estimated Creatinine Clearance: 14.5 mL/min (A) (by C-G formula based on SCr of 3.03 mg/dL (H)). Liver Function Tests: No results for input(s): AST, ALT, ALKPHOS, BILITOT, PROT, ALBUMIN in the last 168 hours. No results for input(s): LIPASE, AMYLASE in the last 168 hours. No results for input(s): AMMONIA in the last 168 hours. Coagulation Profile: No results for input(s): INR, PROTIME in the last 168 hours. Cardiac Enzymes: No results for input(s): CKTOTAL, CKMB, CKMBINDEX, TROPONINI in the last 168 hours. BNP (last 3 results) Recent Labs    08/08/18 1227  PROBNP 1,399.0*   HbA1C: No results for input(s): HGBA1C in the last 72 hours. CBG: Recent  Labs  Lab 08/09/18 2312 08/10/18 2245  GLUCAP 80 148*   Lipid Profile: No results for input(s): CHOL, HDL, LDLCALC, TRIG, CHOLHDL, LDLDIRECT in the last 72 hours. Thyroid Function Tests: No results for input(s): TSH, T4TOTAL, FREET4, T3FREE, THYROIDAB in the last 72 hours. Anemia Panel: No results for input(s): VITAMINB12, FOLATE, FERRITIN, TIBC, IRON, RETICCTPCT in the last 72 hours.    Radiology Studies: I have reviewed all of the imaging during this hospital visit personally     Scheduled Meds: . anastrozole  1 mg Oral Daily  . atorvastatin  40 mg Oral q1800  . calcitRIOL  0.25 mcg Oral Daily  . calcium carbonate  1 tablet Oral Once per day on Sun Tue Thu Sat  . feeding supplement (GLUCERNA SHAKE)  237 mL Oral BID BM  . [START ON 08/12/2018] furosemide  60 mg Intravenous Daily  . insulin detemir  12 Units Subcutaneous QHS  . metoprolol succinate  50 mg Oral Daily   And  . metoprolol succinate  25 mg Oral QHS  .  multivitamin with minerals  1 tablet Oral Daily  . omega-3 acid ethyl esters  1 g Oral BID  . pantoprazole  40 mg Oral Daily  . potassium chloride  40 mEq Oral Q4H  . sodium chloride flush  3 mL Intravenous Q12H   Continuous Infusions: . sodium chloride 250 mL (08/11/18 0924)     LOS: 2 days        Cary Wilford Gerome Apley, MD

## 2018-08-12 LAB — BASIC METABOLIC PANEL
Anion gap: 6 (ref 5–15)
BUN: 47 mg/dL — ABNORMAL HIGH (ref 8–23)
CO2: 29 mmol/L (ref 22–32)
CREATININE: 3.29 mg/dL — AB (ref 0.44–1.00)
Calcium: 10.7 mg/dL — ABNORMAL HIGH (ref 8.9–10.3)
Chloride: 102 mmol/L (ref 98–111)
GFR calc Af Amer: 15 mL/min — ABNORMAL LOW (ref >60)
GFR calc non Af Amer: 13 mL/min — ABNORMAL LOW (ref >60)
Glucose, Bld: 110 mg/dL — ABNORMAL HIGH (ref 70–99)
Potassium: 3.6 mmol/L (ref 3.5–5.1)
Sodium: 137 mmol/L (ref 135–145)

## 2018-08-12 NOTE — Progress Notes (Signed)
PROGRESS NOTE    TALONDA ARTIST  TIW:[id number] DOB: 24-Jul-1940 DOA: 08/09/2018 PCP: Eulas Post, MD    Brief Narrative:  78 year old female who presented with dyspnea. She does have a significant past medical history for systolic heart failure, atrial fibrillation, chronic kidney disease stage IV, hypertension, and type 2 diabetes mellitus. She presented with worsening dyspnea and generalized weakness. Recent hospitalization due to lower gastrointestinal bleed, received 2 units packed red blood cells and her diuretics were held. 24 hours prior to hospitalization he was seen by her primary care provider who restarted diuretics. Symptoms were progressive despite oral diuretics, noted about 12 pound weight gain and increased lower extremity edema. Significant decrease functional physical capacity. Physical examination she was in respiratory distress, respiratory rate was 23, heart rate 73, blood pressure 160/69, oxygen saturation 97% on supplemental oxygen, heart S1-S2 present and rhythmic, lungs with bibasilar rales, no wheezing or rhonchi, abdomen soft nontender, positive lower extremity edema.37, potassium 3.9, chloride 110, bicarb 16, glucose 76, BUN 38, creatinine 2.8, BNP 1621, white count 6.9, hemoglobin 9.9, hematocrit 32.8, platelets 211,urinalysis had 11-20 white cells.Her chest x-ray had cardiomegaly, small bilateral pleural effusions and increased hilar vascular congestion. Her EKG had a right axis deviation with ventricular pacing.  Patient was admitted to the hospital with a working diagnosis of acute decompensation of heart failure   Assessment & Plan:   Principal Problem:   Acute CHF (congestive heart failure) (HCC) Active Problems:   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 (HCC)   Hyperlipidemia   Coronary atherosclerosis   ATRIAL FIBRILLATION   Cardiac pacemaker in situ   Chronic anticoagulation   Long term (current) use of anticoagulants   Chronic  combined systolic and diastolic CHF (congestive heart failure) (HCC)   Lesion of right lung   1. Acute on chronic dyastolic heart failure decompensation. Left ventricle with ejection fraction 45 to 50%. Improved volume status, her urine output over last 24 H, 3,900 ml. Will transition to oral furosemide in am. Blood pressure has remained stable at 250 mmHg systolic. Will continue metoprolol for b blockade.   2. AKI on CKD stage IV. Renal function with worsening serum cr at 3,29 with K at 3,6 and serum bicarbonate at 29, will follow on renal panel in am, will transition from IV to oral furosemide. Avoid hypotension and nephrotoxic medications.   3. Chronic atrial fibrillation. Metoprolol for rate control, currently holding anticoagulation due to Hx of gi bleeding.   4. T2DM. On basal insulin with levemir 12 units, and insulin sliding scale for glucose cover and monitoring, fasting glucose this am 110 mg/dl.   5. Breast cancer. Continue with anastrazole.   DVT prophylaxis:scd  Code Status:full Family Communication:no family at the bedside   Disposition Plan/ discharge barriers:pending clinical improvement  Body mass index is 25.25 kg/m. Malnutrition Type:      Malnutrition Characteristics:      Nutrition Interventions:     RN Pressure Injury Documentation:     Consultants:     Procedures:     Antimicrobials:       Subjective: Dyspnea and edema have improved, close to baseline, no nausea or vomiting, no chest pain. Patient continue to be very weak and deconditioned.   Objective: Vitals:   08/11/18 2055 08/12/18 0541 08/12/18 0547 08/12/18 0851  BP: (!) 128/45  (!) 152/65 (!) 147/100  Pulse: 70  76 71  Resp: 18  18   Temp: 98.2 F (36.8 C)  98.9 F (  37.2 C)   TempSrc: Oral  Oral   SpO2: 98%  98%   Weight:  65.7 kg    Height:        Intake/Output Summary (Last 24 hours) at 08/12/2018 0938 Last data filed at 08/12/2018 0545 Gross per 24 hour    Intake 610.77 ml  Output 2600 ml  Net -1989.23 ml   Filed Weights   08/10/18 0016 08/11/18 0024 08/12/18 0541  Weight: 67.2 kg 69.3 kg 65.7 kg    Examination:   General: not in pain or dyspnea.  Neurology: Awake and alert, non focal  E ENT: positive pallor, no icterus, oral mucosa moist Cardiovascular: No JVD. S1-S2 present, rhythmic, no gallops, rubs, or murmurs. Trace lower extremity edema. Pulmonary: positive breath sounds bilaterally, adequate air movement, no wheezing, rhonchi or rales. Gastrointestinal. Abdomen flat, no organomegaly, non tender, no rebound or guarding Skin. No rashes Musculoskeletal: no joint deformities     Data Reviewed: I have personally reviewed following labs and imaging studies  CBC: Recent Labs  Lab 08/08/18 1227 08/09/18 1151  WBC 6.4 6.9  NEUTROABS 4.9 5.6  HGB 9.8* 9.9*  HCT 29.5* 32.8*  MCV 90.4 97.6  PLT 219.0 099   Basic Metabolic Panel: Recent Labs  Lab 08/08/18 1227 08/09/18 1151 08/10/18 0231 08/11/18 0657 08/12/18 0621  NA 138 137 138 137 137  K 4.0 3.9 4.5 2.8* 3.6  CL 106 110 111 106 102  CO2 19 16* 17* 21* 29  GLUCOSE 170* 76 118* 88 110*  BUN 41* 38* 42* 44* 47*  CREATININE 2.56* 2.81* 3.02* 3.03* 3.29*  CALCIUM 9.5 9.7 9.6 9.9 10.7*   GFR: Estimated Creatinine Clearance: 13 mL/min (A) (by C-G formula based on SCr of 3.29 mg/dL (H)). Liver Function Tests: No results for input(s): AST, ALT, ALKPHOS, BILITOT, PROT, ALBUMIN in the last 168 hours. No results for input(s): LIPASE, AMYLASE in the last 168 hours. No results for input(s): AMMONIA in the last 168 hours. Coagulation Profile: No results for input(s): INR, PROTIME in the last 168 hours. Cardiac Enzymes: No results for input(s): CKTOTAL, CKMB, CKMBINDEX, TROPONINI in the last 168 hours. BNP (last 3 results) Recent Labs    08/08/18 1227  PROBNP 1,399.0*   HbA1C: No results for input(s): HGBA1C in the last 72 hours. CBG: Recent Labs  Lab  08/09/18 2312 08/10/18 2245 08/11/18 2120  GLUCAP 80 148* 157*   Lipid Profile: No results for input(s): CHOL, HDL, LDLCALC, TRIG, CHOLHDL, LDLDIRECT in the last 72 hours. Thyroid Function Tests: No results for input(s): TSH, T4TOTAL, FREET4, T3FREE, THYROIDAB in the last 72 hours. Anemia Panel: No results for input(s): VITAMINB12, FOLATE, FERRITIN, TIBC, IRON, RETICCTPCT in the last 72 hours.    Radiology Studies: I have reviewed all of the imaging during this hospital visit personally     Scheduled Meds: . anastrozole  1 mg Oral Daily  . atorvastatin  40 mg Oral q1800  . calcitRIOL  0.25 mcg Oral Daily  . calcium carbonate  1 tablet Oral Once per day on Sun Tue Thu Sat  . feeding supplement (GLUCERNA SHAKE)  237 mL Oral BID BM  . insulin detemir  12 Units Subcutaneous QHS  . metoprolol succinate  50 mg Oral Daily   And  . metoprolol succinate  25 mg Oral QHS  . multivitamin with minerals  1 tablet Oral Daily  . omega-3 acid ethyl esters  1 g Oral BID  . pantoprazole  40 mg Oral Daily  .  sodium chloride flush  3 mL Intravenous Q12H   Continuous Infusions: . sodium chloride 250 mL (08/11/18 0924)     LOS: 3 days        Clyde Upshaw Gerome Apley, MD

## 2018-08-12 NOTE — Plan of Care (Signed)

## 2018-08-13 ENCOUNTER — Ambulatory Visit: Payer: Medicare Other | Admitting: Family Medicine

## 2018-08-13 LAB — BASIC METABOLIC PANEL
ANION GAP: 14 (ref 5–15)
Anion gap: 15 (ref 5–15)
BUN: 50 mg/dL — ABNORMAL HIGH (ref 8–23)
BUN: 52 mg/dL — ABNORMAL HIGH (ref 8–23)
CO2: 27 mmol/L (ref 22–32)
CO2: 27 mmol/L (ref 22–32)
Calcium: 11.1 mg/dL — ABNORMAL HIGH (ref 8.9–10.3)
Calcium: 11.3 mg/dL — ABNORMAL HIGH (ref 8.9–10.3)
Chloride: 94 mmol/L — ABNORMAL LOW (ref 98–111)
Chloride: 93 mmol/L — ABNORMAL LOW (ref 98–111)
Creatinine, Ser: 3.15 mg/dL — ABNORMAL HIGH (ref 0.44–1.00)
Creatinine, Ser: 3.15 mg/dL — ABNORMAL HIGH (ref 0.44–1.00)
GFR calc Af Amer: 16 mL/min — ABNORMAL LOW (ref >60)
GFR calc non Af Amer: 13 mL/min — ABNORMAL LOW (ref >60)
GFR calc non Af Amer: 13 mL/min — ABNORMAL LOW (ref >60)
GFR, EST AFRICAN AMERICAN: 16 mL/min — AB (ref >60)
Glucose, Bld: 114 mg/dL — ABNORMAL HIGH (ref 70–99)
Glucose, Bld: 137 mg/dL — ABNORMAL HIGH (ref 70–99)
Potassium: 2.6 mmol/L — CL (ref 3.5–5.1)
Potassium: 3.5 mmol/L (ref 3.5–5.1)
Sodium: 135 mmol/L (ref 135–145)
Sodium: 135 mmol/L (ref 135–145)

## 2018-08-13 MED ORDER — POTASSIUM CHLORIDE CRYS ER 20 MEQ PO TBCR
40.0000 meq | EXTENDED_RELEASE_TABLET | ORAL | Status: AC
  Administered 2018-08-13 (×2): 40 meq via ORAL
  Filled 2018-08-13 (×2): qty 2

## 2018-08-13 NOTE — Care Management Note (Signed)
Case Management Note  Patient Details  Name: RHYLIE STEHR MRN: [id number] Date of Birth: 1941-02-19  Subjective/Objective:   CHF                Action/Plan: Patient lives at home alone; supportive daughter that assist with her errands; FHL:[id number], Alinda Sierras, MD; private insurance with [employer] with prescription drug coverage; [facility] choice [facility]; patient use [employer] for transportation; she states that her home is one level and she doesn't need a walker or a cane at this time; [facility] choice offered, pt requested [facility];; [place] with [facility] called for arrangement.  Expected Discharge Date:    possible 08/14/2018              Expected Discharge Plan:  [place]  Discharge planning Services  CM Consult  Choice offered to:  Patient  HH Arranged:  RN, Disease Management, PT [place] Agency:  [facility] (formerly [facility]  Status of Service:  In process, will continue to follow  Sherrilyn Rist [phone] 08/13/2018, 9:56 AM

## 2018-08-13 NOTE — Care Management Important Message (Signed)
Important Message  Patient Details  Name: Yolanda Huffman MRN: [id number] Date of Birth: April 05, 1941   Medicare Important Message Given:  Yes    Lavon Bothwell P Shann Merrick 08/13/2018, 4:21 PM

## 2018-08-13 NOTE — Progress Notes (Signed)
CRITICAL VALUE ALERT  Critical Value:  Potassium 2.6  Date & Time Notied:  08/13/18, 0710  Provider Notified: Arrien, MD  Orders Received/Actions taken: Awaiting orders

## 2018-08-13 NOTE — Progress Notes (Signed)
Physical Therapy Treatment Patient Details Name: Yolanda Huffman MRN: [id number] DOB: 1940/09/18 Today's Date: 08/13/2018    History of Present Illness 78 yo female with onset of SOB and weakness was admitted, now has low O2 sats and fatigue with all mobility.  PMHx:  Recent GI bleed, pacemaker, CAD, MI, cardiomegaly, thoracic spine degenerative changes, CHF, A-fib, PUD, osteoporosis, pleural effusion, R lung lesion, breast CA 2018, CKD 4, HTN, DM,     PT Comments    Pt progressing well with mobility. Amb on RA with SpO2 >98%. Continue to recommend HHPT at DC.    Follow Up Recommendations  Home health PT;Supervision for mobility/OOB     Equipment Recommendations  None recommended by PT(Pt already has rollator)    Recommendations for Other Services       Precautions / Restrictions Precautions Precautions: Fall Restrictions Weight Bearing Restrictions: No    Mobility  Bed Mobility Overal bed mobility: Modified Independent Bed Mobility: Supine to Sit     Supine to sit: Modified independent (Device/Increase time);HOB elevated        Transfers Overall transfer level: Needs assistance Equipment used: 4-wheeled walker Transfers: Sit to/from Stand Sit to Stand: Supervision         General transfer comment: assist for safety  Ambulation/Gait Ambulation/Gait assistance: Supervision Gait Distance (Feet): 125 DTOI(712' x 1, 60' x 1) Assistive device: 4-wheeled walker Gait Pattern/deviations: Step-through pattern;Decreased stride length Gait velocity: decr Gait velocity interpretation: <1.31 ft/sec, indicative of household ambulator General Gait Details: supervision for safety. Pt able to manuever rollator well. Amb on RA with SpO2 >98%.   Stairs             Wheelchair Mobility    Modified Rankin (Stroke Patients Only)       Balance Overall balance assessment: Needs assistance Sitting-balance support: Feet supported Sitting balance-Leahy Scale: Good      Standing balance support: During functional activity;No upper extremity supported Standing balance-Leahy Scale: Fair                              Cognition Arousal/Alertness: Awake/alert Behavior During Therapy: WFL for tasks assessed/performed Overall Cognitive Status: Within Functional Limits for tasks assessed                                        Exercises      General Comments        Pertinent Vitals/Pain Pain Assessment: No/denies pain    Home Living                      Prior Function            PT Goals (current goals can now be found in the care plan section) Progress towards PT goals: Progressing toward goals    Frequency    Min 3X/week      PT Plan Current plan remains appropriate    Co-evaluation              AM-PAC PT "6 Clicks" Mobility   Outcome Measure  Help needed turning from your back to your side while in a flat bed without using bedrails?: None Help needed moving from lying on your back to sitting on the side of a flat bed without using bedrails?: None Help needed moving to and from a bed to a chair (  including a wheelchair)?: None Help needed standing up from a chair using your arms (e.g., wheelchair or bedside chair)?: A Little Help needed to walk in hospital room?: A Little Help needed climbing 3-5 steps with a railing? : A Lot 6 Click Score: 20    End of Session   Activity Tolerance: Patient tolerated treatment well Patient left: with call bell/phone within reach;in chair Nurse Communication: Mobility status PT Visit Diagnosis: Unsteadiness on feet (R26.81);Muscle weakness (generalized) (M62.81);Difficulty in walking, not elsewhere classified (R26.2)     Time: 5015-8682 PT Time Calculation (min) (ACUTE ONLY): 19 min  Charges:  $Gait Training: 8-22 mins                     [place] Pager [phone] Office Edith Endave 08/13/2018, 12:11 PM

## 2018-08-13 NOTE — Progress Notes (Signed)
PROGRESS NOTE    Yolanda Huffman  YIR:[id number] DOB: 02-28-1941 DOA: 08/09/2018 PCP: Eulas Post, MD    Brief Narrative:  78 year old female who presented with dyspnea. She does have a significant past medical history for systolic heart failure, atrial fibrillation, chronic kidney disease stage IV, hypertension, and type 2 diabetes mellitus. She presented with worsening dyspnea and generalized weakness. Recent hospitalization due to lower gastrointestinal bleed, received 2 units packed red blood cells and her diuretics were held. 24 hours prior to hospitalization he was seen by her primary care provider who restarted diuretics. Symptoms were progressive despite oral diuretics, noted about 12 pound weight gain and increased lower extremity edema. Significant decrease functional physical capacity. Physical examination she was in respiratory distress, respiratory rate was 23, heart rate 73, blood pressure 160/69, oxygen saturation 97% on supplemental oxygen, heart S1-S2 present and rhythmic, lungs with bibasilar rales, no wheezing or rhonchi, abdomen soft nontender, positive lower extremity edema.37, potassium 3.9, chloride 110, bicarb 16, glucose 76, BUN 38, creatinine 2.8, BNP 1621, white count 6.9, hemoglobin 9.9, hematocrit 32.8, platelets 211,urinalysis had 11-20 white cells.Her chest x-ray had cardiomegaly, small bilateral pleural effusions and increased hilar vascular congestion. Her EKG had a right axis deviation with ventricular pacing.  Patient was admitted to the hospital with a working diagnosis of acute decompensation of heart failure   Assessment & Plan:   Principal Problem:   Acute CHF (congestive heart failure) (HCC) Active Problems:   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 (HCC)   Hyperlipidemia   Coronary atherosclerosis   ATRIAL FIBRILLATION   Cardiac pacemaker in situ   Chronic anticoagulation   Long term (current) use of anticoagulants   Chronic  combined systolic and diastolic CHF (congestive heart failure) (HCC)   Lesion of right lung   1. Acute on chronic dyastolic heart failure decompensation.Left ventricle with ejection fraction 45 to 50%. Clinically improved volume status, will continue diuresis with po furosemide in am, continue b blockade with metoprolol.   2. AKI on CKD stage IV.Serum cr down to 3,15 with now severe hypokalemia, will continue K correction with oral Kcl, 80 meq in 2 divided doses and will follow on renal panel in am. Will continue to hold on furosemide for now, avoid hypotension and nephrotoxic medications. Continue with calcitriol for mineral bone disease.   3. Chronic atrial fibrillation.Continue rate control with metoprolol. On hold anticoagulation due to Hx of GI bleeding.  4. T2DM.Fasting glucose is 114 will continue with basal insulin with levemir 12 units, and insulin sliding scale for glucose cover and monitoring.   5. Breast cancer.Onanastrazole.   DVT prophylaxis:scd  Code Status:full Family Communication:no family at the bedside   Disposition Plan/ discharge barriers:pending K correction.  Body mass index is 25.25 kg/m. Malnutrition Type:   Body mass index is 24.64 kg/m. Malnutrition Type:      Malnutrition Characteristics:      Nutrition Interventions:     RN Pressure Injury Documentation:     Consultants:     Procedures:     Antimicrobials:       Subjective: Patient continue to improve volume status and edema, no chest pain, no nausea or vomiting. Her K is very low this am.   Objective: Vitals:   08/13/18 0434 08/13/18 0437 08/13/18 0847 08/13/18 1150  BP:  (!) 152/60 130/63 125/72  Pulse:  73 70 71  Resp:  16  18  Temp:  98.6 F (37 C)  98.2 F (36.8 C)  TempSrc:  Oral  Oral  SpO2:  100%  99%  Weight: 64.1 kg     Height:        Intake/Output Summary (Last 24 hours) at 08/13/2018 1420 Last data filed at 08/13/2018 0600 Gross  per 24 hour  Intake 723 ml  Output 2250 ml  Net -1527 ml   Filed Weights   08/11/18 0024 08/12/18 0541 08/13/18 0434  Weight: 69.3 kg 65.7 kg 64.1 kg    Examination:   General: Not in pain or dyspnea  Neurology: Awake and alert, non focal  E ENT: mild pallor, no icterus, oral mucosa moist Cardiovascular: No JVD. S1-S2 present, rhythmic, no gallops, rubs, or murmurs. Trace lower extremity edema. Pulmonary: positive breath sounds bilaterally, adequate air movement, no wheezing, rhonchi or rales. Gastrointestinal. Abdomen with no organomegaly, non tender, no rebound or guarding Skin. No rashes Musculoskeletal: no joint deformities     Data Reviewed: I have personally reviewed following labs and imaging studies  CBC: Recent Labs  Lab 08/08/18 1227 08/09/18 1151  WBC 6.4 6.9  NEUTROABS 4.9 5.6  HGB 9.8* 9.9*  HCT 29.5* 32.8*  MCV 90.4 97.6  PLT 219.0 010   Basic Metabolic Panel: Recent Labs  Lab 08/09/18 1151 08/10/18 0231 08/11/18 0657 08/12/18 0621 08/13/18 0609  NA 137 138 137 137 135  K 3.9 4.5 2.8* 3.6 2.6*  CL 110 111 106 102 94*  CO2 16* 17* 21* 29 27  GLUCOSE 76 118* 88 110* 114*  BUN 38* 42* 44* 47* 50*  CREATININE 2.81* 3.02* 3.03* 3.29* 3.15*  CALCIUM 9.7 9.6 9.9 10.7* 11.1*   GFR: Estimated Creatinine Clearance: 12.5 mL/min (A) (by C-G formula based on SCr of 3.15 mg/dL (H)). Liver Function Tests: No results for input(s): AST, ALT, ALKPHOS, BILITOT, PROT, ALBUMIN in the last 168 hours. No results for input(s): LIPASE, AMYLASE in the last 168 hours. No results for input(s): AMMONIA in the last 168 hours. Coagulation Profile: No results for input(s): INR, PROTIME in the last 168 hours. Cardiac Enzymes: No results for input(s): CKTOTAL, CKMB, CKMBINDEX, TROPONINI in the last 168 hours. BNP (last 3 results) Recent Labs    08/08/18 1227  PROBNP 1,399.0*   HbA1C: No results for input(s): HGBA1C in the last 72 hours. CBG: Recent Labs  Lab  08/09/18 2312 08/10/18 2245 08/11/18 2120  GLUCAP 80 148* 157*   Lipid Profile: No results for input(s): CHOL, HDL, LDLCALC, TRIG, CHOLHDL, LDLDIRECT in the last 72 hours. Thyroid Function Tests: No results for input(s): TSH, T4TOTAL, FREET4, T3FREE, THYROIDAB in the last 72 hours. Anemia Panel: No results for input(s): VITAMINB12, FOLATE, FERRITIN, TIBC, IRON, RETICCTPCT in the last 72 hours.    Radiology Studies: I have reviewed all of the imaging during this hospital visit personally     Scheduled Meds: . anastrozole  1 mg Oral Daily  . atorvastatin  40 mg Oral q1800  . calcitRIOL  0.25 mcg Oral Daily  . calcium carbonate  1 tablet Oral Once per day on Sun Tue Thu Sat  . feeding supplement (GLUCERNA SHAKE)  237 mL Oral BID BM  . insulin detemir  12 Units Subcutaneous QHS  . metoprolol succinate  50 mg Oral Daily   And  . metoprolol succinate  25 mg Oral QHS  . multivitamin with minerals  1 tablet Oral Daily  . omega-3 acid ethyl esters  1 g Oral BID  . pantoprazole  40 mg Oral Daily  . sodium chloride flush  3  mL Intravenous Q12H   Continuous Infusions: . sodium chloride 250 mL (08/11/18 0924)     LOS: 4 days        Aamari Strawderman Gerome Apley, MD

## 2018-08-14 LAB — BASIC METABOLIC PANEL
Anion gap: 11 (ref 5–15)
BUN: 57 mg/dL — ABNORMAL HIGH (ref 8–23)
CO2: 26 mmol/L (ref 22–32)
CREATININE: 3.26 mg/dL — AB (ref 0.44–1.00)
Calcium: 11.3 mg/dL — ABNORMAL HIGH (ref 8.9–10.3)
Chloride: 97 mmol/L — ABNORMAL LOW (ref 98–111)
GFR calc Af Amer: 15 mL/min — ABNORMAL LOW (ref >60)
GFR, EST NON AFRICAN AMERICAN: 13 mL/min — AB (ref >60)
Glucose, Bld: 113 mg/dL — ABNORMAL HIGH (ref 70–99)
Potassium: 3.8 mmol/L (ref 3.5–5.1)
Sodium: 134 mmol/L — ABNORMAL LOW (ref 135–145)

## 2018-08-14 NOTE — Plan of Care (Signed)

## 2018-08-14 NOTE — Progress Notes (Signed)
Patient ready for discharge. 

## 2018-08-14 NOTE — Plan of Care (Signed)
  Problem: Education: Goal: Knowledge of General Education information will improve Description Including pain rating scale, medication(s)/side effects and non-pharmacologic comfort measures Outcome: Adequate for Discharge   Problem: Health Behavior/Discharge Planning: Goal: Ability to manage health-related needs will improve Outcome: Adequate for Discharge   

## 2018-08-14 NOTE — Discharge Summary (Signed)
Physician Discharge Summary  Yolanda Huffman UVO:[id number] DOB: 10-15-1940 DOA: 08/09/2018  PCP: Eulas Post, MD  Admit date: 08/09/2018 Discharge date: 08/14/2018  Admitted From: Home  Disposition:  Home   Recommendations for Outpatient Follow-up and new medication changes:  1. Follow up with Dr. Elease Hashimoto in 7 days.  2. Patient was advised to take an extra furosemide in case of weight gain 3 lbs in 24 H, or 5 lbs in 7 days.  3. Continue metolazone Monday, Wednesday and Friday.  4. Follow renal panel in 7 days.   Home Health: Yes   Equipment/Devices: No    Discharge Condition: stable  CODE STATUS: full  Diet recommendation: heart healthy   Brief/Interim Summary: 78 year old female who presented with dyspnea. She does have a significant past medical history for systolic heart failure, atrial fibrillation, chronic kidney disease stage IV, hypertension, and type 2 diabetes mellitus. She presented with worsening dyspnea and generalized weakness. Recent hospitalization due to lower gastrointestinal bleed, received 2 units packed red blood cells and her diuretics were held. 24 hours prior to hospitalization he was seen by her primary care provider who restarted diuretics. Symptoms were progressive despite oral diuretics, noted about 12 pound weight gain and increased lower extremity edema. Significant decrease functional physical capacity. On her initialphysical examination she was in respiratory distress, respiratory rate was 23, heart rate 73, blood pressure 160/69, oxygen saturation 97% on supplemental oxygen, heart S1-S2 present and rhythmic, lungs with bibasilar rales, no wheezing or rhonchi, abdomen soft nontender, positive lower extremity edema.Na 137, potassium 3.9, chloride 110, bicarb 16, glucose 76, BUN 38, creatinine 2.8, BNP 1621, white count 6.9, hemoglobin 9.9, hematocrit 32.8, platelets 211,urinalysis had 11-20 white cells.Her chest x-ray had cardiomegaly, small  bilateral pleural effusions and increased hilar vascular congestion. Her EKG had a right axis deviation with ventricular pacing.  Patient was admitted to the hospital with a working diagnosis of acute decompensation of heart failure  1.  Acute on chronic diastolic heart failure decompensation, left ventricle ejection fraction 45 to 50%.  Patient was admitted to the cardiac telemetry unit, she received aggressive diuresis with IV furosemide, negative fluid balance was achieved (-7,157 ml since admission), with significant improvement of her symptoms.  Patient will continue metoprolol for heart failure management, she will resume po furosemide 40 mg daily and metolazone Monday, Wednesday and Friday.  She has been advised to take extra tablet of furosemide in case weight gain 3 pounds in 24 hours or 5 pounds in 7 days.  2.  Acute kidney injury on chronic kidney disease stage IV, with hypokalemia.  Patient received aggressive diuresis with IV furosemide, her peak creatinine was 3.29, at discharge down to 3.26.  Potassium was corrected with potassium chloride, discharge potassium 3.8.  Her base creatinine seems to be around 3.  Will need close follow-up as an outpatient on her kidney function and electrolytes. Continue calcitriol for metabolic bone disease.   3.  Chronic atrial fibrillation.  Patient remained on a V paced rhythm, continue metoprolol for rate control.  Currently off anticoagulation due to recent gastrointestinal bleed.  Follow-up as an outpatient.  4.  Type 2 diabetes mellitus.  Patient continue basal insulin with Levemir 12 units daily, with well-controlled glucose.  Her fasting glucose on the day of discharge is 113.  5.  Breast cancer.  Patient continue anastrozole, follow-up as an outpatient.  Discharge Diagnoses:  Principal Problem:   Acute CHF (congestive heart failure) (HCC) Active Problems:   Type 2 DM  with CKD stage 4 and hypertension (HCC)   Hyperlipidemia   Coronary  atherosclerosis   ATRIAL FIBRILLATION   Cardiac pacemaker in situ   Chronic anticoagulation   Long term (current) use of anticoagulants   Chronic combined systolic and diastolic CHF (congestive heart failure) (HCC)   Lesion of right lung    Discharge Instructions   Allergies as of 08/14/2018      Reactions   Amlodipine Besylate Swelling   edema      Medication List    STOP taking these medications   warfarin 5 MG tablet Commonly known as:  COUMADIN     TAKE these medications   anastrozole 1 MG tablet Commonly known as:  ARIMIDEX TAKE 1 TABLET BY MOUTH DAILY   atorvastatin 40 MG tablet Commonly known as:  LIPITOR TAKE ONE TABLET BY MOUTH DAILY What changed:  when to take this   calcitRIOL 0.25 MCG capsule Commonly known as:  ROCALTROL Take 0.25 mcg by mouth daily.   calcium carbonate 500 MG chewable tablet Commonly known as:  TUMS - dosed in mg elemental calcium Chew 1 tablet by mouth See admin instructions. Tuesday, Thursday, Saturday and Sunday   Cranberry 500 MG Caps Take 500 mg by mouth daily with lunch.   denosumab 60 MG/ML Sosy injection Commonly known as:  PROLIA Inject 60 mg into the skin every 6 (six) months.   feeding supplement (GLUCERNA SHAKE) Liqd Take 237 mLs by mouth 2 (two) times daily between meals. What changed:  additional instructions   Fish Oil 1000 MG Caps Take 1,000 mg by mouth 2 (two) times daily.   furosemide 40 MG tablet Commonly known as:  LASIX Take 40 mg by mouth 2 (two) times daily. Take 40 mg tablets each AM and one at lunch   glucose blood test strip 1 each by Other route 3 (three) times daily. Use as instructed   Insulin Detemir 100 UNIT/ML Pen Commonly known as:  LEVEMIR FLEXTOUCH Inject 12 Units into the skin daily. What changed:  when to take this   metolazone 2.5 MG tablet Commonly known as:  ZAROXOLYN Take 2.5 mg by mouth every Monday, Wednesday, and Friday.   metoprolol succinate 25 MG 24 hr  tablet Commonly known as:  TOPROL-XL Take 1-2 tablets (25-50 mg total) by mouth See admin instructions. TAKE TWO TABLET BY MOUTH EVERY MORNING AND THEN ONE TABLET BY MOUTH EVERY EVENING What changed:  additional instructions   multivitamin with minerals Tabs tablet Take 1 tablet by mouth daily. FOR ADULT 50+   pantoprazole 40 MG tablet Commonly known as:  PROTONIX TAKE ONE TABLET BY MOUTH DAILY   potassium chloride SA 20 MEQ tablet Commonly known as:  K-DUR,KLOR-CON Take 40 mEq by mouth 2 (two) times daily. In the morning and at lunch      Follow-up Information    Home, [facility] Follow up.   Specialty:  Home Health Services Why:  They will do your home health care at your home Contact information: [address] [place] [address] [phone]          Allergies  Allergen Reactions  . Amlodipine Besylate Swelling    edema    Consultations:     Procedures/Studies: Dg Chest 2 View  Result Date: 08/09/2018 CLINICAL DATA:  Shortness of breath.  Generalized weakness. EXAM: CHEST - 2 VIEW COMPARISON:  Chest radiograph 07/22/2017 FINDINGS: Multi lead pacer apparatus overlies the left hemithorax. Monitoring leads overlie the patient. Stable cardiomegaly.  Aortic atherosclerosis. Iinterval development of bilateral lower lung consolidative opacities. Right upper lung opacities. Small bilateral pleural effusions. Thoracic spine degenerative changes. IMPRESSION: New small bilateral pleural effusions with underlying opacities which may represent atelectasis or infection. Additionally, there is suggestion of consolidation within the medial right upper lung. Recommend attention on short-term follow-up chest radiograph to ensure resolution and exclude the possibility of underlying lesion. Cardiomegaly. Electronically Signed   By: Lovey Newcomer M.D.   On: 08/09/2018 12:27       Subjective: Patient is feeling better, dyspnea and lower extremity edema have improved. No  chest pain, no nausea or vomiting.   Discharge Exam: Vitals:   08/14/18 0800 08/14/18 0904  BP: (!) 147/62 (!) 116/44  Pulse: 72 70  Resp: 16   Temp: 98.4 F (36.9 C)   SpO2: 100%    Vitals:   08/13/18 2114 08/14/18 0454 08/14/18 0800 08/14/18 0904  BP: 132/64 133/86 (!) 147/62 (!) 116/44  Pulse: 74 69 72 70  Resp:  18 16   Temp:  98 F (36.7 C) 98.4 F (36.9 C)   TempSrc:   Oral   SpO2:  99% 100%   Weight:  65.9 kg    Height:        General: Not in pain or dyspnea.  Neurology: Awake and alert, non focal  E ENT: mild pallor, no icterus, oral mucosa moist Cardiovascular: No JVD. S1-S2 present, rhythmic, no gallops, rubs, or murmurs. No lower extremity edema. Pulmonary: positive breath sounds bilaterally, adequate air movement, no wheezing, rhonchi or rales. Gastrointestinal. Abdomen with no organomegaly, non tender, no rebound or guarding Skin. No rashes Musculoskeletal: no joint deformities   The results of significant diagnostics from this hospitalization (including imaging, microbiology, ancillary and laboratory) are listed below for reference.     Microbiology: No results found for this or any previous visit (from the past 240 hour(s)).   Labs: BNP (last 3 results) Recent Labs    08/09/18 1151  BNP 1,950.9*   Basic Metabolic Panel: Recent Labs  Lab 08/11/18 0657 08/12/18 0621 08/13/18 0609 08/13/18 1610 08/14/18 0526  NA 137 137 135 135 134*  K 2.8* 3.6 2.6* 3.5 3.8  CL 106 102 94* 93* 97*  CO2 21* 29 27 27 26   GLUCOSE 88 110* 114* 137* 113*  BUN 44* 47* 50* 52* 57*  CREATININE 3.03* 3.29* 3.15* 3.15* 3.26*  CALCIUM 9.9 10.7* 11.1* 11.3* 11.3*   Liver Function Tests: No results for input(s): AST, ALT, ALKPHOS, BILITOT, PROT, ALBUMIN in the last 168 hours. No results for input(s): LIPASE, AMYLASE in the last 168 hours. No results for input(s): AMMONIA in the last 168 hours. CBC: Recent Labs  Lab 08/08/18 1227 08/09/18 1151  WBC 6.4 6.9   NEUTROABS 4.9 5.6  HGB 9.8* 9.9*  HCT 29.5* 32.8*  MCV 90.4 97.6  PLT 219.0 211   Cardiac Enzymes: No results for input(s): CKTOTAL, CKMB, CKMBINDEX, TROPONINI in the last 168 hours. BNP: Invalid input(s): POCBNP CBG: Recent Labs  Lab 08/09/18 2312 08/10/18 2245 08/11/18 2120  GLUCAP 80 148* 157*   D-Dimer No results for input(s): DDIMER in the last 72 hours. Hgb A1c No results for input(s): HGBA1C in the last 72 hours. Lipid Profile No results for input(s): CHOL, HDL, LDLCALC, TRIG, CHOLHDL, LDLDIRECT in the last 72 hours. Thyroid function studies No results for input(s): TSH, T4TOTAL, T3FREE, THYROIDAB in the last 72 hours.  Invalid input(s): FREET3 Anemia work up No results for input(s): VITAMINB12, FOLATE,  FERRITIN, TIBC, IRON, RETICCTPCT in the last 72 hours. Urinalysis    Component Value Date/Time   COLORURINE YELLOW 08/09/2018 2130   APPEARANCEUR CLEAR 08/09/2018 2130   LABSPEC 1.008 08/09/2018 2130   PHURINE 5.0 08/09/2018 2130   GLUCOSEU NEGATIVE 08/09/2018 2130   GLUCOSEU NEGATIVE 07/01/2011 1203   HGBUR SMALL (A) 08/09/2018 2130   BILIRUBINUR NEGATIVE 08/09/2018 2130   BILIRUBINUR neg 05/02/2013 1215   KETONESUR NEGATIVE 08/09/2018 2130   PROTEINUR NEGATIVE 08/09/2018 2130   UROBILINOGEN 0.2 05/02/2013 1215   UROBILINOGEN 0.2 07/01/2011 1203   NITRITE NEGATIVE 08/09/2018 2130   LEUKOCYTESUR SMALL (A) 08/09/2018 2130   Sepsis Labs Invalid input(s): PROCALCITONIN,  WBC,  LACTICIDVEN Microbiology No results found for this or any previous visit (from the past 240 hour(s)).   Time coordinating discharge: 45 minutes  SIGNED:   Tawni Millers, MD  [facility] 08/14/2018, 9:50 AM

## 2018-08-16 ENCOUNTER — Ambulatory Visit (INDEPENDENT_AMBULATORY_CARE_PROVIDER_SITE_OTHER)
Admission: RE | Admit: 2018-08-16 | Discharge: 2018-08-16 | Disposition: A | Discharge status: Home / Self Care | Payer: Medicare Other | Source: Ambulatory Visit | Attending: Family Medicine | Admitting: Family Medicine

## 2018-08-16 DIAGNOSIS — M818 Other osteoporosis without current pathological fracture: Secondary | ICD-10-CM

## 2018-08-17 ENCOUNTER — Telehealth: Payer: Self-pay | Admitting: *Deleted

## 2018-08-17 NOTE — Telephone Encounter (Signed)
Transition Care Management Follow-up Telephone Call   Date discharged? 08/14/2018   How have you been since you were released from the hospital? "I still slow moving i'm doing better than what I was I'm now able to get into the shower now    Do you understand why you were in the hospital? Yes." I had weak legs"   Do you understand the discharge instructions? Yes    Where were you discharged to? Home    Items Reviewed:  Medications reviewed: YES on everything except for Warfarin   Allergies reviewed:  Yes   Dietary changes reviewed: YES no salt   Referrals reviewed: Yes    Functional Questionnaire:   Activities of Daily Living (ADLs):   She states they are independent in the following: ambulation, bathing and hygiene, feeding, continence, grooming, toileting and dressing States they require assistance with the following: N/A   Any transportation issues/concerns?: No    Any patient concerns? no   Confirmed importance and date/time of follow-up visits scheduled yes  Provider Appointment booked with Dr. Elease Hashimoto 08/21/2018   Confirmed with patient if condition begins to worsen call PCP or go to the ER.  Patient was given the office number and encouraged to call back with question or concerns.  : yes

## 2018-08-20 ENCOUNTER — Ambulatory Visit (INDEPENDENT_AMBULATORY_CARE_PROVIDER_SITE_OTHER): Payer: Medicare Other | Admitting: *Deleted

## 2018-08-20 ENCOUNTER — Telehealth: Payer: Self-pay | Admitting: Family Medicine

## 2018-08-20 DIAGNOSIS — I495 Sick sinus syndrome: Secondary | ICD-10-CM | POA: Diagnosis not present

## 2018-08-20 NOTE — Telephone Encounter (Signed)
Please advise 

## 2018-08-20 NOTE — Telephone Encounter (Signed)
Lynetta Mare and gave him verbal orders OK per Dr. Elease Hashimoto. Cindee Salt verbalized an understanding.

## 2018-08-20 NOTE — Telephone Encounter (Signed)
OK to proceed with orders as requested.   

## 2018-08-20 NOTE — Telephone Encounter (Signed)
Copied from [place] [phone]. Topic: Quick Communication - Home Health Verbal Orders >> Aug 20, 2018  4:19 PM Bea Graff, NT wrote: Caller/Agency: [facility] Number: [phone] Requesting OT/PT/Skilled Nursing/Social Work: PT  Frequency: 2 times a week for 3 weeks

## 2018-08-21 ENCOUNTER — Ambulatory Visit: Payer: Medicare Other | Admitting: Family Medicine

## 2018-08-21 ENCOUNTER — Encounter: Payer: Self-pay | Admitting: Family Medicine

## 2018-08-21 ENCOUNTER — Other Ambulatory Visit: Payer: Self-pay

## 2018-08-21 VITALS — BP 112/68 | HR 81 | Temp 97.6°F | Ht 60.25 in | Wt 142.8 lb

## 2018-08-21 DIAGNOSIS — I5041 Acute combined systolic (congestive) and diastolic (congestive) heart failure: Secondary | ICD-10-CM | POA: Diagnosis not present

## 2018-08-21 DIAGNOSIS — I10 Essential (primary) hypertension: Secondary | ICD-10-CM

## 2018-08-21 DIAGNOSIS — E1122 Type 2 diabetes mellitus with diabetic chronic kidney disease: Secondary | ICD-10-CM

## 2018-08-21 DIAGNOSIS — I4821 Permanent atrial fibrillation: Secondary | ICD-10-CM | POA: Diagnosis not present

## 2018-08-21 DIAGNOSIS — N184 Chronic kidney disease, stage 4 (severe): Secondary | ICD-10-CM

## 2018-08-21 DIAGNOSIS — K922 Gastrointestinal hemorrhage, unspecified: Secondary | ICD-10-CM

## 2018-08-21 DIAGNOSIS — I129 Hypertensive chronic kidney disease with stage 1 through stage 4 chronic kidney disease, or unspecified chronic kidney disease: Secondary | ICD-10-CM

## 2018-08-21 NOTE — Patient Instructions (Signed)
Weigh daily and be in touch for weight gain of 3 pounds in one day or 5 pounds in one week   Try to keep sodium intake < 2,000 mg daily.

## 2018-08-21 NOTE — Progress Notes (Signed)
Subjective:     Patient ID: Yolanda Huffman, female   DOB: [birth year], 78 y.o.   MRN: [id number]  HPI Patient seen for hospital follow-up.  She had presented here with some volume overload.  We started back diuretics but she still progressed with symptoms and was admitted the next day.  Refer to recent dictation from 05/29/2019 for recent details  "Patient seen with some increased weight gain and increased shortness of breath of 1 day duration.  Refer to recent dictation of 07/30/2018 for details.  Recent admission for lower GI bleed in a patient on Coumadin with elevated INR probably related to recent fluconazole which she was taking for Candida esophagitis.  She received transfusion of 1 unit of packed red blood cells.  At discharge, her furosemide and metolazone were held.  She had some mild orthopnea last night.  Mild dyspnea with exertion.  11 pound weight gain.  Had been taking furosemide 40 mg 3 daily.  She remains off Coumadin at this time.  She has pending capsule endoscopy study with GI.  She has not had any other episodes of recent blood in stools.  Denies any recent chest pain.  No cough.  No fever.  Most recent hemoglobin 9.8."  She was admitted and had BNP level of over 1600.  She was admitted to cardiac telemetry unit and had aggressive diuresis with IV furosemide.  Her weight is down about 15 pounds today.  She was continued on metoprolol.  She was continued on furosemide 40 mg twice daily and metolazone Monday Wednesday Friday which is the routine she had been on prior to her recent GI bleed.  She did have one low potassium and this was replete at follow-up.  She has scheduled follow-up labs tomorrow with her nephrologist  History of chronic atrial fibrillation.  Her Coumadin had been recently held because of GI bleeding.  She has pending capsule endoscopy study in March.  She has not seen any recent bloody stools.  No dizziness.  Overall feels near back to baseline  She is doing  daily weights.  Home weight is stable.  No orthopnea.  Past Medical History:  Diagnosis Date  . Anemia   . Arthritis    gout  . Atrial fibrillation ([place])    prior Sotalol - d/c'd 2/2 increased creatinine; rate control strategy  . CAD (coronary artery disease)    s/p PCI in [place] in 1/09  . Cancer [facility])    left breast  . Chronic combined systolic and diastolic heart failure (HCC)    Echocardiogram 3/12: Mild LVH, EF 29-79%, normal diastolic function, mild AI, mild MR, PASP 44, normal wall motion  . CKD (chronic kidney disease)    sees Dr. Burman Foster - Stage 3 ?  Marland Kitchen Diabetes mellitus   . Hyperlipemia   . Hypertension   . MI, old    2009  . Osteoporosis   . Pacemaker   . Pleural effusion   . Pneumonia   . PUD (peptic ulcer disease) 10/2010   duodenal ulcer   Past Surgical History:  Procedure Laterality Date  . BREAST BIOPSY Left 07/29/2016  . BREAST LUMPECTOMY Left 09/19/2016  . BREAST LUMPECTOMY WITH RADIOACTIVE SEED LOCALIZATION Left 09/19/2016   Procedure: LEFT BREAST LUMPECTOMY WITH RADIOACTIVE SEED LOCALIZATION;  Surgeon: Autumn Messing III, MD;  Location: [place];  Service: General;  Laterality: Left;  . CATARACT EXTRACTION Bilateral    01/12/09 and 01/26/09 both eyes  . COLONOSCOPY    . CORONARY ANGIOPLASTY  2009  . PACEMAKER INSERTION  07/13/07  . stent implant  07/13/2007  . TOTAL HIP ARTHROPLASTY Right 07/23/2017   Procedure: TOTAL HIP ARTHROPLASTY ANTERIOR APPROACH;  Surgeon: Mcarthur Rossetti, MD;  Location: [place];  Service: Orthopedics;  Laterality: Right;    reports that she quit smoking about 26 years ago. Her smoking use included cigarettes. She has a 40.00 pack-year smoking history. She has never used smokeless tobacco. She reports that she does not drink alcohol or use drugs. family history includes Breast cancer (age of onset: 86) in an other family member; Breast cancer (age of onset: 24) in her maternal aunt; Breast cancer (age of onset: 38) in her sister;  Clotting disorder in her father; Heart attack in her father; Heart disease in her father and mother; Hypertension in her father. Allergies  Allergen Reactions  . Amlodipine Besylate Swelling    edema      Review of Systems  Constitutional: Negative for fatigue.  Eyes: Negative for visual disturbance.  Respiratory: Negative for cough, chest tightness, shortness of breath and wheezing.   Cardiovascular: Negative for chest pain, palpitations and leg swelling.  Endocrine: Negative for polydipsia and polyuria.  Neurological: Negative for dizziness, seizures, syncope, weakness, light-headedness and headaches.       Objective:   Physical Exam Constitutional:      Appearance: She is well-developed.  Eyes:     Pupils: Pupils are equal, round, and reactive to light.  Neck:     Musculoskeletal: Neck supple.     Thyroid: No thyromegaly.     Vascular: No JVD.  Cardiovascular:     Rate and Rhythm: Normal rate and regular rhythm.     Heart sounds: No gallop.   Pulmonary:     Effort: Pulmonary effort is normal. No respiratory distress.     Breath sounds: Normal breath sounds. No wheezing or rales.  Musculoskeletal:     Right lower leg: No edema.     Left lower leg: No edema.  Neurological:     Mental Status: She is alert.        Assessment:     #1 recent acute on chronic diastolic heart failure-improved following IV diuresis and appears to be back to baseline  #2 chronic kidney disease followed by nephrology  #3 chronic atrial fibrillation.  Rate controlled.  Appears to be regular rhythm today.  Currently off Coumadin- secondary to recent GI bleed in setting of over-anticoagulation.  #4 type 2 diabetes which has been well controlled on low-dose insulin  #5 recent GI bleed with exact location and etiology undetermined.  Pending capsule endoscopy study.  Questionable AVMs.  Recent hemoglobin stable at 9.9  #6 history of anemia of chronic disease  #7 history of breast cancer     Plan:     -Continue current diuretic regimen. -Continue daily weights and be in touch for weight gain of 3 pounds in a day or 5 pounds in a week -Watch sodium intake with goal less than 2000 mg daily -After capsule endoscopy study if no site for blood loss located consider starting back Coumadin. -we elected to not get labs today as she is scheduled to get with nephrology tomorrow.  Eulas Post MD [facility] at [facility]

## 2018-08-22 LAB — CUP PACEART REMOTE DEVICE CHECK
Battery Impedance: 6553 Ohm
Battery Remaining Longevity: 1 mo
Battery Voltage: 2.62 V
Brady Statistic RV Percent Paced: 99 %
Date Time Interrogation Session: 20200224103628
Implantable Lead Implant Date: 20090116
Implantable Lead Implant Date: 20090116
Implantable Lead Location: 753859
Implantable Lead Model: 5076
Implantable Lead Model: 5076
Implantable Pulse Generator Implant Date: 20090116
Lead Channel Impedance Value: 475 Ohm
Lead Channel Pacing Threshold Amplitude: 1.375 V
Lead Channel Pacing Threshold Pulse Width: 0.4 ms
Lead Channel Setting Pacing Amplitude: 2.75 V
Lead Channel Setting Pacing Pulse Width: 0.4 ms
Lead Channel Setting Sensing Sensitivity: 2 mV
MDC IDC LEAD LOCATION: 753860
MDC IDC MSMT LEADCHNL RA IMPEDANCE VALUE: 67 Ohm

## 2018-08-28 ENCOUNTER — Encounter: Payer: Self-pay | Admitting: Cardiology

## 2018-08-28 NOTE — Progress Notes (Signed)
Remote pacemaker transmission.   

## 2018-08-31 ENCOUNTER — Other Ambulatory Visit: Payer: Self-pay | Admitting: Family Medicine

## 2018-08-31 NOTE — Telephone Encounter (Signed)
Is patient still taking this? Looks like has been filled several times from Dr. Crissie Sickles.  Not sure if you are filling or Dr. Lovena Le

## 2018-09-02 NOTE — Telephone Encounter (Signed)
I don't recall that we have been filling this.  If this has been through cardiology would continue with them.

## 2018-09-03 ENCOUNTER — Other Ambulatory Visit: Payer: Self-pay

## 2018-09-04 ENCOUNTER — Ambulatory Visit: Payer: Medicare Other | Admitting: Cardiology

## 2018-09-04 VITALS — BP 135/66 | HR 77 | Ht 63.0 in | Wt 143.4 lb

## 2018-09-04 DIAGNOSIS — Z7189 Other specified counseling: Secondary | ICD-10-CM

## 2018-09-04 DIAGNOSIS — Z712 Person consulting for explanation of examination or test findings: Secondary | ICD-10-CM

## 2018-09-04 DIAGNOSIS — I4821 Permanent atrial fibrillation: Secondary | ICD-10-CM

## 2018-09-04 DIAGNOSIS — I5042 Chronic combined systolic (congestive) and diastolic (congestive) heart failure: Secondary | ICD-10-CM

## 2018-09-04 DIAGNOSIS — I340 Nonrheumatic mitral (valve) insufficiency: Secondary | ICD-10-CM

## 2018-09-04 NOTE — Progress Notes (Signed)
Cardiology Office Note:    Date:  09/04/2018   ID:  Yolanda Huffman, DOB 08-24-1940, MRN [id number]  PCP:  Eulas Post, MD  Cardiologist:  Buford Dresser, MD PhD EP: Dr. Lovena Le  Referring MD: Eulas Post, MD   CC: establish care, review test results  History of Present Illness:    Yolanda Huffman is a 78 y.o. 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  She has been followed by Dr. Lovena Le for her pacemaker but has not had a general cardiologist before as far as she can remember. She had an echocardiogram in the fall to evaluate her function given high RV pacing burden. The test showed EF of 45-50% with septal dyskinesis, moderate AR without AS, mod-severe MR, severe TR with PASP 52 mmHg. Dr. Lovena Le recommend she be seen by a general cardiologist to determine if/when TEE indicated.  She was recently discharged on 08/14/18 after presenting with weight gain, LE edema and dyspnea. Prior to presentation, she had been hospitalized for GI bleed, and her diuretic had been held. Between that discharge and her readmission, she noted an increase in her fluid and weight gain of 12 lbs. It was thought to be due to a combination of heart failure and chronic kidney disease exacerbated by holding her chronic diuretics.   Since her discharge, her breathing is at baseline. Weights have been stable, and she has had no chest pain, PND, orthopnea, major LE edema. No limiting shortness of breath for the exertion she wants to do.   She had coumadin stopped during her admission for GI bleed requiring transfusion 06/2018. On presentation, her INR was >10, and her hemoglobin had  dropped from 12 to 7.3 in less than a week. She was reversed and transfused 2 units. She had recently been prescribed fluconazole for candidal esophagitis, and this was thought to be the cause of her elevated INR. She incremented appropriately post transfusion and did not have additional bleeding. It was suspected that she had a diverticular bleed, and she was recommended for capsule endoscopy as an outpatient. She was recommended to remain off of anticoagulation.  Overall her questions are focused on when she needs her generator changed. It was noted to be ERI. I will defer this to Dr. Lovena Le.   We discussed the results of her echo at length, see discussion below.   Past Medical History:  Diagnosis Date  . Anemia   . Arthritis    gout  . Atrial fibrillation ([place])    prior Sotalol - d/c'd 2/2 increased creatinine; rate control strategy  . CAD (coronary artery disease)    s/p PCI in [place] in 1/09  . [facility])    left breast  . Chronic combined systolic and diastolic heart failure (HCC)    Echocardiogram 3/12: Mild LVH, EF 41-93%, normal diastolic function, mild AI, mild MR, PASP 44, normal wall motion  . CKD (chronic kidney disease)    sees Dr. Burman Foster - Stage 3 ?  Marland Kitchen Diabetes mellitus   . Hyperlipemia   . Hypertension   . MI, old    2009  . Osteoporosis   . Pacemaker   . Pleural effusion   .  Pneumonia   . PUD (peptic ulcer disease) 10/2010   duodenal ulcer    Past Surgical History:  Procedure Laterality Date  . BREAST BIOPSY Left 07/29/2016  . BREAST LUMPECTOMY Left 09/19/2016  . BREAST LUMPECTOMY WITH RADIOACTIVE SEED LOCALIZATION Left 09/19/2016   Procedure: LEFT BREAST LUMPECTOMY WITH RADIOACTIVE SEED LOCALIZATION;  Surgeon: Autumn Messing III, MD;  Location: [facility];  Service: General;  Laterality: Left;  . CATARACT EXTRACTION Bilateral    01/12/09 and 01/26/09 both eyes  . COLONOSCOPY    . CORONARY ANGIOPLASTY  2009  . PACEMAKER INSERTION  07/13/07  . stent implant   07/13/2007  . TOTAL HIP ARTHROPLASTY Right 07/23/2017   Procedure: TOTAL HIP ARTHROPLASTY ANTERIOR APPROACH;  Surgeon: Mcarthur Rossetti, MD;  Location: [facility];  Service: Orthopedics;  Laterality: Right;    Current Medications: Current Outpatient Medications on File Prior to Visit  Medication Sig  . anastrozole (ARIMIDEX) 1 MG tablet TAKE 1 TABLET BY MOUTH DAILY (Patient taking differently: Take 1 mg by mouth daily. )  . atorvastatin (LIPITOR) 40 MG tablet TAKE ONE TABLET BY MOUTH DAILY (Patient taking differently: Take 40 mg by mouth daily at 6 PM. )  . calcitRIOL (ROCALTROL) 0.25 MCG capsule Take 0.25 mcg by mouth daily.   . calcium carbonate (TUMS - DOSED IN MG ELEMENTAL CALCIUM) 500 MG chewable tablet Chew 1 tablet by mouth See admin instructions. Tuesday, Thursday, Saturday and Sunday  . Cranberry 500 MG CAPS Take 500 mg by mouth daily with lunch.  . denosumab (PROLIA) 60 MG/ML SOSY injection Inject 60 mg into the skin every 6 (six) months.  . feeding supplement, GLUCERNA SHAKE, (GLUCERNA SHAKE) LIQD Take 237 mLs by mouth 2 (two) times daily between meals. (Patient taking differently: Take 237 mLs by mouth 2 (two) times daily between meals. Ensure)  . furosemide (LASIX) 40 MG tablet Take 40 mg by mouth 2 (two) times daily. Take 40 mg tablets each AM and one at lunch  . glucose blood test strip 1 each by Other route 3 (three) times daily. Use as instructed   . Insulin Detemir (LEVEMIR FLEXTOUCH) 100 UNIT/ML Pen Inject 12 Units into the skin daily. (Patient taking differently: Inject 12 Units into the skin at bedtime. )  . metolazone (ZAROXOLYN) 2.5 MG tablet Take 2.5 mg by mouth every Monday, Wednesday, and Friday.  . metoprolol succinate (TOPROL-XL) 25 MG 24 hr tablet Take 1-2 tablets (25-50 mg total) by mouth See admin instructions. TAKE TWO TABLET BY MOUTH EVERY MORNING AND THEN ONE TABLET BY MOUTH EVERY EVENING (Patient taking differently: Take 25-50 mg by mouth See admin instructions.  Take 50 mg in the morning and 25 mg in the evening)  . Multiple Vitamin (MULTIVITAMIN WITH MINERALS) TABS tablet Take 1 tablet by mouth daily. FOR ADULT 50+  . Omega-3 Fatty Acids (FISH OIL) 1000 MG CAPS Take 1,000 mg by mouth 2 (two) times daily.  . pantoprazole (PROTONIX) 40 MG tablet TAKE ONE TABLET BY MOUTH DAILY (Patient taking differently: Take 40 mg by mouth daily. )  . potassium chloride SA (K-DUR,KLOR-CON) 20 MEQ tablet Take 40 mEq by mouth 2 (two) times daily. In the morning and at lunch   No current facility-administered medications on file prior to visit.      Allergies:   Amlodipine besylate   Social History   Socioeconomic History  . Marital status: Widowed    Spouse name: Not on file  . Number of children: 1  . Years of education:  Not on file  . Highest education level: Not on file  Occupational History  . Occupation: retired Theatre stage manager: RETIRED  Social Needs  . Financial resource strain: Not on file  . Food insecurity:    Worry: Not on file    Inability: Not on file  . Transportation needs:    Medical: Not on file    Non-medical: Not on file  Tobacco Use  . Smoking status: Former Smoker    Packs/day: 2.00    Years: 20.00    Pack years: 40.00    Types: Cigarettes    Last attempt to quit: 11/26/1991    Years since quitting: 26.7  . Smokeless tobacco: Never Used  Substance and Sexual Activity  . Alcohol use: No  . Drug use: No  . Sexual activity: Not on file  Lifestyle  . Physical activity:    Days per week: Not on file    Minutes per session: Not on file  . Stress: Not on file  Relationships  . Social connections:    Talks on phone: Not on file    Gets together: Not on file    Attends religious service: Not on file    Active member of club or organization: Not on file    Attends meetings of clubs or organizations: Not on file    Relationship status: Not on file  Other Topics Concern  . Not on file  Social History Narrative   Reg  exercise           Family History: The patient's family history includes Breast cancer (age of onset: 24) in an other family member; Breast cancer (age of onset: 4) in her maternal aunt; Breast cancer (age of onset: 56) in her sister; Clotting disorder in her father; Heart attack in her father; Heart disease in her father and mother; Hypertension in her father. There is no history of Colon cancer.  ROS:   Please see the history of present illness.  Additional pertinent ROS:  Constitutional: Negative for chills, fever, night sweats, unintentional weight loss  HENT: Negative for ear pain and hearing loss.   Eyes: Negative for loss of vision and eye pain.  Respiratory: Negative for cough, sputum, shortness of breath, wheezing.   Cardiovascular: See HPI. Gastrointestinal: Negative for abdominal pain, melena, and hematochezia.  Genitourinary: Negative for dysuria and hematuria.  Musculoskeletal: Negative for falls and myalgias.  Skin: Negative for itching and rash.  Neurological: Negative for focal weakness, focal sensory changes and loss of consciousness.  Endo/Heme/Allergies: Does not bruise/bleed easily.    EKGs/Labs/Other Studies Reviewed:    The following studies were reviewed today: Echo 04/05/18 - Left ventricle: The cavity size was normal. Systolic function was   mildly reduced. The estimated ejection fraction was in the range   of 45% to 50%. Dysynchronous contraction of the ventricular   septum with paradoxical motion likely secondary to pacing. There   were no other regional wall motion abnormalities. The study is   not technically sufficient to allow evaluation of LV diastolic   function. - Aortic valve: A bicuspid morphology cannot be excluded   (congenital bicuspid with right and left cusp fusion and   disproportionately larger non coronary cusp vs. acquired   functional bicuspid with calcific sclerotic fusion); severe   leaflet sclerosis. Valve mobility was  restricted. Transvalvular   velocity was within the normal range. There was no stenosis.   There was at least moderate regurgitation, eccentric, directed  toward the anterior mitral leaflet with evidence of mitral   leaflet fluttering on m-mode. Mean gradient (S): 6 mm Hg. - Aorta: Dilated aortic root - Aortic root dimension: 41.7 mm. - Mitral valve: There was moderate to severe regurgitation. - Left atrium: The atrium was severely dilated. - Pulmonary veins: Possible systolic reversal in the right lower   pulmonary vein that was interrogated, though Doppler signal is   somewhat indeterminate. - Right ventricle: The cavity size was moderately dilated. Wall   thickness was normal. Pacer wire or catheter noted in right   ventricle. Systolic function was mildly reduced. - Right atrium: The atrium was moderately to severely dilated. - Tricuspid valve: There was severe regurgitation. - Pulmonary arteries: PA peak pressure: 52 mm Hg (S). - Inferior vena cava: The vessel was dilated. The respirophasic   diameter changes were blunted (< 50%), consistent with elevated   central venous pressure. - Hepatic veins: Prominent systolic flow reversal noted. - Pericardium, extracardiac: There was no pericardial effusion.  Recommendations:  Recommend transesophageal echocardiogram to better evaluate severity of multi-valvular heart disease including aortic valve (congenital bicuspid R-L fusion vs. acquired functional bicuspid, with regurgitation), tricuspid valve in setting of pacemaker lead, and mitral valve (PISA quantitation may be underestimating severity due to mitral leaflet thickening).  EKG:  EKG is personally reviewed.  The ekg ordered today demonstrates ventricular-paced rhythm, no clear p waves, consistent with atrial fibrillation.  Recent Labs: 07/24/2018: ALT 15 08/08/2018: Pro B Natriuretic peptide (BNP) 1,399.0 08/09/2018: B Natriuretic Peptide 1,621.7; Hemoglobin 9.9; Platelets  211 08/14/2018: BUN 57; Creatinine, Ser 3.26; Potassium 3.8; Sodium 134  Recent Lipid Panel    Component Value Date/Time   CHOL 164 12/13/2017 1009   TRIG 109.0 12/13/2017 1009   HDL 59.20 12/13/2017 1009   CHOLHDL 3 12/13/2017 1009   VLDL 21.8 12/13/2017 1009   LDLCALC 83 12/13/2017 1009   LDLDIRECT 89.2 06/03/2010 0946    Physical Exam:    VS:  BP 135/66   Pulse 77   Ht 5\' 3"  (1.6 m)   Wt 143 lb 6.4 oz (65 kg)   BMI 25.40 kg/m     Wt Readings from Last 3 Encounters:  09/04/18 143 lb 6.4 oz (65 kg)  08/21/18 142 lb 12.8 oz (64.8 kg)  08/14/18 145 lb 3.2 oz (65.9 kg)     GEN: Well nourished, well developed in no acute distress HEENT: Normal NECK: No JVD; No carotid bruits LYMPHATICS: No lymphadenopathy CARDIAC: regular rhythm, normal S1 and S2, no rubs, gallops. 2/6 HSM. Radial and DP pulses 2+ bilaterally. RESPIRATORY:  Clear to auscultation without rales, wheezing or rhonchi  ABDOMEN: Soft, non-tender, non-distended MUSCULOSKELETAL:  No edema; No deformity  SKIN: Warm and dry NEUROLOGIC:  Alert and oriented x 3 PSYCHIATRIC:  Normal affect   ASSESSMENT:    1. Encounter to discuss test results   2. Mitral valve insufficiency, unspecified etiology   3. Permanent atrial fibrillation   4. Chronic combined systolic and diastolic heart failure ([place])   5. Encounter for education about heart failure   6. Counseling on health promotion and disease prevention    PLAN:    Mitral regurgitation, moderate to severe, with severe TR: reviewed results of echo extensively. With the exception of her recent admission (which was likely precipitated by holding her diuretic), she has not had any issues with volume overload, and she has no symptoms/limitations for the activity she wants to do. We discussed how we evaluate valves more closely with  TEE. Discussed indications for treatment of valve disease. -after extensive discussion and shared decision making, she wishes to pursue gen  change with possible BiV upgrade to see if improvement in synchrony improves both her EF and MR. I think this is a prudent plan. She is not significantly symptomatic, and if her dyssynchrony improves, the functional component of her MR may improve as well.  -I will send my note to Dr. Lovena Le. She is not scheduled for a follow up visit, but her generator is at [facility]. She wants to make sure that this gets replaced before it runs out. Counseled her that it is being watched closely. -I would repeat a TTE several months after PM upgrade/change to see if EF and valve regurgitation have improved. If not, and/or if her symptoms progress, would then consider TEE for further evaluation.  Recently reduced EF on echo, with history of diastolic heart failure, now concerning for chronic systolic and diastolic heart failure: discussed echo as above -given CKD stage, not a candidate for ACEi, ARB, ARNI, or MRA -already on metoprolol succinate -would recommend BiV upgrade, as above, if Dr. Lovena Le agrees.  Permanent atrial fibrillation -CHA2DS2/VAS Stroke Risk Points  =  7 -haven't found source of bleeding yet, awaiting capsule study -I would hold on restarting anticoagulation until capsule results return. Based on my review of the chart, I suspect that the fluconazole made her INR supratherapeutic, resulting in a spontaneous GI bleed. Dr. Lovena Le has managed her anticoagulation in the past. If no active bleeding seen, would consider restarting anticoagulation. While coumadin is not ideal, her severe chronic kidney disease limits alternatives.    Cardiac risk counseling and prevention recommendations: -recommend heart healthy/Mediterranean diet, with whole grains, fruits, vegetable, fish, lean meats, nuts, and olive oil. Limit salt. -recommend moderate walking, 3-5 times/week for 30-50 minutes each session. Aim for at least 150 minutes.week. Goal should be pace of 3 miles/hours, or walking 1.5 miles in 30  minutes -recommend avoidance of tobacco products. Avoid excess alcohol. -Additional risk factor control:  -Diabetes: A1c is 5.9, on insulin  -Lipids: LDL 83, TG 109, on atorvastatin  -Blood pressure control: slightly elevated today but near goal of <130/80. Predominantly managed by diuretics.  -Weight: BMI 25 -ASCVD risk score: The 10-year ASCVD risk score Mikey Bussing DC Brooke Bonito., et al., 2013) is: 51.7%   Values used to calculate the score:     Age: 72 years     Sex: Female     Is Non-Hispanic African American: No     Diabetic: Yes     Tobacco smoker: No     Systolic Blood Pressure: 782 mmHg     Is BP treated: Yes     HDL Cholesterol: 59.2 mg/dL     Total Cholesterol: 164 mg/dL    Plan for follow up: 4 mos, ideally several months after gen change and possible upgrade to device  Medication Adjustments/Labs and Tests Ordered: Current medicines are reviewed at length with the patient today.  Concerns regarding medicines are outlined above.  Orders Placed This Encounter  Procedures  . EKG 12-Lead  . ECHOCARDIOGRAM COMPLETE   No orders of the defined types were placed in this encounter.   Patient Instructions  Medication Instructions:  Your Physician recommend you continue on your current medication as directed.    If you need a refill on your cardiac medications before your next appointment, please call your pharmacy.   Lab work: None  Testing/Procedures: Your physician has requested that you have an echocardiogram  in 4 months. Echocardiography is a painless test that uses sound waves to create images of your heart. It provides your doctor with information about the size and shape of your heart and how well your heart's chambers and valves are working. This procedure takes approximately one hour. There are no restrictions for this procedure. [place] [address]   Follow-Up: At [facility], you and your health needs are our priority.  As part of our continuing mission  to provide you with exceptional heart care, we have created designated Provider Care Teams.  These Care Teams include your primary Cardiologist (physician) and Advanced Practice Providers (APPs -  Physician Assistants and Nurse Practitioners) who all work together to provide you with the care you need, when you need it. You will need a follow up appointment in 4 months.  Please call our office 2 months in advance to schedule this appointment.  You may see Dr. Harrell Gave or one of the following Advanced Practice Providers on your designated Care Team:   Rosaria Ferries, PA-C . Jory Sims, DNP, ANP       Signed, Buford Dresser, MD PhD 09/04/2018 3:45 PM    [place]

## 2018-09-04 NOTE — Patient Instructions (Signed)
Medication Instructions:  Your Physician recommend you continue on your current medication as directed.    If you need a refill on your cardiac medications before your next appointment, please call your pharmacy.   Lab work: None  Testing/Procedures: Your physician has requested that you have an echocardiogram in 4 months. Echocardiography is a painless test that uses sound waves to create images of your heart. It provides your doctor with information about the size and shape of your heart and how well your heart's chambers and valves are working. This procedure takes approximately one hour. There are no restrictions for this procedure. [place] [address]   Follow-Up: At [facility], you and your health needs are our priority.  As part of our continuing mission to provide you with exceptional heart care, we have created designated Provider Care Teams.  These Care Teams include your primary Cardiologist (physician) and Advanced Practice Providers (APPs -  Physician Assistants and Nurse Practitioners) who all work together to provide you with the care you need, when you need it. You will need a follow up appointment in 4 months.  Please call our office 2 months in advance to schedule this appointment.  You may see Dr. Harrell Gave or one of the following Advanced Practice Providers on your designated Care Team:   Rosaria Ferries, PA-C . Jory Sims, DNP, ANP

## 2018-09-05 LAB — BASIC METABOLIC PANEL
Creatinine: 2.7 — AB (ref 0.5–1.1)
Glucose: 116
Potassium: 4 (ref 3.4–5.3)
Sodium: 135 — AB (ref 137–147)

## 2018-09-07 ENCOUNTER — Encounter: Payer: Self-pay | Admitting: Cardiology

## 2018-09-11 ENCOUNTER — Ambulatory Visit (INDEPENDENT_AMBULATORY_CARE_PROVIDER_SITE_OTHER): Payer: Medicare Other | Admitting: Gastroenterology

## 2018-09-11 ENCOUNTER — Ambulatory Visit: Payer: Self-pay | Admitting: General Practice

## 2018-09-11 ENCOUNTER — Other Ambulatory Visit: Payer: Self-pay

## 2018-09-11 ENCOUNTER — Encounter: Payer: Self-pay | Admitting: Physician Assistant

## 2018-09-11 DIAGNOSIS — D500 Iron deficiency anemia secondary to blood loss (chronic): Secondary | ICD-10-CM | POA: Diagnosis not present

## 2018-09-11 NOTE — Progress Notes (Signed)
Patient here for capsule endoscopy. Tolerated procedure. Verbalizes understanding of written and verbal instructions. SN [id number] EXP 2017-09-15

## 2018-09-11 NOTE — Patient Instructions (Signed)

## 2018-09-13 ENCOUNTER — Telehealth: Payer: Self-pay | Admitting: Cardiology

## 2018-09-13 MED ORDER — METOLAZONE 2.5 MG PO TABS: 2.5000 mg | ORAL_TABLET | ORAL | 1 refills | Status: DC

## 2018-09-13 NOTE — Telephone Encounter (Signed)
Author phoned pt. To notify need to contact Dr. Tanna Furry office to have [place] filled per history. After lengthy explanation, pt. Stated she would do so. No other medication needs at this time.

## 2018-09-13 NOTE — Telephone Encounter (Signed)
Rx(s) sent to pharmacy electronically. Patient has been notified directly and voiced understanding.  

## 2018-09-13 NOTE — Telephone Encounter (Signed)
° ° °*  STAT* If patient is at the pharmacy, call can be transferred to refill team.   1. Which medications need to be refilled? (please list name of each medication and dose if known) metolazone (ZAROXOLYN) 2.5 MG tablet  2. Which pharmacy/location (including street and city if local pharmacy) is medication to be sent to? [place], [place] [address]  3. Do they need a 30 day or 90 day supply? [place]

## 2018-09-17 ENCOUNTER — Other Ambulatory Visit: Payer: Self-pay | Admitting: Family Medicine

## 2018-09-17 ENCOUNTER — Encounter: Payer: Self-pay | Admitting: Family Medicine

## 2018-09-18 ENCOUNTER — Other Ambulatory Visit: Payer: Self-pay

## 2018-09-18 MED ORDER — INSULIN PEN NEEDLE 32G X 4 MM MISC: 1 refills | Status: DC

## 2018-09-25 ENCOUNTER — Telehealth: Payer: Self-pay

## 2018-09-25 NOTE — Telephone Encounter (Signed)
Reviewed the results of her capsule endoscopy. Positive for AVM's. No bleeding at this time.  She is instructed to continue her iron replacement, repeat CBC in 2 months, then serially.  Office follow up in 4 months. Recall put into Epic.

## 2018-09-28 ENCOUNTER — Ambulatory Visit (INDEPENDENT_AMBULATORY_CARE_PROVIDER_SITE_OTHER): Payer: Medicare Other | Admitting: *Deleted

## 2018-09-28 ENCOUNTER — Other Ambulatory Visit: Payer: Self-pay

## 2018-09-28 DIAGNOSIS — I495 Sick sinus syndrome: Secondary | ICD-10-CM

## 2018-09-28 DIAGNOSIS — I4821 Permanent atrial fibrillation: Secondary | ICD-10-CM

## 2018-09-29 LAB — CUP PACEART REMOTE DEVICE CHECK
Battery Impedance: 7552 Ohm
Battery Remaining Longevity: 1 mo — CL
Battery Voltage: 2.6 V
Brady Statistic RV Percent Paced: 99 %
Date Time Interrogation Session: 20200403112332
Implantable Lead Implant Date: 20090116
Implantable Lead Implant Date: 20090116
Implantable Lead Location: 753859
Implantable Lead Location: 753860
Implantable Lead Model: 5076
Implantable Lead Model: 5076
Implantable Pulse Generator Implant Date: 20090116
Lead Channel Impedance Value: 429 Ohm
Lead Channel Impedance Value: 67 Ohm
Lead Channel Pacing Threshold Amplitude: 1 V
Lead Channel Pacing Threshold Pulse Width: 0.4 ms
Lead Channel Setting Pacing Amplitude: 2.75 V
Lead Channel Setting Pacing Pulse Width: 0.4 ms
Lead Channel Setting Sensing Sensitivity: 2 mV

## 2018-10-04 ENCOUNTER — Encounter: Payer: Self-pay | Admitting: Family Medicine

## 2018-10-04 NOTE — Addendum Note (Signed)
Addended by: Douglass Rivers D on: 10/04/2018 05:15 PM   Modules accepted: Level of Service

## 2018-10-04 NOTE — Progress Notes (Signed)
Remote pacemaker transmission.   

## 2018-10-17 ENCOUNTER — Other Ambulatory Visit: Payer: Medicare Other

## 2018-10-17 ENCOUNTER — Ambulatory Visit: Payer: Medicare Other

## 2018-10-17 ENCOUNTER — Ambulatory Visit: Payer: Medicare Other | Admitting: Oncology

## 2018-10-29 ENCOUNTER — Ambulatory Visit (INDEPENDENT_AMBULATORY_CARE_PROVIDER_SITE_OTHER): Payer: Medicare Other | Admitting: *Deleted

## 2018-10-29 ENCOUNTER — Other Ambulatory Visit: Payer: Self-pay

## 2018-10-29 DIAGNOSIS — I495 Sick sinus syndrome: Secondary | ICD-10-CM

## 2018-10-30 ENCOUNTER — Telehealth: Payer: Self-pay | Admitting: Nurse Practitioner

## 2018-10-30 LAB — CUP PACEART REMOTE DEVICE CHECK
Battery Impedance: 8462 Ohm
Battery Voltage: 2.61 V
Brady Statistic RV Percent Paced: 99 %
Date Time Interrogation Session: 20200504105924
Implantable Lead Implant Date: 20090116
Implantable Lead Implant Date: 20090116
Implantable Lead Location: 753859
Implantable Lead Location: 753860
Implantable Lead Model: 5076
Implantable Lead Model: 5076
Implantable Pulse Generator Implant Date: 20090116
Lead Channel Impedance Value: 461 Ohm
Lead Channel Impedance Value: 67 Ohm
Lead Channel Setting Pacing Amplitude: 2.75 V
Lead Channel Setting Pacing Pulse Width: 0.4 ms
Lead Channel Setting Sensing Sensitivity: 2 mV

## 2018-10-30 NOTE — Telephone Encounter (Signed)
Carelink transmission reviewed. Pacemaker at [facility] since 10/17/18 with reversion to VVI 65.  Will send to Jenny/Dr Lovena Le to determine timing of gen change.   Chanetta Marshall, NP 10/30/2018 10:25 AM

## 2018-11-05 ENCOUNTER — Encounter: Payer: Self-pay | Admitting: Cardiology

## 2018-11-05 NOTE — Addendum Note (Signed)
Addended by: Tiajuana Amass on: 11/05/2018 03:52 PM   Modules accepted: Level of Service

## 2018-11-05 NOTE — Progress Notes (Signed)
Remote pacemaker transmission.   

## 2018-11-12 ENCOUNTER — Other Ambulatory Visit: Payer: Self-pay

## 2018-11-12 ENCOUNTER — Telehealth: Payer: Self-pay

## 2018-11-12 DIAGNOSIS — D500 Iron deficiency anemia secondary to blood loss (chronic): Secondary | ICD-10-CM

## 2018-11-12 NOTE — Telephone Encounter (Signed)
-----   Message from Greggory Keen, LPN sent at 01/22/210  1:59 PM EDT ----- CBC due in late May  Due for a follow up appointment July

## 2018-11-12 NOTE — Telephone Encounter (Signed)
Spoke with the patient. She will be getting labs from the [facility] next week. She declines to come here also. Discussed the appointment. She declines to schedule due to the COVID 19 pandemic. She plans to wait for her lab results to decide when to come back to GI.

## 2018-11-13 ENCOUNTER — Telehealth: Payer: Self-pay

## 2018-11-13 NOTE — Telephone Encounter (Signed)
Call placed to Pt.  Need to schedule Pt for gen change.  Pt [facility] on 10/17/2018  Scheduled Pt for virtual visit with Dr. Lovena Le 11/14/2018 at 2:00 pm.  Tentative plan to do gen change 6/22.   Will call Pt daughter per Pt.

## 2018-11-13 NOTE — Telephone Encounter (Signed)
Call placed to Pt daughter.  Pt will go for covid testing on June 19 at 10 am.  Pt scheduled for procedure on June 22 at 1130 am.  Labs are being drawn by oncologist on Nov 22, 2018.

## 2018-11-14 ENCOUNTER — Other Ambulatory Visit: Payer: Self-pay

## 2018-11-14 ENCOUNTER — Telehealth (INDEPENDENT_AMBULATORY_CARE_PROVIDER_SITE_OTHER): Payer: Medicare Other | Admitting: Internal Medicine

## 2018-11-14 ENCOUNTER — Encounter: Payer: Self-pay | Admitting: Family Medicine

## 2018-11-14 DIAGNOSIS — I495 Sick sinus syndrome: Secondary | ICD-10-CM

## 2018-11-14 DIAGNOSIS — I4821 Permanent atrial fibrillation: Secondary | ICD-10-CM | POA: Diagnosis not present

## 2018-11-14 DIAGNOSIS — Z950 Presence of cardiac pacemaker: Secondary | ICD-10-CM | POA: Diagnosis not present

## 2018-11-14 NOTE — Telephone Encounter (Signed)
Work up complete.  All testing scheduled.  Mailing Pt instruction letter today.

## 2018-11-14 NOTE — Progress Notes (Signed)
Electrophysiology TeleHealth Note   Due to national recommendations of social distancing due to COVID 19, an audio/video telehealth visit is felt to be most appropriate for this patient at this time.  See MyChart message from today for the patient's consent to telehealth for [facility].   Date:  11/14/2018   ID:  Yolanda Huffman, DOB 12-10-1940, MRN [id number]  Location: patient's home  Provider location: [address], [place]  Evaluation Performed: Follow-up visit  PCP:  Eulas Post, MD  Cardiologist:  No primary care provider on file.  Electrophysiologist:  Dr Lovena Le  Chief Complaint:  " I'm doing ok."  History of Present Illness:    Yolanda Huffman is a 78 y.o. female who presents via audio/video conferencing for a telehealth visit today. She is a pleasant 78 yo woman with CHB, s/p PPM insertion. She has reached ERI and has reverted to VVI 65. Since last being seen in our clinic, the patient reports doing very well. The patient is otherwise without complaint today.  The patient denies symptoms of fevers, chills, cough, or new SOB worrisome for COVID 19.  Past Medical History:  Diagnosis Date  . Anemia   . Arthritis    gout  . Atrial fibrillation ([place])    prior Sotalol - d/c'd 2/2 increased creatinine; rate control strategy  . CAD (coronary artery disease)    s/p PCI in [place] in 1/09  . Cancer [facility])    left breast  . Chronic combined systolic and diastolic heart failure (HCC)    Echocardiogram 3/12: Mild LVH, EF 67-34%, normal diastolic function, mild AI, mild MR, PASP 44, normal wall motion  . CKD (chronic kidney disease)    sees Dr. Burman Foster - Stage 3 ?  Marland Kitchen Diabetes mellitus   . Hyperlipemia   . Hypertension   . MI, old    2009  . Osteoporosis   . Pacemaker   . Pleural effusion   . Pneumonia   . PUD (peptic ulcer disease) 10/2010   duodenal ulcer    Past Surgical History:  Procedure Laterality Date  . BREAST BIOPSY Left 07/29/2016  .  BREAST LUMPECTOMY Left 09/19/2016  . BREAST LUMPECTOMY WITH RADIOACTIVE SEED LOCALIZATION Left 09/19/2016   Procedure: LEFT BREAST LUMPECTOMY WITH RADIOACTIVE SEED LOCALIZATION;  Surgeon: Autumn Messing III, MD;  Location: [place];  Service: General;  Laterality: Left;  . CATARACT EXTRACTION Bilateral    01/12/09 and 01/26/09 both eyes  . COLONOSCOPY    . CORONARY ANGIOPLASTY  2009  . PACEMAKER INSERTION  07/13/07  . stent implant  07/13/2007  . TOTAL HIP ARTHROPLASTY Right 07/23/2017   Procedure: TOTAL HIP ARTHROPLASTY ANTERIOR APPROACH;  Surgeon: Mcarthur Rossetti, MD;  Location: [place];  Service: Orthopedics;  Laterality: Right;    Current Outpatient Medications  Medication Sig Dispense Refill  . anastrozole (ARIMIDEX) 1 MG tablet TAKE 1 TABLET BY MOUTH DAILY (Patient taking differently: Take 1 mg by mouth daily. ) 90 tablet 3  . atorvastatin (LIPITOR) 40 MG tablet TAKE ONE TABLET BY MOUTH DAILY 90 tablet 0  . calcitRIOL (ROCALTROL) 0.25 MCG capsule Take 0.25 mcg by mouth daily.     . calcium carbonate (TUMS - DOSED IN MG ELEMENTAL CALCIUM) 500 MG chewable tablet Chew 1 tablet by mouth See admin instructions. Tuesday, Thursday, Saturday and Sunday    . Cranberry 500 MG CAPS Take 500 mg by mouth daily with lunch.    . denosumab (PROLIA) 60 MG/ML  SOSY injection Inject 60 mg into the skin every 6 (six) months.    . feeding supplement, GLUCERNA SHAKE, (GLUCERNA SHAKE) LIQD Take 237 mLs by mouth 2 (two) times daily between meals. (Patient taking differently: Take 237 mLs by mouth 2 (two) times daily between meals. Ensure)  0  . furosemide (LASIX) 40 MG tablet TAKE TWO TABLETS BY MOUTH EVERY MORNING AND TAKE TAKE ONE TABLET BY MOUTH EVERY EVENING 270 tablet 0  . glucose blood test strip 1 each by Other route 3 (three) times daily. Use as instructed     . Insulin Pen Needle (BD PEN NEEDLE NANO U/F) 32G X 4 MM MISC Use to inject Levemir one time daily. Dx Code E11.9 100 each 1  . LEVEMIR FLEXTOUCH 100  UNIT/ML Pen INJECT 12 UNITS INTO THE SKIN DAILY 15 mL 1  . metolazone (ZAROXOLYN) 2.5 MG tablet Take 1 tablet (2.5 mg total) by mouth every Monday, Wednesday, and Friday. 30 tablet 1  . metoprolol succinate (TOPROL-XL) 25 MG 24 hr tablet Take 1-2 tablets (25-50 mg total) by mouth See admin instructions. TAKE TWO TABLET BY MOUTH EVERY MORNING AND THEN ONE TABLET BY MOUTH EVERY EVENING (Patient taking differently: Take 25-50 mg by mouth See admin instructions. Take 50 mg in the morning and 25 mg in the evening) 90 tablet 3  . Multiple Vitamin (MULTIVITAMIN WITH MINERALS) TABS tablet Take 1 tablet by mouth daily. FOR ADULT 50+    . Omega-3 Fatty Acids (FISH OIL) 1000 MG CAPS Take 1,000 mg by mouth 2 (two) times daily.    . pantoprazole (PROTONIX) 40 MG tablet TAKE ONE TABLET BY MOUTH DAILY (Patient taking differently: Take 40 mg by mouth daily. ) 90 tablet 4  . potassium chloride SA (K-DUR,KLOR-CON) 20 MEQ tablet Take 40 mEq by mouth 2 (two) times daily. In the morning and at lunch     No current facility-administered medications for this visit.     Allergies:   Amlodipine besylate   Social History:  The patient  reports that she quit smoking about 26 years ago. Her smoking use included cigarettes. She has a 40.00 pack-year smoking history. She has never used smokeless tobacco. She reports that she does not drink alcohol or use drugs.   Family History:  The patient's  family history includes Breast cancer (age of onset: 35) in an other family member; Breast cancer (age of onset: 37) in her maternal aunt; Breast cancer (age of onset: 29) in her sister; Clotting disorder in her father; Heart attack in her father; Heart disease in her father and mother; Hypertension in her father.   ROS:  Please see the history of present illness.   All other systems are personally reviewed and negative.    Exam:    Vital Signs:  bp - 128/73, p -65   Labs/Other Tests and Data Reviewed:    Recent Labs:  07/24/2018: ALT 15 08/08/2018: Pro B Natriuretic peptide (BNP) 1,399.0 08/09/2018: B Natriuretic Peptide 1,621.7; Hemoglobin 9.9; Platelets 211 08/14/2018: BUN 57 09/05/2018: Creatinine 2.7; Potassium 4.0; Sodium 135   Wt Readings from Last 3 Encounters:  09/04/18 143 lb 6.4 oz (65 kg)  08/21/18 142 lb 12.8 oz (64.8 kg)  08/14/18 145 lb 3.2 oz (65.9 kg)     Other studies personally reviewed: Additional studies/ records that were reviewed today include: Last device remote is reviewed from [facility] PDF dated 10/29/18 which reveals normal device function, no arrhythmias    ASSESSMENT & PLAN:  1.  CHB - she is asymptomatic sp ppm. 2. PPM - she has reached ERI. She will undergo ppm gen change out. 3. Atrial fib - she is chronic. Her VR is well controlled. 4. COVID 19 screen The patient denies symptoms of COVID 19 at this time.  The importance of social distancing was discussed today.  Follow-up:  4 months Next remote: 6 months  Current medicines are reviewed at length with the patient today.   The patient does not have concerns regarding her medicines.  The following changes were made today:  none  Labs/ tests ordered today include: none No orders of the defined types were placed in this encounter.    Patient Risk:  after full review of this patients clinical status, I feel that they are at moderate risk at this time.  Today, I have spent 24 minutes with the patient with telehealth technology discussing all of above.    Signed, Cristopher Peru, MD  11/14/2018 1:49 PM     [facility] [address] [place] [address] [phone] (office) [phone] (fax)

## 2018-11-20 ENCOUNTER — Ambulatory Visit (INDEPENDENT_AMBULATORY_CARE_PROVIDER_SITE_OTHER): Payer: Medicare Other | Admitting: Family Medicine

## 2018-11-20 ENCOUNTER — Other Ambulatory Visit: Payer: Self-pay

## 2018-11-20 DIAGNOSIS — I5042 Chronic combined systolic (congestive) and diastolic (congestive) heart failure: Secondary | ICD-10-CM | POA: Diagnosis not present

## 2018-11-20 DIAGNOSIS — I129 Hypertensive chronic kidney disease with stage 1 through stage 4 chronic kidney disease, or unspecified chronic kidney disease: Secondary | ICD-10-CM

## 2018-11-20 DIAGNOSIS — I10 Essential (primary) hypertension: Secondary | ICD-10-CM

## 2018-11-20 DIAGNOSIS — N184 Chronic kidney disease, stage 4 (severe): Secondary | ICD-10-CM | POA: Diagnosis not present

## 2018-11-20 DIAGNOSIS — E1122 Type 2 diabetes mellitus with diabetic chronic kidney disease: Secondary | ICD-10-CM | POA: Diagnosis not present

## 2018-11-20 NOTE — Progress Notes (Signed)
Patient ID: Yolanda Huffman, female   DOB: July 30, 1940, 78 y.o.   MRN: [id number]  This visit type was conducted due to national recommendations for restrictions regarding the COVID-19 pandemic in an effort to limit this patient's exposure and mitigate transmission in our community.   Virtual Visit via Telephone Note  I connected with Yolanda Huffman on 11/20/18 at 10:30 AM EDT by telephone and verified that I am speaking with the correct person using two identifiers.   I discussed the limitations, risks, security and privacy concerns of performing an evaluation and management service by telephone and the availability of in person appointments. I also discussed with the patient that there may be a patient responsible charge related to this service. The patient expressed understanding and agreed to proceed.  Location patient: home Location provider: work or home office Participants present for the call: patient, provider Patient did not have a visit in the prior 7 days to address this/these issue(s).   History of Present Illness: Patient has multiple chronic problems including history of type 2 diabetes, chronic kidney disease, hypertension, CAD, atrial fibrillation, combined systolic and diastolic heart failure, history of lower GI bleed, osteoporosis, history of breast cancer, osteoporosis.  She is followed by cardiology.  She had to be taken off Coumadin several months ago because of lower GI bleed.  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  She remains off Coumadin at this time.  She is apparently preparing to get a new pacemaker.  Generally feels well overall.  No specific complaints today.  Medications reviewed.  Diabetes has been stable.  Last A1c was 5.9%.  Fasting blood sugar this morning 82 and this is typical for her.  She takes low-dose Levemir.  No recent hypoglycemia.  She states that her kidney function was "better "by recent labs through nephrology.  She  has seen no evidence for recurrent GI bleed  She has very supportive daughter who is providing her groceries and she is trying to stay quarantined as much as possible.   Observations/Objective: Patient sounds cheerful and well on the phone. I do not appreciate any SOB. Speech and thought processing are grossly intact. Patient reported vitals:  Assessment and Plan:  #1 type 2 diabetes stable.  She is due for follow-up A1c but would not bring back in office quite yet  #2 chronic kidney disease followed by nephrology  #3 history of atrial fibrillation.  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  She is getting Prolia injections through oncology  Follow Up Instructions:  - 6 months   [id number] [id number] [id number] 11-20 [id number] 21-30 I did not refer this patient for an OV in the next 24 hours for this/these issue(s).  I discussed the assessment and treatment plan with the patient. The patient was provided an opportunity to ask questions and all were answered. The patient agreed with the plan and demonstrated an understanding of the instructions.   The patient was advised to call back or seek an in-person evaluation if the symptoms worsen or if the condition fails to improve as anticipated.  I provided 26 minutes of non-face-to-face time during this encounter.   Carolann Littler, MD

## 2018-11-21 NOTE — Progress Notes (Signed)
[place]  [phone] [phone]) [phone]     ID: GREGORY DOWE DOB: [birth year]  MR#: [id number]  YIF#:[id number]  Patient Care Team: Eulas Post, MD as PCP - General (Family Medicine) Donato Heinz, MD as Consulting Physician (Nephrology) Takari Duncombe, Virgie Dad, MD as Consulting Physician (Oncology) Jovita Kussmaul, MD as Consulting Physician (General Surgery) Causey, Charlestine Massed, NP as Nurse Practitioner (Hematology and Oncology) Mcarthur Rossetti, MD as Consulting Physician (Orthopedic Surgery) Mauri Pole, MD as Consulting Physician (Gastroenterology) Evans Lance, MD as Consulting Physician (Cardiology) Chauncey Cruel, MD OTHER MD:   CHIEF COMPLAINT: Ductal carcinoma in situ   CURRENT TREATMENT: Anastrozole, denosumab/Prolia   INTERVAL HISTORY: Yolanda Huffman returns today for follow-up and treatment of her ductal carcinoma in situ.   She continues on anastrozole. She tolerates this well, without any noticeable side effects.    She also continues on denosumab/Prolia, with her most recent dose on 04/30/2018. She tolerates this well and without any noticeable side effects.     Since her last visit, she underwent bone density testing at [facility]. This showed a T-score of -2.9, which is considered osteoporotic and high risk for fracture. Prior exam on 08/25/2015 was -2.7.  She is scheduled for repeat mammogram on 12/11/2018. She is scheduled to see Dr. Marlou Starks in July 2020.   REVIEW OF SYSTEMS: Yolanda Huffman reports doing well overall. She cleans her home, watches TV, reads. Her daughter does the shopping. She is scheduled to have the battery in her pacemaker replaced on 12/17/2018.  Since her last visit, she was hospitalized on two separate occasions-- the first on 07/24/2018 for lower GI bleeding and the second on 08/09/2018 for volume overload. Chest x-ray from 08/09/2018 showed: new small bilateral pleural effusions with  underlying opacities which may represent atelectasis or infection; additionally, there is suggestion of consolidation within the medial right upper lung. Recommend attention on short-term follow-up chest radiograph to ensure resolution and exclude the possibility of underlying lesion; cardiomegaly.  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  The patient denies unusual headaches, visual changes, nausea, vomiting, stiff neck, dizziness, or gait imbalance. There has been no cough, phlegm production, or pleurisy, no chest pain or pressure, and no change in bowel or bladder habits. The patient denies fever, rash, bleeding, unexplained fatigue or unexplained weight loss. A detailed review of systems was otherwise entirely negative.   BREAST CANCER HISTORY: From the original intake note:  The patient had routine bilateral screening mammography with tomography at the [facility] 07/06/2016, showing new calcifications in the left breast. The right breast was unremarkable. Unilateral left diagnostic mammography 07/27/2016 found the breast density to be category B. In the upper inner quadrant of the left breast there was a 0.7 cm group of pleomorphic calcifications.  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  Her subsequent history is as detailed below   PAST MEDICAL HISTORY: Past Medical History:  Diagnosis Date  . Anemia   . Arthritis    gout  . Atrial fibrillation ([place])    prior Sotalol - d/c'd 2/2 increased creatinine; rate control strategy  . CAD (coronary artery disease)    s/p PCI in [place] in 1/09  . Cancer [facility])    left breast  . Chronic combined systolic and diastolic heart failure (HCC)    Echocardiogram 3/12: Mild  LVH, EF 18-56%, normal diastolic function, mild AI, mild MR, PASP 44, normal wall  motion  . CKD (chronic kidney disease)    sees Dr. Burman Foster - Stage 3 ?  Marland Kitchen Diabetes mellitus   . Hyperlipemia   . Hypertension   . MI, old    2009  . Osteoporosis   . Pacemaker   . Pleural effusion   . Pneumonia   . PUD (peptic ulcer disease) 10/2010   duodenal ulcer    PAST SURGICAL HISTORY: Past Surgical History:  Procedure Laterality Date  . BREAST BIOPSY Left 07/29/2016  . BREAST LUMPECTOMY Left 09/19/2016  . BREAST LUMPECTOMY WITH RADIOACTIVE SEED LOCALIZATION Left 09/19/2016   Procedure: LEFT BREAST LUMPECTOMY WITH RADIOACTIVE SEED LOCALIZATION;  Surgeon: Autumn Messing III, MD;  Location: [place];  Service: General;  Laterality: Left;  . CATARACT EXTRACTION Bilateral    01/12/09 and 01/26/09 both eyes  . COLONOSCOPY    . CORONARY ANGIOPLASTY  2009  . PACEMAKER INSERTION  07/13/07  . stent implant  07/13/2007  . TOTAL HIP ARTHROPLASTY Right 07/23/2017   Procedure: TOTAL HIP ARTHROPLASTY ANTERIOR APPROACH;  Surgeon: Mcarthur Rossetti, MD;  Location: [place];  Service: Orthopedics;  Laterality: Right;    FAMILY HISTORY Family History  Problem Relation Age of Onset  . Heart disease Father   . Heart attack Father   . Clotting disorder Father        blood clot  . Hypertension Father   . Heart disease Mother   . Breast cancer Maternal Aunt 36  . Breast cancer Sister 35  . Breast cancer Other 40  . Colon cancer Neg Hx    The patient's father died at age 25 from a myocardial infarction. The patient's mother died from multiple problems including pleurisy at the age of 92. The patient has 3 brothers, 2 sisters. One sister was diagnosed with breast cancer in her late 46s. The other sister had a "vaginal cancer" (? Cervical cancer). A maternal aunt was diagnosed with breast cancer in her 43s and her daughter, the patient's niece was diagnosed with breast cancer in her 36s.   GYNECOLOGIC HISTORY:  No LMP recorded. Patient is postmenopausal. Menarche age 87, first live birth age 54,  the patient is GX P1. She stopped having periods in 1992. She did not take hormone replacement. She took oral contraceptives briefly remotely with no complications.    SOCIAL HISTORY: (Updated 07/18/2018) She is originally from [place]. 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 She is a widow. She lives with her cat (adopted at 18 year old on 08/06/2014), Chrissy, in a retirement community. Her daughter Kristie Cowman lives in [place]. She is a Runner, broadcasting/film/video. The patient has no grandchildren. She is not a church attender    ADVANCED DIRECTIVES: The patient's daughter Alleen Borne is her healthcare power of attorney. Alleen Borne can be reached at [phone] or [phone]   HEALTH MAINTENANCE: Social History   Tobacco Use  . Smoking status: Former Smoker    Packs/day: 2.00    Years: 20.00    Pack years: 40.00    Types: Cigarettes    Last attempt to quit: 11/26/1991    Years since quitting: 27.0  . Smokeless tobacco: Never Used  Substance Use Topics  . Alcohol use: No  . Drug use: No     Colonoscopy:January 2017/ [facility]  PAP:  Bone density: March 2017 showed a T score of -2.7 (osteoporosis) right femoral neck   Allergies  Allergen Reactions  . Amlodipine Besylate Swelling    edema    Current Outpatient Medications  Medication Sig Dispense Refill  . anastrozole (ARIMIDEX) 1 MG tablet TAKE 1 TABLET BY MOUTH DAILY (Patient taking differently: Take 1 mg by mouth daily. ) 90 tablet 3  . atorvastatin (LIPITOR) 40 MG tablet TAKE ONE TABLET BY MOUTH DAILY 90 tablet 0  . calcium carbonate (TUMS - DOSED IN MG ELEMENTAL CALCIUM) 500 MG chewable tablet Chew 1 tablet by mouth See admin instructions. Tuesday, Thursday, Saturday and Sunday    . Cranberry 500 MG CAPS Take 500 mg by mouth daily with lunch.    . denosumab (PROLIA) 60 MG/ML SOSY injection Inject 60 mg into the skin every 6 (six) months.    . feeding supplement, GLUCERNA SHAKE, (GLUCERNA SHAKE) LIQD Take 237 mLs by mouth 2 (two) times  daily between meals. (Patient taking differently: Take 237 mLs by mouth 2 (two) times daily between meals. Ensure)  0  . furosemide (LASIX) 40 MG tablet TAKE TWO TABLETS BY MOUTH EVERY MORNING AND TAKE TAKE ONE TABLET BY MOUTH EVERY EVENING 270 tablet 0  . glucose blood test strip 1 each by Other route 3 (three) times daily. Use as instructed     . Insulin Pen Needle (BD PEN NEEDLE NANO U/F) 32G X 4 MM MISC Use to inject Levemir one time daily. Dx Code E11.9 100 each 1  . LEVEMIR FLEXTOUCH 100 UNIT/ML Pen INJECT 12 UNITS INTO THE SKIN DAILY 15 mL 1  . metolazone (ZAROXOLYN) 2.5 MG tablet Take 1 tablet (2.5 mg total) by mouth every Monday, Wednesday, and Friday. 30 tablet 1  . metoprolol succinate (TOPROL-XL) 25 MG 24 hr tablet Take 1-2 tablets (25-50 mg total) by mouth See admin instructions. TAKE TWO TABLET BY MOUTH EVERY MORNING AND THEN ONE TABLET BY MOUTH EVERY EVENING (Patient taking differently: Take 25-50 mg by mouth See admin instructions. Take 50 mg in the morning and 25 mg in the evening) 90 tablet 3  . Multiple Vitamin (MULTIVITAMIN WITH MINERALS) TABS tablet Take 1 tablet by mouth daily. FOR ADULT 50+    . Omega-3 Fatty Acids (FISH OIL) 1000 MG CAPS Take 1,000 mg by mouth 2 (two) times daily.    . pantoprazole (PROTONIX) 40 MG tablet TAKE ONE TABLET BY MOUTH DAILY (Patient taking differently: Take 40 mg by mouth daily. ) 90 tablet 4  . potassium chloride SA (K-DUR,KLOR-CON) 20 MEQ tablet Take 40 mEq by mouth 2 (two) times daily. In the morning and at lunch     No current facility-administered medications for this visit.     OBJECTIVE: Elderly white woman who appears stated age  19:   11/22/18 1050  BP: (!) 147/53  Pulse: 65  Resp: 18  Temp: 98.5 F (36.9 C)  SpO2: 100%     Body mass index is 25.69 kg/m.    ECOG FS:1 - Symptomatic but completely ambulatory  Sclerae unicteric, pupils round and equal Wearing a mask No cervical or supraclavicular adenopathy Lungs no rales  or rhonchi Heart regular rate and rhythm Abd soft, nontender, positive bowel sounds MSK no focal spinal tenderness, no upper extremity lymphedema Neuro: nonfocal, well oriented, appropriate affect Breasts: The right breast is benign.  The left breast has undergone lumpectomy.  There is no evidence of disease recurrence.  Both axillae are benign.  LAB RESULTS:  CMP     Component Value Date/Time   NA 135 (A) 09/05/2018   NA 134 (L)  04/18/2017 0950   K 4.0 09/05/2018   K 3.4 (L) 04/18/2017 0950   CL 97 (L) 08/14/2018 0526   CO2 26 08/14/2018 0526   CO2 26 04/18/2017 0950   GLUCOSE 113 (H) 08/14/2018 0526   GLUCOSE 139 04/18/2017 0950   BUN 57 (H) 08/14/2018 0526   BUN 42 (A) 04/26/2018   BUN 53.9 (H) 04/18/2017 0950   CREATININE 2.7 (A) 09/05/2018   CREATININE 3.26 (H) 08/14/2018 0526   CREATININE 2.4 (H) 04/18/2017 0950   CALCIUM 11.3 (H) 08/14/2018 0526   CALCIUM 9.1 04/18/2017 0950   PROT 6.2 (L) 07/24/2018 2213   PROT 7.4 04/18/2017 0950   ALBUMIN 3.2 (L) 07/24/2018 2213   ALBUMIN 3.4 (L) 04/18/2017 0950   AST 24 07/24/2018 2213   AST 28 04/18/2017 0950   ALT 15 07/24/2018 2213   ALT 18 04/18/2017 0950   ALKPHOS 27 (L) 07/24/2018 2213   ALKPHOS 42 04/18/2017 0950   BILITOT 0.6 07/24/2018 2213   BILITOT 0.55 04/18/2017 0950   GFRNONAA 13 (L) 08/14/2018 0526   GFRAA 15 (L) 08/14/2018 0526    INo results found for: SPEP, UPEP  Lab Results  Component Value Date   WBC 7.0 11/22/2018   NEUTROABS 4.9 11/22/2018   HGB 12.1 11/22/2018   HCT 37.3 11/22/2018   MCV 89.9 11/22/2018   PLT 194 11/22/2018      Chemistry      Component Value Date/Time   NA 135 (A) 09/05/2018   NA 134 (L) 04/18/2017 0950   K 4.0 09/05/2018   K 3.4 (L) 04/18/2017 0950   CL 97 (L) 08/14/2018 0526   CO2 26 08/14/2018 0526   CO2 26 04/18/2017 0950   BUN 57 (H) 08/14/2018 0526   BUN 42 (A) 04/26/2018   BUN 53.9 (H) 04/18/2017 0950   CREATININE 2.7 (A) 09/05/2018   CREATININE 3.26 (H)  08/14/2018 0526   CREATININE 2.4 (H) 04/18/2017 0950   GLU 116 09/05/2018      Component Value Date/Time   CALCIUM 11.3 (H) 08/14/2018 0526   CALCIUM 9.1 04/18/2017 0950   ALKPHOS 27 (L) 07/24/2018 2213   ALKPHOS 42 04/18/2017 0950   AST 24 07/24/2018 2213   AST 28 04/18/2017 0950   ALT 15 07/24/2018 2213   ALT 18 04/18/2017 0950   BILITOT 0.6 07/24/2018 2213   BILITOT 0.55 04/18/2017 0950       No results found for: LABCA2  No components found for: [id number]  No results for input(s): INR in the last 168 hours.  Urinalysis    Component Value Date/Time   COLORURINE YELLOW 08/09/2018 2130   APPEARANCEUR CLEAR 08/09/2018 2130   LABSPEC 1.008 08/09/2018 2130   PHURINE 5.0 08/09/2018 2130   GLUCOSEU NEGATIVE 08/09/2018 2130   GLUCOSEU NEGATIVE 07/01/2011 1203   HGBUR SMALL (A) 08/09/2018 2130   BILIRUBINUR NEGATIVE 08/09/2018 2130   BILIRUBINUR neg 05/02/2013 [place] 08/09/2018 2130   PROTEINUR NEGATIVE 08/09/2018 2130   UROBILINOGEN 0.2 05/02/2013 1215   UROBILINOGEN 0.2 07/01/2011 1203   NITRITE NEGATIVE 08/09/2018 2130   LEUKOCYTESUR SMALL (A) 08/09/2018 2130     STUDIES: No results found.    ELIGIBLE FOR AVAILABLE RESEARCH PROTOCOL: No  ASSESSMENT: 78 y.o. Yolanda Huffman woman status post left breast upper inner quadrant biopsy 07/29/2016 for ductal carcinoma in situ, grade 2, estrogen and progesterone receptor positive  (1) Genetics testing 08/23/2016 through the Multi-Gene Panel offered by [facility] found no deleterious mutations in ALK, APC,  ATM, AXIN2,BAP1,  BARD1, BLM, BMPR1A, BRCA1, BRCA2, BRIP1, CASR, CDC73, CDH1, CDK4, CDKN1B, CDKN1C, CDKN2A (p14ARF), CDKN2A (p16INK4a), CEBPA, CHEK2, DICER1, CIS3L2, EGFR (c.2369C>T, p.Thr790Met variant only), EPCAM (Deletion/duplication testing only), FH, FLCN, GATA2, GPC3, GREM1 (Promoter region deletion/duplication testing only), HOXB13 (c.251G>A, p.Gly84Glu), HRAS, KIT, MAX, MEN1, MET, MITF (c.952G>A,  p.Glu318Lys variant only), MLH1, MSH2, MSH6, MUTYH, NBN, NF1, NF2, PALB2, PDGFRA, PHOX2B, PMS2, POLD1, POLE, POT1, PRKAR1A, PTCH1, PTEN, RAD50, RAD51C, RAD51D, RB1, RECQL4, RET, RUNX1, SDHAF2, SDHA (sequence changes only), SDHB, SDHC, SDHD, SMAD4, SMARCA4, SMARCB1, SMARCE1, STK11, SUFU, TERT, TERT, TMEM127, TP53, TSC1, TSC2, VHL, WRN and WT1  (2) considered the COMET trial: patient declines  (3) left lumpectomy 09/19/2016 found ductal carcinoma in situ, grade 2, measuring 0.6 cm, with negative margins.  (4) advised against adjuvant radiation since there would be no survival advantage in this setting and the patient's pacemaker would have to be moved  (5) anastrozole started 09/27/2016  (a) bone density 08/25/2015 found a T score of -2.7, consistent with osteoporosis   (b) denosumab/Prolia started 10/25/2016, repeated every 24 weeks  (c) repeat bone density 08/16/2018 shows a T score of -2.9  (4) iron deficiency anemia  (a) status post Feraheme 05/01/2019  (b) EGD 07/03/2018 showed candidal esophagitis      PLAN: Yolanda Huffman is now just over 2 years out from definitive surgery for her breast cancer with no evidence of disease recurrence.  This is very favorable.  She is tolerating anastrozole with no significant side effects other than the concerns regarding her bone density.  The plan is to continue that for total of 5 years.  Her bone density does show a little bit more loss but I think the denosumab may be helping slow that down.  She will receive another dose today once we get the final lab results  She will return in 6 months for her next Prolia dose  She is scheduled for mammography in June and to see Dr. Marlou Starks in July.  She knows to call for any other problems that may develop before the next visit.  Jailan Trimm, Virgie Dad, MD  11/22/18 11:24 AM Medical Oncology and Hematology [facility] [address] [place], [place] [address] Tel. [phone]    Fax. [phone]    I, Wilburn Mylar, am acting as scribe for Dr. Virgie Dad. Race Latour.  I, Lurline Del MD, have reviewed the above documentation for accuracy and completeness, and I agree with the above.

## 2018-11-22 ENCOUNTER — Other Ambulatory Visit: Payer: Self-pay

## 2018-11-22 ENCOUNTER — Telehealth: Payer: Self-pay | Admitting: *Deleted

## 2018-11-22 ENCOUNTER — Inpatient Hospital Stay: Payer: Medicare Other

## 2018-11-22 ENCOUNTER — Inpatient Hospital Stay: Payer: Medicare Other | Attending: Oncology

## 2018-11-22 ENCOUNTER — Inpatient Hospital Stay (HOSPITAL_BASED_OUTPATIENT_CLINIC_OR_DEPARTMENT_OTHER): Payer: Medicare Other | Admitting: Oncology

## 2018-11-22 VITALS — BP 147/53 | HR 65 | Temp 98.5°F | Resp 18 | Ht 63.0 in | Wt 145.0 lb

## 2018-11-22 DIAGNOSIS — D509 Iron deficiency anemia, unspecified: Secondary | ICD-10-CM | POA: Insufficient documentation

## 2018-11-22 DIAGNOSIS — I5043 Acute on chronic combined systolic (congestive) and diastolic (congestive) heart failure: Secondary | ICD-10-CM

## 2018-11-22 DIAGNOSIS — M810 Age-related osteoporosis without current pathological fracture: Secondary | ICD-10-CM

## 2018-11-22 DIAGNOSIS — D0512 Intraductal carcinoma in situ of left breast: Secondary | ICD-10-CM | POA: Insufficient documentation

## 2018-11-22 DIAGNOSIS — Z87891 Personal history of nicotine dependence: Secondary | ICD-10-CM | POA: Diagnosis not present

## 2018-11-22 DIAGNOSIS — M8588 Other specified disorders of bone density and structure, other site: Secondary | ICD-10-CM

## 2018-11-22 DIAGNOSIS — M818 Other osteoporosis without current pathological fracture: Secondary | ICD-10-CM

## 2018-11-22 DIAGNOSIS — Z950 Presence of cardiac pacemaker: Secondary | ICD-10-CM

## 2018-11-22 LAB — CBC WITH DIFFERENTIAL/PLATELET
Abs Immature Granulocytes: 0.02 10*3/uL (ref 0.00–0.07)
Basophils Absolute: 0.1 10*3/uL (ref 0.0–0.1)
Basophils Relative: 1 %
Eosinophils Absolute: 0.1 10*3/uL (ref 0.0–0.5)
Eosinophils Relative: 2 %
HCT: 37.3 % (ref 36.0–46.0)
Hemoglobin: 12.1 g/dL (ref 12.0–15.0)
Immature Granulocytes: 0 %
Lymphocytes Relative: 19 %
Lymphs Abs: 1.3 10*3/uL (ref 0.7–4.0)
MCH: 29.2 pg (ref 26.0–34.0)
MCHC: 32.4 g/dL (ref 30.0–36.0)
MCV: 89.9 fL (ref 80.0–100.0)
Monocytes Absolute: 0.6 10*3/uL (ref 0.1–1.0)
Monocytes Relative: 8 %
Neutro Abs: 4.9 10*3/uL (ref 1.7–7.7)
Neutrophils Relative %: 70 %
Platelets: 194 10*3/uL (ref 150–400)
RBC: 4.15 MIL/uL (ref 3.87–5.11)
RDW: 14.7 % (ref 11.5–15.5)
WBC: 7 10*3/uL (ref 4.0–10.5)
nRBC: 0 % (ref 0.0–0.2)

## 2018-11-22 LAB — FERRITIN: Ferritin: 28 ng/mL (ref 11–307)

## 2018-11-22 LAB — IRON AND TIBC
Iron: 67 ug/dL (ref 41–142)
Saturation Ratios: 21 % (ref 21–57)
TIBC: 311 ug/dL (ref 236–444)
UIBC: 245 ug/dL (ref 120–384)

## 2018-11-22 LAB — CMP (CANCER CENTER ONLY)
ALT: 11 U/L (ref 0–44)
AST: 20 U/L (ref 15–41)
Albumin: 3.7 g/dL (ref 3.5–5.0)
Alkaline Phosphatase: 48 U/L (ref 38–126)
Anion gap: 14 (ref 5–15)
BUN: 61 mg/dL — ABNORMAL HIGH (ref 8–23)
CO2: 26 mmol/L (ref 22–32)
Calcium: 10.3 mg/dL (ref 8.9–10.3)
Chloride: 98 mmol/L (ref 98–111)
Creatinine: 3.04 mg/dL (ref 0.44–1.00)
GFR, Est AFR Am: 16 mL/min — ABNORMAL LOW (ref >60)
GFR, Estimated: 14 mL/min — ABNORMAL LOW (ref >60)
Glucose, Bld: 123 mg/dL — ABNORMAL HIGH (ref 70–99)
Potassium: 3.4 mmol/L — ABNORMAL LOW (ref 3.5–5.1)
Sodium: 138 mmol/L (ref 135–145)
Total Bilirubin: 0.4 mg/dL (ref 0.3–1.2)
Total Protein: 7.9 g/dL (ref 6.5–8.1)

## 2018-11-22 MED ORDER — DENOSUMAB 60 MG/ML ~~LOC~~ SOSY
PREFILLED_SYRINGE | SUBCUTANEOUS | Status: AC
  Filled 2018-11-22: qty 1

## 2018-11-22 MED ORDER — DENOSUMAB 60 MG/ML ~~LOC~~ SOSY
60.0000 mg | PREFILLED_SYRINGE | Freq: Once | SUBCUTANEOUS | Status: AC
  Administered 2018-11-22: 60 mg via SUBCUTANEOUS

## 2018-11-22 MED ORDER — SODIUM CHLORIDE 0.9 % IV SOLN
510.0000 mg | Freq: Once | INTRAVENOUS | Status: DC
  Filled 2018-11-22: qty 17

## 2018-11-22 NOTE — Telephone Encounter (Signed)
Received call report from [facility].  "Today's WBC = 118.8."  Secure Chat message sent with results.  Scheduled provider F/U today11:00 am.

## 2018-11-22 NOTE — Addendum Note (Signed)
Addended by: Chauncey Cruel on: 11/22/2018 11:53 AM   Modules accepted: Orders

## 2018-11-22 NOTE — Patient Instructions (Signed)

## 2018-11-22 NOTE — Telephone Encounter (Signed)
Per MD review of lab today of creatinine of 3.0 and calcium of 10.3 ok to proceed with Prolia .

## 2018-12-01 ENCOUNTER — Other Ambulatory Visit: Payer: Self-pay | Admitting: Oncology

## 2018-12-01 ENCOUNTER — Other Ambulatory Visit: Payer: Self-pay | Admitting: Family Medicine

## 2018-12-05 ENCOUNTER — Encounter: Payer: Medicare Other | Admitting: *Deleted

## 2018-12-06 ENCOUNTER — Telehealth: Payer: Self-pay

## 2018-12-06 NOTE — Telephone Encounter (Signed)
Spoke with patient to remind of missed remote transmission 

## 2018-12-11 ENCOUNTER — Other Ambulatory Visit: Payer: Self-pay

## 2018-12-11 ENCOUNTER — Telehealth: Payer: Self-pay

## 2018-12-11 ENCOUNTER — Ambulatory Visit
Admission: RE | Admit: 2018-12-11 | Discharge: 2018-12-11 | Disposition: A | Discharge status: Home / Self Care | Payer: Medicare Other | Source: Ambulatory Visit | Attending: Oncology | Admitting: Oncology

## 2018-12-11 DIAGNOSIS — D0512 Intraductal carcinoma in situ of left breast: Secondary | ICD-10-CM

## 2018-12-11 DIAGNOSIS — M818 Other osteoporosis without current pathological fracture: Secondary | ICD-10-CM

## 2018-12-11 NOTE — Telephone Encounter (Signed)
Copied from [place] [id number]. Topic: General - Other >> Dec 11, 2018 12:21 PM Celene Kras A wrote: Reason for CRM: Pt called and would like to change her appt on 12/14/2018 to a VV. Please advise.

## 2018-12-11 NOTE — Telephone Encounter (Signed)
Called patient and she stated that she recently had a visit on 11/20/18 and wanted to know if she needed the appointment scheduled on 12/14/18, looks like she has everything updated for her 6 month follow up visit. Patient is getting COVID test for her procedure for heart monitor also.  Please advise if okay to cancel and when to reschedule.

## 2018-12-11 NOTE — Telephone Encounter (Signed)
Since her diabetes has been very stable, let's d/c her 6-19 visit and set up follow up in 4 months.

## 2018-12-12 NOTE — Telephone Encounter (Signed)
Called patient and changed her appointment to 04/02/19. Patient verbalized an understanding.

## 2018-12-13 ENCOUNTER — Other Ambulatory Visit: Payer: Self-pay | Admitting: Cardiology

## 2018-12-13 ENCOUNTER — Telehealth: Payer: Self-pay | Admitting: Internal Medicine

## 2018-12-13 NOTE — Telephone Encounter (Signed)
New message   Patient wants to know how long the procedure on 12/17/2018 will take. Please advise.

## 2018-12-13 NOTE — Telephone Encounter (Signed)
Returned call to Pt. All questions answered.

## 2018-12-14 ENCOUNTER — Other Ambulatory Visit (HOSPITAL_COMMUNITY)
Admission: RE | Admit: 2018-12-14 | Discharge: 2018-12-14 | Disposition: A | Discharge status: Home / Self Care | Payer: Medicare Other | Source: Ambulatory Visit | Attending: Internal Medicine | Admitting: Internal Medicine

## 2018-12-14 ENCOUNTER — Ambulatory Visit: Payer: Medicare Other | Admitting: Family Medicine

## 2018-12-14 DIAGNOSIS — Z1159 Encounter for screening for other viral diseases: Secondary | ICD-10-CM | POA: Diagnosis present

## 2018-12-14 LAB — SARS CORONAVIRUS 2 (TAT 6-24 HRS): SARS Coronavirus 2: NEGATIVE

## 2018-12-17 ENCOUNTER — Encounter (HOSPITAL_COMMUNITY)
Admission: RE | Disposition: A | Discharge status: Home / Self Care | Payer: Self-pay | Source: Home / Self Care | Attending: Internal Medicine

## 2018-12-17 ENCOUNTER — Ambulatory Visit (HOSPITAL_COMMUNITY)
Admission: RE | Admit: 2018-12-17 | Discharge: 2018-12-17 | Disposition: A | Discharge status: Home / Self Care | Payer: Medicare Other | Attending: Internal Medicine | Admitting: Internal Medicine

## 2018-12-17 ENCOUNTER — Other Ambulatory Visit: Payer: Self-pay

## 2018-12-17 DIAGNOSIS — N189 Chronic kidney disease, unspecified: Secondary | ICD-10-CM | POA: Insufficient documentation

## 2018-12-17 DIAGNOSIS — I4891 Unspecified atrial fibrillation: Secondary | ICD-10-CM | POA: Diagnosis present

## 2018-12-17 DIAGNOSIS — Z8249 Family history of ischemic heart disease and other diseases of the circulatory system: Secondary | ICD-10-CM | POA: Diagnosis not present

## 2018-12-17 DIAGNOSIS — Z794 Long term (current) use of insulin: Secondary | ICD-10-CM | POA: Insufficient documentation

## 2018-12-17 DIAGNOSIS — I442 Atrioventricular block, complete: Secondary | ICD-10-CM | POA: Diagnosis not present

## 2018-12-17 DIAGNOSIS — Z888 Allergy status to other drugs, medicaments and biological substances status: Secondary | ICD-10-CM | POA: Insufficient documentation

## 2018-12-17 DIAGNOSIS — M1990 Unspecified osteoarthritis, unspecified site: Secondary | ICD-10-CM | POA: Diagnosis not present

## 2018-12-17 DIAGNOSIS — Z45010 Encounter for checking and testing of cardiac pacemaker pulse generator [battery]: Secondary | ICD-10-CM | POA: Diagnosis present

## 2018-12-17 DIAGNOSIS — E1122 Type 2 diabetes mellitus with diabetic chronic kidney disease: Secondary | ICD-10-CM | POA: Insufficient documentation

## 2018-12-17 DIAGNOSIS — I252 Old myocardial infarction: Secondary | ICD-10-CM | POA: Insufficient documentation

## 2018-12-17 DIAGNOSIS — Z96641 Presence of right artificial hip joint: Secondary | ICD-10-CM | POA: Insufficient documentation

## 2018-12-17 DIAGNOSIS — M109 Gout, unspecified: Secondary | ICD-10-CM | POA: Insufficient documentation

## 2018-12-17 DIAGNOSIS — E785 Hyperlipidemia, unspecified: Secondary | ICD-10-CM | POA: Insufficient documentation

## 2018-12-17 DIAGNOSIS — Z79899 Other long term (current) drug therapy: Secondary | ICD-10-CM | POA: Diagnosis not present

## 2018-12-17 DIAGNOSIS — Z87891 Personal history of nicotine dependence: Secondary | ICD-10-CM | POA: Insufficient documentation

## 2018-12-17 DIAGNOSIS — I5042 Chronic combined systolic (congestive) and diastolic (congestive) heart failure: Secondary | ICD-10-CM | POA: Insufficient documentation

## 2018-12-17 DIAGNOSIS — I130 Hypertensive heart and chronic kidney disease with heart failure and stage 1 through stage 4 chronic kidney disease, or unspecified chronic kidney disease: Secondary | ICD-10-CM | POA: Diagnosis not present

## 2018-12-17 DIAGNOSIS — I2510 Atherosclerotic heart disease of native coronary artery without angina pectoris: Secondary | ICD-10-CM | POA: Insufficient documentation

## 2018-12-17 HISTORY — PX: PPM GENERATOR CHANGEOUT: EP1233

## 2018-12-17 LAB — GLUCOSE, CAPILLARY
Glucose-Capillary: 44 mg/dL — CL (ref 70–99)
Glucose-Capillary: 99 mg/dL (ref 70–99)
Glucose-Capillary: 64 mg/dL — ABNORMAL LOW (ref 70–99)
Glucose-Capillary: 88 mg/dL (ref 70–99)

## 2018-12-17 LAB — SURGICAL PCR SCREEN
MRSA, PCR: NEGATIVE
Staphylococcus aureus: NEGATIVE

## 2018-12-17 SURGERY — PPM GENERATOR CHANGEOUT

## 2018-12-17 MED ORDER — LIDOCAINE HCL (PF) 1 % IJ SOLN
INTRAMUSCULAR | Status: AC
  Filled 2018-12-17: qty 60

## 2018-12-17 MED ORDER — DEXTROSE 50 % IV SOLN
25.0000 mL | Freq: Once | INTRAVENOUS | Status: AC
  Administered 2018-12-17: 25 mL via INTRAVENOUS

## 2018-12-17 MED ORDER — DEXTROSE 50 % IV SOLN
INTRAVENOUS | Status: AC
  Filled 2018-12-17: qty 50

## 2018-12-17 MED ORDER — LIDOCAINE HCL (PF) 1 % IJ SOLN
INTRAMUSCULAR | Status: DC | PRN
  Administered 2018-12-17: 50 mL

## 2018-12-17 MED ORDER — SODIUM CHLORIDE 0.9 % IV SOLN
80.0000 mg | INTRAVENOUS | Status: AC
  Administered 2018-12-17: 13:00:00 80 mg

## 2018-12-17 MED ORDER — ACETAMINOPHEN 325 MG PO TABS
325.0000 mg | ORAL_TABLET | ORAL | Status: DC | PRN
  Filled 2018-12-17: qty 2

## 2018-12-17 MED ORDER — SODIUM CHLORIDE 0.9 % IV SOLN
INTRAVENOUS | Status: DC
  Administered 2018-12-17: 10:00:00 via INTRAVENOUS

## 2018-12-17 MED ORDER — ONDANSETRON HCL 4 MG/2ML IJ SOLN: 4.0000 mg | Freq: Four times a day (QID) | INTRAMUSCULAR | Status: DC | PRN

## 2018-12-17 MED ORDER — MIDAZOLAM HCL 5 MG/5ML IJ SOLN
INTRAMUSCULAR | Status: AC
  Filled 2018-12-17: qty 5

## 2018-12-17 MED ORDER — SODIUM CHLORIDE 0.9 % IV SOLN
INTRAVENOUS | Status: AC
  Filled 2018-12-17: qty 2

## 2018-12-17 MED ORDER — FENTANYL CITRATE (PF) 100 MCG/2ML IJ SOLN
INTRAMUSCULAR | Status: AC
  Filled 2018-12-17: qty 2

## 2018-12-17 MED ORDER — CEFAZOLIN SODIUM-DEXTROSE 2-4 GM/100ML-% IV SOLN
2.0000 g | INTRAVENOUS | Status: AC
  Administered 2018-12-17: 13:00:00 2 g via INTRAVENOUS

## 2018-12-17 MED ORDER — CEFAZOLIN SODIUM-DEXTROSE 2-4 GM/100ML-% IV SOLN
INTRAVENOUS | Status: AC
  Filled 2018-12-17: qty 100

## 2018-12-17 MED ORDER — MUPIROCIN 2 % EX OINT
TOPICAL_OINTMENT | CUTANEOUS | Status: AC
  Administered 2018-12-17: 11:00:00
  Filled 2018-12-17: qty 22

## 2018-12-17 SURGICAL SUPPLY — 5 items
CABLE SURGICAL S-101-97-12 (CABLE) ×3 IMPLANT
IPG PACE AZUR XT DR MRI W1DR01 (Pacemaker) IMPLANT
PACE AZURE XT DR MRI W1DR01 (Pacemaker) ×3 IMPLANT
PAD PRO RADIOLUCENT 2001M-C (PAD) ×3 IMPLANT
TRAY PACEMAKER INSERTION (PACKS) ×3 IMPLANT

## 2018-12-17 NOTE — Progress Notes (Signed)
[facility], RN called and new orders noted.

## 2018-12-17 NOTE — Discharge Instructions (Signed)
Pacemaker Battery Change, Care After  This sheet gives you information about how to care for yourself after your procedure. Your health care provider may also give you more specific instructions. If you have problems or questions, contact your health care provider.  What can I expect after the procedure?  After your procedure, it is common to have:   Pain or soreness at the site where the pacemaker was inserted.   Swelling at the site where the pacemaker was inserted.  Follow these instructions at home:  Incision care     Keep the incision clean and dry.  ? Do not take baths, swim, or use a hot tub until your health care provider approves.  ? You may shower the day after your procedure, or as directed by your health care provider.  ? Pat the area dry with a clean towel. Do not rub the area. This may cause bleeding.   Follow instructions from your health care provider about how to take care of your incision. Make sure you:  ? Wash your hands with soap and water before you change your bandage (dressing). If soap and water are not available, use hand sanitizer.  ? Change your dressing as told by your health care provider.  ? Leave stitches (sutures), skin glue, or adhesive strips in place. These skin closures may need to stay in place for 2 weeks or longer. If adhesive strip edges start to loosen and curl up, you may trim the loose edges. Do not remove adhesive strips completely unless your health care provider tells you to do that.   Check your incision area every day for signs of infection. Check for:  ? More redness, swelling, or pain.  ? More fluid or blood.  ? Warmth.  ? Pus or a bad smell.  Activity   Do not lift anything that is heavier than 10 lb (4.5 kg) until your health care provider says it is okay to do so.   For the first 2 weeks, or as long as told by your health care provider:  ? Avoid lifting your left arm higher than your shoulder.  ? Be gentle when you move your arms over your head. It is okay  to raise your arm to comb your hair.  ? Avoid strenuous exercise.   Ask your health care provider when it is okay to:  ? Resume your normal activities.  ? Return to work or school.  ? Resume sexual activity.  Eating and drinking   Eat a heart-healthy diet. This should include plenty of fresh fruits and vegetables, whole grains, low-fat dairy products, and lean protein like chicken and fish.   Limit alcohol intake to no more than 1 drink a day for non-pregnant women and 2 drinks a day for men. One drink equals 12 oz of beer, 5 oz of wine, or 1 oz of hard liquor.   Check ingredients and nutrition facts on packaged foods and beverages. Avoid the following types of food:  ? Food that is high in salt (sodium).  ? Food that is high in saturated fat, like full-fat dairy or red meat.  ? Food that is high in trans fat, like fried food.  ? Food and drinks that are high in sugar.  Lifestyle   Do not use any products that contain nicotine or tobacco, such as cigarettes and e-cigarettes. If you need help quitting, ask your health care provider.   Take steps to manage and control your weight.     Get regular exercise. Aim for 150 minutes of moderate-intensity exercise (such as walking or yoga) or 75 minutes of vigorous exercise (such as running or swimming) each week.   Manage other health problems, such as diabetes or high blood pressure. Ask your health care provider how you can manage these conditions.  General instructions   Do not drive for 24 hours after your procedure if you were given a medicine to help you relax (sedative).   Take over-the-counter and prescription medicines only as told by your health care provider.   Avoid putting pressure on the area where the pacemaker was placed.   If you need an MRI after your pacemaker has been placed, be sure to tell the health care provider who orders the MRI that you have a pacemaker.   Avoid close and prolonged exposure to electrical devices that have strong  magnetic fields. These include:  ? Cell phones. Avoid keeping them in a pocket near the pacemaker, and try using the ear opposite the pacemaker.  ? MP3 players.  ? Household appliances, like microwaves.  ? Metal detectors.  ? Electric generators.  ? High-tension wires.   Keep all follow-up visits as directed by your health care provider. This is important.  Contact a health care provider if:   You have pain at the incision site that is not relieved by over-the-counter or prescription medicines.   You have any of these around your incision site or coming from it:  ? More redness, swelling, or pain.  ? Fluid or blood.  ? Warmth to the touch.  ? Pus or a bad smell.   You have a fever.   You feel brief, occasional palpitations, light-headedness, or any symptoms that you think might be related to your heart.  Get help right away if:   You experience chest pain that is different from the pain at the pacemaker site.   You develop a red streak that extends above or below the incision site.   You experience shortness of breath.   You have palpitations or an irregular heartbeat.   You have light-headedness that does not go away quickly.   You faint or have dizzy spells.   Your pulse suddenly drops or increases rapidly and does not return to normal.   You begin to gain weight and your legs and ankles swell.  Summary   After your procedure, it is common to have pain, soreness, and some swelling where the pacemaker was inserted.   Make sure to keep your incision clean and dry. Follow instructions from your health care provider about how to take care of your incision.   Check your incision every day for signs of infection, such as more pain or swelling, pus or a bad smell, warmth, or leaking fluid and blood.   Avoid strenuous exercise and lifting your left arm higher than your shoulder for 2 weeks, or as long as told by your health care provider.  This information is not intended to replace advice given to you by  your health care provider. Make sure you discuss any questions you have with your health care provider.  Document Released: 04/03/2013 Document Revised: 05/05/2016 Document Reviewed: 05/05/2016  Elsevier Interactive Patient Education  2019 [employer] Inc[employer]

## 2018-12-17 NOTE — Progress Notes (Addendum)
Called Yolanda Huffman with [employer] states pt  can d/c home she has already seen her. Ambulated to bathroom tol well

## 2018-12-17 NOTE — Progress Notes (Signed)
Reviewed discharge instructions  with pt and her daughter (via telephone) voices understanding.

## 2018-12-17 NOTE — Progress Notes (Signed)
Pt states her  Daughter will be in and out but will not be able to stay with her the full 24 hours after her procedure. Rennis Harding, Rn called and informed.

## 2018-12-17 NOTE — Progress Notes (Signed)
Hypoglycemic Event  CBG: 44  Treatment: D50 25 mL (12.5 gm)  Symptoms: Shaky  Follow-up CBG: [id number] CBG Result:99  Possible Reasons for Event: Inadequate meal intake  Comments/MD notified:Jennifer Audie Box, Rn    [place], [place]

## 2018-12-17 NOTE — H&P (Signed)
Electrophysiology TeleHealth Note   Due to national recommendations of social distancing due to COVID 19, an audio/video telehealth visit is felt to be most appropriate for this patient at this time.  See MyChart message from today for the patient's consent to telehealth for [facility].   Date:  11/14/2018   ID:  Yolanda Huffman, DOB May 26, 1941, MRN [id number]  Location: patient's home  Provider location: [address], [place]  Evaluation Performed: Follow-up visit  PCP:  Eulas Post, MD           Cardiologist:  No primary care provider on file.  Electrophysiologist:  Dr Lovena Le  Chief Complaint:  " I'm doing ok."  History of Present Illness:    Yolanda Huffman is a 78 y.o. female who presents via audio/video conferencing for a telehealth visit today. She is a pleasant 78 yo woman with CHB, s/p PPM insertion. She has reached ERI and has reverted to VVI 65. Since last being seen in our clinic, the patient reports doing very well. The patient is otherwise without complaint today.  The patient denies symptoms of fevers, chills, cough, or new SOB worrisome for COVID 19.      Past Medical History:  Diagnosis Date  . Anemia   . Arthritis    gout  . Atrial fibrillation ([place])    prior Sotalol - d/c'd 2/2 increased creatinine; rate control strategy  . CAD (coronary artery disease)    s/p PCI in [place] in 1/09  . Cancer [facility])    left breast  . Chronic combined systolic and diastolic heart failure (HCC)    Echocardiogram 3/12: Mild LVH, EF 65-46%, normal diastolic function, mild AI, mild MR, PASP 44, normal wall motion  . CKD (chronic kidney disease)    sees Dr. Burman Foster - Stage 3 ?  Marland Kitchen Diabetes mellitus   . Hyperlipemia   . Hypertension   . MI, old    2009  . Osteoporosis   . Pacemaker   . Pleural effusion   . Pneumonia   . PUD (peptic ulcer disease) 10/2010   duodenal ulcer         Past Surgical History:   Procedure Laterality Date  . BREAST BIOPSY Left 07/29/2016  . BREAST LUMPECTOMY Left 09/19/2016  . BREAST LUMPECTOMY WITH RADIOACTIVE SEED LOCALIZATION Left 09/19/2016   Procedure: LEFT BREAST LUMPECTOMY WITH RADIOACTIVE SEED LOCALIZATION;  Surgeon: Autumn Messing III, MD;  Location: [facility];  Service: General;  Laterality: Left;  . CATARACT EXTRACTION Bilateral    01/12/09 and 01/26/09 both eyes  . COLONOSCOPY    . CORONARY ANGIOPLASTY  2009  . PACEMAKER INSERTION  07/13/07  . stent implant  07/13/2007  . TOTAL HIP ARTHROPLASTY Right 07/23/2017   Procedure: TOTAL HIP ARTHROPLASTY ANTERIOR APPROACH;  Surgeon: Mcarthur Rossetti, MD;  Location: [facility];  Service: Orthopedics;  Laterality: Right;          Current Outpatient Medications  Medication Sig Dispense Refill  . anastrozole (ARIMIDEX) 1 MG tablet TAKE 1 TABLET BY MOUTH DAILY (Patient taking differently: Take 1 mg by mouth daily. ) 90 tablet 3  . atorvastatin (LIPITOR) 40 MG tablet TAKE ONE TABLET BY MOUTH DAILY 90 tablet 0  . calcitRIOL (ROCALTROL) 0.25 MCG capsule Take 0.25 mcg by mouth daily.     . calcium carbonate (TUMS - DOSED IN MG ELEMENTAL CALCIUM) 500 MG chewable tablet Chew 1 tablet by mouth See admin instructions. Tuesday, Thursday, Saturday and Sunday    .  Cranberry 500 MG CAPS Take 500 mg by mouth daily with lunch.    . denosumab (PROLIA) 60 MG/ML SOSY injection Inject 60 mg into the skin every 6 (six) months.    . feeding supplement, GLUCERNA SHAKE, (GLUCERNA SHAKE) LIQD Take 237 mLs by mouth 2 (two) times daily between meals. (Patient taking differently: Take 237 mLs by mouth 2 (two) times daily between meals. Ensure)  0  . furosemide (LASIX) 40 MG tablet TAKE TWO TABLETS BY MOUTH EVERY MORNING AND TAKE TAKE ONE TABLET BY MOUTH EVERY EVENING 270 tablet 0  . glucose blood test strip 1 each by Other route 3 (three) times daily. Use as instructed     . Insulin Pen Needle (BD PEN NEEDLE NANO U/F) 32G X 4 MM MISC  Use to inject Levemir one time daily. Dx Code E11.9 100 each 1  . LEVEMIR FLEXTOUCH 100 UNIT/ML Pen INJECT 12 UNITS INTO THE SKIN DAILY 15 mL 1  . metolazone (ZAROXOLYN) 2.5 MG tablet Take 1 tablet (2.5 mg total) by mouth every Monday, Wednesday, and Friday. 30 tablet 1  . metoprolol succinate (TOPROL-XL) 25 MG 24 hr tablet Take 1-2 tablets (25-50 mg total) by mouth See admin instructions. TAKE TWO TABLET BY MOUTH EVERY MORNING AND THEN ONE TABLET BY MOUTH EVERY EVENING (Patient taking differently: Take 25-50 mg by mouth See admin instructions. Take 50 mg in the morning and 25 mg in the evening) 90 tablet 3  . Multiple Vitamin (MULTIVITAMIN WITH MINERALS) TABS tablet Take 1 tablet by mouth daily. FOR ADULT 50+    . Omega-3 Fatty Acids (FISH OIL) 1000 MG CAPS Take 1,000 mg by mouth 2 (two) times daily.    . pantoprazole (PROTONIX) 40 MG tablet TAKE ONE TABLET BY MOUTH DAILY (Patient taking differently: Take 40 mg by mouth daily. ) 90 tablet 4  . potassium chloride SA (K-DUR,KLOR-CON) 20 MEQ tablet Take 40 mEq by mouth 2 (two) times daily. In the morning and at lunch     No current facility-administered medications for this visit.     Allergies:   Amlodipine besylate   Social History:  The patient  reports that she quit smoking about 26 years ago. Her smoking use included cigarettes. She has a 40.00 pack-year smoking history. She has never used smokeless tobacco. She reports that she does not drink alcohol or use drugs.   Family History:  The patient's  family history includes Breast cancer (age of onset: 25) in an other family member; Breast cancer (age of onset: 28) in her maternal aunt; Breast cancer (age of onset: 5) in her sister; Clotting disorder in her father; Heart attack in her father; Heart disease in her father and mother; Hypertension in her father.   ROS:  Please see the history of present illness.   All other systems are personally reviewed and negative.    Exam:     Vital Signs:  bp - 128/73, p -65   Labs/Other Tests and Data Reviewed:    Recent Labs: 07/24/2018: ALT 15 08/08/2018: Pro B Natriuretic peptide (BNP) 1,399.0 08/09/2018: B Natriuretic Peptide 1,621.7; Hemoglobin 9.9; Platelets 211 08/14/2018: BUN 57 09/05/2018: Creatinine 2.7; Potassium 4.0; Sodium 135      Wt Readings from Last 3 Encounters:  09/04/18 143 lb 6.4 oz (65 kg)  08/21/18 142 lb 12.8 oz (64.8 kg)  08/14/18 145 lb 3.2 oz (65.9 kg)     Other studies personally reviewed: Additional studies/ records that were reviewed today include: Last device remote  is reviewed from [place] PDF dated 10/29/18 which reveals normal device function, no arrhythmias    ASSESSMENT & PLAN:    1.  CHB - she is asymptomatic sp ppm. 2. PPM - she has reached ERI. She will undergo ppm gen change out. 3. Atrial fib - she is chronic. Her VR is well controlled. 4. COVID 19 screen The patient denies symptoms of COVID 19 at this time.  The importance of social distancing was discussed today.  Follow-up:  4 months Next remote: 6 months  Current medicines are reviewed at length with the patient today.   The patient does not have concerns regarding her medicines.  The following changes were made today:  none  Labs/ tests ordered today include: none No orders of the defined types were placed in this encounter.    Patient Risk:  after full review of this patients clinical status, I feel that they are at moderate risk at this time.  Today, I have spent 24 minutes with the patient with telehealth technology discussing all of above.    Signed, Cristopher Peru, MD  11/14/2018 1:49 PM

## 2018-12-18 ENCOUNTER — Encounter (HOSPITAL_COMMUNITY): Payer: Self-pay | Admitting: Internal Medicine

## 2018-12-19 ENCOUNTER — Telehealth: Payer: Self-pay | Admitting: Internal Medicine

## 2018-12-19 NOTE — Telephone Encounter (Signed)
Patient had procedure done she is calling about her dressing.  She would like to know if she needs to take the dressing off, she states she wasn't told. She states she took the outer one off, but she wants to know what the one that was under that one.

## 2018-12-19 NOTE — Telephone Encounter (Signed)
Patient had generator change on 12/17/18. Yolanda Huffman was instructed by Dr. Lovena Le to take the outer big dressing off on 6/23 which Yolanda Huffman did. Yolanda Huffman still has a small dressing in place, sounds like a tega derm. Wants to know should Yolanda Huffman remove this.  Her next visit is virtual with Dr. Lovena Le on 7/2. Will route to device nurse to advise if this dressing can be removed.  Yolanda Huffman cannot see if there are steri strips underneath.

## 2018-12-20 NOTE — Telephone Encounter (Signed)
Explained that the pt needs to leave the steri-strips on until wound care appt on 12/27/18. Pt verbalizes understanding. No further questions at this time.

## 2018-12-21 ENCOUNTER — Encounter (HOSPITAL_COMMUNITY): Payer: Self-pay | Admitting: Internal Medicine

## 2018-12-26 ENCOUNTER — Telehealth: Payer: Self-pay | Admitting: Internal Medicine

## 2018-12-26 ENCOUNTER — Encounter: Payer: Self-pay | Admitting: Family Medicine

## 2018-12-26 NOTE — Telephone Encounter (Signed)
Scheduler contacted pt's daughter to clarify appointment instructions.

## 2018-12-26 NOTE — Telephone Encounter (Signed)
  Daughter is trying to fill out the e registration on MyChart and she has questions about how to finish the registration. She needs assistance getting her mom registered for her virtual visit. She also has questions about how the video visit will work.

## 2018-12-26 NOTE — Telephone Encounter (Signed)

## 2018-12-27 ENCOUNTER — Telehealth (INDEPENDENT_AMBULATORY_CARE_PROVIDER_SITE_OTHER): Payer: Medicare Other | Admitting: Student

## 2018-12-27 ENCOUNTER — Other Ambulatory Visit: Payer: Self-pay

## 2018-12-27 DIAGNOSIS — Z950 Presence of cardiac pacemaker: Secondary | ICD-10-CM | POA: Diagnosis not present

## 2018-12-27 LAB — CUP PACEART REMOTE DEVICE CHECK
Battery Voltage: 3.19 V
Brady Statistic RV Percent Paced: 99.6 %
Date Time Interrogation Session: 20200702131740
Implantable Lead Implant Date: 20090116
Implantable Lead Implant Date: 20090116
Implantable Lead Location: 753859
Implantable Lead Location: 753860
Implantable Lead Model: 5076
Implantable Lead Model: 5076
Implantable Pulse Generator Implant Date: 20200622
Lead Channel Impedance Value: 342 Ohm
Lead Channel Impedance Value: 380 Ohm
Lead Channel Pacing Threshold Amplitude: 1.25 V
Lead Channel Pacing Threshold Pulse Width: 0.4 ms

## 2018-12-27 NOTE — Progress Notes (Signed)
VIRTUAL Wound check appointment. Steri-strips removed by patient and family. From visual data available via video chat, it appears without redness or edema, which family confirmed verbally. Incision edges appear approximated and pt denies drainage, bleeding, or any open wound. Manual transmission sent and reviewed during visit. Normal device function. Thresholds, sensing, and impedances consistent with implant measurements. Device programmed at 3.25V for extra safety margin until 3 month visit. Histogram distribution appropriate for patient and level of activity. No high ventricular rates noted. AT/AF monitoring disabled with permanent AF. Patient educated about wound care, arm mobility, lifting restrictions. ROV in 3 months with implanting physician. Batter longevity 9.3 years.  Legrand Como [address] [place], PA-C 12/27/2018 1:13 PM

## 2019-01-01 ENCOUNTER — Other Ambulatory Visit (HOSPITAL_COMMUNITY): Payer: Medicare Other

## 2019-01-17 ENCOUNTER — Other Ambulatory Visit: Payer: Self-pay

## 2019-01-17 ENCOUNTER — Ambulatory Visit (HOSPITAL_COMMUNITY): Payer: Medicare Other | Attending: Cardiology

## 2019-01-17 DIAGNOSIS — I340 Nonrheumatic mitral (valve) insufficiency: Secondary | ICD-10-CM | POA: Diagnosis not present

## 2019-01-21 NOTE — Telephone Encounter (Signed)
Pt daughter updated with test results. Daughter questioning as to what could have helped with the improvement in pt's mitral regurgitation.

## 2019-02-26 ENCOUNTER — Telehealth: Payer: Self-pay

## 2019-02-26 NOTE — Telephone Encounter (Signed)
Called pt to changed appointment to virtual visit. Pt agreeable. Appointment changed in chart.

## 2019-03-01 ENCOUNTER — Telehealth (INDEPENDENT_AMBULATORY_CARE_PROVIDER_SITE_OTHER): Payer: Medicare Other | Admitting: Cardiology

## 2019-03-01 VITALS — BP 130/81 | HR 79 | Ht 63.0 in | Wt 139.0 lb

## 2019-03-01 DIAGNOSIS — I5042 Chronic combined systolic (congestive) and diastolic (congestive) heart failure: Secondary | ICD-10-CM

## 2019-03-01 DIAGNOSIS — I071 Rheumatic tricuspid insufficiency: Secondary | ICD-10-CM

## 2019-03-01 DIAGNOSIS — Z7189 Other specified counseling: Secondary | ICD-10-CM

## 2019-03-01 DIAGNOSIS — I4821 Permanent atrial fibrillation: Secondary | ICD-10-CM

## 2019-03-01 DIAGNOSIS — I340 Nonrheumatic mitral (valve) insufficiency: Secondary | ICD-10-CM

## 2019-03-01 NOTE — Patient Instructions (Signed)

## 2019-03-01 NOTE — Progress Notes (Signed)
Virtual Visit via Telephone Note   This visit type was conducted due to national recommendations for restrictions regarding the COVID-19 Pandemic (e.g. social distancing) in an effort to limit this patient's exposure and mitigate transmission in our community.  Due to her co-morbid illnesses, this patient is at least at moderate risk for complications without adequate follow up.  This format is felt to be most appropriate for this patient at this time.  The patient did not have access to video technology/had technical difficulties with video requiring transitioning to audio format only (telephone).  All issues noted in this document were discussed and addressed.  No physical exam could be performed with this format.  Please refer to the patient's chart for her  consent to telehealth for [facility].   Date:  03/01/2019   ID:  RICHA SHOR, DOB 1941-02-23, MRN [id number]  Patient Location: Home Provider Location: Home  PCP:  Eulas Post, MD  Cardiologist:  Buford Dresser, MD  Electrophysiologist:  Dr. Lovena Le   Evaluation Performed:  Follow-Up Visit  Chief Complaint:  Follow up  History of Present Illness:    TAMBRA MULLER is a 78 y.o. 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 My initial visit with her was on 09/04/18.  The patient does not have symptoms concerning for COVID-19 infection (fever, chills, cough, or new shortness of breath).   Did well with generator change. Dual chamber leads not changed, no LV lead. Recovering well from this, no issues.  Legs without edema, legs are "skinny as sticks." No shortness of breath at rest or with exertion. Walks to [employer] and back, walks around the house without any issues. No PND or orthopnea. No chest pain. No syncope or palpitations.  We discussed  her MR and TR at length. Also discussed atrial fibrillation, stroke risk, coumadin, and her GI workup after GI bleeding. Summary below in the plan.   Overall feels very well, maintaining a good outlook. Feels as though "if it ain't broke, don't fix it."   Past Medical History:  Diagnosis Date  . Anemia   . Arthritis    gout  . Atrial fibrillation ([place])    prior Sotalol - d/c'd 2/2 increased creatinine; rate control strategy  . CAD (coronary artery disease)    s/p PCI in [place] in 1/09  . Cancer [facility])    left breast  . Chronic combined systolic and diastolic heart failure (HCC)    Echocardiogram 3/12: Mild LVH, EF 25-95%, normal diastolic function, mild AI, mild MR, PASP 44, normal wall motion  . CKD (chronic kidney disease)    sees Dr. Burman Foster - Stage 3 ?  Marland Kitchen Diabetes mellitus   . Hyperlipemia   . Hypertension   . MI, old    2009  . Osteoporosis   . Pacemaker   . Pleural effusion   . Pneumonia   . PUD (peptic ulcer disease) 10/2010   duodenal ulcer   Past Surgical History:  Procedure Laterality Date  . BREAST BIOPSY Left 07/29/2016  . BREAST LUMPECTOMY Left 09/19/2016  . BREAST LUMPECTOMY WITH RADIOACTIVE SEED LOCALIZATION Left 09/19/2016   Procedure: LEFT BREAST LUMPECTOMY WITH RADIOACTIVE SEED LOCALIZATION;  Surgeon: Autumn Messing III, MD;  Location: [place];  Service: General;  Laterality: Left;  . CATARACT EXTRACTION Bilateral    01/12/09 and 01/26/09 both eyes  .  COLONOSCOPY    . CORONARY ANGIOPLASTY  2009  . PACEMAKER INSERTION  07/13/07  . PPM GENERATOR CHANGEOUT N/A 12/17/2018   Procedure: PPM GENERATOR CHANGEOUT;  Surgeon: Evans Lance, MD;  Location: [place] CV [facility];  Service: Cardiovascular;  Laterality: N/A;  . stent implant  07/13/2007  . TOTAL HIP ARTHROPLASTY Right 07/23/2017   Procedure: TOTAL HIP ARTHROPLASTY ANTERIOR APPROACH;  Surgeon: Mcarthur Rossetti, MD;  Location: [place];  Service: Orthopedics;  Laterality: Right;     Current Meds  Medication  Sig  . acetaminophen (TYLENOL) 500 MG tablet Take 500-1,000 mg by mouth daily as needed for moderate pain.  Marland Kitchen anastrozole (ARIMIDEX) 1 MG tablet TAKE 1 TABLET BY MOUTH DAILY (Patient taking differently: Take 1 mg by mouth daily with lunch. )  . atorvastatin (LIPITOR) 40 MG tablet TAKE ONE TABLET BY MOUTH DAILY (Patient taking differently: Take 40 mg by mouth at bedtime. )  . calcium carbonate (TUMS - DOSED IN MG ELEMENTAL CALCIUM) 500 MG chewable tablet Chew 1 tablet by mouth See admin instructions. Tuesday, Thursday, Saturday and Sunday  . Cranberry 500 MG CAPS Take 500 mg by mouth daily with lunch.  . denosumab (PROLIA) 60 MG/ML SOSY injection Inject 60 mg into the skin every 6 (six) months.  . feeding supplement, GLUCERNA SHAKE, (GLUCERNA SHAKE) LIQD Take 237 mLs by mouth 2 (two) times daily between meals. (Patient taking differently: Take 237 mLs by mouth daily as needed. )  . furosemide (LASIX) 40 MG tablet TAKE 2 TABLETS BY MOUTH EVERY MORNING AND 1 TABLET EVERY EVENING (Patient taking differently: Take 40-80 mg by mouth See admin instructions. Take 80 mg in the morning and 40 mg around lunch time)  . glucose blood test strip 1 each by Other route 3 (three) times daily. Use as instructed   . Insulin Pen Needle (BD PEN NEEDLE NANO U/F) 32G X 4 MM MISC Use to inject Levemir one time daily. Dx Code E11.9  . LEVEMIR FLEXTOUCH 100 UNIT/ML Pen INJECT 12 UNITS INTO THE SKIN DAILY (Patient taking differently: Inject 12 Units into the skin at bedtime. )  . metolazone (ZAROXOLYN) 2.5 MG tablet TAKE ONE TABLET BY MOUTH DAILY EVERY MONDAY, WEDNESDAY, AND FRIDAY  . metoprolol succinate (TOPROL-XL) 25 MG 24 hr tablet TAKE TWO TABLETS  BY MOUTH EVERY MORNING AND TAKE ONE TABLET BY MOUTH EVERY EVENING (Patient taking differently: Take 25-50 mg by mouth See admin instructions. Take 50 mg in the morning and 25 mg in the evening)  . Multiple Vitamin (MULTIVITAMIN WITH MINERALS) TABS tablet Take 1 tablet by mouth  daily. FOR ADULT 50+  . Omega-3 Fatty Acids (FISH OIL) 1000 MG CAPS Take 1,000 mg by mouth daily.   . pantoprazole (PROTONIX) 40 MG tablet TAKE ONE TABLET BY MOUTH DAILY (Patient taking differently: Take 40 mg by mouth daily. )  . potassium chloride SA (K-DUR,KLOR-CON) 20 MEQ tablet Take 40 mEq by mouth 2 (two) times daily. In the morning and at lunch     Allergies:   Amlodipine besylate   Social History   Tobacco Use  . Smoking status: Former Smoker    Packs/day: 2.00    Years: 20.00    Pack years: 40.00    Types: Cigarettes    Quit date: 11/26/1991    Years since quitting: 27.2  . Smokeless tobacco: Never Used  Substance Use Topics  . Alcohol use: No  . Drug use: No     Family Hx: The patient's family  history includes Breast cancer (age of onset: 85) in an other family member; Breast cancer (age of onset: 26) in her maternal aunt; Breast cancer (age of onset: 36) in her sister; Clotting disorder in her father; Heart attack in her father; Heart disease in her father and mother; Hypertension in her father. There is no history of Colon cancer.  ROS:   Please see the history of present illness.    Constitutional: Negative for chills, fever, night sweats, unintentional weight loss  HENT: Negative for ear pain and hearing loss.   Eyes: Negative for loss of vision and eye pain.  Respiratory: Negative for cough, sputum, wheezing.   Cardiovascular: See HPI. Gastrointestinal: Negative for abdominal pain, melena, and hematochezia.  Genitourinary: Negative for dysuria and hematuria.  Musculoskeletal: Negative for falls and myalgias.  Skin: Negative for itching and rash.  Neurological: Negative for focal weakness, focal sensory changes and loss of consciousness.  Endo/Heme/Allergies: Does not bruise/bleed easily.  All other systems reviewed and are negative.   Prior CV studies:   The following studies were reviewed today: Echo 04/05/18 - Left ventricle: The cavity size was normal.  Systolic function was mildly reduced. The estimated ejection fraction was in the range of 45% to 50%. Dysynchronous contraction of the ventricular septum with paradoxical motion likely secondary to pacing. There were no other regional wall motion abnormalities. The study is not technically sufficient to allow evaluation of LV diastolic function. - Aortic valve: A bicuspid morphology cannot be excluded (congenital bicuspid with right and left cusp fusion and disproportionately larger non coronary cusp vs. acquired functional bicuspid with calcific sclerotic fusion); severe leaflet sclerosis. Valve mobility was restricted. Transvalvular velocity was within the normal range. There was no stenosis. There was at least moderate regurgitation, eccentric, directed toward the anterior mitral leaflet with evidence of mitral leaflet fluttering on m-mode. Mean gradient (S): 6 mm Hg. - Aorta: Dilated aortic root - Aortic root dimension: 41.7 mm. - Mitral valve: There was moderate to severe regurgitation. - Left atrium: The atrium was severely dilated. - Pulmonary veins: Possible systolic reversal in the right lower pulmonary vein that was interrogated, though Doppler signal is somewhat indeterminate. - Right ventricle: The cavity size was moderately dilated. Wall thickness was normal. Pacer wire or catheter noted in right ventricle. Systolic function was mildly reduced. - Right atrium: The atrium was moderately to severely dilated. - Tricuspid valve: There was severe regurgitation. - Pulmonary arteries: PA peak pressure: 52 mm Hg (S). - Inferior vena cava: The vessel was dilated. The respirophasic diameter changes were blunted (<50%), consistent with elevated central venous pressure. - Hepatic veins: Prominent systolic flow reversal noted. - Pericardium, extracardiac: There was no pericardial effusion.  Recommendations: Recommend transesophageal  echocardiogram to better evaluate severity of multi-valvular heart disease including aortic valve (congenital bicuspid R-L fusion vs. acquired functional bicuspid, with regurgitation), tricuspid valve in setting of pacemaker lead, and mitral valve (PISA quantitation may be underestimating severity due to mitral leaflet thickening).  Labs/Other Tests and Data Reviewed:    EKG:  An ECG dated 09/04/18 was personally reviewed today and demonstrated:  afib, v paced  Recent Labs: 08/08/2018: Pro B Natriuretic peptide (BNP) 1,399.0 08/09/2018: B Natriuretic Peptide 1,621.7 11/22/2018: ALT 11; BUN 61; Creatinine 3.04; Hemoglobin 12.1; Platelets 194; Potassium 3.4; Sodium 138   Recent Lipid Panel Lab Results  Component Value Date/Time   CHOL 164 12/13/2017 10:09 AM   TRIG 109.0 12/13/2017 10:09 AM   HDL 59.20 12/13/2017 10:09 AM   CHOLHDL  3 12/13/2017 10:09 AM   LDLCALC 83 12/13/2017 10:09 AM   LDLDIRECT 89.2 06/03/2010 09:46 AM    Wt Readings from Last 3 Encounters:  03/01/19 139 lb (63 kg)  12/17/18 139 lb (63 kg)  11/22/18 145 lb (65.8 kg)     Objective:    Vital Signs:  BP 130/81   Pulse 79   Ht 5\' 3"  (1.6 m)   Wt 139 lb (63 kg)   BMI 24.62 kg/m    In no acute distress Speaking easily on the phone Alert and oriented x3  ASSESSMENT & PLAN:    Mitral regurgitation, moderate to severe, with severe TR: we reviewed again today. She reports no symptoms that are limiting, having good results with current diuretic plan. No LE edema, PND, orthopnea, DOE -discussed TEE again, with shared decision making will not pursue at this time unless her symptoms worsen -continue current diuretic regimen -echo would not change management at this time  Chronic systolic and diastolic heart failure: asymptomatic, NYHA class I currently -discussed echo as above -given CKD stage, not a candidate for ACEi, ARB, ARNI, or MRA -already on metoprolol succinate  Permanent atrial fibrillation  -CHA2DS2/VAS Stroke Risk Points  =  7 -severe GI bleed with hospitalization. Capsule study showed AVMs but not active bleeding -we discussed risk/benefit at length today. It's a complex and difficulty decisions. She is at high risk of stroke, but she also had severe GI bleed without clear source -she reports being absolutely miserable with her GI bleed. Her coumadin had been difficult, with labile INRs, and her renal function precludes DOAC use. -after shared decision making, she would like to remain off coumadin at this time. We will continue to readdress   Cardiac risk counseling and prevention recommendations: -recommend heart healthy/Mediterranean diet, with whole grains, fruits, vegetable, fish, lean meats, nuts, and olive oil. Limit salt. -recommend moderate walking, 3-5 times/week for 30-50 minutes each session. Aim for at least 150 minutes.week. Goal should be pace of 3 miles/hours, or walking 1.5 miles in 30 minutes -recommend avoidance of tobacco products. Avoid excess alcohol. -Additional risk factor control:             -Diabetes: A1c is 5.9, on insulin             -Lipids: LDL 83, TG 109, on atorvastatin             -Blood pressure control: within goal range             -Weight: BMI 24  COVID-19 Education: The signs and symptoms of COVID-19 were discussed with the patient and how to seek care for testing (follow up with PCP or arrange E-visit).  The importance of social distancing was discussed today.  Time:   Today, I have spent 26 minutes with the patient with telehealth technology discussing the above problems.    Patient Instructions  Medication Instructions:  Your Physician recommend you continue on your current medication as directed.    If you need a refill on your cardiac medications before your next appointment, please call your pharmacy.   Lab work: None  Testing/Procedures: None  Follow-Up: At [facility], you and your health needs are our priority.  As  part of our continuing mission to provide you with exceptional heart care, we have created designated Provider Care Teams.  These Care Teams include your primary Cardiologist (physician) and Advanced Practice Providers (APPs -  Physician Assistants and Nurse Practitioners) who all work together to provide  you with the care you need, when you need it. You will need a follow up appointment in 6 months.  Please call our office 2 months in advance to schedule this appointment.  You may see Buford Dresser, MD or one of the following Advanced Practice Providers on your designated Care Team:   Rosaria Ferries, PA-C . Jory Sims, DNP, ANP       Follow Up:  6 mos  Signed, Buford Dresser, MD  03/01/2019 9:25 AM    [place]

## 2019-03-10 ENCOUNTER — Other Ambulatory Visit: Payer: Self-pay | Admitting: Internal Medicine

## 2019-03-10 ENCOUNTER — Other Ambulatory Visit: Payer: Self-pay | Admitting: Family Medicine

## 2019-03-19 ENCOUNTER — Ambulatory Visit (INDEPENDENT_AMBULATORY_CARE_PROVIDER_SITE_OTHER): Payer: Medicare Other | Admitting: *Deleted

## 2019-03-19 DIAGNOSIS — I495 Sick sinus syndrome: Secondary | ICD-10-CM | POA: Diagnosis not present

## 2019-03-19 DIAGNOSIS — I5041 Acute combined systolic (congestive) and diastolic (congestive) heart failure: Secondary | ICD-10-CM

## 2019-03-19 LAB — CUP PACEART REMOTE DEVICE CHECK
Battery Remaining Longevity: 130 mo
Battery Voltage: 3.15 V
Brady Statistic AP VP Percent: 0 %
Brady Statistic AP VS Percent: 0 %
Brady Statistic AS VP Percent: 99.75 %
Brady Statistic AS VS Percent: 0.25 %
Brady Statistic RA Percent Paced: 0 %
Brady Statistic RV Percent Paced: 99.75 %
Date Time Interrogation Session: 20200922175636
Implantable Lead Implant Date: 20090116
Implantable Lead Implant Date: 20090116
Implantable Lead Location: 753859
Implantable Lead Location: 753860
Implantable Lead Model: 5076
Implantable Lead Model: 5076
Implantable Pulse Generator Implant Date: 20200622
Lead Channel Impedance Value: 2907 Ohm
Lead Channel Impedance Value: 323 Ohm
Lead Channel Impedance Value: 342 Ohm
Lead Channel Impedance Value: 399 Ohm
Lead Channel Pacing Threshold Amplitude: 1.25 V
Lead Channel Pacing Threshold Pulse Width: 0.4 ms
Lead Channel Sensing Intrinsic Amplitude: 0.75 mV
Lead Channel Sensing Intrinsic Amplitude: 2.75 mV
Lead Channel Sensing Intrinsic Amplitude: 2.75 mV
Lead Channel Setting Pacing Amplitude: 2.5 V
Lead Channel Setting Pacing Pulse Width: 0.4 ms
Lead Channel Setting Sensing Sensitivity: 1.2 mV

## 2019-03-21 ENCOUNTER — Encounter: Payer: Self-pay | Admitting: Internal Medicine

## 2019-03-21 ENCOUNTER — Other Ambulatory Visit: Payer: Self-pay

## 2019-03-21 ENCOUNTER — Ambulatory Visit (INDEPENDENT_AMBULATORY_CARE_PROVIDER_SITE_OTHER): Payer: Medicare Other | Admitting: Internal Medicine

## 2019-03-21 VITALS — BP 138/80 | HR 88 | Ht 63.0 in | Wt 148.0 lb

## 2019-03-21 DIAGNOSIS — Z950 Presence of cardiac pacemaker: Secondary | ICD-10-CM

## 2019-03-21 DIAGNOSIS — I5042 Chronic combined systolic (congestive) and diastolic (congestive) heart failure: Secondary | ICD-10-CM

## 2019-03-21 DIAGNOSIS — I495 Sick sinus syndrome: Secondary | ICD-10-CM | POA: Diagnosis not present

## 2019-03-21 DIAGNOSIS — I4821 Permanent atrial fibrillation: Secondary | ICD-10-CM | POA: Diagnosis not present

## 2019-03-21 NOTE — Progress Notes (Signed)
HPI Yolanda Huffman returns today for ongoing evaluation and management of atrial fib and chb. She is s/p PPM gen change out 3 months ago. She was sore after the procedure but is now feeling well. She denies chest pain, sob, or edema. She has not had syncope. She has not fallen. She admits to some dietary indiscretion. Allergies  Allergen Reactions  . Amlodipine Besylate Swelling     Current Outpatient Medications  Medication Sig Dispense Refill  . acetaminophen (TYLENOL) 500 MG tablet Take 500-1,000 mg by mouth daily as needed for moderate pain.    Marland Kitchen anastrozole (ARIMIDEX) 1 MG tablet TAKE 1 TABLET BY MOUTH DAILY (Patient taking differently: Take 1 mg by mouth daily with lunch. ) 90 tablet 2  . atorvastatin (LIPITOR) 40 MG tablet TAKE ONE TABLET BY MOUTH DAILY 90 tablet 0  . calcium carbonate (TUMS - DOSED IN MG ELEMENTAL CALCIUM) 500 MG chewable tablet Chew 1 tablet by mouth See admin instructions. Tuesday, Thursday, Saturday and Sunday    . Cranberry 500 MG CAPS Take 500 mg by mouth daily with lunch.    . denosumab (PROLIA) 60 MG/ML SOSY injection Inject 60 mg into the skin every 6 (six) months.    . furosemide (LASIX) 40 MG tablet TAKE 2 TABLETS BY MOUTH EVERY MORNING AND 1 EVERY EVENING 270 tablet 0  . glucose blood test strip 1 each by Other route 3 (three) times daily. Use as instructed     . Insulin Pen Needle (BD PEN NEEDLE NANO U/F) 32G X 4 MM MISC Use to inject Levemir one time daily. Dx Code E11.9 100 each 1  . KLOR-CON M20 20 MEQ tablet TAKE TWO TABLETS BY MOUTH TWICE A DAY WITH FOOD AND WATER 360 tablet 3  . LEVEMIR FLEXTOUCH 100 UNIT/ML Pen INJECT 12 UNITS INTO THE SKIN DAILY (Patient taking differently: Inject 12 Units into the skin at bedtime. ) 15 mL 1  . metolazone (ZAROXOLYN) 2.5 MG tablet TAKE ONE TABLET BY MOUTH DAILY EVERY MONDAY, WEDNESDAY, AND FRIDAY 30 tablet 2  . metoprolol succinate (TOPROL-XL) 25 MG 24 hr tablet TAKE TWO TABLETS  BY MOUTH EVERY MORNING AND TAKE  ONE TABLET BY MOUTH EVERY EVENING (Patient taking differently: Take 25-50 mg by mouth See admin instructions. Take 50 mg in the morning and 25 mg in the evening) 270 tablet 2  . Multiple Vitamin (MULTIVITAMIN WITH MINERALS) TABS tablet Take 1 tablet by mouth daily. FOR ADULT 50+    . Omega-3 Fatty Acids (FISH OIL) 1000 MG CAPS Take 1,000 mg by mouth daily.     . pantoprazole (PROTONIX) 40 MG tablet TAKE ONE TABLET BY MOUTH DAILY (Patient taking differently: Take 40 mg by mouth daily. ) 90 tablet 4   No current facility-administered medications for this visit.      Past Medical History:  Diagnosis Date  . Anemia   . Arthritis    gout  . Atrial fibrillation ([place])    prior Sotalol - d/c'd 2/2 increased creatinine; rate control strategy  . CAD (coronary artery disease)    s/p PCI in [place] in 1/09  . Cancer [facility])    left breast  . Chronic combined systolic and diastolic heart failure (HCC)    Echocardiogram 3/12: Mild LVH, EF 33-29%, normal diastolic function, mild AI, mild MR, PASP 44, normal wall motion  . CKD (chronic kidney disease)    sees Dr. Burman Foster - Stage 3 ?  Marland Kitchen Diabetes mellitus   .  Hyperlipemia   . Hypertension   . MI, old    2009  . Osteoporosis   . Pacemaker   . Pleural effusion   . Pneumonia   . PUD (peptic ulcer disease) 10/2010   duodenal ulcer    ROS:   All systems reviewed and negative except as noted in the HPI.   Past Surgical History:  Procedure Laterality Date  . BREAST BIOPSY Left 07/29/2016  . BREAST LUMPECTOMY Left 09/19/2016  . BREAST LUMPECTOMY WITH RADIOACTIVE SEED LOCALIZATION Left 09/19/2016   Procedure: LEFT BREAST LUMPECTOMY WITH RADIOACTIVE SEED LOCALIZATION;  Surgeon: Autumn Messing III, MD;  Location: [place];  Service: General;  Laterality: Left;  . CATARACT EXTRACTION Bilateral    01/12/09 and 01/26/09 both eyes  . COLONOSCOPY    . CORONARY ANGIOPLASTY  2009  . PACEMAKER INSERTION  07/13/07  . PPM GENERATOR CHANGEOUT N/A 12/17/2018    Procedure: PPM GENERATOR CHANGEOUT;  Surgeon: Evans Lance, MD;  Location: [place] [facility];  Service: Cardiovascular;  Laterality: N/A;  . stent implant  07/13/2007  . TOTAL HIP ARTHROPLASTY Right 07/23/2017   Procedure: TOTAL HIP ARTHROPLASTY ANTERIOR APPROACH;  Surgeon: Mcarthur Rossetti, MD;  Location: [place];  Service: Orthopedics;  Laterality: Right;     Family History  Problem Relation Age of Onset  . Heart disease Father   . Heart attack Father   . Clotting disorder Father        blood clot  . Hypertension Father   . Heart disease Mother   . Breast cancer Maternal Aunt 43  . Breast cancer Sister 72  . Breast cancer Other 40  . Colon cancer Neg Hx      Social History   Socioeconomic History  . Marital status: Widowed    Spouse name: Not on file  . Number of children: 1  . Years of education: Not on file  . Highest education level: Not on file  Occupational History  . Occupation: retired Theatre stage manager: RETIRED  Social Needs  . Financial resource strain: Not on file  . Food insecurity    Worry: Not on file    Inability: Not on file  . Transportation needs    Medical: Not on file    Non-medical: Not on file  Tobacco Use  . Smoking status: Former Smoker    Packs/day: 2.00    Years: 20.00    Pack years: 40.00    Types: Cigarettes    Quit date: 11/26/1991    Years since quitting: 27.3  . Smokeless tobacco: Never Used  Substance and Sexual Activity  . Alcohol use: No  . Drug use: No  . Sexual activity: Not on file  Lifestyle  . Physical activity    Days per week: Not on file    Minutes per session: Not on file  . Stress: Not on file  Relationships  . Social Herbalist on phone: Not on file    Gets together: Not on file    Attends religious service: Not on file    Active member of club or organization: Not on file    Attends meetings of clubs or organizations: Not on file    Relationship status: Not on file  . Intimate partner  violence    Fear of current or ex partner: Not on file    Emotionally abused: Not on file    Physically abused: Not on file    Forced sexual  activity: Not on file  Other Topics Concern  . Not on file  Social History Narrative   Reg exercise           BP 138/80   Pulse 88   Ht 5\' 3"  (1.6 m)   Wt 148 lb (67.1 kg)   SpO2 98%   BMI 26.22 kg/m   Physical Exam:  Well appearing NAD HEENT: Unremarkable Neck:  No JVD, no thyromegally Lymphatics:  No adenopathy Back:  No CVA tenderness Lungs:  Clear HEART:  Regular rate rhythm, no murmurs, no rubs, no clicks Abd:  soft, positive bowel sounds, no organomegally, no rebound, no guarding Ext:  2 plus pulses, no edema, no cyanosis, no clubbing Skin:  No rashes no nodules Neuro:  CN II through XII intact, motor grossly intact  EKG  - atrial fib with ventricular pacing  DEVICE  Normal device function.  See PaceArt for details.   Assess/Plan: 1. Atrial fib - her VR is well controlled. She will continue her current meds. 2. PPM - her medtronic VVI ppm is working normally.  3. HTN - her bp is minimally elevated.   Mikle Bosworth.D.

## 2019-03-21 NOTE — Patient Instructions (Addendum)
Medication Instructions:  Your physician recommends that you continue on your current medications as directed. Please refer to the Current Medication list given to you today.  Labwork: None ordered.  Testing/Procedures: None ordered.  Follow-Up: Your physician wants you to follow-up in: 9 months with Dr. Lovena Le.   You will receive a reminder letter in the mail two months in advance. If you don't receive a letter, please call our office to schedule the follow-up appointment.  Remote monitoring is used to monitor your Pacemaker from home. This monitoring reduces the number of office visits required to check your device to one time per year. It allows [place] to keep an eye on the functioning of your device to ensure it is working properly. You are scheduled for a device check from home on 06/18/2019. You may send your transmission at any time that day. If you have a wireless device, the transmission will be sent automatically. After your physician reviews your transmission, you will receive a postcard with your next transmission date.  Any Other Special Instructions Will Be Listed Below (If Applicable).  If you need a refill on your cardiac medications before your next appointment, please call your pharmacy.

## 2019-04-01 NOTE — Progress Notes (Signed)
Remote pacemaker transmission.   

## 2019-04-02 ENCOUNTER — Other Ambulatory Visit: Payer: Self-pay

## 2019-04-02 ENCOUNTER — Ambulatory Visit (INDEPENDENT_AMBULATORY_CARE_PROVIDER_SITE_OTHER): Payer: Medicare Other | Admitting: Family Medicine

## 2019-04-02 DIAGNOSIS — N184 Chronic kidney disease, stage 4 (severe): Secondary | ICD-10-CM

## 2019-04-02 DIAGNOSIS — E785 Hyperlipidemia, unspecified: Secondary | ICD-10-CM

## 2019-04-02 DIAGNOSIS — E1122 Type 2 diabetes mellitus with diabetic chronic kidney disease: Secondary | ICD-10-CM

## 2019-04-02 DIAGNOSIS — I10 Essential (primary) hypertension: Secondary | ICD-10-CM | POA: Diagnosis not present

## 2019-04-02 DIAGNOSIS — I129 Hypertensive chronic kidney disease with stage 1 through stage 4 chronic kidney disease, or unspecified chronic kidney disease: Secondary | ICD-10-CM

## 2019-04-02 NOTE — Progress Notes (Signed)
Patient ID: Yolanda Huffman, female   DOB: 1940/07/14, 78 y.o.   MRN: [id number]  This visit type was conducted due to national recommendations for restrictions regarding the COVID-19 pandemic in an effort to limit this patient's exposure and mitigate transmission in our community.   Virtual Visit via Telephone Note  I connected with Yolanda Huffman on 04/02/19 at 11:00 AM EDT by telephone and verified that I am speaking with the correct person using two identifiers.   I discussed the limitations, risks, security and privacy concerns of performing an evaluation and management service by telephone and the availability of in person appointments. I also discussed with the patient that there may be a patient responsible charge related to this service. The patient expressed understanding and agreed to proceed.  Location patient: home Location provider: work or home office Participants present for the call: patient, provider Patient did not have a visit in the prior 7 days to address this/these issue(s).   History of Present Illness: Yolanda Huffman is seen for routine medical follow-up.  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  Last spring she had severe exhaustion ended up getting a new pacemaker in June and has had some improvement since then.  She continues to be followed by cardiology and nephrology.  Blood sugars have remained stable on low-dose long-acting insulin.  Fasting blood sugar this morning 74.  No hypoglycemia.  No polyuria or polydipsia.  Her blood pressures been stable.  Appetite and weight stable.  Overdue for lipids.  Medications reviewed.  She states her edema is stable.  Still needs flu vaccine   Observations/Objective: Patient sounds cheerful and well on the phone. I do not appreciate any  SOB. Speech and thought processing are grossly intact. Patient reported vitals: Blood pressure 136/82, pulse 74, weight 140 pounds, fasting blood sugar 74  Assessment and Plan: #1 type 2 diabetes with history of good control. -Future lab for A1c  #2 hyperlipidemia.  Goal LDL less than 70 -Future lab for lipid panel  #3 hypertension.  Well controlled by home readings.  #4 health maintenance -Patient will call back to schedule flu vaccine when she gets her labs  Follow Up Instructions:  -Patient will call back to schedule her labs and flu vaccine   [id number] [id number] [id number] 11-20 [id number] 21-30 I did not refer this patient for an OV in the next 24 hours for this/these issue(s).  I discussed the assessment and treatment plan with the patient. The patient was provided an opportunity to ask questions and all were answered. The patient agreed with the plan and demonstrated an understanding of the instructions.   The patient was advised to call back or seek an in-person evaluation if the symptoms worsen or if the condition fails to improve as anticipated.  I provided 23 minutes of non-face-to-face time during this encounter.   Carolann Littler, MD

## 2019-04-05 ENCOUNTER — Other Ambulatory Visit (INDEPENDENT_AMBULATORY_CARE_PROVIDER_SITE_OTHER): Payer: Medicare Other

## 2019-04-05 ENCOUNTER — Other Ambulatory Visit: Payer: Self-pay

## 2019-04-05 DIAGNOSIS — Z23 Encounter for immunization: Secondary | ICD-10-CM

## 2019-04-05 DIAGNOSIS — I129 Hypertensive chronic kidney disease with stage 1 through stage 4 chronic kidney disease, or unspecified chronic kidney disease: Secondary | ICD-10-CM | POA: Diagnosis not present

## 2019-04-05 DIAGNOSIS — E1122 Type 2 diabetes mellitus with diabetic chronic kidney disease: Secondary | ICD-10-CM

## 2019-04-05 DIAGNOSIS — N184 Chronic kidney disease, stage 4 (severe): Secondary | ICD-10-CM

## 2019-04-05 DIAGNOSIS — E785 Hyperlipidemia, unspecified: Secondary | ICD-10-CM

## 2019-04-05 LAB — HEMOGLOBIN A1C: Hgb A1c MFr Bld: 6.6 % — ABNORMAL HIGH (ref 4.6–6.5)

## 2019-04-05 LAB — LIPID PANEL
Cholesterol: 143 mg/dL (ref 0–200)
HDL: 59.7 mg/dL (ref >39.00)
LDL Cholesterol: 56 mg/dL (ref 0–99)
NonHDL: 83.52
Total CHOL/HDL Ratio: 2
Triglycerides: 136 mg/dL (ref 0.0–149.0)
VLDL: 27.2 mg/dL (ref 0.0–40.0)

## 2019-04-17 ENCOUNTER — Telehealth: Payer: Self-pay | Admitting: Oncology

## 2019-04-17 NOTE — Telephone Encounter (Signed)
Returned patient's phone call regarding rescheduling an appointment, per patient's request 11/30 appointment has been moved to 12/01 due to patient's transportation.   Message to provider.

## 2019-04-21 NOTE — Telephone Encounter (Signed)
Not a problem  Please do not use "patient calls" as a way to reach me-- use "staff message" only  Thank you!  GM

## 2019-05-03 ENCOUNTER — Encounter: Payer: Self-pay | Admitting: Family Medicine

## 2019-05-03 ENCOUNTER — Other Ambulatory Visit: Payer: Self-pay | Admitting: Family Medicine

## 2019-05-27 ENCOUNTER — Ambulatory Visit: Payer: Medicare Other | Admitting: Oncology

## 2019-05-27 ENCOUNTER — Other Ambulatory Visit: Payer: Medicare Other

## 2019-05-27 ENCOUNTER — Ambulatory Visit: Payer: Medicare Other

## 2019-05-28 ENCOUNTER — Inpatient Hospital Stay: Payer: Medicare Other | Attending: Adult Health

## 2019-05-28 ENCOUNTER — Inpatient Hospital Stay: Payer: Medicare Other

## 2019-05-28 ENCOUNTER — Encounter: Payer: Self-pay | Admitting: Adult Health

## 2019-05-28 ENCOUNTER — Inpatient Hospital Stay: Payer: Medicare Other | Admitting: Adult Health

## 2019-05-28 ENCOUNTER — Other Ambulatory Visit: Payer: Self-pay

## 2019-05-28 VITALS — BP 150/71 | HR 70

## 2019-05-28 VITALS — BP 132/68 | HR 51 | Temp 98.2°F | Resp 18 | Ht 63.0 in | Wt 151.3 lb

## 2019-05-28 DIAGNOSIS — M810 Age-related osteoporosis without current pathological fracture: Secondary | ICD-10-CM

## 2019-05-28 DIAGNOSIS — Z170 Estrogen receptor positive status [ER+]: Secondary | ICD-10-CM | POA: Diagnosis not present

## 2019-05-28 DIAGNOSIS — D508 Other iron deficiency anemias: Secondary | ICD-10-CM

## 2019-05-28 DIAGNOSIS — D0512 Intraductal carcinoma in situ of left breast: Secondary | ICD-10-CM | POA: Diagnosis not present

## 2019-05-28 DIAGNOSIS — Z79811 Long term (current) use of aromatase inhibitors: Secondary | ICD-10-CM | POA: Insufficient documentation

## 2019-05-28 DIAGNOSIS — I5043 Acute on chronic combined systolic (congestive) and diastolic (congestive) heart failure: Secondary | ICD-10-CM

## 2019-05-28 DIAGNOSIS — D509 Iron deficiency anemia, unspecified: Secondary | ICD-10-CM | POA: Diagnosis present

## 2019-05-28 LAB — CBC WITH DIFFERENTIAL/PLATELET
Abs Immature Granulocytes: 0.01 10*3/uL (ref 0.00–0.07)
Basophils Absolute: 0 10*3/uL (ref 0.0–0.1)
Basophils Relative: 1 %
Eosinophils Absolute: 0.1 10*3/uL (ref 0.0–0.5)
Eosinophils Relative: 1 %
HCT: 30.6 % — ABNORMAL LOW (ref 36.0–46.0)
Hemoglobin: 9.9 g/dL — ABNORMAL LOW (ref 12.0–15.0)
Immature Granulocytes: 0 %
Lymphocytes Relative: 16 %
Lymphs Abs: 1.1 10*3/uL (ref 0.7–4.0)
MCH: 29.2 pg (ref 26.0–34.0)
MCHC: 32.4 g/dL (ref 30.0–36.0)
MCV: 90.3 fL (ref 80.0–100.0)
Monocytes Absolute: 0.5 10*3/uL (ref 0.1–1.0)
Monocytes Relative: 8 %
Neutro Abs: 5.2 10*3/uL (ref 1.7–7.7)
Neutrophils Relative %: 74 %
Platelets: 197 10*3/uL (ref 150–400)
RBC: 3.39 MIL/uL — ABNORMAL LOW (ref 3.87–5.11)
RDW: 13.3 % (ref 11.5–15.5)
WBC: 7 10*3/uL (ref 4.0–10.5)
nRBC: 0 % (ref 0.0–0.2)

## 2019-05-28 LAB — COMPREHENSIVE METABOLIC PANEL
ALT: 13 U/L (ref 0–44)
AST: 21 U/L (ref 15–41)
Albumin: 3.6 g/dL (ref 3.5–5.0)
Alkaline Phosphatase: 38 U/L (ref 38–126)
Anion gap: 15 (ref 5–15)
BUN: 50 mg/dL — ABNORMAL HIGH (ref 8–23)
CO2: 22 mmol/L (ref 22–32)
Calcium: 9 mg/dL (ref 8.9–10.3)
Chloride: 100 mmol/L (ref 98–111)
Creatinine, Ser: 3.28 mg/dL (ref 0.44–1.00)
GFR calc Af Amer: 15 mL/min — ABNORMAL LOW (ref >60)
GFR calc non Af Amer: 13 mL/min — ABNORMAL LOW (ref >60)
Glucose, Bld: 124 mg/dL — ABNORMAL HIGH (ref 70–99)
Potassium: 3.9 mmol/L (ref 3.5–5.1)
Sodium: 137 mmol/L (ref 135–145)
Total Bilirubin: 0.4 mg/dL (ref 0.3–1.2)
Total Protein: 7.4 g/dL (ref 6.5–8.1)

## 2019-05-28 LAB — BASIC METABOLIC PANEL
BUN: 50 — AB (ref 4–21)
Creatinine: 3.8 — AB (ref <=1.1)
Glucose: 124
Sodium: 137 (ref 137–147)

## 2019-05-28 LAB — HEPATIC FUNCTION PANEL: Bilirubin, Total: 0.4

## 2019-05-28 LAB — CBC AND DIFFERENTIAL: WBC: 7

## 2019-05-28 MED ORDER — SODIUM CHLORIDE 0.9 % IV SOLN
INTRAVENOUS | Status: DC
  Administered 2019-05-28: 13:00:00 via INTRAVENOUS
  Filled 2019-05-28: qty 250

## 2019-05-28 MED ORDER — DENOSUMAB 60 MG/ML ~~LOC~~ SOSY
PREFILLED_SYRINGE | SUBCUTANEOUS | Status: AC
  Filled 2019-05-28: qty 1

## 2019-05-28 MED ORDER — DENOSUMAB 60 MG/ML ~~LOC~~ SOSY
60.0000 mg | PREFILLED_SYRINGE | Freq: Once | SUBCUTANEOUS | Status: AC
  Administered 2019-05-28: 60 mg via SUBCUTANEOUS

## 2019-05-28 MED ORDER — SODIUM CHLORIDE 0.9 % IV SOLN
510.0000 mg | Freq: Once | INTRAVENOUS | Status: AC
  Administered 2019-05-28: 510 mg via INTRAVENOUS
  Filled 2019-05-28: qty 510

## 2019-05-28 NOTE — Patient Instructions (Signed)

## 2019-05-28 NOTE — Progress Notes (Signed)
[facility]  Telephone:([phone]) [phone] [phone]) [phone]     ID: LILLEY HUBBLE DOB: 12/20/1940  MR#: [id number]  [facility]#:[id number]  Patient Care Team: Eulas Post, MD as PCP - General (Family Medicine) Buford Dresser, MD as PCP - Cardiology (Cardiology) Donato Heinz, MD as Consulting Physician (Nephrology) Magrinat, Virgie Dad, MD as Consulting Physician (Oncology) Jovita Kussmaul, MD as Consulting Physician (General Surgery) Tahjanae Blankenburg, Charlestine Massed, NP as Nurse Practitioner (Hematology and Oncology) Mcarthur Rossetti, MD as Consulting Physician (Orthopedic Surgery) Mauri Pole, MD as Consulting Physician (Gastroenterology) Evans Lance, MD as Consulting Physician (Cardiology) Scot Dock, NP OTHER MD:   CHIEF COMPLAINT: Ductal carcinoma in situ   CURRENT TREATMENT: Anastrozole, denosumab/Prolia   INTERVAL HISTORY: Yolanda Huffman returns today for follow-up and treatment of her ductal carcinoma in situ.   She continues on anastrozole. She tolerates this well, without any noticeable side effects.    She also continues on denosumab/Prolia every 6 months, with another dose due today.  Yolanda Huffman's most recent bone density was on 08/16/2018 and showed osteoporosis with a T score of -2.9 in the left femoral neck.  Her most recent mammogram was on 12/11/2018 and showed no evidence of malignancy and breast density category b.    Yolanda Huffman has a h/o iron deficiency anemia and last received IV iron on 04/30/2018.  Her iron studies were repeated in May of this year and were low.  She is due for iron today to replace this.     REVIEW OF SYSTEMS: Yolanda Huffman is doing well today.  She denies any fever, chills, chest pain, palpitations, cough, shortness of breath, nausea, vomiting, headaches, bowel/bladder changes, or any other concerns.  She continues to follow appropriate pandemic precautions, and has maintained her normal level of activity, though she is not going out  often.   A detailed ROS was otherwise non contributory today.     BREAST CANCER HISTORY: From the original intake note:  The patient had routine bilateral screening mammography with tomography at the [facility] 07/06/2016, showing new calcifications in the left breast. The right breast was unremarkable. Unilateral left diagnostic mammography 07/27/2016 found the breast density to be category B. In the upper inner quadrant of the left breast there was a 0.7 cm group of pleomorphic calcifications.  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  Her subsequent history is as detailed below   PAST MEDICAL HISTORY: Past Medical History:  Diagnosis Date  . Anemia   . Arthritis    gout  . Atrial fibrillation ([place])    prior Sotalol - d/c'd 2/2 increased creatinine; rate control strategy  . CAD (coronary artery disease)    s/p PCI in [place] in 1/09  . Cancer [facility])    left breast  . Chronic combined systolic and diastolic heart failure (HCC)    Echocardiogram 3/12: Mild LVH, EF 35-36%, normal diastolic function, mild AI, mild MR, PASP 44, normal wall motion  . CKD (chronic kidney disease)    sees Dr. Burman Foster - Stage 3 ?  Marland Kitchen Diabetes mellitus   . Hyperlipemia   . Hypertension   . MI, old    2009  . Osteoporosis   . Pacemaker   . Pleural effusion   . Pneumonia   . PUD (peptic ulcer disease) 10/2010   duodenal ulcer    PAST SURGICAL HISTORY: Past Surgical  History:  Procedure Laterality Date  . BREAST BIOPSY Left 07/29/2016  . BREAST LUMPECTOMY Left 09/19/2016  . BREAST LUMPECTOMY WITH RADIOACTIVE SEED LOCALIZATION Left 09/19/2016   Procedure: LEFT BREAST LUMPECTOMY WITH RADIOACTIVE SEED LOCALIZATION;  Surgeon: Autumn Messing III, MD;  Location: [facility];  Service: General;  Laterality: Left;  . CATARACT EXTRACTION Bilateral    01/12/09 and  01/26/09 both eyes  . COLONOSCOPY    . CORONARY ANGIOPLASTY  2009  . PACEMAKER INSERTION  07/13/07  . PPM GENERATOR CHANGEOUT N/A 12/17/2018   Procedure: PPM GENERATOR CHANGEOUT;  Surgeon: Evans Lance, MD;  Location: [facility];  Service: Cardiovascular;  Laterality: N/A;  . stent implant  07/13/2007  . TOTAL HIP ARTHROPLASTY Right 07/23/2017   Procedure: TOTAL HIP ARTHROPLASTY ANTERIOR APPROACH;  Surgeon: Mcarthur Rossetti, MD;  Location: [facility];  Service: Orthopedics;  Laterality: Right;    FAMILY HISTORY Family History  Problem Relation Age of Onset  . Heart disease Father   . Heart attack Father   . Clotting disorder Father        blood clot  . Hypertension Father   . Heart disease Mother   . Breast cancer Maternal Aunt 11  . Breast cancer Sister 26  . Breast cancer Other 40  . Colon cancer Neg Hx    The patient's father died at age 12 from a myocardial infarction. The patient's mother died from multiple problems including pleurisy at the age of 35. The patient has 3 brothers, 2 sisters. One sister was diagnosed with breast cancer in her late 34s. The other sister had a "vaginal cancer" (? Cervical cancer). A maternal aunt was diagnosed with breast cancer in her 40s and her daughter, the patient's niece was diagnosed with breast cancer in her 39s.   GYNECOLOGIC HISTORY:  No LMP recorded. Patient is postmenopausal. Menarche age 22, first live birth age 13, the patient is GX P1. She stopped having periods in 1992. She did not take hormone replacement. She took oral contraceptives briefly remotely with no complications.    SOCIAL HISTORY: (Updated 07/18/2018) She is originally from [place]. 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 She is a widow. She lives with her cat (adopted at 57 year old on 08/06/2014), Chrissy, in a retirement community. Her daughter Kristie Cowman lives in [place]. She is a Runner, broadcasting/film/video. The patient has no grandchildren. She is not a church  attender    ADVANCED DIRECTIVES: The patient's daughter Alleen Borne is her healthcare power of attorney. Alleen Borne can be reached at [phone] or [phone]   HEALTH MAINTENANCE: Social History   Tobacco Use  . Smoking status: Former Smoker    Packs/day: 2.00    Years: 20.00    Pack years: 40.00    Types: Cigarettes    Quit date: 11/26/1991    Years since quitting: 27.5  . Smokeless tobacco: Never Used  Substance Use Topics  . Alcohol use: No  . Drug use: No     Colonoscopy:January 2017/ [facility]  PAP:  Bone density: March 2017 showed a T score of -2.7 (osteoporosis) right femoral neck   Allergies  Allergen Reactions  . Amlodipine Besylate Swelling    Current Outpatient Medications  Medication Sig Dispense Refill  . acetaminophen (TYLENOL) 500 MG tablet Take 500-1,000 mg by mouth daily as needed for moderate pain.    Marland Kitchen anastrozole (ARIMIDEX) 1 MG tablet TAKE 1 TABLET BY MOUTH DAILY (Patient taking differently: Take 1 mg by mouth  daily with lunch. ) 90 tablet 2  . atorvastatin (LIPITOR) 40 MG tablet TAKE ONE TABLET BY MOUTH DAILY 90 tablet 0  . calcium carbonate (TUMS - DOSED IN MG ELEMENTAL CALCIUM) 500 MG chewable tablet Chew 1 tablet by mouth See admin instructions. Tuesday, Thursday, Saturday and Sunday    . Cranberry 500 MG CAPS Take 500 mg by mouth daily with lunch.    . denosumab (PROLIA) 60 MG/ML SOSY injection Inject 60 mg into the skin every 6 (six) months.    . furosemide (LASIX) 40 MG tablet TAKE 2 TABLETS BY MOUTH EVERY MORNING AND 1 EVERY EVENING 270 tablet 0  . glucose blood test strip 1 each by Other route 3 (three) times daily. Use as instructed     . Insulin Pen Needle (BD PEN NEEDLE NANO U/F) 32G X 4 MM MISC Use to inject Levemir one time daily. Dx Code E11.9 100 each 1  . KLOR-CON M20 20 MEQ tablet TAKE TWO TABLETS BY MOUTH TWICE A DAY WITH FOOD AND WATER 360 tablet 3  . LEVEMIR FLEXTOUCH 100 UNIT/ML Pen INJECT 12 UNITS UNDER THE SKIN DAILY 15 mL 1  . metolazone  (ZAROXOLYN) 2.5 MG tablet TAKE ONE TABLET BY MOUTH DAILY EVERY MONDAY, WEDNESDAY, AND FRIDAY 30 tablet 2  . metoprolol succinate (TOPROL-XL) 25 MG 24 hr tablet TAKE TWO TABLETS  BY MOUTH EVERY MORNING AND TAKE ONE TABLET BY MOUTH EVERY EVENING (Patient taking differently: Take 25-50 mg by mouth See admin instructions. Take 50 mg in the morning and 25 mg in the evening) 270 tablet 2  . Multiple Vitamin (MULTIVITAMIN WITH MINERALS) TABS tablet Take 1 tablet by mouth daily. FOR ADULT 50+    . Omega-3 Fatty Acids (FISH OIL) 1000 MG CAPS Take 1,000 mg by mouth daily.     . pantoprazole (PROTONIX) 40 MG tablet TAKE ONE TABLET BY MOUTH DAILY (Patient taking differently: Take 40 mg by mouth daily. ) 90 tablet 4   No current facility-administered medications for this visit.     OBJECTIVE:  Vitals:   05/28/19 1208  BP: 132/68  Pulse: (!) 51  Resp: 18  Temp: 98.2 F (36.8 C)  SpO2: 94%     Body mass index is 26.8 kg/m.    ECOG FS:1 - Symptomatic but completely ambulatory  GENERAL: Patient is a well appearing female in no acute distress HEENT:  Sclerae anicteric.  Oropharynx clear and moist. No ulcerations or evidence of oropharyngeal candidiasis. Neck is supple.  NODES:  No cervical, supraclavicular, or axillary lymphadenopathy palpated.  BREAST EXAM: right breast benign, left breast s/p lumpectomy, no sign of local recurrence. LUNGS:  Clear to auscultation bilaterally.  No wheezes or rhonchi. HEART:  Regular rate and rhythm. No murmur appreciated. ABDOMEN:  Soft, nontender.  Positive, normoactive bowel sounds. No organomegaly palpated. MSK:  No focal spinal tenderness to palpation. Full range of motion bilaterally in the upper extremities. EXTREMITIES:  No peripheral edema.   SKIN:  Clear with no obvious rashes or skin changes. No nail dyscrasia. NEURO:  Nonfocal. Well oriented.  Appropriate affect.    LAB RESULTS:  CMP     Component Value Date/Time   NA 137 05/28/2019 1144   NA 135  (A) 09/05/2018   NA 134 (L) 04/18/2017 0950   K 3.9 05/28/2019 1144   K 3.4 (L) 04/18/2017 0950   CL 100 05/28/2019 1144   CO2 22 05/28/2019 1144   CO2 26 04/18/2017 0950   GLUCOSE 124 (H) 05/28/2019  1144   GLUCOSE 139 04/18/2017 0950   BUN 50 (H) 05/28/2019 1144   BUN 42 (A) 04/26/2018   BUN 53.9 (H) 04/18/2017 0950   CREATININE 3.28 (HH) 05/28/2019 1144   CREATININE 3.04 (HH) 11/22/2018 1022   CREATININE 2.4 (H) 04/18/2017 0950   CALCIUM 9.0 05/28/2019 1144   CALCIUM 9.1 04/18/2017 0950   PROT 7.4 05/28/2019 1144   PROT 7.4 04/18/2017 0950   ALBUMIN 3.6 05/28/2019 1144   ALBUMIN 3.4 (L) 04/18/2017 0950   AST 21 05/28/2019 1144   AST 20 11/22/2018 1022   AST 28 04/18/2017 0950   ALT 13 05/28/2019 1144   ALT 11 11/22/2018 1022   ALT 18 04/18/2017 0950   ALKPHOS 38 05/28/2019 1144   ALKPHOS 42 04/18/2017 0950   BILITOT 0.4 05/28/2019 1144   BILITOT 0.4 11/22/2018 1022   BILITOT 0.55 04/18/2017 0950   GFRNONAA 13 (L) 05/28/2019 1144   GFRNONAA 14 (L) 11/22/2018 1022   GFRAA 15 (L) 05/28/2019 1144   GFRAA 16 (L) 11/22/2018 1022    INo results found for: SPEP, UPEP  Lab Results  Component Value Date   WBC 7.0 05/28/2019   NEUTROABS 5.2 05/28/2019   HGB 9.9 (L) 05/28/2019   HCT 30.6 (L) 05/28/2019   MCV 90.3 05/28/2019   PLT 197 05/28/2019      Chemistry      Component Value Date/Time   NA 137 05/28/2019 1144   NA 135 (A) 09/05/2018   NA 134 (L) 04/18/2017 0950   K 3.9 05/28/2019 1144   K 3.4 (L) 04/18/2017 0950   CL 100 05/28/2019 1144   CO2 22 05/28/2019 1144   CO2 26 04/18/2017 0950   BUN 50 (H) 05/28/2019 1144   BUN 42 (A) 04/26/2018   BUN 53.9 (H) 04/18/2017 0950   CREATININE 3.28 (HH) 05/28/2019 1144   CREATININE 3.04 (HH) 11/22/2018 1022   CREATININE 2.4 (H) 04/18/2017 0950   GLU 116 09/05/2018      Component Value Date/Time   CALCIUM 9.0 05/28/2019 1144   CALCIUM 9.1 04/18/2017 0950   ALKPHOS 38 05/28/2019 1144   ALKPHOS 42 04/18/2017 0950    AST 21 05/28/2019 1144   AST 20 11/22/2018 1022   AST 28 04/18/2017 0950   ALT 13 05/28/2019 1144   ALT 11 11/22/2018 1022   ALT 18 04/18/2017 0950   BILITOT 0.4 05/28/2019 1144   BILITOT 0.4 11/22/2018 1022   BILITOT 0.55 04/18/2017 0950       No results found for: LABCA2  No components found for: VPXTG626  No results for input(s): INR in the last 168 hours.  Urinalysis    Component Value Date/Time   COLORURINE YELLOW 08/09/2018 2130   APPEARANCEUR CLEAR 08/09/2018 2130   LABSPEC 1.008 08/09/2018 2130   PHURINE 5.0 08/09/2018 2130   GLUCOSEU NEGATIVE 08/09/2018 2130   GLUCOSEU NEGATIVE 07/01/2011 1203   HGBUR SMALL (A) 08/09/2018 2130   BILIRUBINUR NEGATIVE 08/09/2018 2130   BILIRUBINUR neg 05/02/2013 [place] 08/09/2018 2130   PROTEINUR NEGATIVE 08/09/2018 2130   UROBILINOGEN 0.2 05/02/2013 1215   UROBILINOGEN 0.2 07/01/2011 1203   NITRITE NEGATIVE 08/09/2018 2130   LEUKOCYTESUR SMALL (A) 08/09/2018 2130     STUDIES:  CLINICAL DATA:  Left lumpectomy.  Annual mammography.  EXAM: DIGITAL DIAGNOSTIC BILATERAL MAMMOGRAM WITH CAD AND TOMO  COMPARISON:  Previous exam(s).  ACR Breast Density Category b: There are scattered areas of fibroglandular density.  FINDINGS: The left lumpectomy site  appears as expected. No suspicious lumps, calcifications, or distortion in either breast.  Mammographic images were processed with CAD.  IMPRESSION: Stable left lumpectomy changes. No evidence of malignancy in either breast.  RECOMMENDATION: Annual diagnostic mammography.  I have discussed the findings and recommendations with the patient. Results were also provided in writing at the conclusion of the visit. If applicable, a reminder letter will be sent to the patient regarding the next appointment.  BI-RADS CATEGORY  2: Benign.   Electronically Signed   By: Dorise Bullion III M.D   On: 12/11/2018 10:35    ELIGIBLE FOR  AVAILABLE RESEARCH PROTOCOL: No  ASSESSMENT: 78 y.o. [place] woman status post left breast upper inner quadrant biopsy 07/29/2016 for ductal carcinoma in situ, grade 2, estrogen and progesterone receptor positive  (1) Genetics testing 08/23/2016 through the Multi-Gene Panel offered by [facility] found no deleterious mutations in ALK, APC, ATM, AXIN2,BAP1,  BARD1, BLM, BMPR1A, BRCA1, BRCA2, BRIP1, CASR, CDC73, CDH1, CDK4, CDKN1B, CDKN1C, CDKN2A (p14ARF), CDKN2A (p16INK4a), CEBPA, CHEK2, DICER1, CIS3L2, EGFR (c.2369C>T, p.Thr790Met variant only), EPCAM (Deletion/duplication testing only), FH, FLCN, GATA2, GPC3, GREM1 (Promoter region deletion/duplication testing only), HOXB13 (c.251G>A, p.Gly84Glu), HRAS, KIT, MAX, MEN1, MET, MITF (c.952G>A, p.Glu318Lys variant only), MLH1, MSH2, MSH6, MUTYH, NBN, NF1, NF2, PALB2, PDGFRA, PHOX2B, PMS2, POLD1, POLE, POT1, PRKAR1A, PTCH1, PTEN, RAD50, RAD51C, RAD51D, RB1, RECQL4, RET, RUNX1, SDHAF2, SDHA (sequence changes only), SDHB, SDHC, SDHD, SMAD4, SMARCA4, SMARCB1, SMARCE1, STK11, SUFU, TERT, TERT, TMEM127, TP53, TSC1, TSC2, VHL, WRN and WT1  (2) considered the COMET trial: patient declines  (3) left lumpectomy 09/19/2016 found ductal carcinoma in situ, grade 2, measuring 0.6 cm, with negative margins.  (4) advised against adjuvant radiation since there would be no survival advantage in this setting and the patient's pacemaker would have to be moved  (5) anastrozole started 09/27/2016  (a) bone density 08/25/2015 found a T score of -2.7, consistent with osteoporosis   (b) denosumab/Prolia started 10/25/2016, repeated every 24 weeks  (c) repeat bone density 08/16/2018 shows a T score of -2.9  (4) iron deficiency anemia  (a) status post Feraheme 04/30/2018  (b) EGD 07/03/2018 showed candidal esophagitis  (b) Feraheme on 05/28/2019      PLAN: Yolanda Huffman is doing well today.  She is clinically and radiographically without any sign of breast cancer recurrence.  She  continues on Anastrozole daily and is tolerating this well.  I reviewed with her that she will continue this until 09/2021.    Yolanda Huffman will repeat her mammogram in 11/2019.  She has osteoporosis and will continue on Prolia every 6 months.  She tolerates this well, and is also taking calcium, particularly on the day she receives her injection.   Yolanda Huffman and I reviewed her iron deficiency and the fact that she typically gets one dose of feraheme for her iron deficiency (also due to iron levels responding with one dose previously and current covid19 pandemic).  She will proceed with this today, and we will see her back in 6 months for labs and f/u, along with her next injection.    She was recommended to continue with the appropriate pandemic precautions. She knows to call for any questions that may arise between now and her next appointment.  We are happy to see her sooner if needed.  A total of (30) minutes of face-to-face time was spent with this patient with greater than 50% of that time in counseling and care-coordination.   Wilber Bihari, NP  05/28/19 12:37 PM Medical Oncology and Hematology  [facility] [place], [place] [address] Tel. [phone]    Fax. [phone]

## 2019-05-29 ENCOUNTER — Other Ambulatory Visit: Payer: Self-pay | Admitting: Cardiology

## 2019-06-08 ENCOUNTER — Other Ambulatory Visit: Payer: Self-pay | Admitting: Family Medicine

## 2019-06-14 ENCOUNTER — Telehealth: Payer: Self-pay

## 2019-06-14 NOTE — Telephone Encounter (Signed)
Pt had questions about her upcoming home remote appointment. I told the pt all she had to do is sleep near her home remote monitor and it will send automatically by itself. I gave her my direct office number to call to make sure it came thru. The pt thanked me for helping putting her mind to ease.

## 2019-06-18 ENCOUNTER — Ambulatory Visit (INDEPENDENT_AMBULATORY_CARE_PROVIDER_SITE_OTHER): Payer: Medicare Other | Admitting: *Deleted

## 2019-06-18 DIAGNOSIS — I495 Sick sinus syndrome: Secondary | ICD-10-CM

## 2019-06-18 LAB — CUP PACEART REMOTE DEVICE CHECK
Battery Remaining Longevity: 105 mo
Battery Voltage: 3.07 V
Brady Statistic AP VP Percent: 0 %
Brady Statistic AP VS Percent: 0 %
Brady Statistic AS VP Percent: 99.62 %
Brady Statistic AS VS Percent: 0.38 %
Brady Statistic RA Percent Paced: 0 %
Brady Statistic RV Percent Paced: 99.62 %
Date Time Interrogation Session: 20201222034124
Implantable Lead Implant Date: 20090116
Implantable Lead Implant Date: 20090116
Implantable Lead Location: 753859
Implantable Lead Location: 753860
Implantable Lead Model: 5076
Implantable Lead Model: 5076
Implantable Pulse Generator Implant Date: 20200622
Lead Channel Impedance Value: 285 Ohm
Lead Channel Impedance Value: 285 Ohm
Lead Channel Impedance Value: 361 Ohm
Lead Channel Impedance Value: 380 Ohm
Lead Channel Pacing Threshold Amplitude: 1.625 V
Lead Channel Pacing Threshold Pulse Width: 0.4 ms
Lead Channel Sensing Intrinsic Amplitude: 0.75 mV
Lead Channel Sensing Intrinsic Amplitude: 2.625 mV
Lead Channel Sensing Intrinsic Amplitude: 2.625 mV
Lead Channel Setting Pacing Amplitude: 3.25 V
Lead Channel Setting Pacing Pulse Width: 0.4 ms
Lead Channel Setting Sensing Sensitivity: 1.2 mV

## 2019-08-07 ENCOUNTER — Encounter: Payer: Self-pay | Admitting: Family Medicine

## 2019-08-12 ENCOUNTER — Encounter: Payer: Self-pay | Admitting: Family Medicine

## 2019-08-12 ENCOUNTER — Ambulatory Visit (INDEPENDENT_AMBULATORY_CARE_PROVIDER_SITE_OTHER): Payer: Medicare PPO

## 2019-08-12 VITALS — BP 134/82 | HR 72 | Ht 63.0 in | Wt 151.0 lb

## 2019-08-12 DIAGNOSIS — Z0000 Encounter for general adult medical examination without abnormal findings: Secondary | ICD-10-CM | POA: Diagnosis not present

## 2019-08-12 NOTE — Progress Notes (Signed)
This visit is being conducted via phone call due to the COVID-19 pandemic. This patient has given me verbal consent via phone to conduct this visit, patient states they are participating from their home address. Some vital signs may be absent or patient reported.   Patient identification: identified by name, DOB, and current address.  Location provider:  [name] Persons participating in the virtual visit: Yolanda Huffman and Yolanda Forts, LPN.   Subjective:   Yolanda Huffman is a 79 y.o. female who presents for Medicare Annual (Subsequent) preventive examination.  Yolanda Huffman is doing well at this time. She states she eats 2 meals per day and is getting a little bit of exercise by walking around her apartment outside. She has received both covid vaccines.   Review of Systems:  No ROS; Annual Wellness Visit today.  Cardiac Risk Factors include: hypertension;dyslipidemia;advanced age (>40men, >75 women);diabetes mellitus;sedentary lifestyle    Objective:    Vitals: BP 134/82 (Patient Position: Sitting)   Pulse 72   Wt 151 lb (68.5 kg)   BMI 26.75 kg/m   Body mass index is 26.75 kg/m.  Advanced Directives 08/12/2019 12/17/2018 08/11/2018 07/24/2018 04/19/2018 04/17/2018 04/17/2018  Does Patient Have a Medical Advance Directive? Yes Yes No No Yes Yes Yes  Type of Advance Directive - Living will - - - [name];Living will [name];Living will  Does patient want to make changes to medical advance directive? No - Patient declined No - Patient declined - - - No - Patient declined -  Copy of [name] in Chart? - - - - - No - copy requested -  Would patient like information on creating a medical advance directive? - - No - Patient declined No - Patient declined - - -    Tobacco Social History   Tobacco Use  Smoking Status Former Smoker  . Packs/day: 2.00  . Years: 20.00  . Pack years: 40.00  . Types: Cigarettes  . Quit  date: 11/26/1991  . Years since quitting: 27.7  Smokeless Tobacco Never Used     Counseling given: Not Answered   Clinical Intake:  Pre-visit preparation completed: Yes  Pain : No/denies pain     BMI - recorded: 26.75 Nutritional Status: BMI 25 -29 Overweight Nutritional Risks: None Diabetes: Yes CBG done?: Yes(98 fasting this morning) CBG resulted in Enter/ Edit results?: No Did pt. bring in CBG monitor from home?: No(N/A telephone visit)  How often do you need to have someone help you when you read instructions, pamphlets, or other written materials from your doctor or pharmacy?: 1 - Never  Interpreter Needed?: No  Information entered by :: Yolanda Forts, LPN.  Past Medical History:  Diagnosis Date  . Anemia   . Arthritis    gout  . Atrial fibrillation ([place])    prior Sotalol - d/c'd 2/2 increased creatinine; rate control strategy  . CAD (coronary artery disease)    s/p PCI in [place] in 1/09  . Cancer [facility])    left breast  . Chronic combined systolic and diastolic heart failure (HCC)    Echocardiogram 3/12: Mild LVH, EF 45-80%, normal diastolic function, mild AI, mild MR, PASP 44, normal wall motion  . CKD (chronic kidney disease)    sees Dr. Burman Foster - Stage 3 ?  Marland Kitchen Diabetes mellitus   . Hyperlipemia   . Hypertension   . MI, old    2009  . Osteoporosis   . Pacemaker   .  Pleural effusion   . Pneumonia   . PUD (peptic ulcer disease) 10/2010   duodenal ulcer   Past Surgical History:  Procedure Laterality Date  . BREAST BIOPSY Left 07/29/2016  . BREAST LUMPECTOMY Left 09/19/2016  . BREAST LUMPECTOMY WITH RADIOACTIVE SEED LOCALIZATION Left 09/19/2016   Procedure: LEFT BREAST LUMPECTOMY WITH RADIOACTIVE SEED LOCALIZATION;  Surgeon: Autumn Messing III, MD;  Location: [facility];  Service: General;  Laterality: Left;  . CATARACT EXTRACTION Bilateral    01/12/09 and 01/26/09 both eyes  . COLONOSCOPY    . CORONARY ANGIOPLASTY  2009  . PACEMAKER INSERTION  07/13/07  . PPM  GENERATOR CHANGEOUT N/A 12/17/2018   Procedure: PPM GENERATOR CHANGEOUT;  Surgeon: Evans Lance, MD;  Location: [facility];  Service: Cardiovascular;  Laterality: N/A;  . stent implant  07/13/2007  . TOTAL HIP ARTHROPLASTY Right 07/23/2017   Procedure: TOTAL HIP ARTHROPLASTY ANTERIOR APPROACH;  Surgeon: Mcarthur Rossetti, MD;  Location: [facility];  Service: Orthopedics;  Laterality: Right;   Family History  Problem Relation Age of Onset  . Heart disease Father   . Heart attack Father   . Clotting disorder Father        blood clot  . Hypertension Father   . Heart disease Mother   . Breast cancer Maternal Aunt 33  . Breast cancer Sister 64  . Breast cancer Other 40  . Colon cancer Neg Hx    Social History   Socioeconomic History  . Marital status: Widowed    Spouse name: Not on file  . Number of children: 1  . Years of education: Not on file  . Highest education level: Not on file  Occupational History  . Occupation: retired Theatre stage manager: RETIRED  Tobacco Use  . Smoking status: Former Smoker    Packs/day: 2.00    Years: 20.00    Pack years: 40.00    Types: Cigarettes    Quit date: 11/26/1991    Years since quitting: 27.7  . Smokeless tobacco: Never Used  Substance and Sexual Activity  . Alcohol use: No  . Drug use: No  . Sexual activity: Not on file  Other Topics Concern  . Not on file  Social History Narrative   Widowed   [place] 1 resides in senior apartment    1 daughter local; no grandchildren      Social Determinants of Health   Financial Resource Strain: Low Risk   . Difficulty of Paying Living Expenses: Not hard at all  Food Insecurity: No Food Insecurity  . Worried About Charity fundraiser in the Last Year: Never true  . Ran Out of Food in the Last Year: Never true  Transportation Needs: No Transportation Needs  . Lack of Transportation (Medical): No  . Lack of Transportation (Non-Medical): No  Physical Activity: Insufficiently Active  .  Days of Exercise per Week: 4 days  . Minutes of Exercise per Session: 20 min  Stress:   . Feeling of Stress : Not on file  Social Connections: Somewhat Isolated  . Frequency of Communication with Friends and Family: More than three times a week  . Frequency of Social Gatherings with Friends and Family: Once a week  . Attends Religious Services: Never  . Active Member of Clubs or Organizations: Yes  . Attends Archivist Meetings: Never  . Marital Status: Widowed    Outpatient Encounter Medications as of 08/12/2019  Medication Sig  . acetaminophen (TYLENOL)  500 MG tablet Take 500-1,000 mg by mouth daily as needed for moderate pain.  Marland Kitchen anastrozole (ARIMIDEX) 1 MG tablet TAKE 1 TABLET BY MOUTH DAILY (Patient taking differently: Take 1 mg by mouth daily with lunch. )  . atorvastatin (LIPITOR) 40 MG tablet TAKE ONE TABLET BY MOUTH DAILY  . calcium carbonate (TUMS - DOSED IN MG ELEMENTAL CALCIUM) 500 MG chewable tablet Chew 1 tablet by mouth See admin instructions. Tuesday, Thursday, Saturday and Sunday  . Cranberry 500 MG CAPS Take 500 mg by mouth daily with lunch.  . denosumab (PROLIA) 60 MG/ML SOSY injection Inject 60 mg into the skin every 6 (six) months.  . furosemide (LASIX) 40 MG tablet TAKE TWO TABLETS BY MOUTH EVERY MORNING AND 1 TABLET IN THE EVENING  . glucose blood test strip 1 each by Other route 3 (three) times daily. Use as instructed   . Insulin Pen Needle (BD PEN NEEDLE NANO U/F) 32G X 4 MM MISC Use to inject Levemir one time daily. Dx Code E11.9  . KLOR-CON M20 20 MEQ tablet TAKE TWO TABLETS BY MOUTH TWICE A DAY WITH FOOD AND WATER  . LEVEMIR FLEXTOUCH 100 UNIT/ML Pen INJECT 12 UNITS UNDER THE SKIN DAILY  . metolazone (ZAROXOLYN) 2.5 MG tablet TAKE ONE TABLET BY MOUTH DAILY EVERY MONDAY, WEDNESDAY, AND FRIDAY  . metoprolol succinate (TOPROL-XL) 25 MG 24 hr tablet TAKE TWO TABLETS  BY MOUTH EVERY MORNING AND TAKE ONE TABLET BY MOUTH EVERY EVENING (Patient taking  differently: Take 25-50 mg by mouth See admin instructions. Take 50 mg in the morning and 25 mg in the evening)  . Multiple Vitamin (MULTIVITAMIN WITH MINERALS) TABS tablet Take 1 tablet by mouth daily. FOR ADULT 50+  . Omega-3 Fatty Acids (FISH OIL) 1000 MG CAPS Take 1,000 mg by mouth daily.   . pantoprazole (PROTONIX) 40 MG tablet TAKE ONE TABLET BY MOUTH DAILY   No facility-administered encounter medications on file as of 08/12/2019.    Activities of Daily Living In your present state of health, do you have any difficulty performing the following activities: 08/12/2019  Hearing? N  Vision? N  Difficulty concentrating or making decisions? N  Walking or climbing stairs? Y  Dressing or bathing? N  Doing errands, shopping? N  Preparing Food and eating ? N  Using the Toilet? N  In the past six months, have you accidently leaked urine? N  Do you have problems with loss of bowel control? N  Managing your Medications? N  Managing your Finances? N  Housekeeping or managing your Housekeeping? N  Some recent data might be hidden    Patient Care Team: Eulas Post, MD as PCP - General (Family Medicine) Buford Dresser, MD as PCP - Cardiology (Cardiology) Donato Heinz, MD as Consulting Physician (Nephrology) Magrinat, Virgie Dad, MD as Consulting Physician (Oncology) Jovita Kussmaul, MD as Consulting Physician (General Surgery) Causey, Charlestine Massed, NP as Nurse Practitioner (Hematology and Oncology) Mcarthur Rossetti, MD as Consulting Physician (Orthopedic Surgery) Mauri Pole, MD as Consulting Physician (Gastroenterology) Evans Lance, MD as Consulting Physician (Cardiology)    Assessment:   This is a routine wellness examination for Jo.  Exercise Activities and Dietary recommendations Current Exercise Habits: The patient does not participate in regular exercise at present, Exercise limited by: None identified  Goals   None     Fall Risk  Fall Risk  08/12/2019 08/29/2017 02/03/2017 08/06/2015 08/06/2015  Falls in the past year? 0 Yes No No No  Number falls in past yr: - 1 - - -  Injury with Fall? - Yes - - -  Risk for fall due to : Medication side effect - - - -  Follow up Falls evaluation completed;Education provided;Falls prevention discussed - - - -   Is the patient's home free of loose throw rugs in walkways, pet beds, electrical cords, etc?   yes      Grab bars in the bathroom? yes      Handrails on the stairs?   yes      Adequate lighting?   yes  Timed Get Up and Go performed: N/A due to telephone visit  Depression Screen PHQ 2/9 Scores 08/12/2019 04/27/2018 02/03/2017 08/06/2015  PHQ - 2 Score 0 0 0 0  PHQ- 9 Score - 0 - -     Cognitive Function     6CIT Screen 08/12/2019  What Year? 0 points  What month? 0 points  What time? 0 points  Count back from 20 0 points  Months in reverse 0 points  Repeat phrase 0 points  Total Score 0    Immunization History  Administered Date(s) Administered  . Fluad Quad(high Dose 65+) 04/05/2019  . Influenza Split 03/29/2012  . Influenza Whole 04/07/2010  . Influenza, High Dose Seasonal PF 05/06/2016, 06/14/2017, 04/27/2018  . Influenza,inj,Quad PF,6+ Mos 03/29/2013, 04/03/2014  . Influenza,inj,Quad PF,6-35 Mos 08/06/2015  . Pneumococcal Conjugate-13 08/06/2015  . Pneumococcal Polysaccharide-23 04/07/2010    Qualifies for Shingles Vaccine?yes  Screening Tests Health Maintenance  Topic Date Due  . TETANUS/TDAP  07/12/1959  . OPHTHALMOLOGY EXAM  09/01/2016  . FOOT EXAM  11/04/2017  . URINE MICROALBUMIN  06/14/2037 (Originally 03/31/2012)  . HEMOGLOBIN A1C  10/04/2019  . INFLUENZA VACCINE  Completed  . DEXA SCAN  Completed  . PNA vac Low Risk Adult  Completed    Cancer Screenings: Lung: Low Dose CT Chest recommended if Age 62-80 years, 30 pack-year currently smoking OR have quit w/in 15years. Patient does not qualify. Breast:  Up to date on Mammogram? Yes   Up to date  of Bone Density/Dexa? Yes Colorectal: aged out   Additional Screenings:  Hepatitis C Screening: N/A due to age.     Plan:   Ms. Kendzierski plans to call her insurance company to determine out of pocket costs for updated Tdap and shingrix vaccines. She just completed her second covid vaccine on 08/06/19 and should wait a minimum of 30 days before having any other immunizations.   I have personally reviewed and noted the following in the patient's chart:   . Medical and social history . Use of alcohol, tobacco or illicit drugs  . Current medications and supplements . Functional ability and status . Nutritional status . Physical activity . Advanced directives . List of other physicians . Hospitalizations, surgeries, and ER visits in previous 12 months . Vitals . Screenings to include cognitive, depression, and falls . Referrals and appointments  In addition, I have reviewed and discussed with patient certain preventive protocols, quality metrics, and best practice recommendations. A written personalized care plan for preventive services as well as general preventive health recommendations were provided to patient.     Yolanda Forts, LPN  6/31/4970

## 2019-08-12 NOTE — Patient Instructions (Addendum)
[name] , Thank you for taking time to participate in your Medicare Wellness Visit. I appreciate your ongoing commitment to your health goals. Please review the following plan we discussed and let me know if I can assist you in the future.   Screening recommendations/referrals: Colorectal Screening: colonoscopy done 07/30/2015; no longer necessary due to advanced age.  Mammogram: completed 12/11/2018; due again 12/11/2019.  Bone Density: completed 08/16/2018. Due again 08/17/2020.   Vision and Dental Exams: Recommended annual ophthalmology exams for early detection of glaucoma and other disorders of the eye Recommended annual dental exams for proper oral hygiene  Diabetic Exams: Diabetic Eye Exam: > 3 years since last one. Encouraged her to schedule as soon as possible.  Diabetic Foot Exam: Needs to be done.   Vaccinations: Influenza vaccine: completed 04/05/2019; due again in Fall 2021.  Pneumococcal vaccine: completed 04/07/2010 and 08/06/2015. Up to date.  Tdap vaccine: no record of this in your chart. Suggested you call your insurance company to determine your out of pocket cost for this. Shingles vaccine: Please call your insurance company to determine your out of pocket expense for the Shingrix vaccine. You may receive this vaccine at your local pharmacy.  Advanced directives: Advance directives discussed with you today.  Please bring a copy of your POA (Power of [place]) and/or Living Will to your next appointment.  Goals: Recommend to drink at least 6-8 8oz glasses of water per day.  Recommend to exercise for at least 150 minutes per week.  Recommend to remove any items from the home that may cause slips or trips.  Next appointment: Please schedule your Annual Wellness Visit with your Nurse Health Advisor in one year.  Preventive Care 49 Years and Older, Female Preventive care refers to lifestyle choices and visits with your health care provider that can promote health  and wellness. What does preventive care include?  A yearly physical exam. This is also called an annual well check.  Dental exams once or twice a year.  Routine eye exams. Ask your health care provider how often you should have your eyes checked.  Personal lifestyle choices, including:  Daily care of your teeth and gums.  Regular physical activity.  Eating a healthy diet.  Avoiding tobacco and drug use.  Limiting alcohol use.  Practicing safe sex.  Taking low-dose aspirin every day if recommended by your health care provider.  Taking vitamin and mineral supplements as recommended by your health care provider. What happens during an annual well check? The services and screenings done by your health care provider during your annual well check will depend on your age, overall health, lifestyle risk factors, and family history of disease. Counseling  Your health care provider may ask you questions about your:  Alcohol use.  Tobacco use.  Drug use.  Emotional well-being.  Home and relationship well-being.  Sexual activity.  Eating habits.  History of falls.  Memory and ability to understand (cognition).  Work and work Statistician.  Reproductive health. Screening  You may have the following tests or measurements:  Height, weight, and BMI.  Blood pressure.  Lipid and cholesterol levels. These may be checked every 5 years, or more frequently if you are over 52 years old.  Skin check.  Lung cancer screening. You may have this screening every year starting at age 93 if you have a 30-pack-year history of smoking and currently smoke or have quit within the past 15 years.  Fecal occult blood test (FOBT) of the stool. You  may have this test every year starting at age 59.  Flexible sigmoidoscopy or colonoscopy. You may have a sigmoidoscopy every 5 years or a colonoscopy every 10 years starting at age 20.  Hepatitis C blood test.  Hepatitis B blood  test.  Sexually transmitted disease (STD) testing.  Diabetes screening. This is done by checking your blood sugar (glucose) after you have not eaten for a while (fasting). You may have this done every 1-3 years.  Bone density scan. This is done to screen for osteoporosis. You may have this done starting at age 7.  Mammogram. This may be done every 1-2 years. Talk to your health care provider about how often you should have regular mammograms. Talk with your health care provider about your test results, treatment options, and if necessary, the need for more tests. Vaccines  Your health care provider may recommend certain vaccines, such as:  Influenza vaccine. This is recommended every year.  Tetanus, diphtheria, and acellular pertussis (Tdap, Td) vaccine. You may need a Td booster every 10 years.  Zoster vaccine. You may need this after age 95.  Pneumococcal 13-valent conjugate (PCV13) vaccine. One dose is recommended after age 25.  Pneumococcal polysaccharide (PPSV23) vaccine. One dose is recommended after age 13. Talk to your health care provider about which screenings and vaccines you need and how often you need them. This information is not intended to replace advice given to you by your health care provider. Make sure you discuss any questions you have with your health care provider. Document Released: 07/10/2015 Document Revised: 03/02/2016 Document Reviewed: 04/14/2015 Elsevier Interactive Patient Education  2017 [place] Prevention in the Home Falls can cause injuries. They can happen to people of all ages. There are many things you can do to make your home safe and to help prevent falls. What can I do on the outside of my home?  Regularly fix the edges of walkways and driveways and fix any cracks.  Remove anything that might make you trip as you walk through a door, such as a raised step or threshold.  Trim any bushes or trees on the path to your home.  Use  bright outdoor lighting.  Clear any walking paths of anything that might make someone trip, such as rocks or tools.  Regularly check to see if handrails are loose or broken. Make sure that both sides of any steps have handrails.  Any raised decks and porches should have guardrails on the edges.  Have any leaves, snow, or ice cleared regularly.  Use sand or salt on walking paths during winter.  Clean up any spills in your garage right away. This includes oil or grease spills. What can I do in the bathroom?  Use night lights.  Install grab bars by the toilet and in the tub and shower. Do not use towel bars as grab bars.  Use non-skid mats or decals in the tub or shower.  If you need to sit down in the shower, use a plastic, non-slip stool.  Keep the floor dry. Clean up any water that spills on the floor as soon as it happens.  Remove soap buildup in the tub or shower regularly.  Attach bath mats securely with double-sided non-slip rug tape.  Do not have throw rugs and other things on the floor that can make you trip. What can I do in the bedroom?  Use night lights.  Make sure that you have a light by your bed that is  easy to reach.  Do not use any sheets or blankets that are too big for your bed. They should not hang down onto the floor.  Have a firm chair that has side arms. You can use this for support while you get dressed.  Do not have throw rugs and other things on the floor that can make you trip. What can I do in the kitchen?  Clean up any spills right away.  Avoid walking on wet floors.  Keep items that you use a lot in easy-to-reach places.  If you need to reach something above you, use a strong step stool that has a grab bar.  Keep electrical cords out of the way.  Do not use floor polish or wax that makes floors slippery. If you must use wax, use non-skid floor wax.  Do not have throw rugs and other things on the floor that can make you trip. What can  I do with my stairs?  Do not leave any items on the stairs.  Make sure that there are handrails on both sides of the stairs and use them. Fix handrails that are broken or loose. Make sure that handrails are as long as the stairways.  Check any carpeting to make sure that it is firmly attached to the stairs. Fix any carpet that is loose or worn.  Avoid having throw rugs at the top or bottom of the stairs. If you do have throw rugs, attach them to the floor with carpet tape.  Make sure that you have a light switch at the top of the stairs and the bottom of the stairs. If you do not have them, ask someone to add them for you. What else can I do to help prevent falls?  Wear shoes that:  Do not have high heels.  Have rubber bottoms.  Are comfortable and fit you well.  Are closed at the toe. Do not wear sandals.  If you use a stepladder:  Make sure that it is fully opened. Do not climb a closed stepladder.  Make sure that both sides of the stepladder are locked into place.  Ask someone to hold it for you, if possible.  Clearly mark and make sure that you can see:  Any grab bars or handrails.  First and last steps.  Where the edge of each step is.  Use tools that help you move around (mobility aids) if they are needed. These include:  Canes.  Walkers.  Scooters.  Crutches.  Turn on the lights when you go into a dark area. Replace any light bulbs as soon as they burn out.  Set up your furniture so you have a clear path. Avoid moving your furniture around.  If any of your floors are uneven, fix them.  If there are any pets around you, be aware of where they are.  Review your medicines with your doctor. Some medicines can make you feel dizzy. This can increase your chance of falling. Ask your doctor what other things that you can do to help prevent falls. This information is not intended to replace advice given to you by your health care provider. Make sure you  discuss any questions you have with your health care provider. Document Released: 04/09/2009 Document Revised: 11/19/2015 Document Reviewed: 07/18/2014 Elsevier Interactive Patient Education  2017 [employer].

## 2019-08-13 ENCOUNTER — Ambulatory Visit (INDEPENDENT_AMBULATORY_CARE_PROVIDER_SITE_OTHER): Payer: Medicare PPO | Admitting: Family Medicine

## 2019-08-13 ENCOUNTER — Other Ambulatory Visit: Payer: Self-pay

## 2019-08-13 ENCOUNTER — Encounter: Payer: Self-pay | Admitting: Family Medicine

## 2019-08-13 VITALS — BP 132/74 | HR 74 | Temp 96.3°F | Ht 63.0 in | Wt 153.0 lb

## 2019-08-13 DIAGNOSIS — N184 Chronic kidney disease, stage 4 (severe): Secondary | ICD-10-CM | POA: Diagnosis not present

## 2019-08-13 DIAGNOSIS — E785 Hyperlipidemia, unspecified: Secondary | ICD-10-CM

## 2019-08-13 DIAGNOSIS — I129 Hypertensive chronic kidney disease with stage 1 through stage 4 chronic kidney disease, or unspecified chronic kidney disease: Secondary | ICD-10-CM | POA: Diagnosis not present

## 2019-08-13 DIAGNOSIS — I10 Essential (primary) hypertension: Secondary | ICD-10-CM | POA: Diagnosis not present

## 2019-08-13 DIAGNOSIS — E1122 Type 2 diabetes mellitus with diabetic chronic kidney disease: Secondary | ICD-10-CM | POA: Diagnosis not present

## 2019-08-13 LAB — POCT GLYCOSYLATED HEMOGLOBIN (HGB A1C): Hemoglobin A1C: 6.5 % — AB (ref 4.0–5.6)

## 2019-08-13 NOTE — Patient Instructions (Signed)
Diabetes Mellitus and Foot Care Foot care is an important part of your health, especially when you have diabetes. Diabetes may cause you to have problems because of poor blood flow (circulation) to your feet and legs, which can cause your skin to:  Become thinner and drier.  Break more easily.  Heal more slowly.  Peel and crack. You may also have nerve damage (neuropathy) in your legs and feet, causing decreased feeling in them. This means that you may not notice minor injuries to your feet that could lead to more serious problems. Noticing and addressing any potential problems early is the best way to prevent future foot problems. How to care for your feet Foot hygiene  Wash your feet daily with warm water and mild soap. Do not use hot water. Then, pat your feet and the areas between your toes until they are completely dry. Do not soak your feet as this can dry your skin.  Trim your toenails straight across. Do not dig under them or around the cuticle. File the edges of your nails with an emery board or nail file.  Apply a moisturizing lotion or petroleum jelly to the skin on your feet and to dry, brittle toenails. Use lotion that does not contain alcohol and is unscented. Do not apply lotion between your toes. Shoes and socks  Wear clean socks or stockings every day. Make sure they are not too tight. Do not wear knee-high stockings since they may decrease blood flow to your legs.  Wear shoes that fit properly and have enough cushioning. Always look in your shoes before you put them on to be sure there are no objects inside.  To break in new shoes, wear them for just a few hours a day. This prevents injuries on your feet. Wounds, scrapes, corns, and calluses  Check your feet daily for blisters, cuts, bruises, sores, and redness. If you cannot see the bottom of your feet, use a mirror or ask someone for help.  Do not cut corns or calluses or try to remove them with medicine.  If you  find a minor scrape, cut, or break in the skin on your feet, keep it and the skin around it clean and dry. You may clean these areas with mild soap and water. Do not clean the area with peroxide, alcohol, or iodine.  If you have a wound, scrape, corn, or callus on your foot, look at it several times a day to make sure it is healing and not infected. Check for: ? Redness, swelling, or pain. ? Fluid or blood. ? Warmth. ? Pus or a bad smell. General instructions  Do not cross your legs. This may decrease blood flow to your feet.  Do not use heating pads or hot water bottles on your feet. They may burn your skin. If you have lost feeling in your feet or legs, you may not know this is happening until it is too late.  Protect your feet from hot and cold by wearing shoes, such as at the beach or on hot pavement.  Schedule a complete foot exam at least once a year (annually) or more often if you have foot problems. If you have foot problems, report any cuts, sores, or bruises to your health care provider immediately. Contact a health care provider if:  You have a medical condition that increases your risk of infection and you have any cuts, sores, or bruises on your feet.  You have an injury that is not   healing.  You have redness on your legs or feet.  You feel burning or tingling in your legs or feet.  You have pain or cramps in your legs and feet.  Your legs or feet are numb.  Your feet always feel cold.  You have pain around a toenail. Get help right away if:  You have a wound, scrape, corn, or callus on your foot and: ? You have pain, swelling, or redness that gets worse. ? You have fluid or blood coming from the wound, scrape, corn, or callus. ? Your wound, scrape, corn, or callus feels warm to the touch. ? You have pus or a bad smell coming from the wound, scrape, corn, or callus. ? You have a fever. ? You have a red line going up your leg. Summary  Check your feet every day  for cuts, sores, red spots, swelling, and blisters.  Moisturize feet and legs daily.  Wear shoes that fit properly and have enough cushioning.  If you have foot problems, report any cuts, sores, or bruises to your health care provider immediately.  Schedule a complete foot exam at least once a year (annually) or more often if you have foot problems. This information is not intended to replace advice given to you by your health care provider. Make sure you discuss any questions you have with your health care provider. Document Revised: 03/06/2019 Document Reviewed: 07/15/2016 Elsevier Patient Education  2020 Elsevier Inc.  

## 2019-08-13 NOTE — Progress Notes (Signed)
Subjective:     Patient ID: Yolanda Huffman, female   DOB: December 20, 1940, 79 y.o.   MRN: [id number]  HPI Yolanda Huffman is seen for follow-up regarding her type 2 diabetes.  She has history of combined systolic and diastolic heart failure, atrial fibrillation, hypertension, type 2 diabetes, stage IV chronic kidney disease which is followed by nephrology, history of breast cancer, and osteoporosis.  She is followed still by nephrology.  Generally doing well.  She is staying fairly isolated.  Fasting blood sugar this morning was 74.  She is overdue for eye exam.  No history of any neuropathy symptoms.  She is currently managing her blood sugars without medication.  Past Medical History:  Diagnosis Date  . Anemia   . Arthritis    gout  . Atrial fibrillation ([place])    prior Sotalol - d/c'd 2/2 increased creatinine; rate control strategy  . CAD (coronary artery disease)    s/p PCI in [place] in 1/09  . Cancer [facility])    left breast  . Chronic combined systolic and diastolic heart failure (HCC)    Echocardiogram 3/12: Mild LVH, EF 22-02%, normal diastolic function, mild AI, mild MR, PASP 44, normal wall motion  . CKD (chronic kidney disease)    sees Dr. Burman Foster - Stage 3 ?  Marland Kitchen Diabetes mellitus   . Hyperlipemia   . Hypertension   . MI, old    2009  . Osteoporosis   . Pacemaker   . Pleural effusion   . Pneumonia   . PUD (peptic ulcer disease) 10/2010   duodenal ulcer   Past Surgical History:  Procedure Laterality Date  . BREAST BIOPSY Left 07/29/2016  . BREAST LUMPECTOMY Left 09/19/2016  . BREAST LUMPECTOMY WITH RADIOACTIVE SEED LOCALIZATION Left 09/19/2016   Procedure: LEFT BREAST LUMPECTOMY WITH RADIOACTIVE SEED LOCALIZATION;  Surgeon: Autumn Messing III, MD;  Location: [place];  Service: General;  Laterality: Left;  . CATARACT EXTRACTION Bilateral    01/12/09 and 01/26/09 both eyes  . COLONOSCOPY    . CORONARY ANGIOPLASTY  2009  . PACEMAKER INSERTION  07/13/07  . PPM GENERATOR CHANGEOUT N/A 12/17/2018    Procedure: PPM GENERATOR CHANGEOUT;  Surgeon: Evans Lance, MD;  Location: [place] [facility];  Service: Cardiovascular;  Laterality: N/A;  . stent implant  07/13/2007  . TOTAL HIP ARTHROPLASTY Right 07/23/2017   Procedure: TOTAL HIP ARTHROPLASTY ANTERIOR APPROACH;  Surgeon: Mcarthur Rossetti, MD;  Location: [place];  Service: Orthopedics;  Laterality: Right;    reports that she quit smoking about 27 years ago. Her smoking use included cigarettes. She has a 40.00 pack-year smoking history. She has never used smokeless tobacco. She reports that she does not drink alcohol or use drugs. family history includes Breast cancer (age of onset: 69) in an other family member; Breast cancer (age of onset: 33) in her maternal aunt; Breast cancer (age of onset: 38) in her sister; Clotting disorder in her father; Heart attack in her father; Heart disease in her father and mother; Hypertension in her father. Allergies  Allergen Reactions  . Amlodipine Besylate Swelling   Wt Readings from Last 3 Encounters:  08/13/19 153 lb (69.4 kg)  08/12/19 151 lb (68.5 kg)  05/28/19 151 lb 4.8 oz (68.6 kg)     Review of Systems  Constitutional: Negative for appetite change and unexpected weight change.  Respiratory: Negative for cough and shortness of breath.   Cardiovascular: Negative for chest pain and leg swelling.  Gastrointestinal: Negative for  abdominal pain.  Endocrine: Negative for polydipsia and polyuria.  Genitourinary: Negative for dysuria.  Neurological: Negative for dizziness and facial asymmetry.  Hematological: Negative for adenopathy.       Objective:   Physical Exam Vitals reviewed.  Constitutional:      Appearance: Normal appearance.  Cardiovascular:     Pulses: Normal pulses.     Heart sounds: Normal heart sounds.  Pulmonary:     Effort: Pulmonary effort is normal.     Breath sounds: Normal breath sounds.  Musculoskeletal:     Cervical back: Neck supple.  Skin:    Comments:  She has several long toenails that are need of trimming.  No ulcerations.  Normal sensory function with monofilament testing.  No callus.  Neurological:     General: No focal deficit present.     Mental Status: She is alert and oriented to person, place, and time.        Assessment:     #1 type 2 diabetes well controlled with A1c 6.5%  #2 hypertension stable  #3 history of hyperlipidemia.  We reviewed lipids from this past fall which are stable on Lipitor    Plan:     -Recommend routine follow-up in 6 months. -She is encouraged to get her nails trimmed and she plans to do this on her own. - no further labs today.  Eulas Post MD [facility]

## 2019-09-06 ENCOUNTER — Telehealth (INDEPENDENT_AMBULATORY_CARE_PROVIDER_SITE_OTHER): Payer: Medicare PPO | Admitting: Cardiology

## 2019-09-06 ENCOUNTER — Other Ambulatory Visit: Payer: Self-pay | Admitting: Oncology

## 2019-09-06 ENCOUNTER — Other Ambulatory Visit: Payer: Self-pay | Admitting: Family Medicine

## 2019-09-06 VITALS — BP 132/80 | HR 75 | Ht 63.0 in | Wt 145.0 lb

## 2019-09-06 DIAGNOSIS — I071 Rheumatic tricuspid insufficiency: Secondary | ICD-10-CM | POA: Diagnosis not present

## 2019-09-06 DIAGNOSIS — I5042 Chronic combined systolic (congestive) and diastolic (congestive) heart failure: Secondary | ICD-10-CM

## 2019-09-06 DIAGNOSIS — I4821 Permanent atrial fibrillation: Secondary | ICD-10-CM

## 2019-09-06 DIAGNOSIS — Z7189 Other specified counseling: Secondary | ICD-10-CM | POA: Diagnosis not present

## 2019-09-06 DIAGNOSIS — I340 Nonrheumatic mitral (valve) insufficiency: Secondary | ICD-10-CM | POA: Diagnosis not present

## 2019-09-06 NOTE — Patient Instructions (Signed)

## 2019-09-06 NOTE — Progress Notes (Signed)
Virtual Visit via Telephone Note   This visit type was conducted due to national recommendations for restrictions regarding the COVID-19 Pandemic (e.g. social distancing) in an effort to limit this patient's exposure and mitigate transmission in our community.  Due to her co-morbid illnesses, this patient is at least at moderate risk for complications without adequate follow up.  This format is felt to be most appropriate for this patient at this time.  The patient did not have access to video technology/had technical difficulties with video requiring transitioning to audio format only (telephone).  All issues noted in this document were discussed and addressed.  No physical exam could be performed with this format.  Please refer to the patient's chart for her  consent to telehealth for [facility].   Date:  09/06/2019   ID:  Yolanda Huffman, DOB 1941-02-22, MRN [id number]  Patient Location: Home Provider Location: Home  PCP:  Eulas Post, MD  Cardiologist:  Buford Dresser, MD  Electrophysiologist:  Dr. Lovena Le   Evaluation Performed:  Follow-Up Visit  Chief Complaint:  Follow up  History of Present Illness:    Yolanda Huffman is a 78 y.o. 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 My initial visit with her was on 09/04/18.  The patient does not have symptoms concerning for COVID-19 infection (fever, chills, cough, or new shortness of breath).   Today: Has received both of her Covid vaccines, very thankful for this. Staying safe, continuing to mask when she goes out, etc. Had some symptoms after her vaccines but has recovered. She walks routinely around her apartment complex, keeps her distance from others while walking. Walks about 10 minutes at a time and working on building her time back  up.   Legs have no swelling, "like sticks." No chest pain or shortness of breath. No syncope. No PND or orthopnea.  No recent melena or hematochezia. We discussed anticoagulation again today given her prior GI bleed, wishes to defer.  Eating well. Reviewed that she does not need to avoid vitamin K rich foods as she is no longer on coumadin.   Past Medical History:  Diagnosis Date  . Anemia   . Arthritis    gout  . Atrial fibrillation ([place])    prior Sotalol - d/c'd 2/2 increased creatinine; rate control strategy  . CAD (coronary artery disease)    s/p PCI in [place] in 1/09  . Cancer [facility])    left breast  . Chronic combined systolic and diastolic heart failure (HCC)    Echocardiogram 3/12: Mild LVH, EF 77-93%, normal diastolic function, mild AI, mild MR, PASP 44, normal wall motion  . CKD (chronic kidney disease)    sees Dr. Burman Foster - Stage 3 ?  Marland Kitchen Diabetes mellitus   . Hyperlipemia   . Hypertension   . MI, old    2009  . Osteoporosis   . Pacemaker   . Pleural effusion   . Pneumonia   . PUD (peptic ulcer disease) 10/2010   duodenal ulcer   Past Surgical History:  Procedure Laterality Date  . BREAST BIOPSY Left 07/29/2016  . BREAST LUMPECTOMY Left 09/19/2016  . BREAST LUMPECTOMY WITH RADIOACTIVE SEED LOCALIZATION Left 09/19/2016   Procedure: LEFT BREAST LUMPECTOMY WITH RADIOACTIVE SEED LOCALIZATION;  Surgeon: Autumn Messing III, MD;  Location: [place];  Service: General;  Laterality:  Left;  . CATARACT EXTRACTION Bilateral    01/12/09 and 01/26/09 both eyes  . COLONOSCOPY    . CORONARY ANGIOPLASTY  2009  . PACEMAKER INSERTION  07/13/07  . PPM GENERATOR CHANGEOUT N/A 12/17/2018   Procedure: PPM GENERATOR CHANGEOUT;  Surgeon: Evans Lance, MD;  Location: [place] [facility];  Service: Cardiovascular;  Laterality: N/A;  . stent implant  07/13/2007  . TOTAL HIP ARTHROPLASTY Right 07/23/2017   Procedure: TOTAL HIP ARTHROPLASTY ANTERIOR APPROACH;  Surgeon: Mcarthur Rossetti, MD;   Location: [place];  Service: Orthopedics;  Laterality: Right;     Current Meds  Medication Sig  . [DISCONTINUED] anastrozole (ARIMIDEX) 1 MG tablet TAKE 1 TABLET BY MOUTH DAILY (Patient taking differently: Take 1 mg by mouth daily with lunch. )     Allergies:   Amlodipine besylate   Social History   Tobacco Use  . Smoking status: Former Smoker    Packs/day: 2.00    Years: 20.00    Pack years: 40.00    Types: Cigarettes    Quit date: 11/26/1991    Years since quitting: 27.7  . Smokeless tobacco: Never Used  Substance Use Topics  . Alcohol use: No  . Drug use: No     Family Hx: The patient's family history includes Breast cancer (age of onset: 68) in an other family member; Breast cancer (age of onset: 35) in her maternal aunt; Breast cancer (age of onset: 45) in her sister; Clotting disorder in her father; Heart attack in her father; Heart disease in her father and mother; Hypertension in her father. There is no history of Colon cancer.  ROS:   Please see the history of present illness. ROS otherwise unremarkable except as noted.   Prior CV studies:   The following studies were reviewed today: Echo 04/05/18 - Left ventricle: The cavity size was normal. Systolic function was mildly reduced. The estimated ejection fraction was in the range of 45% to 50%. Dysynchronous contraction of the ventricular septum with paradoxical motion likely secondary to pacing. There were no other regional wall motion abnormalities. The study is not technically sufficient to allow evaluation of LV diastolic function. - Aortic valve: A bicuspid morphology cannot be excluded (congenital bicuspid with right and left cusp fusion and disproportionately larger non coronary cusp vs. acquired functional bicuspid with calcific sclerotic fusion); severe leaflet sclerosis. Valve mobility was restricted. Transvalvular velocity was within the normal range. There was no stenosis. There  was at least moderate regurgitation, eccentric, directed toward the anterior mitral leaflet with evidence of mitral leaflet fluttering on m-mode. Mean gradient (S): 6 mm Hg. - Aorta: Dilated aortic root - Aortic root dimension: 41.7 mm. - Mitral valve: There was moderate to severe regurgitation. - Left atrium: The atrium was severely dilated. - Pulmonary veins: Possible systolic reversal in the right lower pulmonary vein that was interrogated, though Doppler signal is somewhat indeterminate. - Right ventricle: The cavity size was moderately dilated. Wall thickness was normal. Pacer wire or catheter noted in right ventricle. Systolic function was mildly reduced. - Right atrium: The atrium was moderately to severely dilated. - Tricuspid valve: There was severe regurgitation. - Pulmonary arteries: PA peak pressure: 52 mm Hg (S). - Inferior vena cava: The vessel was dilated. The respirophasic diameter changes were blunted (<50%), consistent with elevated central venous pressure. - Hepatic veins: Prominent systolic flow reversal noted. - Pericardium, extracardiac: There was no pericardial effusion.  Recommendations: Recommend transesophageal echocardiogram to better evaluate severity of multi-valvular heart  disease including aortic valve (congenital bicuspid R-L fusion vs. acquired functional bicuspid, with regurgitation), tricuspid valve in setting of pacemaker lead, and mitral valve (PISA quantitation may be underestimating severity due to mitral leaflet thickening).  Labs/Other Tests and Data Reviewed:    EKG:  An ECG dated 09/04/18 was personally reviewed today and demonstrated:  afib, v paced  Recent Labs: 05/28/2019: ALT 13; BUN 50; Creatinine, Ser 3.28; Hemoglobin 9.9; Platelets 197; Potassium 3.9; Sodium 137   Recent Lipid Panel Lab Results  Component Value Date/Time   CHOL 143 04/05/2019 10:40 AM   TRIG 136.0 04/05/2019 10:40 AM   HDL 59.70 04/05/2019  10:40 AM   CHOLHDL 2 04/05/2019 10:40 AM   LDLCALC 56 04/05/2019 10:40 AM   LDLDIRECT 89.2 06/03/2010 09:46 AM    Wt Readings from Last 3 Encounters:  09/06/19 145 lb (65.8 kg)  08/13/19 153 lb (69.4 kg)  08/12/19 151 lb (68.5 kg)     Objective:    Vital Signs:  BP 132/80   Pulse 75   Ht 5\' 3"  (1.6 m)   Wt 145 lb (65.8 kg)   BMI 25.69 kg/m    Speaking comfortably on the phone, no audible wheezing In no acute distress Alert and oriented Normal affect Normal speech  ASSESSMENT & PLAN:    Mitral regurgitation, moderate to severe, with severe TR:  -she denies any heart failure symptoms -does not want TEE unless symptoms change -continue current diuretic regimen of furosemide and metolazone, has potassium supplementation -echo would not change management at this time  Chronic systolic and diastolic heart failure: asymptomatic, NYHA class I currently -doing well overall -given CKD stage, not a candidate for ACEi, ARB, ARNI, or MRA -already on metoprolol succinate -reviewed salt avoidance, daily weights  Permanent atrial fibrillation -CHA2DS2/VAS Stroke Risk Points  =  7 -severe GI bleed with hospitalization. Capsule study showed AVMs but not active bleeding -we have discussed risk and benefit of anticoagulation on multiple occasions. She does not wish to restart anticoagulation, understands risk of stroke but is more concerned about GI bleed.  Sinus node dysfunction s/p PPM: -followed by Dr. Lovena Le  Type II diabetes, on insulin -per Dr Elease Hashimoto  Hyperlipidemia: -continue atorvastatin 40 mg daily  Cardiac risk counseling and prevention recommendations: -recommend heart healthy/Mediterranean diet, with whole grains, fruits, vegetable, fish, lean meats, nuts, and olive oil. Limit salt. -recommend moderate walking, 3-5 times/week for 30-50 minutes each session. Aim for at least 150 minutes.week. Goal should be pace of 3 miles/hours, or walking 1.5 miles in 30  minutes -recommend avoidance of tobacco products. Avoid excess alcohol.  COVID-19 Education: The signs and symptoms of COVID-19 were discussed with the patient and how to seek care for testing (follow up with PCP or arrange E-visit).  The importance of social distancing was discussed today.  Time:   Today, I have spent 20 minutes with the patient with telehealth technology discussing the above problems.    Patient Instructions  Medication Instructions:  Your Physician recommend you continue on your current medication as directed.    *If you need a refill on your cardiac medications before your next appointment, please call your pharmacy*   Lab Work: None   Testing/Procedures: None   Follow-Up: At [facility], you and your health needs are our priority.  As part of our continuing mission to provide you with exceptional heart care, we have created designated Provider Care Teams.  These Care Teams include your primary Cardiologist (physician) and Advanced Practice Providers (  APPs -  Physician Assistants and Nurse Practitioners) who all work together to provide you with the care you need, when you need it.  We recommend signing up for the patient portal called "MyChart".  Sign up information is provided on this After Visit Summary.  MyChart is used to connect with patients for Virtual Visits (Telemedicine).  Patients are able to view lab/test results, encounter notes, upcoming appointments, etc.  Non-urgent messages can be sent to your provider as well.   To learn more about what you can do with MyChart, go to [email or web address].    Your next appointment:   6 month(s)  The format for your next appointment:   In Person  Provider:   Buford Dresser, MD       Follow Up:  6 mos  Signed, Buford Dresser, MD  09/06/2019 9:49 AM    [place]

## 2019-09-13 DIAGNOSIS — N184 Chronic kidney disease, stage 4 (severe): Secondary | ICD-10-CM | POA: Diagnosis not present

## 2019-09-16 ENCOUNTER — Telehealth: Payer: Self-pay | Admitting: Family Medicine

## 2019-09-16 NOTE — Telephone Encounter (Signed)
Pt has changed insurance and received a letter regarding Metoprolol succ ER 25 mg tablets. Per pt, the physician needs to call the insurance and explain why pt needs to take this medication and the quantity of the medication is the correct dosage. Pt is asking, for a call back when this is taking care of. Pt does not need a refill.  Pt's [employer] ID #[id number] Thanks  [employer] ph [place] fax # [phone]

## 2019-09-17 ENCOUNTER — Ambulatory Visit (INDEPENDENT_AMBULATORY_CARE_PROVIDER_SITE_OTHER): Payer: Medicare PPO | Admitting: *Deleted

## 2019-09-17 DIAGNOSIS — I495 Sick sinus syndrome: Secondary | ICD-10-CM

## 2019-09-17 LAB — CUP PACEART REMOTE DEVICE CHECK
Battery Remaining Longevity: 100 mo
Battery Voltage: 3.03 V
Brady Statistic AP VP Percent: 0 %
Brady Statistic AP VS Percent: 0 %
Brady Statistic AS VP Percent: 99.47 %
Brady Statistic AS VS Percent: 0.53 %
Brady Statistic RA Percent Paced: 0 %
Brady Statistic RV Percent Paced: 99.47 %
Date Time Interrogation Session: 20210322222848
Implantable Lead Implant Date: 20090116
Implantable Lead Implant Date: 20090116
Implantable Lead Location: 753859
Implantable Lead Location: 753860
Implantable Lead Model: 5076
Implantable Lead Model: 5076
Implantable Pulse Generator Implant Date: 20200622
Lead Channel Impedance Value: 285 Ohm
Lead Channel Impedance Value: 285 Ohm
Lead Channel Impedance Value: 361 Ohm
Lead Channel Impedance Value: 361 Ohm
Lead Channel Pacing Threshold Amplitude: 1.625 V
Lead Channel Pacing Threshold Pulse Width: 0.4 ms
Lead Channel Sensing Intrinsic Amplitude: 0.75 mV
Lead Channel Sensing Intrinsic Amplitude: 2.625 mV
Lead Channel Sensing Intrinsic Amplitude: 2.625 mV
Lead Channel Setting Pacing Amplitude: 3.25 V
Lead Channel Setting Pacing Pulse Width: 0.4 ms
Lead Channel Setting Sensing Sensitivity: 1.2 mV

## 2019-09-17 NOTE — Telephone Encounter (Signed)
PA has been started on cover my meds  

## 2019-09-18 NOTE — Telephone Encounter (Signed)
Pt has been notified that PA has been approved

## 2019-09-18 NOTE — Progress Notes (Signed)
PPM Remote  

## 2019-10-01 DIAGNOSIS — E1039 Type 1 diabetes mellitus with other diabetic ophthalmic complication: Secondary | ICD-10-CM | POA: Diagnosis not present

## 2019-10-01 LAB — HM DIABETES EYE EXAM

## 2019-10-07 ENCOUNTER — Other Ambulatory Visit: Payer: Self-pay | Admitting: Cardiology

## 2019-10-14 ENCOUNTER — Encounter: Payer: Self-pay | Admitting: Family Medicine

## 2019-10-25 ENCOUNTER — Encounter: Payer: Self-pay | Admitting: Cardiology

## 2019-11-11 ENCOUNTER — Other Ambulatory Visit: Payer: Self-pay | Admitting: Family Medicine

## 2019-11-14 ENCOUNTER — Other Ambulatory Visit: Payer: Self-pay | Admitting: Oncology

## 2019-11-14 DIAGNOSIS — Z9889 Other specified postprocedural states: Secondary | ICD-10-CM

## 2019-11-27 ENCOUNTER — Telehealth: Payer: Self-pay | Admitting: Oncology

## 2019-11-27 NOTE — Telephone Encounter (Signed)
Pt unable to schedule appt at the moment. Pt is going to call back to schedule appt for Lab/MD/inj. Per 6/1 sch message.

## 2019-12-02 ENCOUNTER — Telehealth: Payer: Self-pay

## 2019-12-02 ENCOUNTER — Telehealth: Payer: Self-pay | Admitting: Oncology

## 2019-12-02 NOTE — Telephone Encounter (Signed)
Scheduled appt per 6/7 sch message - pt is aware of appt date and time   

## 2019-12-02 NOTE — Telephone Encounter (Signed)
Pt called and states someone told her that if she could arrange for transportation, she would be able to have appt on 01/08/20 for 1100 lab, 1200 F/U visit with Magrinat and injection appt for Prolia. Pt states she was able to get transportation arranged by daughter. Message was sent to scheduling to make this appt and call pt to verify.

## 2019-12-17 ENCOUNTER — Ambulatory Visit (INDEPENDENT_AMBULATORY_CARE_PROVIDER_SITE_OTHER): Payer: Medicare PPO | Admitting: *Deleted

## 2019-12-17 DIAGNOSIS — I495 Sick sinus syndrome: Secondary | ICD-10-CM

## 2019-12-17 LAB — CUP PACEART REMOTE DEVICE CHECK
Battery Remaining Longevity: 94 mo
Battery Voltage: 3.02 V
Brady Statistic AP VP Percent: 0 %
Brady Statistic AP VS Percent: 0 %
Brady Statistic AS VP Percent: 99.78 %
Brady Statistic AS VS Percent: 0.22 %
Brady Statistic RA Percent Paced: 0 %
Brady Statistic RV Percent Paced: 99.78 %
Date Time Interrogation Session: 20210621202803
Implantable Lead Implant Date: 20090116
Implantable Lead Implant Date: 20090116
Implantable Lead Location: 753859
Implantable Lead Location: 753860
Implantable Lead Model: 5076
Implantable Lead Model: 5076
Implantable Pulse Generator Implant Date: 20200622
Lead Channel Impedance Value: 285 Ohm
Lead Channel Impedance Value: 342 Ohm
Lead Channel Impedance Value: 361 Ohm
Lead Channel Impedance Value: 418 Ohm
Lead Channel Pacing Threshold Amplitude: 1.75 V
Lead Channel Pacing Threshold Pulse Width: 0.4 ms
Lead Channel Sensing Intrinsic Amplitude: 0.75 mV
Lead Channel Sensing Intrinsic Amplitude: 2.875 mV
Lead Channel Sensing Intrinsic Amplitude: 2.875 mV
Lead Channel Setting Pacing Amplitude: 3.5 V
Lead Channel Setting Pacing Pulse Width: 0.4 ms
Lead Channel Setting Sensing Sensitivity: 1.2 mV

## 2019-12-18 ENCOUNTER — Telehealth: Payer: Self-pay | Admitting: Cardiology

## 2019-12-18 NOTE — Progress Notes (Signed)
Remote pacemaker transmission.   

## 2019-12-18 NOTE — Telephone Encounter (Signed)
No message needed °

## 2019-12-18 NOTE — Telephone Encounter (Signed)
New message   Patient's daughter has question about an after summary visit that she received after home remote visit. Please call to discuss.

## 2019-12-18 NOTE — Telephone Encounter (Signed)
Explained to pt daughter that Sinus Node dysfunction is the reason that pt received PPM and therefore if the reason we submit billing to insurance for reports.

## 2019-12-19 ENCOUNTER — Other Ambulatory Visit: Payer: Self-pay

## 2019-12-19 ENCOUNTER — Ambulatory Visit
Admission: RE | Admit: 2019-12-19 | Discharge: 2019-12-19 | Disposition: A | Discharge status: Home / Self Care | Payer: Medicare PPO | Source: Ambulatory Visit | Attending: Oncology | Admitting: Oncology

## 2019-12-19 DIAGNOSIS — Z9889 Other specified postprocedural states: Secondary | ICD-10-CM

## 2019-12-19 DIAGNOSIS — R928 Other abnormal and inconclusive findings on diagnostic imaging of breast: Secondary | ICD-10-CM | POA: Diagnosis not present

## 2019-12-19 DIAGNOSIS — N184 Chronic kidney disease, stage 4 (severe): Secondary | ICD-10-CM | POA: Diagnosis not present

## 2019-12-19 DIAGNOSIS — Z853 Personal history of malignant neoplasm of breast: Secondary | ICD-10-CM | POA: Diagnosis not present

## 2019-12-20 ENCOUNTER — Encounter: Payer: Self-pay | Admitting: Family Medicine

## 2019-12-27 ENCOUNTER — Encounter: Payer: Self-pay | Admitting: Internal Medicine

## 2019-12-27 ENCOUNTER — Ambulatory Visit: Payer: Medicare PPO | Admitting: Internal Medicine

## 2019-12-27 ENCOUNTER — Other Ambulatory Visit: Payer: Self-pay

## 2019-12-27 VITALS — BP 162/72 | HR 90 | Ht 63.5 in | Wt 149.0 lb

## 2019-12-27 DIAGNOSIS — I495 Sick sinus syndrome: Secondary | ICD-10-CM | POA: Diagnosis not present

## 2019-12-27 DIAGNOSIS — I5042 Chronic combined systolic (congestive) and diastolic (congestive) heart failure: Secondary | ICD-10-CM

## 2019-12-27 DIAGNOSIS — I4821 Permanent atrial fibrillation: Secondary | ICD-10-CM | POA: Diagnosis not present

## 2019-12-27 DIAGNOSIS — N184 Chronic kidney disease, stage 4 (severe): Secondary | ICD-10-CM | POA: Diagnosis not present

## 2019-12-27 NOTE — Patient Instructions (Signed)
Medication Instructions:  Your physician recommends that you continue on your current medications as directed. Please refer to the Current Medication list given to you today.  *If you need a refill on your cardiac medications before your next appointment, please call your pharmacy*  Lab Work: None ordered.  If you have labs (blood work) drawn today and your tests are completely normal, you will receive your results only by: Marland Kitchen MyChart Message (if you have MyChart) OR . A paper copy in the mail If you have any lab test that is abnormal or we need to change your treatment, we will call you to review the results.  Testing/Procedures: None ordered.  Follow-Up: At [facility], you and your health needs are our priority.  As part of our continuing mission to provide you with exceptional heart care, we have created designated Provider Care Teams.  These Care Teams include your primary Cardiologist (physician) and Advanced Practice Providers (APPs -  Physician Assistants and Nurse Practitioners) who all work together to provide you with the care you need, when you need it.  We recommend signing up for the patient portal called "MyChart".  Sign up information is provided on this After Visit Summary.  MyChart is used to connect with patients for Virtual Visits (Telemedicine).  Patients are able to view lab/test results, encounter notes, upcoming appointments, etc.  Non-urgent messages can be sent to your provider as well.   To learn more about what you can do with MyChart, go to [email or web address].    Your next appointment:   Your physician wants you to follow-up in: 1 year with Dr. Lovena Le.  You will receive a reminder letter in the mail two months in advance. If you don't receive a letter, please call our office to schedule the follow-up appointment.  Remote monitoring is used to monitor your Pacemaker from home. This monitoring reduces the number of office visits required to check your device  to one time per year. It allows [place] to keep an eye on the functioning of your device to ensure it is working properly. You are scheduled for a device check from home on 03/17/2020. You may send your transmission at any time that day. If you have a wireless device, the transmission will be sent automatically. After your physician reviews your transmission, you will receive a postcard with your next transmission date.  Other Instructions:

## 2019-12-27 NOTE — Progress Notes (Signed)
HPI Ms. Frese returns today for followup. She is a pleasant elderly woman with a h/o CHB, s/p PPM insertion, dyslipidemia, and HTN. She has done well in the interim. She took her bp earlier today and this was 125/78. She has not had chest pain or sob. No edema. No syncope.  Allergies  Allergen Reactions   Amlodipine Besylate Swelling     Current Outpatient Medications  Medication Sig Dispense Refill   acetaminophen (TYLENOL) 500 MG tablet Take 500-1,000 mg by mouth daily as needed for moderate pain.     anastrozole (ARIMIDEX) 1 MG tablet TAKE ONE TABLET BY MOUTH DAILY 90 tablet 1   atorvastatin (LIPITOR) 40 MG tablet TAKE ONE TABLET BY MOUTH DAILY 90 tablet 3   calcium carbonate (TUMS - DOSED IN MG ELEMENTAL CALCIUM) 500 MG chewable tablet Chew 1 tablet by mouth See admin instructions. Tuesday, Thursday, Saturday and Sunday     Cranberry 500 MG CAPS Take 500 mg by mouth daily with lunch.     denosumab (PROLIA) 60 MG/ML SOSY injection Inject 60 mg into the skin every 6 (six) months.     furosemide (LASIX) 40 MG tablet TAKE TWO TABLETS BY MOUTH EVERY MORNING AND TAKE ONE TABLET BY MOUTH EVERY EVENING 270 tablet 1   glucose blood test strip 1 each by Other route 3 (three) times daily. Use as instructed      Insulin Pen Needle (BD PEN NEEDLE NANO U/F) 32G X 4 MM MISC Use to inject Levemir one time daily. Dx Code E11.9 100 each 1   KLOR-CON M20 20 MEQ tablet TAKE TWO TABLETS BY MOUTH TWICE A DAY WITH FOOD AND WATER 360 tablet 3   LEVEMIR FLEXTOUCH 100 UNIT/ML FlexPen INJECT 12 UNITS UNDER THE SKIN DAILY 15 mL 0   metolazone (ZAROXOLYN) 2.5 MG tablet TAKE ONE TABLET BY MOUTH EVERY MONDAY, WEDNESDAY, AND FRIDAY 39 tablet 0   metoprolol succinate (TOPROL-XL) 25 MG 24 hr tablet TAKE 2 TABLETS BY MOUTH EVERY MORNING AND 1 TABLET EVERY EVENING 270 tablet 1   Multiple Vitamin (MULTIVITAMIN WITH MINERALS) TABS tablet Take 1 tablet by mouth daily. FOR ADULT 50+     Omega-3 Fatty  Acids (FISH OIL) 1000 MG CAPS Take 1,000 mg by mouth daily.      pantoprazole (PROTONIX) 40 MG tablet TAKE ONE TABLET BY MOUTH DAILY 90 tablet 3   No current facility-administered medications for this visit.     Past Medical History:  Diagnosis Date   Anemia    Arthritis    gout   Atrial fibrillation (HCC)    prior Sotalol - d/c'd 2/2 increased creatinine; rate control strategy   CAD (coronary artery disease)    s/p PCI in [place] in 1/09   Cancer [facility])    left breast   Chronic combined systolic and diastolic heart failure (HCC)    Echocardiogram 3/12: Mild LVH, EF 62-94%, normal diastolic function, mild AI, mild MR, PASP 44, normal wall motion   CKD (chronic kidney disease)    sees Dr. Burman Foster - Stage 3 ?   Diabetes mellitus    Hyperlipemia    Hypertension    MI, old    2009   Osteoporosis    Pacemaker    Pleural effusion    Pneumonia    PUD (peptic ulcer disease) 10/2010   duodenal ulcer    ROS:   All systems reviewed and negative except as noted in the HPI.   Past Surgical History:  Procedure Laterality Date   BREAST BIOPSY Left 07/29/2016   BREAST LUMPECTOMY Left 09/19/2016   BREAST LUMPECTOMY WITH RADIOACTIVE SEED LOCALIZATION Left 09/19/2016   Procedure: LEFT BREAST LUMPECTOMY WITH RADIOACTIVE SEED LOCALIZATION;  Surgeon: Autumn Messing III, MD;  Location: [facility];  Service: General;  Laterality: Left;   CATARACT EXTRACTION Bilateral    01/12/09 and 01/26/09 both eyes   COLONOSCOPY     CORONARY ANGIOPLASTY  2009   PACEMAKER INSERTION  07/13/07   PPM GENERATOR CHANGEOUT N/A 12/17/2018   Procedure: PPM GENERATOR CHANGEOUT;  Surgeon: Evans Lance, MD;  Location: [facility];  Service: Cardiovascular;  Laterality: N/A;   stent implant  07/13/2007   TOTAL HIP ARTHROPLASTY Right 07/23/2017   Procedure: TOTAL HIP ARTHROPLASTY ANTERIOR APPROACH;  Surgeon: Mcarthur Rossetti, MD;  Location: [facility];  Service: Orthopedics;  Laterality:  Right;     Family History  Problem Relation Age of Onset   Heart disease Father    Heart attack Father    Clotting disorder Father        blood clot   Hypertension Father    Heart disease Mother    Breast cancer Maternal Aunt 78   Breast cancer Sister 65   Breast cancer Other 43   Colon cancer Neg Hx      Social History   Socioeconomic History   Marital status: Widowed    Spouse name: Not on file   Number of children: 1   Years of education: Not on file   Highest education level: Not on file  Occupational History   Occupation: retired Theatre stage manager: RETIRED  Tobacco Use   Smoking status: Former Smoker    Packs/day: 2.00    Years: 20.00    Pack years: 40.00    Types: Cigarettes    Quit date: 11/26/1991    Years since quitting: 28.1   Smokeless tobacco: Never Used  Vaping Use   Vaping Use: Never used  Substance and Sexual Activity   Alcohol use: No   Drug use: No   Sexual activity: Not on file  Other Topics Concern   Not on file  Social History Narrative   Widowed   [place] 1 resides in senior apartment    1 daughter local; no grandchildren      Social Determinants of Radio broadcast assistant Strain: Low Risk    Difficulty of Paying Living Expenses: Not hard at all  Food Insecurity: No Food Insecurity   Worried About Charity fundraiser in the Last Year: Never true   Arboriculturist in the Last Year: Never true  Transportation Needs: No Transportation Needs   Lack of Transportation (Medical): No   Lack of Transportation (Non-Medical): No  Physical Activity: Insufficiently Active   Days of Exercise per Week: 4 days   Minutes of Exercise per Session: 20 min  Stress:    Feeling of Stress :   Social Connections: Moderately Isolated   Frequency of Communication with Friends and Family: More than three times a week   Frequency of Social Gatherings with Friends and Family: Once a week   Attends Religious Services: Never    Marine scientist or Organizations: Yes   Attends Archivist Meetings: Never   Marital Status: Widowed  Intimate Partner Violence:    Fear of Current or Ex-Partner:    Emotionally Abused:    Physically Abused:    Sexually Abused:  BP (!) 162/72    Pulse 90    Ht 5' 3.5" (1.613 m)    Wt 149 lb (67.6 kg)    SpO2 97%    BMI 25.98 kg/m   Physical Exam:  Well appearing NAD HEENT: Unremarkable Neck:  No JVD, no thyromegally Lymphatics:  No adenopathy Back:  No CVA tenderness Lungs:  Clear with no wheezes HEART:  Regular rate rhythm, no murmurs, no rubs, no clicks Abd:  soft, positive bowel sounds, no organomegally, no rebound, no guarding Ext:  2 plus pulses, no edema, no cyanosis, no clubbing Skin:  No rashes no nodules Neuro:  CN II through XII intact, motor grossly intact  EKG - atrial fib with ventricular pacing  DEVICE  Normal device function.  See PaceArt for details.   Assess/Plan: 1. CHB - she is asymptomatic, s/p PPM insertion. 2. 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  3. HTN - her bp today is high but at home it is much improved. 4. Perm atrial fib - her rates are well controlled. She has a h/o GI bleeding. We will not prescribe systemic anti-coagulation.   [email or web address] [email or web address].

## 2020-01-04 ENCOUNTER — Other Ambulatory Visit: Payer: Self-pay | Admitting: Cardiology

## 2020-01-07 NOTE — Progress Notes (Signed)
[place]  Telephone:([phone]) [phone] Fax:(336) [phone]     ID: Yolanda Huffman DOB: December 07, 1940  MR#: [id number]  IDP#:[id number]  Patient Care Team: Eulas Post, MD as PCP - General (Family Medicine) Buford Dresser, MD as PCP - Cardiology (Cardiology) Donato Heinz, MD as Consulting Physician (Nephrology) Dastan Krider, Virgie Dad, MD as Consulting Physician (Oncology) Jovita Kussmaul, MD as Consulting Physician (General Surgery) Causey, Charlestine Massed, NP as Nurse Practitioner (Hematology and Oncology) Mcarthur Rossetti, MD as Consulting Physician (Orthopedic Surgery) Mauri Pole, MD as Consulting Physician (Gastroenterology) Evans Lance, MD as Consulting Physician (Cardiology) Chauncey Cruel, MD OTHER MD:   CHIEF COMPLAINT: Ductal carcinoma in situ   CURRENT TREATMENT: Anastrozole, denosumab/Prolia   INTERVAL HISTORY: Yolanda Huffman returns today for follow-up and treatment of her ductal carcinoma in situ. She was last seen here on 05/28/2019.   She continues on anastrozole.  She tolerates this with no side effects that she is aware of  She also continues on Prolia, with her most recent dose 05/28/2019.  She is scheduled for treatment today.  Yolanda Huffman's last bone density screening on 08/16/2018, showed a T-score of -2.9, which is considered osteopenic.   Since her last visit here, she underwent a digital diagnostic bilateral mammogram with tomography on 12/19/2019 revealing no evidence of breast malignancy.    REVIEW OF SYSTEMS: Yolanda Huffman had both doses of the modern her vaccines.  She had mild side effects from the second dose but "no big deal".  She does her housework and cooking and walks around the complex for exercise.  She has noted no change in either breast.  A detailed review of systems today was stable   BREAST CANCER HISTORY: From the original intake note:  The patient had routine bilateral screening mammography with  tomography at the [facility] 07/06/2016, showing new calcifications in the left breast. The right breast was unremarkable. Unilateral left diagnostic mammography 07/27/2016 found the breast density to be category B. In the upper inner quadrant of the left breast there was a 0.7 cm group of pleomorphic calcifications.  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  Her subsequent history is as detailed below   PAST MEDICAL HISTORY: Past Medical History:  Diagnosis Date  . Anemia   . Arthritis    gout  . Atrial fibrillation ([place])    prior Sotalol - d/c'd 2/2 increased creatinine; rate control strategy  . CAD (coronary artery disease)    s/p PCI in [place] in 1/09  . Cancer [facility])    left breast  . Chronic combined systolic and diastolic heart failure (HCC)    Echocardiogram 3/12: Mild LVH, EF 44-31%, normal diastolic function, mild AI, mild MR, PASP 44, normal wall motion  . CKD (chronic kidney disease)    sees Dr. Burman Foster - Stage 3 ?  Marland Kitchen Diabetes mellitus   . Hyperlipemia   . Hypertension   . MI, old    2009  . Osteoporosis   . Pacemaker   . Pleural effusion   . Pneumonia   . PUD (peptic ulcer disease) 10/2010   duodenal ulcer    PAST SURGICAL HISTORY: Past Surgical History:  Procedure Laterality Date  . BREAST BIOPSY Left 07/29/2016  . BREAST LUMPECTOMY Left 09/19/2016  . BREAST LUMPECTOMY WITH RADIOACTIVE SEED LOCALIZATION Left 09/19/2016   Procedure: LEFT BREAST LUMPECTOMY WITH RADIOACTIVE SEED  LOCALIZATION;  Surgeon: Autumn Messing III, MD;  Location: [place];  Service: General;  Laterality: Left;  . CATARACT EXTRACTION Bilateral    01/12/09 and 01/26/09 both eyes  . COLONOSCOPY    . CORONARY ANGIOPLASTY  2009  . PACEMAKER INSERTION  07/13/07  . PPM GENERATOR CHANGEOUT N/A 12/17/2018   Procedure: PPM GENERATOR CHANGEOUT;   Surgeon: Evans Lance, MD;  Location: [place] [facility];  Service: Cardiovascular;  Laterality: N/A;  . stent implant  07/13/2007  . TOTAL HIP ARTHROPLASTY Right 07/23/2017   Procedure: TOTAL HIP ARTHROPLASTY ANTERIOR APPROACH;  Surgeon: Mcarthur Rossetti, MD;  Location: [place];  Service: Orthopedics;  Laterality: Right;    FAMILY HISTORY Family History  Problem Relation Age of Onset  . Heart disease Father   . Heart attack Father   . Clotting disorder Father        blood clot  . Hypertension Father   . Heart disease Mother   . Breast cancer Maternal Aunt 73  . Breast cancer Sister 38  . Breast cancer Other 40  . Colon cancer Neg Hx    The patient's father died at age 29 from a myocardial infarction. The patient's mother died from multiple problems including pleurisy at the age of 80. The patient has 3 brothers, 2 sisters. One sister was diagnosed with breast cancer in her late 39s. The other sister had a "vaginal cancer" (? Cervical cancer). A maternal aunt was diagnosed with breast cancer in her 3s and her daughter, the patient's niece was diagnosed with breast cancer in her 67s.   GYNECOLOGIC HISTORY:  No LMP recorded. Patient is postmenopausal. Menarche age 73, first live birth age 33, the patient is GX P1. She stopped having periods in 1992. She did not take hormone replacement. She took oral contraceptives briefly remotely with no complications.    SOCIAL HISTORY: (Updated 07/18/2018) She is originally from [place]. 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 She is a widow. She lives with her cat (adopted at 12 year old on 08/06/2014), Chrissy, in a retirement community. Her daughter Kristie Cowman lives in [place]. She is a Runner, broadcasting/film/video. The patient has no grandchildren. She is not a church attender    ADVANCED DIRECTIVES: The patient's daughter Alleen Borne is her healthcare power of attorney. Alleen Borne can be reached at [phone] or [phone]   HEALTH  MAINTENANCE: Social History   Tobacco Use  . Smoking status: Former Smoker    Packs/day: 2.00    Years: 20.00    Pack years: 40.00    Types: Cigarettes    Quit date: 11/26/1991    Years since quitting: 28.1  . Smokeless tobacco: Never Used  Vaping Use  . Vaping Use: Never used  Substance Use Topics  . Alcohol use: No  . Drug use: No     Colonoscopy:January 2017/ [facility]  PAP:  Bone density: March 2017 showed a T score of -2.7 (osteoporosis) right femoral neck   Allergies  Allergen Reactions  . Amlodipine Besylate Swelling    Current Outpatient Medications  Medication Sig Dispense Refill  . acetaminophen (TYLENOL) 500 MG tablet Take 500-1,000 mg by mouth daily as needed for moderate pain.    Marland Kitchen anastrozole (ARIMIDEX) 1 MG tablet TAKE ONE TABLET BY MOUTH DAILY 90 tablet 1  . atorvastatin (LIPITOR) 40 MG tablet TAKE ONE TABLET BY MOUTH DAILY 90 tablet 3  . calcium carbonate (TUMS - DOSED IN MG ELEMENTAL CALCIUM) 500 MG chewable tablet Chew  1 tablet by mouth See admin instructions. Tuesday, Thursday, Saturday and Sunday    . Cranberry 500 MG CAPS Take 500 mg by mouth daily with lunch.    . denosumab (PROLIA) 60 MG/ML SOSY injection Inject 60 mg into the skin every 6 (six) months.    . furosemide (LASIX) 40 MG tablet TAKE TWO TABLETS BY MOUTH EVERY MORNING AND TAKE ONE TABLET BY MOUTH EVERY EVENING 270 tablet 1  . glucose blood test strip 1 each by Other route 3 (three) times daily. Use as instructed     . Insulin Pen Needle (BD PEN NEEDLE NANO U/F) 32G X 4 MM MISC Use to inject Levemir one time daily. Dx Code E11.9 100 each 1  . KLOR-CON M20 20 MEQ tablet TAKE TWO TABLETS BY MOUTH TWICE A DAY WITH FOOD AND WATER 360 tablet 3  . LEVEMIR FLEXTOUCH 100 UNIT/ML FlexPen INJECT 12 UNITS UNDER THE SKIN DAILY 15 mL 0  . metolazone (ZAROXOLYN) 2.5 MG tablet TAKE ONE TABLET BY MOUTH DAILY ON MONDAY, WEDNESDAY, AND FRIDAY 39 tablet 0  . metoprolol succinate (TOPROL-XL) 25 MG 24 hr tablet TAKE  2 TABLETS BY MOUTH EVERY MORNING AND 1 TABLET EVERY EVENING 270 tablet 1  . Multiple Vitamin (MULTIVITAMIN WITH MINERALS) TABS tablet Take 1 tablet by mouth daily. FOR ADULT 50+    . Omega-3 Fatty Acids (FISH OIL) 1000 MG CAPS Take 1,000 mg by mouth daily.     . pantoprazole (PROTONIX) 40 MG tablet TAKE ONE TABLET BY MOUTH DAILY 90 tablet 3   No current facility-administered medications for this visit.    OBJECTIVE: White woman who appears stated age  10:   01/08/20 1123  BP: 124/61  Pulse: 95  Resp: 17  Temp: 98.2 F (36.8 C)  SpO2: 100%   Wt Readings from Last 3 Encounters:  01/08/20 150 lb 9.6 oz (68.3 kg)  12/27/19 149 lb (67.6 kg)  09/06/19 145 lb (65.8 kg)   Body mass index is 26.26 kg/m.    ECOG FS:1 - Symptomatic but completely ambulatory  Ocular: Sclerae unicteric Ear-nose-throat: Wearing a mask Lymphatic: No cervical or supraclavicular adenopathy Lungs no rales or rhonchi Heart regular rate and rhythm Abd soft, nontender, positive bowel sounds MSK mild kyphosis but no focal spinal tenderness, no joint edema Neuro: non-focal, well-oriented, appropriate affect Breasts: the right breast is unremarkable.  The left breast is status post lumpectomy.  There is no evidence of local recurrence.  Both axillae are benign.    LAB RESULTS:  CMP     Component Value Date/Time   NA 136 01/08/2020 1102   NA 137 05/28/2019 0000   NA 134 (L) 04/18/2017 0950   K 3.6 01/08/2020 1102   K 3.4 (L) 04/18/2017 0950   CL 100 01/08/2020 1102   CO2 23 01/08/2020 1102   CO2 26 04/18/2017 0950   GLUCOSE 177 (H) 01/08/2020 1102   GLUCOSE 139 04/18/2017 0950   BUN 52 (H) 01/08/2020 1102   BUN 50 (A) 05/28/2019 0000   BUN 53.9 (H) 04/18/2017 0950   CREATININE 3.02 (HH) 01/08/2020 1102   CREATININE 3.04 (HH) 11/22/2018 1022   CREATININE 2.4 (H) 04/18/2017 0950   CALCIUM 9.7 01/08/2020 1102   CALCIUM 9.1 04/18/2017 0950   PROT 7.3 01/08/2020 1102   PROT 7.4 04/18/2017 0950    ALBUMIN 3.4 (L) 01/08/2020 1102   ALBUMIN 3.4 (L) 04/18/2017 0950   AST 19 01/08/2020 1102   AST 20 11/22/2018 1022   AST  28 04/18/2017 0950   ALT 11 01/08/2020 1102   ALT 11 11/22/2018 1022   ALT 18 04/18/2017 0950   ALKPHOS 39 01/08/2020 1102   ALKPHOS 42 04/18/2017 0950   BILITOT 0.5 01/08/2020 1102   BILITOT 0.4 11/22/2018 1022   BILITOT 0.55 04/18/2017 0950   GFRNONAA 14 (L) 01/08/2020 1102   GFRNONAA 14 (L) 11/22/2018 1022   GFRAA 16 (L) 01/08/2020 1102   GFRAA 16 (L) 11/22/2018 1022    INo results found for: SPEP, UPEP  Lab Results  Component Value Date   WBC 6.9 01/08/2020   NEUTROABS 4.8 01/08/2020   HGB 11.4 (L) 01/08/2020   HCT 34.7 (L) 01/08/2020   MCV 91.8 01/08/2020   PLT 179 01/08/2020      Chemistry      Component Value Date/Time   NA 136 01/08/2020 1102   NA 137 05/28/2019 0000   NA 134 (L) 04/18/2017 0950   K 3.6 01/08/2020 1102   K 3.4 (L) 04/18/2017 0950   CL 100 01/08/2020 1102   CO2 23 01/08/2020 1102   CO2 26 04/18/2017 0950   BUN 52 (H) 01/08/2020 1102   BUN 50 (A) 05/28/2019 0000   BUN 53.9 (H) 04/18/2017 0950   CREATININE 3.02 (HH) 01/08/2020 1102   CREATININE 3.04 (HH) 11/22/2018 1022   CREATININE 2.4 (H) 04/18/2017 0950   GLU 124 05/28/2019 0000      Component Value Date/Time   CALCIUM 9.7 01/08/2020 1102   CALCIUM 9.1 04/18/2017 0950   ALKPHOS 39 01/08/2020 1102   ALKPHOS 42 04/18/2017 0950   AST 19 01/08/2020 1102   AST 20 11/22/2018 1022   AST 28 04/18/2017 0950   ALT 11 01/08/2020 1102   ALT 11 11/22/2018 1022   ALT 18 04/18/2017 0950   BILITOT 0.5 01/08/2020 1102   BILITOT 0.4 11/22/2018 1022   BILITOT 0.55 04/18/2017 0950       No results found for: LABCA2  No components found for: OINOM767  No results for input(s): INR in the last 168 hours.  Urinalysis    Component Value Date/Time   COLORURINE YELLOW 08/09/2018 2130   APPEARANCEUR CLEAR 08/09/2018 2130   LABSPEC 1.008 08/09/2018 2130   PHURINE 5.0  08/09/2018 2130   GLUCOSEU NEGATIVE 08/09/2018 2130   GLUCOSEU NEGATIVE 07/01/2011 1203   HGBUR SMALL (A) 08/09/2018 2130   BILIRUBINUR NEGATIVE 08/09/2018 2130   BILIRUBINUR neg 05/02/2013 1215   KETONESUR NEGATIVE 08/09/2018 2130   PROTEINUR NEGATIVE 08/09/2018 2130   UROBILINOGEN 0.2 05/02/2013 1215   UROBILINOGEN 0.2 07/01/2011 1203   NITRITE NEGATIVE 08/09/2018 2130   LEUKOCYTESUR SMALL (A) 08/09/2018 2130     STUDIES: MM DIAG BREAST TOMO BILATERAL  Result Date: 12/19/2019 CLINICAL DATA:  79 year old female for annual follow-up. History of LEFT breast cancer and lumpectomy in 2018. EXAM: DIGITAL DIAGNOSTIC BILATERAL MAMMOGRAM WITH CAD AND TOMO COMPARISON:  Previous exam(s). ACR Breast Density Category a: The breast tissue is almost entirely fatty. FINDINGS: 2D and 3D full field views of both breasts and a magnification view of the lumpectomy site demonstrate no suspicious mass, nonsurgical distortion or worrisome calcifications. LEFT lumpectomy changes again noted. Mammographic images were processed with CAD. IMPRESSION: No evidence of breast malignancy. RECOMMENDATION: Bilateral diagnostic mammogram in 1 year. I have discussed the findings and recommendations with the patient. If applicable, a reminder letter will be sent to the patient regarding the next appointment. BI-RADS CATEGORY  2: Benign. Electronically Signed   By: Cleatis Polka.D.  On: 12/19/2019 14:03   CUP PACEART REMOTE DEVICE CHECK  Result Date: 12/17/2019 Scheduled remote reviewed. Normal device function. Next remote 91 days- JBox, RN/CVRS    ELIGIBLE FOR AVAILABLE RESEARCH PROTOCOL: No  ASSESSMENT: 79 y.o.  woman status post left breast upper inner quadrant biopsy 07/29/2016 for ductal carcinoma in situ, grade 2, estrogen and progesterone receptor positive  (1) Genetics testing 08/23/2016 through the Multi-Gene Panel offered by [facility] found no deleterious mutations in ALK, APC, ATM, AXIN2,BAP1,  BARD1,  BLM, BMPR1A, BRCA1, BRCA2, BRIP1, CASR, CDC73, CDH1, CDK4, CDKN1B, CDKN1C, CDKN2A (p14ARF), CDKN2A (p16INK4a), CEBPA, CHEK2, DICER1, CIS3L2, EGFR (c.2369C>T, p.Thr790Met variant only), EPCAM (Deletion/duplication testing only), FH, FLCN, GATA2, GPC3, GREM1 (Promoter region deletion/duplication testing only), HOXB13 (c.251G>A, p.Gly84Glu), HRAS, KIT, MAX, MEN1, MET, MITF (c.952G>A, p.Glu318Lys variant only), MLH1, MSH2, MSH6, MUTYH, NBN, NF1, NF2, PALB2, PDGFRA, PHOX2B, PMS2, POLD1, POLE, POT1, PRKAR1A, PTCH1, PTEN, RAD50, RAD51C, RAD51D, RB1, RECQL4, RET, RUNX1, SDHAF2, SDHA (sequence changes only), SDHB, SDHC, SDHD, SMAD4, SMARCA4, SMARCB1, SMARCE1, STK11, SUFU, TERT, TERT, TMEM127, TP53, TSC1, TSC2, VHL, WRN and WT1  (2) considered the COMET trial: patient declines  (3) left lumpectomy 09/19/2016 found ductal carcinoma in situ, grade 2, measuring 0.6 cm, with negative margins.  (4) advised against adjuvant radiation since there would be no survival advantage in this setting and the patient's pacemaker would have to be moved  (5) anastrozole started 09/27/2016  (a) bone density 08/25/2015 found a T score of -2.7, consistent with osteoporosis   (b) denosumab/Prolia started 10/25/2016, repeated every 24 weeks  (c) repeat bone density 08/16/2018 shows a T score of -2.9  (4) iron deficiency anemia  (a) status post Feraheme 04/30/2018  (b) EGD 07/03/2018 showed candidal esophagitis  (b) Feraheme on 05/28/2019      PLAN: Yolanda Huffman is now a little over 3 years out from definitive surgery for her breast cancer with no evidence of disease recurrence.  This is favorable.  She is tolerating anastrozole: Well the plan is to continue that for a total of 5 years.  She will return to see [place] in 6 and 12 months with labs visits and Prolia at the same day  She knows to call for any other issue that may develop before the next visit  Total encounter time 25 minutes.Lurline Del, MD  01/08/20 5:51  PM Medical Oncology and Hematology [facility] [address] [place], [place] [address] Tel. [phone]    Fax. [phone]   I, Jacqualyn Posey am acting as a Education administrator for Chauncey Cruel, MD.   I, Lurline Del MD, have reviewed the above documentation for accuracy and completeness, and I agree with the above.   *Total Encounter Time as defined by the Centers for Medicare and Medicaid Services includes, in addition to the face-to-face time of a patient visit (documented in the note above) non-face-to-face time: obtaining and reviewing outside history, ordering and reviewing medications, tests or procedures, care coordination (communications with other health care professionals or caregivers) and documentation in the medical record.

## 2020-01-08 ENCOUNTER — Inpatient Hospital Stay: Payer: Medicare PPO | Attending: Oncology

## 2020-01-08 ENCOUNTER — Inpatient Hospital Stay: Payer: Medicare PPO

## 2020-01-08 ENCOUNTER — Inpatient Hospital Stay: Payer: Medicare PPO | Admitting: Oncology

## 2020-01-08 ENCOUNTER — Other Ambulatory Visit: Payer: Self-pay

## 2020-01-08 VITALS — BP 124/61 | HR 95 | Temp 98.2°F | Resp 17 | Ht 63.5 in | Wt 150.6 lb

## 2020-01-08 DIAGNOSIS — E1122 Type 2 diabetes mellitus with diabetic chronic kidney disease: Secondary | ICD-10-CM

## 2020-01-08 DIAGNOSIS — M810 Age-related osteoporosis without current pathological fracture: Secondary | ICD-10-CM

## 2020-01-08 DIAGNOSIS — D0512 Intraductal carcinoma in situ of left breast: Secondary | ICD-10-CM | POA: Diagnosis not present

## 2020-01-08 DIAGNOSIS — D509 Iron deficiency anemia, unspecified: Secondary | ICD-10-CM | POA: Insufficient documentation

## 2020-01-08 DIAGNOSIS — N184 Chronic kidney disease, stage 4 (severe): Secondary | ICD-10-CM | POA: Diagnosis not present

## 2020-01-08 DIAGNOSIS — D508 Other iron deficiency anemias: Secondary | ICD-10-CM

## 2020-01-08 DIAGNOSIS — I129 Hypertensive chronic kidney disease with stage 1 through stage 4 chronic kidney disease, or unspecified chronic kidney disease: Secondary | ICD-10-CM

## 2020-01-08 DIAGNOSIS — I5043 Acute on chronic combined systolic (congestive) and diastolic (congestive) heart failure: Secondary | ICD-10-CM

## 2020-01-08 LAB — CBC WITH DIFFERENTIAL (CANCER CENTER ONLY)
Abs Immature Granulocytes: 0.02 10*3/uL (ref 0.00–0.07)
Basophils Absolute: 0.1 10*3/uL (ref 0.0–0.1)
Basophils Relative: 1 %
Eosinophils Absolute: 0.1 10*3/uL (ref 0.0–0.5)
Eosinophils Relative: 2 %
HCT: 34.7 % — ABNORMAL LOW (ref 36.0–46.0)
Hemoglobin: 11.4 g/dL — ABNORMAL LOW (ref 12.0–15.0)
Immature Granulocytes: 0 %
Lymphocytes Relative: 20 %
Lymphs Abs: 1.4 10*3/uL (ref 0.7–4.0)
MCH: 30.2 pg (ref 26.0–34.0)
MCHC: 32.9 g/dL (ref 30.0–36.0)
MCV: 91.8 fL (ref 80.0–100.0)
Monocytes Absolute: 0.6 10*3/uL (ref 0.1–1.0)
Monocytes Relative: 8 %
Neutro Abs: 4.8 10*3/uL (ref 1.7–7.7)
Neutrophils Relative %: 69 %
Platelet Count: 179 10*3/uL (ref 150–400)
RBC: 3.78 MIL/uL — ABNORMAL LOW (ref 3.87–5.11)
RDW: 14.1 % (ref 11.5–15.5)
WBC Count: 6.9 10*3/uL (ref 4.0–10.5)
nRBC: 0 % (ref 0.0–0.2)

## 2020-01-08 LAB — COMPREHENSIVE METABOLIC PANEL
ALT: 11 U/L (ref 0–44)
AST: 19 U/L (ref 15–41)
Albumin: 3.4 g/dL — ABNORMAL LOW (ref 3.5–5.0)
Alkaline Phosphatase: 39 U/L (ref 38–126)
Anion gap: 13 (ref 5–15)
BUN: 52 mg/dL — ABNORMAL HIGH (ref 8–23)
CO2: 23 mmol/L (ref 22–32)
Calcium: 9.7 mg/dL (ref 8.9–10.3)
Chloride: 100 mmol/L (ref 98–111)
Creatinine, Ser: 3.02 mg/dL (ref 0.44–1.00)
GFR calc Af Amer: 16 mL/min — ABNORMAL LOW (ref >60)
GFR calc non Af Amer: 14 mL/min — ABNORMAL LOW (ref >60)
Glucose, Bld: 177 mg/dL — ABNORMAL HIGH (ref 70–99)
Potassium: 3.6 mmol/L (ref 3.5–5.1)
Sodium: 136 mmol/L (ref 135–145)
Total Bilirubin: 0.5 mg/dL (ref 0.3–1.2)
Total Protein: 7.3 g/dL (ref 6.5–8.1)

## 2020-01-08 LAB — FERRITIN: Ferritin: 32 ng/mL (ref 11–307)

## 2020-01-08 LAB — IRON AND TIBC
Iron: 102 ug/dL (ref 41–142)
Saturation Ratios: 36 % (ref 21–57)
TIBC: 288 ug/dL (ref 236–444)
UIBC: 185 ug/dL (ref 120–384)

## 2020-01-08 MED ORDER — DENOSUMAB 60 MG/ML ~~LOC~~ SOSY
60.0000 mg | PREFILLED_SYRINGE | Freq: Once | SUBCUTANEOUS | Status: AC
  Administered 2020-01-08: 60 mg via SUBCUTANEOUS

## 2020-01-08 MED ORDER — DENOSUMAB 60 MG/ML ~~LOC~~ SOSY
PREFILLED_SYRINGE | SUBCUTANEOUS | Status: AC
  Filled 2020-01-08: qty 1

## 2020-01-24 ENCOUNTER — Telehealth: Payer: Self-pay | Admitting: Family Medicine

## 2020-01-24 MED ORDER — LEVEMIR FLEXTOUCH 100 UNIT/ML ~~LOC~~ SOPN: PEN_INJECTOR | SUBCUTANEOUS | 1 refills | Status: DC

## 2020-01-24 NOTE — Telephone Encounter (Signed)
Pt is calling in stating that she is out and needs Rx Levemir flextouch 100 unit flexpen.  Pharm:  [facility] [address]  336 [phone]. Marland Kitchen

## 2020-01-24 NOTE — Telephone Encounter (Signed)
Refill sent in

## 2020-02-05 ENCOUNTER — Encounter: Payer: Self-pay | Admitting: Family Medicine

## 2020-02-11 ENCOUNTER — Ambulatory Visit: Payer: Medicare PPO | Admitting: Family Medicine

## 2020-02-18 ENCOUNTER — Encounter: Payer: Self-pay | Admitting: Family Medicine

## 2020-02-18 ENCOUNTER — Ambulatory Visit: Payer: Medicare PPO | Admitting: Family Medicine

## 2020-02-18 ENCOUNTER — Other Ambulatory Visit: Payer: Self-pay

## 2020-02-18 VITALS — BP 120/62 | HR 95 | Temp 98.1°F | Wt 153.0 lb

## 2020-02-18 DIAGNOSIS — N184 Chronic kidney disease, stage 4 (severe): Secondary | ICD-10-CM | POA: Diagnosis not present

## 2020-02-18 DIAGNOSIS — E1122 Type 2 diabetes mellitus with diabetic chronic kidney disease: Secondary | ICD-10-CM

## 2020-02-18 DIAGNOSIS — I10 Essential (primary) hypertension: Secondary | ICD-10-CM

## 2020-02-18 DIAGNOSIS — I129 Hypertensive chronic kidney disease with stage 1 through stage 4 chronic kidney disease, or unspecified chronic kidney disease: Secondary | ICD-10-CM

## 2020-02-18 DIAGNOSIS — S60222A Contusion of left hand, initial encounter: Secondary | ICD-10-CM

## 2020-02-18 LAB — POCT GLYCOSYLATED HEMOGLOBIN (HGB A1C): Hemoglobin A1C: 6.2 % — AB (ref 4.0–5.6)

## 2020-02-18 NOTE — Patient Instructions (Signed)

## 2020-02-18 NOTE — Progress Notes (Signed)
Established Patient Office Visit  Subjective:  Patient ID: Yolanda Huffman, female    DOB: [birth year]  Age: 79 y.o. MRN: [id number]  CC:  Chief Complaint  Patient presents with  . Follow-up    pt is here for 6 month follow up     HPI Yolanda Huffman presents for medical follow-up.  She has history of combined systolic and diastolic heart failure, atrial fibrillation, CAD, hypertension, type 2 diabetes, chronic kidney disease stage IV, past history of duodenal ulcer, osteoporosis, history of breast cancer.  She is followed closely by oncology, cardiology, and nephrology.  Patient did have recent fall.  She states this was last week.  She states she felt slightly "off balance ".  She denies any loss of consciousness.  She fell at home on carpet.  She had some bruising of the left hand.  There was no head injury.  No loss of consciousness.  No headaches since then.  No extremity pain.  Denies any recent vertigo.  No focal weakness.  No confusion.  EMS was called and they checked her over but did not recommend she go into the hospital.  She has not had any recent hypoglycemic symptoms.  She does not recall if her blood sugar was checked.  She is not describing any recent hypoglycemia symptoms.  She had multiple recent labs through cardiology back in July including CBC, comprehensive metabolic panel, ferritin, iron level and these were all stable.  Iron levels were normal.  Diabetes has been well controlled.  No polyuria or polydipsia.  Remains on low-dose Levemir 12 units daily  Past Medical History:  Diagnosis Date  . Anemia   . Arthritis    gout  . Atrial fibrillation ([facility])    prior Sotalol - d/c'd 2/2 increased creatinine; rate control strategy  . CAD (coronary artery disease)    s/p PCI in [place] in 1/09  . Cancer [facility])    left breast  . Chronic combined systolic and diastolic heart failure (HCC)    Echocardiogram 3/12: Mild LVH, EF 50-56%, normal diastolic function, mild AI,  mild MR, PASP 44, normal wall motion  . CKD (chronic kidney disease)    sees Dr. Burman Foster - Stage 3 ?  Marland Kitchen Diabetes mellitus   . Hyperlipemia   . Hypertension   . MI, old    2009  . Osteoporosis   . Pacemaker   . Pleural effusion   . Pneumonia   . PUD (peptic ulcer disease) 10/2010   duodenal ulcer    Past Surgical History:  Procedure Laterality Date  . BREAST BIOPSY Left 07/29/2016  . BREAST LUMPECTOMY Left 09/19/2016  . BREAST LUMPECTOMY WITH RADIOACTIVE SEED LOCALIZATION Left 09/19/2016   Procedure: LEFT BREAST LUMPECTOMY WITH RADIOACTIVE SEED LOCALIZATION;  Surgeon: Autumn Messing III, MD;  Location: [facility];  Service: General;  Laterality: Left;  . CATARACT EXTRACTION Bilateral    01/12/09 and 01/26/09 both eyes  . COLONOSCOPY    . CORONARY ANGIOPLASTY  2009  . PACEMAKER INSERTION  07/13/07  . PPM GENERATOR CHANGEOUT N/A 12/17/2018   Procedure: PPM GENERATOR CHANGEOUT;  Surgeon: Evans Lance, MD;  Location: [place] [facility];  Service: Cardiovascular;  Laterality: N/A;  . stent implant  07/13/2007  . TOTAL HIP ARTHROPLASTY Right 07/23/2017   Procedure: TOTAL HIP ARTHROPLASTY ANTERIOR APPROACH;  Surgeon: Mcarthur Rossetti, MD;  Location: [facility];  Service: Orthopedics;  Laterality: Right;    Family History  Problem Relation Age of Onset  .  Heart disease Father   . Heart attack Father   . Clotting disorder Father        blood clot  . Hypertension Father   . Heart disease Mother   . Breast cancer Maternal Aunt 1  . Breast cancer Sister 28  . Breast cancer Other 40  . Colon cancer Neg Hx     Social History   Socioeconomic History  . Marital status: Widowed    Spouse name: Not on file  . Number of children: 1  . Years of education: Not on file  . Highest education level: Not on file  Occupational History  . Occupation: retired Theatre stage manager: RETIRED  Tobacco Use  . Smoking status: Former Smoker    Packs/day: 2.00    Years: 20.00    Pack years: 40.00     Types: Cigarettes    Quit date: 11/26/1991    Years since quitting: 28.2  . Smokeless tobacco: Never Used  Vaping Use  . Vaping Use: Never used  Substance and Sexual Activity  . Alcohol use: No  . Drug use: No  . Sexual activity: Not on file  Other Topics Concern  . Not on file  Social History Narrative   Widowed   [place] 1 resides in senior apartment    1 daughter local; no grandchildren      Social Determinants of Health   Financial Resource Strain: Low Risk   . Difficulty of Paying Living Expenses: Not hard at all  Food Insecurity: No Food Insecurity  . Worried About Charity fundraiser in the Last Year: Never true  . Ran Out of Food in the Last Year: Never true  Transportation Needs: No Transportation Needs  . Lack of Transportation (Medical): No  . Lack of Transportation (Non-Medical): No  Physical Activity: Insufficiently Active  . Days of Exercise per Week: 4 days  . Minutes of Exercise per Session: 20 min  Stress:   . Feeling of Stress : Not on file  Social Connections: Moderately Isolated  . Frequency of Communication with Friends and Family: More than three times a week  . Frequency of Social Gatherings with Friends and Family: Once a week  . Attends Religious Services: Never  . Active Member of Clubs or Organizations: Yes  . Attends Archivist Meetings: Never  . Marital Status: Widowed  Intimate Partner Violence:   . Fear of Current or Ex-Partner: Not on file  . Emotionally Abused: Not on file  . Physically Abused: Not on file  . Sexually Abused: Not on file    Outpatient Medications Prior to Visit  Medication Sig Dispense Refill  . acetaminophen (TYLENOL) 500 MG tablet Take 500-1,000 mg by mouth daily as needed for moderate pain.    Marland Kitchen anastrozole (ARIMIDEX) 1 MG tablet TAKE ONE TABLET BY MOUTH DAILY 90 tablet 1  . atorvastatin (LIPITOR) 40 MG tablet TAKE ONE TABLET BY MOUTH DAILY 90 tablet 3  . calcium carbonate (TUMS - DOSED IN MG ELEMENTAL  CALCIUM) 500 MG chewable tablet Chew 1 tablet by mouth See admin instructions. Tuesday, Thursday, Saturday and Sunday    . Cranberry 500 MG CAPS Take 500 mg by mouth daily with lunch.    . denosumab (PROLIA) 60 MG/ML SOSY injection Inject 60 mg into the skin every 6 (six) months.    . furosemide (LASIX) 40 MG tablet TAKE TWO TABLETS BY MOUTH EVERY MORNING AND TAKE ONE TABLET BY MOUTH EVERY EVENING 270 tablet  1  . glucose blood test strip 1 each by Other route 3 (three) times daily. Use as instructed     . insulin detemir (LEVEMIR FLEXTOUCH) 100 UNIT/ML FlexPen INJECT 12 UNITS UNDER THE SKIN DAILY 15 mL 1  . Insulin Pen Needle (BD PEN NEEDLE NANO U/F) 32G X 4 MM MISC Use to inject Levemir one time daily. Dx Code E11.9 100 each 1  . KLOR-CON M20 20 MEQ tablet TAKE TWO TABLETS BY MOUTH TWICE A DAY WITH FOOD AND WATER 360 tablet 3  . metolazone (ZAROXOLYN) 2.5 MG tablet TAKE ONE TABLET BY MOUTH DAILY ON MONDAY, WEDNESDAY, AND FRIDAY 39 tablet 0  . metoprolol succinate (TOPROL-XL) 25 MG 24 hr tablet TAKE 2 TABLETS BY MOUTH EVERY MORNING AND 1 TABLET EVERY EVENING 270 tablet 1  . Multiple Vitamin (MULTIVITAMIN WITH MINERALS) TABS tablet Take 1 tablet by mouth daily. FOR ADULT 50+    . Omega-3 Fatty Acids (FISH OIL) 1000 MG CAPS Take 1,000 mg by mouth daily.     . pantoprazole (PROTONIX) 40 MG tablet TAKE ONE TABLET BY MOUTH DAILY 90 tablet 3   No facility-administered medications prior to visit.    Allergies  Allergen Reactions  . Amlodipine Besylate Swelling    ROS Review of Systems  Constitutional: Negative for chills, fever and unexpected weight change.  Eyes: Negative for visual disturbance.  Respiratory: Negative for cough, chest tightness, shortness of breath and wheezing.   Cardiovascular: Negative for chest pain, palpitations and leg swelling.  Endocrine: Negative for polydipsia and polyuria.  Genitourinary: Negative for dysuria.  Neurological: Negative for seizures, syncope, facial  asymmetry, speech difficulty, weakness and headaches.      Objective:    Physical Exam Constitutional:      Appearance: She is well-developed.  Eyes:     Pupils: Pupils are equal, round, and reactive to light.  Neck:     Thyroid: No thyromegaly.     Vascular: No JVD.  Cardiovascular:     Rate and Rhythm: Normal rate and regular rhythm.     Heart sounds: No gallop.   Pulmonary:     Effort: Pulmonary effort is normal. No respiratory distress.     Breath sounds: Normal breath sounds. No wheezing or rales.  Musculoskeletal:     Cervical back: Neck supple.     Comments: She has some bruising involving the dorsum of the left hand and extending down to the index finger and proximal third finger.  There is no bony tenderness he has full range of motion in all fingers of the hand.  Neurological:     General: No focal deficit present.     Mental Status: She is alert.     Cranial Nerves: No cranial nerve deficit.     BP 120/62 (BP Location: Left Arm, Patient Position: Sitting, Cuff Size: Normal)   Pulse 95   Temp 98.1 F (36.7 C) (Oral)   Wt 153 lb (69.4 kg)   SpO2 96%   BMI 26.68 kg/m  Wt Readings from Last 3 Encounters:  02/18/20 153 lb (69.4 kg)  01/08/20 150 lb 9.6 oz (68.3 kg)  12/27/19 149 lb (67.6 kg)     Health Maintenance Due  Topic Date Due  . Hepatitis C Screening  Never done  . TETANUS/TDAP  Never done  . INFLUENZA VACCINE  01/26/2020    There are no preventive care reminders to display for this patient.  Lab Results  Component Value Date   TSH 1.55 08/04/2015  Lab Results  Component Value Date   WBC 6.9 01/08/2020   HGB 11.4 (L) 01/08/2020   HCT 34.7 (L) 01/08/2020   MCV 91.8 01/08/2020   PLT 179 01/08/2020   Lab Results  Component Value Date   NA 136 01/08/2020   K 3.6 01/08/2020   CHLORIDE 96 (L) 04/18/2017   CO2 23 01/08/2020   GLUCOSE 177 (H) 01/08/2020   BUN 52 (H) 01/08/2020   CREATININE 3.02 (HH) 01/08/2020   BILITOT 0.5 01/08/2020    ALKPHOS 39 01/08/2020   AST 19 01/08/2020   ALT 11 01/08/2020   PROT 7.3 01/08/2020   ALBUMIN 3.4 (L) 01/08/2020   CALCIUM 9.7 01/08/2020   ANIONGAP 13 01/08/2020   EGFR 18 (L) 04/18/2017   GFR 18.12 (L) 08/08/2018   Lab Results  Component Value Date   CHOL 143 04/05/2019   Lab Results  Component Value Date   HDL 59.70 04/05/2019   Lab Results  Component Value Date   LDLCALC 56 04/05/2019   Lab Results  Component Value Date   TRIG 136.0 04/05/2019   Lab Results  Component Value Date   CHOLHDL 2 04/05/2019   Lab Results  Component Value Date   HGBA1C 6.2 (A) 02/18/2020      Assessment & Plan:   #1 type 2 diabetes.  This remains well controlled with A1c 6.2%  -Continue low-dose Levemir. -Reviewed signs and symptoms of hypoglycemia that she has had none recently  #2 recent fall with contusion left hand.  No history of reported head injury.  Low clinical suspicion for acute bony injury such as fracture with no reproducible tenderness.   -We discussed fall prevention in the home with handout given -We discussed possible referral to home health physical therapy for fall prevention but at this point she declines  #3 hypertension stable and at goal  #4 chronic kidney disease stage IV followed by nephrology  No orders of the defined types were placed in this encounter.   Follow-up: Return in about 6 months (around 08/20/2020).    Carolann Littler, MD

## 2020-02-26 ENCOUNTER — Ambulatory Visit: Payer: Medicare PPO | Admitting: Cardiology

## 2020-03-03 ENCOUNTER — Telehealth (INDEPENDENT_AMBULATORY_CARE_PROVIDER_SITE_OTHER): Payer: Medicare PPO | Admitting: Cardiology

## 2020-03-03 ENCOUNTER — Telehealth: Payer: Self-pay

## 2020-03-03 ENCOUNTER — Encounter: Payer: Self-pay | Admitting: Cardiology

## 2020-03-03 VITALS — BP 121/80 | HR 73 | Ht 63.5 in | Wt 146.0 lb

## 2020-03-03 DIAGNOSIS — I495 Sick sinus syndrome: Secondary | ICD-10-CM | POA: Diagnosis not present

## 2020-03-03 DIAGNOSIS — I071 Rheumatic tricuspid insufficiency: Secondary | ICD-10-CM

## 2020-03-03 DIAGNOSIS — I340 Nonrheumatic mitral (valve) insufficiency: Secondary | ICD-10-CM | POA: Diagnosis not present

## 2020-03-03 DIAGNOSIS — Z950 Presence of cardiac pacemaker: Secondary | ICD-10-CM

## 2020-03-03 DIAGNOSIS — I4821 Permanent atrial fibrillation: Secondary | ICD-10-CM

## 2020-03-03 DIAGNOSIS — I5042 Chronic combined systolic (congestive) and diastolic (congestive) heart failure: Secondary | ICD-10-CM

## 2020-03-03 NOTE — Progress Notes (Signed)
Virtual Visit via Telephone Note   This visit type was conducted due to national recommendations for restrictions regarding the COVID-19 Pandemic (e.g. social distancing) in an effort to limit this patient's exposure and mitigate transmission in our community.  Due to her co-morbid illnesses, this patient is at least at moderate risk for complications without adequate follow up.  This format is felt to be most appropriate for this patient at this time.  The patient did not have access to video technology/had technical difficulties with video requiring transitioning to audio format only (telephone).  All issues noted in this document were discussed and addressed.  No physical exam could be performed with this format.  Please refer to the patient's chart for her  consent to telehealth for [facility].   Date:  03/03/2020   ID:  Yolanda Huffman, DOB 03/27/1941, MRN [id number]  Patient Location: Home Provider Location: Home  PCP:  Eulas Post, MD  Cardiologist:  Buford Dresser, MD  Electrophysiologist:  Dr. Lovena Le   Evaluation Performed:  Follow-Up Visit  Chief Complaint:  Follow up  History of Present Illness:    Yolanda Huffman is a 79 y.o. 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 My initial visit with her was on 09/04/18.  The patient does not have symptoms concerning for COVID-19 infection (fever, chills, cough, or new shortness of breath).   Today: Doing well. Staying safe from Covid, did well with both vaccinations. Walks around her apartment complex. Discussed precautions, as she is very concerned. We spent significant time today discussing safety in the Covid pandemic.  Recently seen by Dr. Lovena Le, note reviewed. No further GI bleeding, taking OTC iron supplements. Her stools  are dark from the iron but not sticky.  Overall feels that she is stable. Tolerating all medications.  Denies chest pain, shortness of breath at rest or with normal exertion. No PND, orthopnea, LE edema or unexpected weight gain. No syncope or palpitations.   Past Medical History:  Diagnosis Date  . Anemia   . Arthritis    gout  . Atrial fibrillation ([place])    prior Sotalol - d/c'd 2/2 increased creatinine; rate control strategy  . CAD (coronary artery disease)    s/p PCI in [place] in 1/09  . Cancer [facility])    left breast  . Chronic combined systolic and diastolic heart failure (HCC)    Echocardiogram 3/12: Mild LVH, EF 11-94%, normal diastolic function, mild AI, mild MR, PASP 44, normal wall motion  . CKD (chronic kidney disease)    sees Dr. Burman Foster - Stage 3 ?  Marland Kitchen Diabetes mellitus   . Hyperlipemia   . Hypertension   . MI, old    2009  . Osteoporosis   . Pacemaker   . Pleural effusion   . Pneumonia   . PUD (peptic ulcer disease) 10/2010   duodenal ulcer   Past Surgical History:  Procedure Laterality Date  . BREAST BIOPSY Left 07/29/2016  . BREAST LUMPECTOMY Left 09/19/2016  . BREAST LUMPECTOMY WITH RADIOACTIVE SEED LOCALIZATION Left 09/19/2016   Procedure: LEFT BREAST LUMPECTOMY WITH RADIOACTIVE SEED LOCALIZATION;  Surgeon: Autumn Messing III, MD;  Location: [place];  Service: General;  Laterality: Left;  . CATARACT EXTRACTION Bilateral    01/12/09 and 01/26/09 both eyes  . COLONOSCOPY    . CORONARY ANGIOPLASTY  2009  .  PACEMAKER INSERTION  07/13/07  . PPM GENERATOR CHANGEOUT N/A 12/17/2018   Procedure: PPM GENERATOR CHANGEOUT;  Surgeon: Evans Lance, MD;  Location: [place] [facility];  Service: Cardiovascular;  Laterality: N/A;  . stent implant  07/13/2007  . TOTAL HIP ARTHROPLASTY Right 07/23/2017   Procedure: TOTAL HIP ARTHROPLASTY ANTERIOR APPROACH;  Surgeon: Mcarthur Rossetti, MD;  Location: [place];  Service: Orthopedics;  Laterality: Right;     Current Meds   Medication Sig  . acetaminophen (TYLENOL) 500 MG tablet Take 500-1,000 mg by mouth daily as needed for moderate pain.  Marland Kitchen anastrozole (ARIMIDEX) 1 MG tablet TAKE ONE TABLET BY MOUTH DAILY  . atorvastatin (LIPITOR) 40 MG tablet TAKE ONE TABLET BY MOUTH DAILY  . calcium carbonate (TUMS - DOSED IN MG ELEMENTAL CALCIUM) 500 MG chewable tablet Chew 1 tablet by mouth See admin instructions. Tuesday, Thursday, Saturday and Sunday  . Cranberry 500 MG CAPS Take 500 mg by mouth daily with lunch.  . denosumab (PROLIA) 60 MG/ML SOSY injection Inject 60 mg into the skin every 6 (six) months.  . Ferrous Sulfate (IRON) 325 (65 Fe) MG TABS Take by mouth.  . furosemide (LASIX) 40 MG tablet TAKE TWO TABLETS BY MOUTH EVERY MORNING AND TAKE ONE TABLET BY MOUTH EVERY EVENING  . glucose blood test strip 1 each by Other route 3 (three) times daily. Use as instructed   . insulin detemir (LEVEMIR FLEXTOUCH) 100 UNIT/ML FlexPen INJECT 12 UNITS UNDER THE SKIN DAILY  . Insulin Pen Needle (BD PEN NEEDLE NANO U/F) 32G X 4 MM MISC Use to inject Levemir one time daily. Dx Code E11.9  . KLOR-CON M20 20 MEQ tablet TAKE TWO TABLETS BY MOUTH TWICE A DAY WITH FOOD AND WATER  . metolazone (ZAROXOLYN) 2.5 MG tablet TAKE ONE TABLET BY MOUTH DAILY ON MONDAY, WEDNESDAY, AND FRIDAY  . metoprolol succinate (TOPROL-XL) 25 MG 24 hr tablet TAKE 2 TABLETS BY MOUTH EVERY MORNING AND 1 TABLET EVERY EVENING  . Multiple Vitamin (MULTIVITAMIN WITH MINERALS) TABS tablet Take 1 tablet by mouth daily. FOR ADULT 50+  . Omega-3 Fatty Acids (FISH OIL) 1000 MG CAPS Take 1,000 mg by mouth daily.   . pantoprazole (PROTONIX) 40 MG tablet TAKE ONE TABLET BY MOUTH DAILY     Allergies:   Amlodipine besylate   Social History   Tobacco Use  . Smoking status: Former Smoker    Packs/day: 2.00    Years: 20.00    Pack years: 40.00    Types: Cigarettes    Quit date: 11/26/1991    Years since quitting: 28.2  . Smokeless tobacco: Never Used  Vaping Use  .  Vaping Use: Never used  Substance Use Topics  . Alcohol use: No  . Drug use: No     Family Hx: The patient's family history includes Breast cancer (age of onset: 76) in an other family member; Breast cancer (age of onset: 28) in her maternal aunt; Breast cancer (age of onset: 35) in her sister; Clotting disorder in her father; Heart attack in her father; Heart disease in her father and mother; Hypertension in her father. There is no history of Colon cancer.  ROS:   Please see the history of present illness. ROS otherwise unremarkable except as noted.   Prior CV studies:   The following studies were reviewed today: Echo 04/05/18 - Left ventricle: The cavity size was normal. Systolic function was mildly reduced. The estimated ejection fraction was in the range of 45% to  50%. Dysynchronous contraction of the ventricular septum with paradoxical motion likely secondary to pacing. There were no other regional wall motion abnormalities. The study is not technically sufficient to allow evaluation of LV diastolic function. - Aortic valve: A bicuspid morphology cannot be excluded (congenital bicuspid with right and left cusp fusion and disproportionately larger non coronary cusp vs. acquired functional bicuspid with calcific sclerotic fusion); severe leaflet sclerosis. Valve mobility was restricted. Transvalvular velocity was within the normal range. There was no stenosis. There was at least moderate regurgitation, eccentric, directed toward the anterior mitral leaflet with evidence of mitral leaflet fluttering on m-mode. Mean gradient (S): 6 mm Hg. - Aorta: Dilated aortic root - Aortic root dimension: 41.7 mm. - Mitral valve: There was moderate to severe regurgitation. - Left atrium: The atrium was severely dilated. - Pulmonary veins: Possible systolic reversal in the right lower pulmonary vein that was interrogated, though Doppler signal is somewhat  indeterminate. - Right ventricle: The cavity size was moderately dilated. Wall thickness was normal. Pacer wire or catheter noted in right ventricle. Systolic function was mildly reduced. - Right atrium: The atrium was moderately to severely dilated. - Tricuspid valve: There was severe regurgitation. - Pulmonary arteries: PA peak pressure: 52 mm Hg (S). - Inferior vena cava: The vessel was dilated. The respirophasic diameter changes were blunted (<50%), consistent with elevated central venous pressure. - Hepatic veins: Prominent systolic flow reversal noted. - Pericardium, extracardiac: There was no pericardial effusion.  Recommendations: Recommend transesophageal echocardiogram to better evaluate severity of multi-valvular heart disease including aortic valve (congenital bicuspid R-L fusion vs. acquired functional bicuspid, with regurgitation), tricuspid valve in setting of pacemaker lead, and mitral valve (PISA quantitation may be underestimating severity due to mitral leaflet thickening).  Labs/Other Tests and Data Reviewed:    EKG:  An ECG dated 12/27/19 was personally reviewed today and demonstrated:  afib, v paced  Recent Labs: 01/08/2020: ALT 11; BUN 52; Creatinine, Ser 3.02; Hemoglobin 11.4; Platelet Count 179; Potassium 3.6; Sodium 136   Recent Lipid Panel Lab Results  Component Value Date/Time   CHOL 143 04/05/2019 10:40 AM   TRIG 136.0 04/05/2019 10:40 AM   HDL 59.70 04/05/2019 10:40 AM   CHOLHDL 2 04/05/2019 10:40 AM   LDLCALC 56 04/05/2019 10:40 AM   LDLDIRECT 89.2 06/03/2010 09:46 AM    Wt Readings from Last 3 Encounters:  03/03/20 146 lb (66.2 kg)  02/18/20 153 lb (69.4 kg)  01/08/20 150 lb 9.6 oz (68.3 kg)     Objective:    Vital Signs:  BP 121/80   Pulse 73   Ht 5' 3.5" (1.613 m)   Wt 146 lb (66.2 kg)   BMI 25.46 kg/m    Speaking comfortably on the phone, no audible wheezing In no acute distress Alert and oriented Normal affect Normal  speech  ASSESSMENT & PLAN:    Mitral regurgitation, moderate to severe, with severe TR Chronic systolic and diastolic heart failure -NYHA class I currently though activity predominantly limited to walking -she denies any heart failure symptoms -does not want TEE unless symptoms change -continue current diuretic regimen of furosemide and metolazone, has potassium supplementation -echo would not change management at this time -given CKD stage, not a candidate for ACEi, ARB, ARNI, or MRA -already on metoprolol succinate -reviewed salt avoidance, daily weights  Permanent atrial fibrillation -CHA2DS2/VAS Stroke Risk Points  =  7 -severe GI bleed with hospitalization. Capsule study showed AVMs but not active bleeding -we have discussed risk and benefit of  anticoagulation on multiple occasions. She does not wish to restart anticoagulation, understands risk of stroke but is more concerned about GI bleed. -no further GI bleeding. Taking OTC iron tablets  Sinus node dysfunction s/p PPM: -followed by Dr. Lovena Le  Type II diabetes, on insulin -per Dr Elease Hashimoto -with Cr 3.02, not a candidate for SGLT2i  Hyperlipidemia: -continue atorvastatin 40 mg daily  Cardiac risk counseling and prevention recommendations: -recommend heart healthy/Mediterranean diet, with whole grains, fruits, vegetable, fish, lean meats, nuts, and olive oil. Limit salt. -recommend moderate walking, 3-5 times/week for 30-50 minutes each session. Aim for at least 150 minutes.week. Goal should be pace of 3 miles/hours, or walking 1.5 miles in 30 minutes -recommend avoidance of tobacco products. Avoid excess alcohol.  COVID-19 Education: The signs and symptoms of COVID-19 were discussed with the patient and how to seek care for testing (follow up with PCP or arrange E-visit).  The importance of social distancing was discussed today.  Today, I have spent 20 minutes with the patient with telehealth technology discussing the  above problems.  Additional time spent in chart review, documentation, and communication.   Patient Instructions  Medication Instructions:  Your physician recommends that you continue on your current medications as directed. Please refer to the Current Medication list given to you today.  *If you need a refill on your cardiac medications before your next appointment, please call your pharmacy*  Lab Work: NONE ordered at this time of appointment   If you have labs (blood work) drawn today and your tests are completely normal, you will receive your results only by: Marland Kitchen MyChart Message (if you have MyChart) OR . A paper copy in the mail If you have any lab test that is abnormal or we need to change your treatment, we will call you to review the results.  Testing/Procedures: NONE ordered at this time of appointment   Follow-Up: At [facility], you and your health needs are our priority.  As part of our continuing mission to provide you with exceptional heart care, we have created designated Provider Care Teams.  These Care Teams include your primary Cardiologist (physician) and Advanced Practice Providers (APPs -  Physician Assistants and Nurse Practitioners) who all work together to provide you with the care you need, when you need it.  We recommend signing up for the patient portal called "MyChart".  Sign up information is provided on this After Visit Summary.  MyChart is used to connect with patients for Virtual Visits (Telemedicine).  Patients are able to view lab/test results, encounter notes, upcoming appointments, etc.  Non-urgent messages can be sent to your provider as well.   To learn more about what you can do with MyChart, go to [email or web address].    Your next appointment:   6 month(s)  The format for your next appointment:   Either in person or virtual  Provider:   Buford Dresser, MD  Other Instructions     Follow Up:  6 mos or sooner  PRN  Signed, Buford Dresser, MD  03/03/2020 9:57 PM    [place]

## 2020-03-03 NOTE — Telephone Encounter (Signed)
°  Patient Consent for Virtual Visit         Yolanda Huffman has provided verbal consent on 03/03/2020 for a virtual visit (video or telephone).   CONSENT FOR VIRTUAL VISIT FOR:  Yolanda Huffman  By participating in this virtual visit I agree to the following:  I hereby voluntarily request, consent and authorize [facility] and its employed or contracted physicians, physician assistants, nurse practitioners or other licensed health care professionals (the Practitioner), to provide me with telemedicine health care services (the Services") as deemed necessary by the treating Practitioner. I acknowledge and consent to receive the Services by the Practitioner via telemedicine. I understand that the telemedicine visit will involve communicating with the Practitioner through live audiovisual communication technology and the disclosure of certain medical information by electronic transmission. I acknowledge that I have been given the opportunity to request an in-person assessment or other available alternative prior to the telemedicine visit and am voluntarily participating in the telemedicine visit.  I understand that I have the right to withhold or withdraw my consent to the use of telemedicine in the course of my care at any time, without affecting my right to future care or treatment, and that the Practitioner or I may terminate the telemedicine visit at any time. I understand that I have the right to inspect all information obtained and/or recorded in the course of the telemedicine visit and may receive copies of available information for a reasonable fee.  I understand that some of the potential risks of receiving the Services via telemedicine include:   Delay or interruption in medical evaluation due to technological equipment failure or disruption;  Information transmitted may not be sufficient (e.g. poor resolution of images) to allow for appropriate medical decision making by the  Practitioner; and/or   In rare instances, security protocols could fail, causing a breach of personal health information.  Furthermore, I acknowledge that it is my responsibility to provide information about my medical history, conditions and care that is complete and accurate to the best of my ability. I acknowledge that Practitioner's advice, recommendations, and/or decision may be based on factors not within their control, such as incomplete or inaccurate data provided by me or distortions of diagnostic images or specimens that may result from electronic transmissions. I understand that the practice of medicine is not an exact science and that Practitioner makes no warranties or guarantees regarding treatment outcomes. I acknowledge that a copy of this consent can be made available to me via my patient portal ([place]), or I can request a printed copy by calling the office of [facility].    I understand that my insurance will be billed for this visit.   I have read or had this consent read to me.  I understand the contents of this consent, which adequately explains the benefits and risks of the Services being provided via telemedicine.   I have been provided ample opportunity to ask questions regarding this consent and the Services and have had my questions answered to my satisfaction.  I give my informed consent for the services to be provided through the use of telemedicine in my medical care

## 2020-03-03 NOTE — Patient Instructions (Signed)
Medication Instructions:  Your physician recommends that you continue on your current medications as directed. Please refer to the Current Medication list given to you today.  *If you need a refill on your cardiac medications before your next appointment, please call your pharmacy*  Lab Work: NONE ordered at this time of appointment   If you have labs (blood work) drawn today and your tests are completely normal, you will receive your results only by: Marland Kitchen MyChart Message (if you have MyChart) OR . A paper copy in the mail If you have any lab test that is abnormal or we need to change your treatment, we will call you to review the results.  Testing/Procedures: NONE ordered at this time of appointment   Follow-Up: At [facility], you and your health needs are our priority.  As part of our continuing mission to provide you with exceptional heart care, we have created designated Provider Care Teams.  These Care Teams include your primary Cardiologist (physician) and Advanced Practice Providers (APPs -  Physician Assistants and Nurse Practitioners) who all work together to provide you with the care you need, when you need it.  We recommend signing up for the patient portal called "MyChart".  Sign up information is provided on this After Visit Summary.  MyChart is used to connect with patients for Virtual Visits (Telemedicine).  Patients are able to view lab/test results, encounter notes, upcoming appointments, etc.  Non-urgent messages can be sent to your provider as well.   To learn more about what you can do with MyChart, go to [email or web address].    Your next appointment:   6 month(s)  The format for your next appointment:   Either in person or virtual  Provider:   Buford Dresser, MD  Other Instructions

## 2020-03-16 DIAGNOSIS — N184 Chronic kidney disease, stage 4 (severe): Secondary | ICD-10-CM | POA: Diagnosis not present

## 2020-03-17 ENCOUNTER — Ambulatory Visit (INDEPENDENT_AMBULATORY_CARE_PROVIDER_SITE_OTHER): Payer: Medicare PPO | Admitting: *Deleted

## 2020-03-17 DIAGNOSIS — I4821 Permanent atrial fibrillation: Secondary | ICD-10-CM | POA: Diagnosis not present

## 2020-03-17 LAB — CUP PACEART REMOTE DEVICE CHECK
Battery Remaining Longevity: 96 mo
Battery Voltage: 3 V
Brady Statistic AP VP Percent: 0 %
Brady Statistic AP VS Percent: 0 %
Brady Statistic AS VP Percent: 99.8 %
Brady Statistic AS VS Percent: 0.2 %
Brady Statistic RA Percent Paced: 0 %
Brady Statistic RV Percent Paced: 99.8 %
Date Time Interrogation Session: 20210920202157
Implantable Lead Implant Date: 20090116
Implantable Lead Implant Date: 20090116
Implantable Lead Location: 753859
Implantable Lead Location: 753860
Implantable Lead Model: 5076
Implantable Lead Model: 5076
Implantable Pulse Generator Implant Date: 20200622
Lead Channel Impedance Value: 285 Ohm
Lead Channel Impedance Value: 285 Ohm
Lead Channel Impedance Value: 361 Ohm
Lead Channel Impedance Value: 380 Ohm
Lead Channel Pacing Threshold Amplitude: 1.5 V
Lead Channel Pacing Threshold Pulse Width: 0.4 ms
Lead Channel Sensing Intrinsic Amplitude: 0.75 mV
Lead Channel Sensing Intrinsic Amplitude: 3.375 mV
Lead Channel Sensing Intrinsic Amplitude: 3.375 mV
Lead Channel Setting Pacing Amplitude: 3.25 V
Lead Channel Setting Pacing Pulse Width: 0.4 ms
Lead Channel Setting Sensing Sensitivity: 1.2 mV

## 2020-03-18 NOTE — Progress Notes (Signed)
Remote pacemaker transmission.   

## 2020-03-31 ENCOUNTER — Other Ambulatory Visit: Payer: Self-pay | Admitting: Family Medicine

## 2020-03-31 ENCOUNTER — Other Ambulatory Visit: Payer: Self-pay | Admitting: Oncology

## 2020-03-31 ENCOUNTER — Other Ambulatory Visit: Payer: Self-pay | Admitting: Internal Medicine

## 2020-04-01 ENCOUNTER — Other Ambulatory Visit: Payer: Self-pay

## 2020-04-01 ENCOUNTER — Other Ambulatory Visit: Payer: Self-pay | Admitting: Oncology

## 2020-04-01 ENCOUNTER — Other Ambulatory Visit: Payer: Self-pay | Admitting: Family Medicine

## 2020-04-01 MED ORDER — POTASSIUM CHLORIDE CRYS ER 20 MEQ PO TBCR: EXTENDED_RELEASE_TABLET | ORAL | 2 refills | Status: DC

## 2020-04-06 ENCOUNTER — Other Ambulatory Visit: Payer: Self-pay | Admitting: Internal Medicine

## 2020-05-13 ENCOUNTER — Other Ambulatory Visit: Payer: Self-pay

## 2020-05-13 ENCOUNTER — Ambulatory Visit (INDEPENDENT_AMBULATORY_CARE_PROVIDER_SITE_OTHER): Payer: Medicare PPO | Admitting: *Deleted

## 2020-05-13 DIAGNOSIS — Z23 Encounter for immunization: Secondary | ICD-10-CM

## 2020-05-31 ENCOUNTER — Other Ambulatory Visit: Payer: Self-pay | Admitting: Oncology

## 2020-05-31 ENCOUNTER — Other Ambulatory Visit: Payer: Self-pay | Admitting: Family Medicine

## 2020-06-09 ENCOUNTER — Encounter: Payer: Self-pay | Admitting: Family Medicine

## 2020-06-13 ENCOUNTER — Encounter: Payer: Self-pay | Admitting: Family Medicine

## 2020-06-17 ENCOUNTER — Ambulatory Visit (INDEPENDENT_AMBULATORY_CARE_PROVIDER_SITE_OTHER): Payer: Medicare PPO

## 2020-06-17 DIAGNOSIS — I495 Sick sinus syndrome: Secondary | ICD-10-CM | POA: Diagnosis not present

## 2020-06-17 DIAGNOSIS — N184 Chronic kidney disease, stage 4 (severe): Secondary | ICD-10-CM | POA: Diagnosis not present

## 2020-06-17 LAB — CUP PACEART REMOTE DEVICE CHECK
Battery Remaining Longevity: 99 mo
Battery Voltage: 3 V
Brady Statistic AP VP Percent: 0 %
Brady Statistic AP VS Percent: 0 %
Brady Statistic AS VP Percent: 99.84 %
Brady Statistic AS VS Percent: 0.16 %
Brady Statistic RA Percent Paced: 0 %
Brady Statistic RV Percent Paced: 99.84 %
Date Time Interrogation Session: 20211221192220
Implantable Lead Implant Date: 20090116
Implantable Lead Implant Date: 20090116
Implantable Lead Location: 753859
Implantable Lead Location: 753860
Implantable Lead Model: 5076
Implantable Lead Model: 5076
Implantable Pulse Generator Implant Date: 20200622
Lead Channel Impedance Value: 285 Ohm
Lead Channel Impedance Value: 304 Ohm
Lead Channel Impedance Value: 361 Ohm
Lead Channel Impedance Value: 399 Ohm
Lead Channel Pacing Threshold Amplitude: 1.25 V
Lead Channel Pacing Threshold Pulse Width: 0.4 ms
Lead Channel Sensing Intrinsic Amplitude: 0.75 mV
Lead Channel Sensing Intrinsic Amplitude: 2.875 mV
Lead Channel Sensing Intrinsic Amplitude: 2.875 mV
Lead Channel Setting Pacing Amplitude: 3 V
Lead Channel Setting Pacing Pulse Width: 0.4 ms
Lead Channel Setting Sensing Sensitivity: 1.2 mV

## 2020-07-01 NOTE — Progress Notes (Signed)
Remote pacemaker transmission.   

## 2020-07-10 ENCOUNTER — Other Ambulatory Visit: Payer: Self-pay

## 2020-07-10 ENCOUNTER — Inpatient Hospital Stay: Payer: Medicare PPO

## 2020-07-10 ENCOUNTER — Inpatient Hospital Stay: Payer: Medicare PPO | Attending: Adult Health | Admitting: Adult Health

## 2020-07-10 ENCOUNTER — Encounter: Payer: Self-pay | Admitting: Adult Health

## 2020-07-10 VITALS — BP 139/64 | HR 87 | Temp 97.3°F | Resp 16 | Ht 63.5 in | Wt 147.7 lb

## 2020-07-10 DIAGNOSIS — M818 Other osteoporosis without current pathological fracture: Secondary | ICD-10-CM | POA: Diagnosis not present

## 2020-07-10 DIAGNOSIS — D0512 Intraductal carcinoma in situ of left breast: Secondary | ICD-10-CM | POA: Diagnosis not present

## 2020-07-10 DIAGNOSIS — M810 Age-related osteoporosis without current pathological fracture: Secondary | ICD-10-CM | POA: Insufficient documentation

## 2020-07-10 DIAGNOSIS — I5043 Acute on chronic combined systolic (congestive) and diastolic (congestive) heart failure: Secondary | ICD-10-CM

## 2020-07-10 LAB — COMPREHENSIVE METABOLIC PANEL
ALT: 13 U/L (ref 0–44)
AST: 21 U/L (ref 15–41)
Albumin: 3.6 g/dL (ref 3.5–5.0)
Alkaline Phosphatase: 47 U/L (ref 38–126)
Anion gap: 13 (ref 5–15)
BUN: 66 mg/dL — ABNORMAL HIGH (ref 8–23)
CO2: 22 mmol/L (ref 22–32)
Calcium: 10 mg/dL (ref 8.9–10.3)
Chloride: 100 mmol/L (ref 98–111)
Creatinine, Ser: 3.21 mg/dL (ref 0.44–1.00)
GFR, Estimated: 14 mL/min — ABNORMAL LOW (ref >60)
Glucose, Bld: 179 mg/dL — ABNORMAL HIGH (ref 70–99)
Potassium: 3.5 mmol/L (ref 3.5–5.1)
Sodium: 135 mmol/L (ref 135–145)
Total Bilirubin: 0.5 mg/dL (ref 0.3–1.2)
Total Protein: 8 g/dL (ref 6.5–8.1)

## 2020-07-10 LAB — CBC WITH DIFFERENTIAL/PLATELET
Abs Immature Granulocytes: 0.02 10*3/uL (ref 0.00–0.07)
Basophils Absolute: 0.1 10*3/uL (ref 0.0–0.1)
Basophils Relative: 1 %
Eosinophils Absolute: 0.2 10*3/uL (ref 0.0–0.5)
Eosinophils Relative: 2 %
HCT: 35.9 % — ABNORMAL LOW (ref 36.0–46.0)
Hemoglobin: 11.8 g/dL — ABNORMAL LOW (ref 12.0–15.0)
Immature Granulocytes: 0 %
Lymphocytes Relative: 18 %
Lymphs Abs: 1.3 10*3/uL (ref 0.7–4.0)
MCH: 30.1 pg (ref 26.0–34.0)
MCHC: 32.9 g/dL (ref 30.0–36.0)
MCV: 91.6 fL (ref 80.0–100.0)
Monocytes Absolute: 0.5 10*3/uL (ref 0.1–1.0)
Monocytes Relative: 7 %
Neutro Abs: 5.2 10*3/uL (ref 1.7–7.7)
Neutrophils Relative %: 72 %
Platelets: 188 10*3/uL (ref 150–400)
RBC: 3.92 MIL/uL (ref 3.87–5.11)
RDW: 13.5 % (ref 11.5–15.5)
WBC: 7.2 10*3/uL (ref 4.0–10.5)
nRBC: 0 % (ref 0.0–0.2)

## 2020-07-10 MED ORDER — DENOSUMAB 60 MG/ML ~~LOC~~ SOSY
60.0000 mg | PREFILLED_SYRINGE | Freq: Once | SUBCUTANEOUS | Status: AC
  Administered 2020-07-10: 60 mg via SUBCUTANEOUS

## 2020-07-10 MED ORDER — DENOSUMAB 60 MG/ML ~~LOC~~ SOSY
PREFILLED_SYRINGE | SUBCUTANEOUS | Status: AC
  Filled 2020-07-10: qty 1

## 2020-07-10 NOTE — Progress Notes (Signed)
[place]  Telephone:([phone] Fax:(336) [phone]     ID: Yolanda Huffman DOB: 03/18/1941  MR#: [id number]  MKL#:[id number]  Patient Care Team: Eulas Post, MD as PCP - General (Family Medicine) Buford Dresser, MD as PCP - Cardiology (Cardiology) Donato Heinz, MD as Consulting Physician (Nephrology) Magrinat, Virgie Dad, MD as Consulting Physician (Oncology) Jovita Kussmaul, MD as Consulting Physician (General Surgery) Tracker Mance, Charlestine Massed, NP as Nurse Practitioner (Hematology and Oncology) Mcarthur Rossetti, MD as Consulting Physician (Orthopedic Surgery) Mauri Pole, MD as Consulting Physician (Gastroenterology) Evans Lance, MD as Consulting Physician (Cardiology) Scot Dock, NP OTHER MD:   CHIEF COMPLAINT: Ductal carcinoma in situ   CURRENT TREATMENT: Anastrozole, denosumab/Prolia   INTERVAL HISTORY: Yolanda Huffman returns today for follow-up and treatment of her ductal carcinoma in situ. She was last seen here on 05/28/2019.   She continues on anastrozole.  She tolerates this with no side effects that she is aware of  She also continues on Prolia, every 6 months and tolerates this well.  She has no concerns related to receiving this.  She is due for repeat bone density in 07/2020  Since her last visit here, she underwent a digital diagnostic bilateral mammogram with tomography on 12/19/2019 revealing no evidence of breast malignancy.    REVIEW OF SYSTEMS: Yolanda Huffman is doing well.  She has no new breast concerns, and is overall feeling well.  She has no new issues and a detailed ROS was otherwise non-contributory.     BREAST CANCER HISTORY: From the original intake note:  The patient had routine bilateral screening mammography with tomography at the [facility] 07/06/2016, showing new calcifications in the left breast. The right breast was unremarkable. Unilateral left diagnostic mammography 07/27/2016 found the  breast density to be category B. In the upper inner quadrant of the left breast there was a 0.7 cm group of pleomorphic calcifications.  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  Her subsequent history is as detailed below   PAST MEDICAL HISTORY: Past Medical History:  Diagnosis Date  . Anemia   . Arthritis    gout  . Atrial fibrillation ([place])    prior Sotalol - d/c'd 2/2 increased creatinine; rate control strategy  . CAD (coronary artery disease)    s/p PCI in [place] in 1/09  . Cancer [facility])    left breast  . Chronic combined systolic and diastolic heart failure (HCC)    Echocardiogram 3/12: Mild LVH, EF 69-79%, normal diastolic function, mild AI, mild MR, PASP 44, normal wall motion  . CKD (chronic kidney disease)    sees Dr. Burman Foster - Stage 3 ?  Marland Kitchen Diabetes mellitus   . Hyperlipemia   . Hypertension   . MI, old    2009  . Osteoporosis   . Pacemaker   . Pleural effusion   . Pneumonia   . PUD (peptic ulcer disease) 10/2010   duodenal ulcer    PAST SURGICAL HISTORY: Past Surgical History:  Procedure Laterality Date  . BREAST BIOPSY Left 07/29/2016  . BREAST LUMPECTOMY Left 09/19/2016  . BREAST LUMPECTOMY WITH RADIOACTIVE SEED LOCALIZATION Left 09/19/2016   Procedure: LEFT BREAST LUMPECTOMY WITH RADIOACTIVE SEED LOCALIZATION;  Surgeon: Autumn Messing III, MD;  Location: [place];  Service: General;  Laterality: Left;  . CATARACT EXTRACTION Bilateral    01/12/09 and 01/26/09 both  eyes  . COLONOSCOPY    . CORONARY ANGIOPLASTY  2009  . PACEMAKER INSERTION  07/13/07  . PPM GENERATOR CHANGEOUT N/A 12/17/2018   Procedure: PPM GENERATOR CHANGEOUT;  Surgeon: Evans Lance, MD;  Location: [place] [facility];  Service: Cardiovascular;  Laterality: N/A;  . stent implant  07/13/2007  . TOTAL HIP ARTHROPLASTY Right 07/23/2017   Procedure:  TOTAL HIP ARTHROPLASTY ANTERIOR APPROACH;  Surgeon: Mcarthur Rossetti, MD;  Location: [place];  Service: Orthopedics;  Laterality: Right;    FAMILY HISTORY Family History  Problem Relation Age of Onset  . Heart disease Father   . Heart attack Father   . Clotting disorder Father        blood clot  . Hypertension Father   . Heart disease Mother   . Breast cancer Maternal Aunt 1  . Breast cancer Sister 15  . Breast cancer Other 40  . Colon cancer Neg Hx    The patient's father died at age 41 from a myocardial infarction. The patient's mother died from multiple problems including pleurisy at the age of 33. The patient has 3 brothers, 2 sisters. One sister was diagnosed with breast cancer in her late 52s. The other sister had a "vaginal cancer" (? Cervical cancer). A maternal aunt was diagnosed with breast cancer in her 65s and her daughter, the patient's niece was diagnosed with breast cancer in her 67s.   GYNECOLOGIC HISTORY:  No LMP recorded. Patient is postmenopausal. Menarche age 74, first live birth age 64, the patient is GX P1. She stopped having periods in 1992. She did not take hormone replacement. She took oral contraceptives briefly remotely with no complications.    SOCIAL HISTORY: (Updated 07/18/2018) She is originally from [place]. 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 She is a widow. She lives with her cat (adopted at 50 year old on 08/06/2014), Yolanda Huffman, in a retirement community. Her daughter Yolanda Huffman lives in [place]. She is a Runner, broadcasting/film/video. The patient has no grandchildren. She is not a church attender    ADVANCED DIRECTIVES: The patient's daughter Yolanda Huffman is her healthcare power of attorney. Yolanda Huffman can be reached at [phone] or [phone]   HEALTH MAINTENANCE: Social History   Tobacco Use  . Smoking status: Former Smoker    Packs/day: 2.00    Years: 20.00    Pack years: 40.00    Types: Cigarettes    Quit date: 11/26/1991    Years since  quitting: 28.6  . Smokeless tobacco: Never Used  Vaping Use  . Vaping Use: Never used  Substance Use Topics  . Alcohol use: No  . Drug use: No     Colonoscopy:January 2017/ [facility]  PAP:  Bone density: March 2017 showed a T score of -2.7 (osteoporosis) right femoral neck   Allergies  Allergen Reactions  . Amlodipine Besylate Swelling    Current Outpatient Medications  Medication Sig Dispense Refill  . acetaminophen (TYLENOL) 500 MG tablet Take 500-1,000 mg by mouth daily as needed for moderate pain.    Marland Kitchen anastrozole (ARIMIDEX) 1 MG tablet TAKE ONE TABLET BY MOUTH DAILY 90 tablet 0  . atorvastatin (LIPITOR) 40 MG tablet TAKE ONE TABLET BY MOUTH DAILY 90 tablet 3  . calcium carbonate (TUMS - DOSED IN MG ELEMENTAL CALCIUM) 500 MG chewable tablet Chew 1 tablet by mouth See admin instructions. Tuesday, Thursday, Saturday and Sunday    . Cranberry 500 MG CAPS Take 500 mg by mouth daily with lunch.    Marland Kitchen  denosumab (PROLIA) 60 MG/ML SOSY injection Inject 60 mg into the skin every 6 (six) months.    . Ferrous Sulfate (IRON) 325 (65 Fe) MG TABS Take by mouth.    . furosemide (LASIX) 40 MG tablet TAKE TWO TABLETS BY MOUTH EVERY MORNING AND TAKE ONE TABLET BY MOUTH EVERY EVENING 270 tablet 1  . glucose blood test strip 1 each by Other route 3 (three) times daily. Use as instructed    . insulin detemir (LEVEMIR FLEXTOUCH) 100 UNIT/ML FlexPen INJECT 12 UNITS UNDER THE SKIN DAILY 15 mL 1  . Insulin Pen Needle (BD PEN NEEDLE NANO U/F) 32G X 4 MM MISC Use to inject Levemir one time daily. Dx Code E11.9 100 each 1  . metolazone (ZAROXOLYN) 2.5 MG tablet TAKE 1 TABLET BY MOUTH DAILY ON MONDAY, WEDNESDAY AND FRIDAY 45 tablet 2  . metoprolol succinate (TOPROL-XL) 25 MG 24 hr tablet TAKE TWO TABLETS BY MOUTH EVERY MORNING AND TAKE ONE TABLET BY MOUTH EVERY EVENING 270 tablet 3  . Multiple Vitamin (MULTIVITAMIN WITH MINERALS) TABS tablet Take 1 tablet by mouth daily. FOR ADULT 50+    . Omega-3 Fatty Acids  (FISH OIL) 1000 MG CAPS Take 1,000 mg by mouth daily.     . pantoprazole (PROTONIX) 40 MG tablet TAKE ONE TABLET BY MOUTH DAILY 90 tablet 3  . potassium chloride SA (KLOR-CON M20) 20 MEQ tablet TAKE TWO TABLETS BY MOUTH TWICE A DAY WITH FOOD AND WATER 360 tablet 2   No current facility-administered medications for this visit.    OBJECTIVE: White woman who appears stated age  12:   07/10/20 0957  BP: 139/64  Pulse: 87  Resp: 16  Temp: (!) 97.3 F (36.3 C)  SpO2: 100%   Wt Readings from Last 3 Encounters:  07/10/20 147 lb 11.2 oz (67 kg)  03/03/20 146 lb (66.2 kg)  02/18/20 153 lb (69.4 kg)   Body mass index is 25.75 kg/m.    ECOG FS:1 - Symptomatic but completely ambulatory GENERAL: Patient is a well appearing female in no acute distress HEENT:  Sclerae anicteric.  Mask in place.  Neck is supple.  NODES:  No cervical, supraclavicular, or axillary lymphadenopathy palpated.  BREAST EXAM:  Deferred. LUNGS:  Clear to auscultation bilaterally.  No wheezes or rhonchi. HEART:  Regular rate and rhythm. No murmur appreciated. ABDOMEN:  Soft, nontender.  Positive, normoactive bowel sounds. No organomegaly palpated. MSK:  No focal spinal tenderness to palpation. Full range of motion bilaterally in the upper extremities. EXTREMITIES:  No peripheral edema.   SKIN:  Clear with no obvious rashes or skin changes. No nail dyscrasia. NEURO:  Nonfocal. Well oriented.  Appropriate affect.     LAB RESULTS:  CMP     Component Value Date/Time   NA 136 01/08/2020 1102   NA 137 05/28/2019 0000   NA 134 (L) 04/18/2017 0950   K 3.6 01/08/2020 1102   K 3.4 (L) 04/18/2017 0950   CL 100 01/08/2020 1102   CO2 23 01/08/2020 1102   CO2 26 04/18/2017 0950   GLUCOSE 177 (H) 01/08/2020 1102   GLUCOSE 139 04/18/2017 0950   BUN 52 (H) 01/08/2020 1102   BUN 50 (A) 05/28/2019 0000   BUN 53.9 (H) 04/18/2017 0950   CREATININE 3.02 (HH) 01/08/2020 1102   CREATININE 3.04 (HH) 11/22/2018 1022    CREATININE 2.4 (H) 04/18/2017 0950   CALCIUM 9.7 01/08/2020 1102   CALCIUM 9.1 04/18/2017 0950   PROT 7.3 01/08/2020 1102  PROT 7.4 04/18/2017 0950   ALBUMIN 3.4 (L) 01/08/2020 1102   ALBUMIN 3.4 (L) 04/18/2017 0950   AST 19 01/08/2020 1102   AST 20 11/22/2018 1022   AST 28 04/18/2017 0950   ALT 11 01/08/2020 1102   ALT 11 11/22/2018 1022   ALT 18 04/18/2017 0950   ALKPHOS 39 01/08/2020 1102   ALKPHOS 42 04/18/2017 0950   BILITOT 0.5 01/08/2020 1102   BILITOT 0.4 11/22/2018 1022   BILITOT 0.55 04/18/2017 0950   GFRNONAA 14 (L) 01/08/2020 1102   GFRNONAA 14 (L) 11/22/2018 1022   GFRAA 16 (L) 01/08/2020 1102   GFRAA 16 (L) 11/22/2018 1022    INo results found for: SPEP, UPEP  Lab Results  Component Value Date   WBC 7.2 07/10/2020   NEUTROABS 5.2 07/10/2020   HGB 11.8 (L) 07/10/2020   HCT 35.9 (L) 07/10/2020   MCV 91.6 07/10/2020   PLT 188 07/10/2020      Chemistry      Component Value Date/Time   NA 136 01/08/2020 1102   NA 137 05/28/2019 0000   NA 134 (L) 04/18/2017 0950   K 3.6 01/08/2020 1102   K 3.4 (L) 04/18/2017 0950   CL 100 01/08/2020 1102   CO2 23 01/08/2020 1102   CO2 26 04/18/2017 0950   BUN 52 (H) 01/08/2020 1102   BUN 50 (A) 05/28/2019 0000   BUN 53.9 (H) 04/18/2017 0950   CREATININE 3.02 (HH) 01/08/2020 1102   CREATININE 3.04 (HH) 11/22/2018 1022   CREATININE 2.4 (H) 04/18/2017 0950   GLU 124 05/28/2019 0000      Component Value Date/Time   CALCIUM 9.7 01/08/2020 1102   CALCIUM 9.1 04/18/2017 0950   ALKPHOS 39 01/08/2020 1102   ALKPHOS 42 04/18/2017 0950   AST 19 01/08/2020 1102   AST 20 11/22/2018 1022   AST 28 04/18/2017 0950   ALT 11 01/08/2020 1102   ALT 11 11/22/2018 1022   ALT 18 04/18/2017 0950   BILITOT 0.5 01/08/2020 1102   BILITOT 0.4 11/22/2018 1022   BILITOT 0.55 04/18/2017 0950       No results found for: LABCA2  No components found for: WFUXN235  No results for input(s): INR in the last 168 hours.  Urinalysis     Component Value Date/Time   COLORURINE YELLOW 08/09/2018 2130   APPEARANCEUR CLEAR 08/09/2018 2130   LABSPEC 1.008 08/09/2018 2130   PHURINE 5.0 08/09/2018 2130   GLUCOSEU NEGATIVE 08/09/2018 2130   GLUCOSEU NEGATIVE 07/01/2011 1203   HGBUR SMALL (A) 08/09/2018 2130   BILIRUBINUR NEGATIVE 08/09/2018 2130   BILIRUBINUR neg 05/02/2013 [place] 08/09/2018 2130   PROTEINUR NEGATIVE 08/09/2018 2130   UROBILINOGEN 0.2 05/02/2013 1215   UROBILINOGEN 0.2 07/01/2011 1203   NITRITE NEGATIVE 08/09/2018 2130   LEUKOCYTESUR SMALL (A) 08/09/2018 2130     STUDIES: CUP PACEART REMOTE DEVICE CHECK  Result Date: 06/17/2020 Scheduled remote reviewed. Normal device function.  1 NSVT lasting 10 beats w/ rate 170's bpm History of A. Fib on metoprolol. No OAC - history GI bleed. Next remote 91 days. HB    ELIGIBLE FOR AVAILABLE RESEARCH PROTOCOL: No  ASSESSMENT: 80 y.o. [place] woman status post left breast upper inner quadrant biopsy 07/29/2016 for ductal carcinoma in situ, grade 2, estrogen and progesterone receptor positive  (1) Genetics testing 08/23/2016 through the Multi-Gene Panel offered by [facility] found no deleterious mutations in ALK, APC, ATM, AXIN2,BAP1,  BARD1, BLM, BMPR1A, BRCA1, BRCA2, BRIP1, CASR, CDC73,  CDH1, CDK4, CDKN1B, CDKN1C, CDKN2A (p14ARF), CDKN2A (p16INK4a), CEBPA, CHEK2, DICER1, CIS3L2, EGFR (c.2369C>T, p.Thr790Met variant only), EPCAM (Deletion/duplication testing only), FH, FLCN, GATA2, GPC3, GREM1 (Promoter region deletion/duplication testing only), HOXB13 (c.251G>A, p.Gly84Glu), HRAS, KIT, MAX, MEN1, MET, MITF (c.952G>A, p.Glu318Lys variant only), MLH1, MSH2, MSH6, MUTYH, NBN, NF1, NF2, PALB2, PDGFRA, PHOX2B, PMS2, POLD1, POLE, POT1, PRKAR1A, PTCH1, PTEN, RAD50, RAD51C, RAD51D, RB1, RECQL4, RET, RUNX1, SDHAF2, SDHA (sequence changes only), SDHB, SDHC, SDHD, SMAD4, SMARCA4, SMARCB1, SMARCE1, STK11, SUFU, TERT, TERT, TMEM127, TP53, TSC1, TSC2, VHL, WRN and  WT1  (2) considered the COMET trial: patient declines  (3) left lumpectomy 09/19/2016 found ductal carcinoma in situ, grade 2, measuring 0.6 cm, with negative margins.  (4) advised against adjuvant radiation since there would be no survival advantage in this setting and the patient's pacemaker would have to be moved  (5) anastrozole started 09/27/2016  (a) bone density 08/25/2015 found a T score of -2.7, consistent with osteoporosis   (b) denosumab/Prolia started 10/25/2016, repeated every 24 weeks  (c) repeat bone density 08/16/2018 shows a T score of -2.9  (4) iron deficiency anemia  (a) status post Feraheme 04/30/2018  (b) EGD 07/03/2018 showed candidal esophagitis  (b) Feraheme on 05/28/2019      PLAN: Letta Median as no clinical or radiographic sign of breast cancer recurrence.  She continues on anastrozole with good tolerance.  She will udnergo repeat mammogram in 11/2020 when due.    She receives prolia every 6 months and will continue this.  Her next bone density is due in 07/2020 and I placed orders for this today.   We reviewed health maintenance recommendations.  I recommended she continue with healthy diet and activity.    We will see her back in 6 months for labs and f/u.  She knows to call for any questions that may arise between now and her next appointment.  We are happy to see her sooner if needed.   Total encounter time 20 minutes.Wilber Bihari, NP 07/10/20 10:07 AM Medical Oncology and Hematology [facility] [place], [place] [address] Tel. [phone]    Fax. [phone]    *Total Encounter Time as defined by the Centers for Medicare and Medicaid Services includes, in addition to the face-to-face time of a patient visit (documented in the note above) non-face-to-face time: obtaining and reviewing outside history, ordering and reviewing medications, tests or procedures, care coordination (communications with other health care  professionals or caregivers) and documentation in the medical record.

## 2020-07-13 ENCOUNTER — Other Ambulatory Visit: Payer: Medicare PPO

## 2020-07-13 ENCOUNTER — Telehealth: Payer: Self-pay | Admitting: Adult Health

## 2020-07-13 ENCOUNTER — Ambulatory Visit: Payer: Medicare PPO | Admitting: Adult Health

## 2020-07-13 ENCOUNTER — Ambulatory Visit: Payer: Medicare PPO

## 2020-07-13 NOTE — Telephone Encounter (Signed)
No 1/14 los. No changes made to pt's schedule.

## 2020-07-21 ENCOUNTER — Telehealth: Payer: Self-pay | Admitting: *Deleted

## 2020-07-21 NOTE — Telephone Encounter (Signed)
Pt returned call to this RN with inquiry about the bone density ordered at recent visit with Mendel Ryder- she states she has not heard from anyone.  She states she was told it would be a [place] [facility] [address] " in the basement ".  This RN reviewed order and noted order for [place] [facility] [address].  Per website -phone number given as [phone] for contact.  This RN called and received message that this is a nonworking phone number.  This RN called [facility] to inquire - they are not area of above nor have any phone numbers for this site.  This RN will follow up with [facility]/NP per above.

## 2020-07-21 NOTE — Telephone Encounter (Signed)
This RN called pt per scheduling message stating pt was calling for an appointment.  Obtained answering machine- message left stating inquiry about need for appt ( scheduled for yearly follow up in July) and requested return call for further information.

## 2020-07-22 NOTE — Telephone Encounter (Signed)
Hey Val,  Forwarding this to Dr. Elease Hashimoto.  He is seeing patient in a couple weeks and normally sends her to this location every other February.    Thanks,   Mendel Ryder

## 2020-07-22 NOTE — Telephone Encounter (Signed)
Thanks for the reminder!  We will set up when she comes in.    Darnell Level

## 2020-08-10 ENCOUNTER — Encounter: Payer: Self-pay | Admitting: Family Medicine

## 2020-08-18 ENCOUNTER — Other Ambulatory Visit: Payer: Self-pay

## 2020-08-19 ENCOUNTER — Ambulatory Visit: Payer: Medicare PPO | Admitting: Family Medicine

## 2020-08-19 ENCOUNTER — Encounter: Payer: Self-pay | Admitting: Family Medicine

## 2020-08-19 VITALS — BP 134/74 | HR 96 | Ht 63.5 in | Wt 150.0 lb

## 2020-08-19 DIAGNOSIS — E1122 Type 2 diabetes mellitus with diabetic chronic kidney disease: Secondary | ICD-10-CM | POA: Diagnosis not present

## 2020-08-19 DIAGNOSIS — I129 Hypertensive chronic kidney disease with stage 1 through stage 4 chronic kidney disease, or unspecified chronic kidney disease: Secondary | ICD-10-CM | POA: Diagnosis not present

## 2020-08-19 DIAGNOSIS — I10 Essential (primary) hypertension: Secondary | ICD-10-CM

## 2020-08-19 DIAGNOSIS — N184 Chronic kidney disease, stage 4 (severe): Secondary | ICD-10-CM

## 2020-08-19 DIAGNOSIS — E785 Hyperlipidemia, unspecified: Secondary | ICD-10-CM | POA: Diagnosis not present

## 2020-08-19 LAB — POCT GLYCOSYLATED HEMOGLOBIN (HGB A1C): Hemoglobin A1C: 6.6 % — AB (ref 4.0–5.6)

## 2020-08-19 NOTE — Patient Instructions (Signed)
Diabetes Mellitus and Foot Care Foot care is an important part of your health, especially when you have diabetes. Diabetes may cause you to have problems because of poor blood flow (circulation) to your feet and legs, which can cause your skin to:  Become thinner and drier.  Break more easily.  Heal more slowly.  Peel and crack. You may also have nerve damage (neuropathy) in your legs and feet, causing decreased feeling in them. This means that you may not notice minor injuries to your feet that could lead to more serious problems. Noticing and addressing any potential problems early is the best way to prevent future foot problems. How to care for your feet Foot hygiene  Wash your feet daily with warm water and mild soap. Do not use hot water. Then, pat your feet and the areas between your toes until they are completely dry. Do not soak your feet as this can dry your skin.  Trim your toenails straight across. Do not dig under them or around the cuticle. File the edges of your nails with an emery board or nail file.  Apply a moisturizing lotion or petroleum jelly to the skin on your feet and to dry, brittle toenails. Use lotion that does not contain alcohol and is unscented. Do not apply lotion between your toes.   Shoes and socks  Wear clean socks or stockings every day. Make sure they are not too tight. Do not wear knee-high stockings since they may decrease blood flow to your legs.  Wear shoes that fit properly and have enough cushioning. Always look in your shoes before you put them on to be sure there are no objects inside.  To break in new shoes, wear them for just a few hours a day. This prevents injuries on your feet. Wounds, scrapes, corns, and calluses  Check your feet daily for blisters, cuts, bruises, sores, and redness. If you cannot see the bottom of your feet, use a mirror or ask someone for help.  Do not cut corns or calluses or try to remove them with medicine.  If you  find a minor scrape, cut, or break in the skin on your feet, keep it and the skin around it clean and dry. You may clean these areas with mild soap and water. Do not clean the area with peroxide, alcohol, or iodine.  If you have a wound, scrape, corn, or callus on your foot, look at it several times a day to make sure it is healing and not infected. Check for: ? Redness, swelling, or pain. ? Fluid or blood. ? Warmth. ? Pus or a bad smell.   General tips  Do not cross your legs. This may decrease blood flow to your feet.  Do not use heating pads or hot water bottles on your feet. They may burn your skin. If you have lost feeling in your feet or legs, you may not know this is happening until it is too late.  Protect your feet from hot and cold by wearing shoes, such as at the beach or on hot pavement.  Schedule a complete foot exam at least once a year (annually) or more often if you have foot problems. Report any cuts, sores, or bruises to your health care provider immediately. Where to find more information  American Diabetes Association: www.diabetes.org  Association of Diabetes Care & Education Specialists: www.diabeteseducator.org Contact a health care provider if:  You have a medical condition that increases your risk of infection and   you have any cuts, sores, or bruises on your feet.  You have an injury that is not healing.  You have redness on your legs or feet.  You feel burning or tingling in your legs or feet.  You have pain or cramps in your legs and feet.  Your legs or feet are numb.  Your feet always feel cold.  You have pain around any toenails. Get help right away if:  You have a wound, scrape, corn, or callus on your foot and: ? You have pain, swelling, or redness that gets worse. ? You have fluid or blood coming from the wound, scrape, corn, or callus. ? Your wound, scrape, corn, or callus feels warm to the touch. ? You have pus or a bad smell coming from  the wound, scrape, corn, or callus. ? You have a fever. ? You have a red line going up your leg. Summary  Check your feet every day for blisters, cuts, bruises, sores, and redness.  Apply a moisturizing lotion or petroleum jelly to the skin on your feet and to dry, brittle toenails.  Wear shoes that fit properly and have enough cushioning.  If you have foot problems, report any cuts, sores, or bruises to your health care provider immediately.  Schedule a complete foot exam at least once a year (annually) or more often if you have foot problems. This information is not intended to replace advice given to you by your health care provider. Make sure you discuss any questions you have with your health care provider. Document Revised: 01/02/2020 Document Reviewed: 01/02/2020 Elsevier Patient Education  2021 [employer] Inc.  

## 2020-08-19 NOTE — Progress Notes (Signed)
Established Patient Office Visit  Subjective:  Patient ID: Yolanda Huffman, female    DOB: 1941-06-06  Age: 80 y.o. MRN: [id number]  CC:  Chief Complaint  Patient presents with  . Diabetes  . Hypertension    HPI Yolanda Huffman presents for routine medical follow-up.  She has history of chronic kidney disease stage IV, type 2 diabetes, hypertension, history of combined systolic and diastolic heart failure, history of atrial fibrillation, osteoporosis.  She is followed regularly by nephrology.  She states she generally feels well.  Is had excellent appetite.  Blood sugar stable.  Medications reviewed and include potassium, Protonix, metoprolol, metolazone, Levemir insulin, Lasix, Prolia injections every 6 months, Lipitor, and anastrozole.  She has not had lipid panel in over a year.  She states she is compliant with all medications.  Denies any recent chest pains.  No peripheral edema.  Weight stable.  Denies any recent hypoglycemic symptoms.  No recent falls.  Past Medical History:  Diagnosis Date  . Anemia   . Arthritis    gout  . Atrial fibrillation ([place])    prior Sotalol - d/c'd 2/2 increased creatinine; rate control strategy  . CAD (coronary artery disease)    s/p PCI in [place] in 1/09  . Cancer [facility])    left breast  . Chronic combined systolic and diastolic heart failure (HCC)    Echocardiogram 3/12: Mild LVH, EF 47-65%, normal diastolic function, mild AI, mild MR, PASP 44, normal wall motion  . CKD (chronic kidney disease)    sees Dr. Burman Foster - Stage 3 ?  Marland Kitchen Diabetes mellitus   . Hyperlipemia   . Hypertension   . MI, old    2009  . Osteoporosis   . Pacemaker   . Pleural effusion   . Pneumonia   . PUD (peptic ulcer disease) 10/2010   duodenal ulcer    Past Surgical History:  Procedure Laterality Date  . BREAST BIOPSY Left 07/29/2016  . BREAST LUMPECTOMY Left 09/19/2016  . BREAST LUMPECTOMY WITH RADIOACTIVE SEED LOCALIZATION Left 09/19/2016   Procedure:  LEFT BREAST LUMPECTOMY WITH RADIOACTIVE SEED LOCALIZATION;  Surgeon: Autumn Messing III, MD;  Location: [facility];  Service: General;  Laterality: Left;  . CATARACT EXTRACTION Bilateral    01/12/09 and 01/26/09 both eyes  . COLONOSCOPY    . CORONARY ANGIOPLASTY  2009  . PACEMAKER INSERTION  07/13/07  . PPM GENERATOR CHANGEOUT N/A 12/17/2018   Procedure: PPM GENERATOR CHANGEOUT;  Surgeon: Evans Lance, MD;  Location: [facility];  Service: Cardiovascular;  Laterality: N/A;  . stent implant  07/13/2007  . TOTAL HIP ARTHROPLASTY Right 07/23/2017   Procedure: TOTAL HIP ARTHROPLASTY ANTERIOR APPROACH;  Surgeon: Mcarthur Rossetti, MD;  Location: [facility];  Service: Orthopedics;  Laterality: Right;    Family History  Problem Relation Age of Onset  . Heart disease Father   . Heart attack Father   . Clotting disorder Father        blood clot  . Hypertension Father   . Heart disease Mother   . Breast cancer Maternal Aunt 42  . Breast cancer Sister 42  . Breast cancer Other 40  . Colon cancer Neg Hx     Social History   Socioeconomic History  . Marital status: Widowed    Spouse name: Not on file  . Number of children: 1  . Years of education: Not on file  . Highest education level: Not on file  Occupational History  .  Occupation: retired Theatre stage manager: RETIRED  Tobacco Use  . Smoking status: Former Smoker    Packs/day: 2.00    Years: 20.00    Pack years: 40.00    Types: Cigarettes    Quit date: 11/26/1991    Years since quitting: 28.7  . Smokeless tobacco: Never Used  Vaping Use  . Vaping Use: Never used  Substance and Sexual Activity  . Alcohol use: No  . Drug use: No  . Sexual activity: Not on file  Other Topics Concern  . Not on file  Social History Narrative   Widowed   [place] [address] resides in senior apartment    1 daughter local; no grandchildren      Social Determinants of Health   Financial Resource Strain: Not on file  Food Insecurity: Not on file   Transportation Needs: Not on file  Physical Activity: Not on file  Stress: Not on file  Social Connections: Not on file  Intimate Partner Violence: Not on file    Outpatient Medications Prior to Visit  Medication Sig Dispense Refill  . acetaminophen (TYLENOL) 500 MG tablet Take 500-1,000 mg by mouth daily as needed for moderate pain.    Marland Kitchen anastrozole (ARIMIDEX) 1 MG tablet TAKE ONE TABLET BY MOUTH DAILY 90 tablet 0  . atorvastatin (LIPITOR) 40 MG tablet TAKE ONE TABLET BY MOUTH DAILY 90 tablet 3  . calcium carbonate (TUMS - DOSED IN MG ELEMENTAL CALCIUM) 500 MG chewable tablet Chew 1 tablet by mouth See admin instructions. Tuesday, Thursday, Saturday and Sunday    . Cranberry 500 MG CAPS Take 500 mg by mouth daily with lunch.    . denosumab (PROLIA) 60 MG/ML SOSY injection Inject 60 mg into the skin every 6 (six) months.    . Ferrous Sulfate (IRON) 325 (65 Fe) MG TABS Take by mouth.    . furosemide (LASIX) 40 MG tablet TAKE TWO TABLETS BY MOUTH EVERY MORNING AND TAKE ONE TABLET BY MOUTH EVERY EVENING 270 tablet 1  . glucose blood test strip 1 each by Other route 3 (three) times daily. Use as instructed    . insulin detemir (LEVEMIR FLEXTOUCH) 100 UNIT/ML FlexPen INJECT 12 UNITS UNDER THE SKIN DAILY 15 mL 1  . Insulin Pen Needle (BD PEN NEEDLE NANO U/F) 32G X 4 MM MISC Use to inject Levemir one time daily. Dx Code E11.9 100 each 1  . metolazone (ZAROXOLYN) 2.5 MG tablet TAKE 1 TABLET BY MOUTH DAILY ON MONDAY, WEDNESDAY AND FRIDAY 45 tablet 2  . metoprolol succinate (TOPROL-XL) 25 MG 24 hr tablet TAKE TWO TABLETS BY MOUTH EVERY MORNING AND TAKE ONE TABLET BY MOUTH EVERY EVENING 270 tablet 3  . Multiple Vitamin (MULTIVITAMIN WITH MINERALS) TABS tablet Take 1 tablet by mouth daily. FOR ADULT 50+    . Omega-3 Fatty Acids (FISH OIL) 1000 MG CAPS Take 1,000 mg by mouth daily.     . pantoprazole (PROTONIX) 40 MG tablet TAKE ONE TABLET BY MOUTH DAILY 90 tablet 3  . potassium chloride SA (KLOR-CON  M20) 20 MEQ tablet TAKE TWO TABLETS BY MOUTH TWICE A DAY WITH FOOD AND WATER 360 tablet 2   No facility-administered medications prior to visit.    Allergies  Allergen Reactions  . Amlodipine Besylate Swelling    ROS Review of Systems  Constitutional: Negative for fatigue and unexpected weight change.  Eyes: Negative for visual disturbance.  Respiratory: Negative for cough, chest tightness, shortness of breath and wheezing.   Cardiovascular:  Negative for chest pain, palpitations and leg swelling.  Gastrointestinal: Negative for abdominal pain.  Genitourinary: Negative for dysuria.  Neurological: Negative for dizziness, seizures, syncope, weakness, light-headedness and headaches.      Objective:    Physical Exam Constitutional:      Appearance: She is well-developed and well-nourished.  Eyes:     Pupils: Pupils are equal, round, and reactive to light.  Neck:     Thyroid: No thyromegaly.     Vascular: No JVD.  Cardiovascular:     Rate and Rhythm: Normal rate and regular rhythm.     Heart sounds: No gallop.   Pulmonary:     Effort: Pulmonary effort is normal. No respiratory distress.     Breath sounds: Normal breath sounds. No wheezing or rales.  Musculoskeletal:        General: No edema.     Cervical back: Neck supple.     Right lower leg: No edema.     Left lower leg: No edema.  Neurological:     Mental Status: She is alert.     BP 134/74   Pulse 96   Ht 5' 3.5" (1.613 m)   Wt 150 lb (68 kg)   SpO2 99%   BMI 26.15 kg/m  Wt Readings from Last 3 Encounters:  08/19/20 150 lb (68 kg)  07/10/20 147 lb 11.2 oz (67 kg)  03/03/20 146 lb (66.2 kg)     Health Maintenance Due  Topic Date Due  . TETANUS/TDAP  Never done  . FOOT EXAM  08/12/2020    There are no preventive care reminders to display for this patient.  Lab Results  Component Value Date   TSH 1.55 08/04/2015   Lab Results  Component Value Date   WBC 7.2 07/10/2020   HGB 11.8 (L) 07/10/2020    HCT 35.9 (L) 07/10/2020   MCV 91.6 07/10/2020   PLT 188 07/10/2020   Lab Results  Component Value Date   NA 135 07/10/2020   K 3.5 07/10/2020   CHLORIDE 96 (L) 04/18/2017   CO2 22 07/10/2020   GLUCOSE 179 (H) 07/10/2020   BUN 66 (H) 07/10/2020   CREATININE 3.21 (HH) 07/10/2020   BILITOT 0.5 07/10/2020   ALKPHOS 47 07/10/2020   AST 21 07/10/2020   ALT 13 07/10/2020   PROT 8.0 07/10/2020   ALBUMIN 3.6 07/10/2020   CALCIUM 10.0 07/10/2020   ANIONGAP 13 07/10/2020   EGFR 18 (L) 04/18/2017   GFR 18.12 (L) 08/08/2018   Lab Results  Component Value Date   CHOL 143 04/05/2019   Lab Results  Component Value Date   HDL 59.70 04/05/2019   Lab Results  Component Value Date   LDLCALC 56 04/05/2019   Lab Results  Component Value Date   TRIG 136.0 04/05/2019   Lab Results  Component Value Date   CHOLHDL 2 04/05/2019   Lab Results  Component Value Date   HGBA1C 6.6 (A) 08/19/2020      Assessment & Plan:   #1 type 2 diabetes with associated chronic kidney disease and hypertension.  A1c stable at 6.6%.  Continue current dose of insulin -Reassess in 6 months  #2 hypertension stable and at goal  #3 hyperlipidemia.  Goal LDL less than 70.  Patient on Lipitor. -Recheck lipid and hepatic panel  #4 chronic kidney disease stage IV -Continue close follow-up with nephrology  No orders of the defined types were placed in this encounter.   Follow-up: Return in about 6 months (around  02/16/2021).    Carolann Littler, MD

## 2020-08-20 LAB — HEPATIC FUNCTION PANEL
ALT: 13 U/L (ref 0–35)
AST: 25 U/L (ref 0–37)
Albumin: 4.4 g/dL (ref 3.5–5.2)
Alkaline Phosphatase: 41 U/L (ref 39–117)
Bilirubin, Direct: 0.1 mg/dL (ref 0.0–0.3)
Total Bilirubin: 0.4 mg/dL (ref 0.2–1.2)
Total Protein: 7.6 g/dL (ref 6.0–8.3)

## 2020-08-20 LAB — LIPID PANEL
Cholesterol: 159 mg/dL (ref 0–200)
HDL: 56.2 mg/dL (ref >39.00)
LDL Cholesterol: 71 mg/dL (ref 0–99)
NonHDL: 103.02
Total CHOL/HDL Ratio: 3
Triglycerides: 160 mg/dL — ABNORMAL HIGH (ref 0.0–149.0)
VLDL: 32 mg/dL (ref 0.0–40.0)

## 2020-08-21 ENCOUNTER — Ambulatory Visit: Payer: Medicare PPO | Admitting: Family Medicine

## 2020-08-21 NOTE — Progress Notes (Signed)
Mychart message sent: Liver panel normal.  Lipids stable

## 2020-08-23 ENCOUNTER — Other Ambulatory Visit: Payer: Self-pay | Admitting: Family Medicine

## 2020-08-29 ENCOUNTER — Other Ambulatory Visit: Payer: Self-pay | Admitting: Oncology

## 2020-08-29 ENCOUNTER — Other Ambulatory Visit: Payer: Self-pay | Admitting: Family Medicine

## 2020-09-11 ENCOUNTER — Emergency Department (HOSPITAL_COMMUNITY): Payer: Medicare PPO

## 2020-09-11 ENCOUNTER — Other Ambulatory Visit: Payer: Self-pay

## 2020-09-11 ENCOUNTER — Inpatient Hospital Stay (HOSPITAL_COMMUNITY)
Admission: EM | Admit: 2020-09-11 | Discharge: 2020-09-13 | DRG: 065 | Disposition: A | Discharge status: Hospice | Payer: Medicare PPO | Attending: Internal Medicine | Admitting: Internal Medicine

## 2020-09-11 ENCOUNTER — Encounter (HOSPITAL_COMMUNITY): Payer: Self-pay

## 2020-09-11 DIAGNOSIS — Z8711 Personal history of peptic ulcer disease: Secondary | ICD-10-CM

## 2020-09-11 DIAGNOSIS — Z7189 Other specified counseling: Secondary | ICD-10-CM | POA: Diagnosis not present

## 2020-09-11 DIAGNOSIS — E1165 Type 2 diabetes mellitus with hyperglycemia: Secondary | ICD-10-CM | POA: Diagnosis present

## 2020-09-11 DIAGNOSIS — E785 Hyperlipidemia, unspecified: Secondary | ICD-10-CM | POA: Diagnosis present

## 2020-09-11 DIAGNOSIS — C50912 Malignant neoplasm of unspecified site of left female breast: Secondary | ICD-10-CM | POA: Diagnosis present

## 2020-09-11 DIAGNOSIS — I63513 Cerebral infarction due to unspecified occlusion or stenosis of bilateral middle cerebral arteries: Secondary | ICD-10-CM | POA: Diagnosis not present

## 2020-09-11 DIAGNOSIS — M109 Gout, unspecified: Secondary | ICD-10-CM | POA: Diagnosis present

## 2020-09-11 DIAGNOSIS — I5042 Chronic combined systolic (congestive) and diastolic (congestive) heart failure: Secondary | ICD-10-CM | POA: Diagnosis not present

## 2020-09-11 DIAGNOSIS — Z950 Presence of cardiac pacemaker: Secondary | ICD-10-CM

## 2020-09-11 DIAGNOSIS — Z86010 Personal history of colonic polyps: Secondary | ICD-10-CM

## 2020-09-11 DIAGNOSIS — Z87891 Personal history of nicotine dependence: Secondary | ICD-10-CM

## 2020-09-11 DIAGNOSIS — Z888 Allergy status to other drugs, medicaments and biological substances status: Secondary | ICD-10-CM

## 2020-09-11 DIAGNOSIS — I69351 Hemiplegia and hemiparesis following cerebral infarction affecting right dominant side: Secondary | ICD-10-CM | POA: Diagnosis not present

## 2020-09-11 DIAGNOSIS — Z79899 Other long term (current) drug therapy: Secondary | ICD-10-CM

## 2020-09-11 DIAGNOSIS — I639 Cerebral infarction, unspecified: Secondary | ICD-10-CM | POA: Diagnosis not present

## 2020-09-11 DIAGNOSIS — N184 Chronic kidney disease, stage 4 (severe): Secondary | ICD-10-CM | POA: Diagnosis present

## 2020-09-11 DIAGNOSIS — R29716 NIHSS score 16: Secondary | ICD-10-CM | POA: Diagnosis present

## 2020-09-11 DIAGNOSIS — R32 Unspecified urinary incontinence: Secondary | ICD-10-CM | POA: Diagnosis present

## 2020-09-11 DIAGNOSIS — R531 Weakness: Secondary | ICD-10-CM | POA: Diagnosis not present

## 2020-09-11 DIAGNOSIS — I130 Hypertensive heart and chronic kidney disease with heart failure and stage 1 through stage 4 chronic kidney disease, or unspecified chronic kidney disease: Secondary | ICD-10-CM | POA: Diagnosis not present

## 2020-09-11 DIAGNOSIS — E1122 Type 2 diabetes mellitus with diabetic chronic kidney disease: Secondary | ICD-10-CM | POA: Diagnosis present

## 2020-09-11 DIAGNOSIS — E876 Hypokalemia: Secondary | ICD-10-CM | POA: Diagnosis present

## 2020-09-11 DIAGNOSIS — M810 Age-related osteoporosis without current pathological fracture: Secondary | ICD-10-CM | POA: Diagnosis present

## 2020-09-11 DIAGNOSIS — I10 Essential (primary) hypertension: Secondary | ICD-10-CM | POA: Diagnosis not present

## 2020-09-11 DIAGNOSIS — Z803 Family history of malignant neoplasm of breast: Secondary | ICD-10-CM

## 2020-09-11 DIAGNOSIS — Z66 Do not resuscitate: Secondary | ICD-10-CM | POA: Diagnosis not present

## 2020-09-11 DIAGNOSIS — R4781 Slurred speech: Secondary | ICD-10-CM | POA: Diagnosis not present

## 2020-09-11 DIAGNOSIS — D509 Iron deficiency anemia, unspecified: Secondary | ICD-10-CM | POA: Diagnosis present

## 2020-09-11 DIAGNOSIS — I482 Chronic atrial fibrillation, unspecified: Secondary | ICD-10-CM | POA: Diagnosis not present

## 2020-09-11 DIAGNOSIS — Z20822 Contact with and (suspected) exposure to covid-19: Secondary | ICD-10-CM | POA: Diagnosis not present

## 2020-09-11 DIAGNOSIS — Z8249 Family history of ischemic heart disease and other diseases of the circulatory system: Secondary | ICD-10-CM

## 2020-09-11 DIAGNOSIS — I252 Old myocardial infarction: Secondary | ICD-10-CM

## 2020-09-11 DIAGNOSIS — Z515 Encounter for palliative care: Secondary | ICD-10-CM

## 2020-09-11 DIAGNOSIS — Z794 Long term (current) use of insulin: Secondary | ICD-10-CM

## 2020-09-11 DIAGNOSIS — M1990 Unspecified osteoarthritis, unspecified site: Secondary | ICD-10-CM | POA: Diagnosis present

## 2020-09-11 DIAGNOSIS — I2510 Atherosclerotic heart disease of native coronary artery without angina pectoris: Secondary | ICD-10-CM | POA: Diagnosis present

## 2020-09-11 DIAGNOSIS — R29810 Facial weakness: Secondary | ICD-10-CM | POA: Diagnosis not present

## 2020-09-11 DIAGNOSIS — Z96641 Presence of right artificial hip joint: Secondary | ICD-10-CM | POA: Diagnosis present

## 2020-09-11 DIAGNOSIS — R410 Disorientation, unspecified: Secondary | ICD-10-CM | POA: Diagnosis not present

## 2020-09-11 LAB — RESP PANEL BY RT-PCR (FLU A&B, COVID) ARPGX2
Influenza A by PCR: NEGATIVE
Influenza B by PCR: NEGATIVE
SARS Coronavirus 2 by RT PCR: NEGATIVE

## 2020-09-11 LAB — DIFFERENTIAL
Abs Immature Granulocytes: 0.04 10*3/uL (ref 0.00–0.07)
Basophils Absolute: 0 10*3/uL (ref 0.0–0.1)
Basophils Relative: 0 %
Eosinophils Absolute: 0 10*3/uL (ref 0.0–0.5)
Eosinophils Relative: 0 %
Immature Granulocytes: 0 %
Lymphocytes Relative: 6 %
Lymphs Abs: 0.7 10*3/uL (ref 0.7–4.0)
Monocytes Absolute: 0.5 10*3/uL (ref 0.1–1.0)
Monocytes Relative: 5 %
Neutro Abs: 10.5 10*3/uL — ABNORMAL HIGH (ref 1.7–7.7)
Neutrophils Relative %: 89 %

## 2020-09-11 LAB — I-STAT CHEM 8, ED
BUN: 59 mg/dL — ABNORMAL HIGH (ref 8–23)
Calcium, Ion: 1.14 mmol/L — ABNORMAL LOW (ref 1.15–1.40)
Chloride: 103 mmol/L (ref 98–111)
Creatinine, Ser: 3.2 mg/dL — ABNORMAL HIGH (ref 0.44–1.00)
Glucose, Bld: 177 mg/dL — ABNORMAL HIGH (ref 70–99)
HCT: 38 % (ref 36.0–46.0)
Hemoglobin: 12.9 g/dL (ref 12.0–15.0)
Potassium: 2.9 mmol/L — ABNORMAL LOW (ref 3.5–5.1)
Sodium: 139 mmol/L (ref 135–145)
TCO2: 22 mmol/L (ref 22–32)

## 2020-09-11 LAB — CBC
HCT: 39 % (ref 36.0–46.0)
Hemoglobin: 12.8 g/dL (ref 12.0–15.0)
MCH: 30.4 pg (ref 26.0–34.0)
MCHC: 32.8 g/dL (ref 30.0–36.0)
MCV: 92.6 fL (ref 80.0–100.0)
Platelets: 217 10*3/uL (ref 150–400)
RBC: 4.21 MIL/uL (ref 3.87–5.11)
RDW: 13.4 % (ref 11.5–15.5)
WBC: 11.8 10*3/uL — ABNORMAL HIGH (ref 4.0–10.5)
nRBC: 0 % (ref 0.0–0.2)

## 2020-09-11 LAB — COMPREHENSIVE METABOLIC PANEL
ALT: 61 U/L — ABNORMAL HIGH (ref 0–44)
AST: 74 U/L — ABNORMAL HIGH (ref 15–41)
Albumin: 3.8 g/dL (ref 3.5–5.0)
Alkaline Phosphatase: 47 U/L (ref 38–126)
Anion gap: 14 (ref 5–15)
BUN: 65 mg/dL — ABNORMAL HIGH (ref 8–23)
CO2: 22 mmol/L (ref 22–32)
Calcium: 9.7 mg/dL (ref 8.9–10.3)
Chloride: 101 mmol/L (ref 98–111)
Creatinine, Ser: 3.16 mg/dL — ABNORMAL HIGH (ref 0.44–1.00)
GFR, Estimated: 14 mL/min — ABNORMAL LOW (ref >60)
Glucose, Bld: 181 mg/dL — ABNORMAL HIGH (ref 70–99)
Potassium: 2.9 mmol/L — ABNORMAL LOW (ref 3.5–5.1)
Sodium: 137 mmol/L (ref 135–145)
Total Bilirubin: 1.1 mg/dL (ref 0.3–1.2)
Total Protein: 8.1 g/dL (ref 6.5–8.1)

## 2020-09-11 LAB — ETHANOL: Alcohol, Ethyl (B): <10 mg/dL (ref <10)

## 2020-09-11 LAB — APTT: aPTT: 27 seconds (ref 24–36)

## 2020-09-11 LAB — PROTIME-INR
INR: 1.2 (ref 0.8–1.2)
Prothrombin Time: 14.6 seconds (ref 11.4–15.2)

## 2020-09-11 MED ORDER — GLYCOPYRROLATE 0.2 MG/ML IJ SOLN: 0.2000 mg | INTRAMUSCULAR | Status: DC | PRN

## 2020-09-11 MED ORDER — HALOPERIDOL 0.5 MG PO TABS: 0.5000 mg | ORAL_TABLET | ORAL | Status: DC | PRN

## 2020-09-11 MED ORDER — ONDANSETRON 4 MG PO TBDP: 4.0000 mg | ORAL_TABLET | Freq: Four times a day (QID) | ORAL | Status: DC | PRN

## 2020-09-11 MED ORDER — ACETAMINOPHEN 325 MG PO TABS: 650.0000 mg | ORAL_TABLET | Freq: Four times a day (QID) | ORAL | Status: DC | PRN

## 2020-09-11 MED ORDER — ONDANSETRON HCL 4 MG/2ML IJ SOLN: 4.0000 mg | Freq: Four times a day (QID) | INTRAMUSCULAR | Status: DC | PRN

## 2020-09-11 MED ORDER — GLYCOPYRROLATE 1 MG PO TABS
1.0000 mg | ORAL_TABLET | ORAL | Status: DC | PRN
  Filled 2020-09-11: qty 1

## 2020-09-11 MED ORDER — ACETAMINOPHEN 650 MG RE SUPP
650.0000 mg | Freq: Four times a day (QID) | RECTAL | Status: DC | PRN
  Administered 2020-09-12 – 2020-09-13 (×3): 650 mg via RECTAL
  Filled 2020-09-11 (×3): qty 1

## 2020-09-11 MED ORDER — HALOPERIDOL LACTATE 5 MG/ML IJ SOLN: 0.5000 mg | INTRAMUSCULAR | Status: DC | PRN

## 2020-09-11 MED ORDER — SODIUM CHLORIDE 0.9 % IV SOLN: INTRAVENOUS | Status: DC

## 2020-09-11 MED ORDER — HALOPERIDOL LACTATE 2 MG/ML PO CONC
0.5000 mg | ORAL | Status: DC | PRN
  Filled 2020-09-11: qty 0.3

## 2020-09-11 MED ORDER — FENTANYL CITRATE (PF) 100 MCG/2ML IJ SOLN
25.0000 ug | INTRAMUSCULAR | Status: DC | PRN
  Administered 2020-09-11 – 2020-09-12 (×4): 25 ug via INTRAVENOUS
  Filled 2020-09-11 (×4): qty 2

## 2020-09-11 MED ORDER — GLYCOPYRROLATE 0.2 MG/ML IJ SOLN
0.2000 mg | INTRAMUSCULAR | Status: DC | PRN
  Administered 2020-09-12 (×3): 0.2 mg via INTRAVENOUS
  Filled 2020-09-11 (×3): qty 1

## 2020-09-11 NOTE — Progress Notes (Signed)
Obtained report from ED RN 

## 2020-09-11 NOTE — Progress Notes (Signed)
Patient daughter expresses concern for more care than "Just Comfort" butt understands that full medical treatment is not an option at this time. Daughter states, "If this is all your going to do is just let her sit here than I can take her home where she can die with her cat". Page has been placed to triad MD to relay concerns.

## 2020-09-11 NOTE — Code Documentation (Signed)
Pt is an 80 yr old female with multiple medical problems including a fib and CKD.Marland Kitchen She was last known well yesterday at 1400 when her daughter checked in on her. Today, daughter found her with a right gaze and left sided weakness. EMS was called at that time. Pt arrived at 1227 and was taken to CT 1. Pt was still hemiplegic on left (with arm deficit worse than leg) with a rtward gaze. See flowsheet for complete times and full NIHSS. CTNC done. Per Neurologist, stroke is already too established and likely will have too low ASPECTS score to meet criteria for NIR. Pt outside window for TPA. Pt taken back to room 34. Pt will need q 2 VS and neuro checks. Bedside handoff with Psychologist, counselling. Pt's daughter Alleen Borne is currently on her way to hospital.

## 2020-09-11 NOTE — ED Provider Notes (Signed)
I provided a substantive portion of the care of this patient.  I personally performed the entirety of the history for this encounter.  EKG Interpretation  Date/Time:  Friday September 11 2020 13:02:28 EDT Ventricular Rate:  71 PR Interval:    QRS Duration: 167 QT Interval:  487 QTC Calculation: 526 R Axis:   83 Text Interpretation: Sinus rhythm Atrial premature complex LVH with secondary repolarization abnormality Anterolateral infarct, age indeterminate Prolonged QT interval Otherwise NO significant change from Feb 2020 ecg Confirmed by Octaviano Glow [phone]) on 09/12/2020 2:01:13 PM  Patient lives independently.  Her daughter tried to call her earlier in the day and the patient did not answer.  After several calls and no answer she went to check on her.  She found the patient with a right-sided gaze and weakness of her left arm. Last known well 2pm the day before presentation.  Patient is ill in appearance.  She has her head turned slightly to the right with eyes closed.  She does respond to simple questions.  No respiratory distress.  Lungs are grossly clear.  Heart is regular.  Abdomen soft nondistended.  Patient has right-sided gaze preference.  Left upper extremity limited use.  She can perform grip strength on the right.  I agree with plan of management.   Charlesetta Shanks, MD 09/13/20 2111

## 2020-09-11 NOTE — ED Provider Notes (Signed)
[facility] EMERGENCY DEPARTMENT Provider Note   CSN: [id number] Arrival date & time: 09/11/20  1227    History Code Stroke   Yolanda Huffman is a 80 y.o. female with possible history significant for A. fib, CAD, CHF, CKD presents for evaluation of altered mental status and under code stroke.  Per EMS, LKN 2 PM yesterday.  Had right-sided gaze as well as hemiparesis,  Normaly alert and oriented.  Had urinated and defecated on self.    Level 5 Caveat- AMS, Code Stroke  Collateral from daughter.  Patient normally ANO x3.  Lives on her own, is dependent however does not drive.  Daughter went to call patient later today.  Patient did not answer.  She went over to patient's house and she is in the bed and said "something was wrong."  Daughter subsequently called EMS.  On arrival they noted patient to have right-sided gaze, flaccid left upper extremity.  HPI     Past Medical History:  Diagnosis Date  . Anemia   . Arthritis    gout  . Atrial fibrillation ([place])    prior Sotalol - d/c'd 2/2 increased creatinine; rate control strategy  . CAD (coronary artery disease)    s/p PCI in [place] in 1/09  . Cancer [facility])    left breast  . Chronic combined systolic and diastolic heart failure (HCC)    Echocardiogram 3/12: Mild LVH, EF 35-57%, normal diastolic function, mild AI, mild MR, PASP 44, normal wall motion  . CKD (chronic kidney disease)    sees Dr. Burman Foster - Stage 3 ?  Marland Kitchen Diabetes mellitus   . Hyperlipemia   . Hypertension   . MI, old    2009  . Osteoporosis   . Pacemaker   . Pleural effusion   . Pneumonia   . PUD (peptic ulcer disease) 10/2010   duodenal ulcer    Patient Active Problem List   Diagnosis Date Noted  . Ischemic stroke ([place]) 09/11/2020  . Sinus node dysfunction ([place]) 03/21/2019  . Mitral valve insufficiency 03/01/2019  . 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 Acute CHF (congestive heart failure) ([place]) 08/09/2018  . Lesion of right  lung 08/09/2018  . Gastrointestinal bleeding 07/27/2018  . Acute lower GI bleeding 07/25/2018  . Acute-on-chronic kidney injury ([place]) 07/25/2018  . Supratherapeutic INR 07/25/2018  . Iron deficiency anemia due to chronic blood loss 04/18/2018  . AKI (acute kidney injury) ([place]) 04/17/2018  . Symptomatic anemia 04/17/2018  . Status post right hip replacement 09/04/2017  . Chronic combined systolic and diastolic CHF (congestive heart failure) ([place]) 07/25/2017  . Closed displaced fracture of right femoral neck ([place]) 07/22/2017  . Genetic testing 09/07/2016  . 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 Gout 11/03/2015  . Personal history of colonic polyps 04/15/2015  . Chronic anticoagulation 04/15/2015  . Insulin dependent diabetes mellitus 04/15/2015  . Encounter for anticoagulation discussion and counseling 08/01/2013  . CKD (chronic kidney disease) 06/14/2011  . Pleural effusion 06/10/2011  . DOE (dyspnea on exertion) 06/10/2011  . Edema 11/30/2010  . Duodenal ulcer 11/12/2010  . Pneumonia 11/12/2010  . Anemia associated with acute blood loss 08/12/2010  . BLOOD IN STOOL 08/06/2010  . ATRIAL FIBRILLATION 03/24/2010  . 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 Hyperlipidemia 02/23/2010  . Essential hypertension 02/23/2010  . Coronary atherosclerosis 02/23/2010  . UTI'S, RECURRENT 02/23/2010  . Osteoporosis 02/23/2010  . Cardiac pacemaker in situ  02/23/2010    Past Surgical History:  Procedure Laterality Date  . BREAST BIOPSY Left 07/29/2016  . BREAST LUMPECTOMY Left 09/19/2016  . BREAST LUMPECTOMY WITH RADIOACTIVE SEED LOCALIZATION Left 09/19/2016   Procedure: LEFT BREAST LUMPECTOMY WITH RADIOACTIVE SEED LOCALIZATION;  Surgeon: Autumn Messing III, MD;  Location: [facility];  Service: General;  Laterality: Left;  . CATARACT EXTRACTION Bilateral    01/12/09 and 01/26/09 both eyes  . COLONOSCOPY    . CORONARY ANGIOPLASTY  2009  . PACEMAKER INSERTION   07/13/07  . PPM GENERATOR CHANGEOUT N/A 12/17/2018   Procedure: PPM GENERATOR CHANGEOUT;  Surgeon: Evans Lance, MD;  Location: [facility];  Service: Cardiovascular;  Laterality: N/A;  . stent implant  07/13/2007  . TOTAL HIP ARTHROPLASTY Right 07/23/2017   Procedure: TOTAL HIP ARTHROPLASTY ANTERIOR APPROACH;  Surgeon: Mcarthur Rossetti, MD;  Location: [facility];  Service: Orthopedics;  Laterality: Right;     OB History   No obstetric history on file.     Family History  Problem Relation Age of Onset  . Heart disease Father   . Heart attack Father   . Clotting disorder Father        blood clot  . Hypertension Father   . Heart disease Mother   . Breast cancer Maternal Aunt 65  . Breast cancer Sister 70  . Breast cancer Other 40  . Colon cancer Neg Hx     Social History   Tobacco Use  . Smoking status: Former Smoker    Packs/day: 2.00    Years: 20.00    Pack years: 40.00    Types: Cigarettes    Quit date: 11/26/1991    Years since quitting: 28.8  . Smokeless tobacco: Never Used  Vaping Use  . Vaping Use: Never used  Substance Use Topics  . Alcohol use: No  . Drug use: No    Home Medications Prior to Admission medications   Medication Sig Start Date End Date Taking? Authorizing Provider  acetaminophen (TYLENOL) 500 MG tablet Take 500-1,000 mg by mouth daily as needed for moderate pain.    [provider]  anastrozole (ARIMIDEX) 1 MG tablet TAKE ONE TABLET BY MOUTH DAILY 08/31/20   Magrinat, Virgie Dad, MD  atorvastatin (LIPITOR) 40 MG tablet TAKE ONE TABLET BY MOUTH DAILY 06/01/20   Burchette, Alinda Sierras, MD  calcium carbonate (TUMS - DOSED IN MG ELEMENTAL CALCIUM) 500 MG chewable tablet Chew 1 tablet by mouth See admin instructions. Tuesday, Thursday, Saturday and Sunday    [provider]  Cranberry 500 MG CAPS Take 500 mg by mouth daily with lunch.    [provider]  denosumab (PROLIA) 60 MG/ML SOSY injection Inject 60 mg into the skin  every 6 (six) months.    [provider]  Ferrous Sulfate (IRON) 325 (65 Fe) MG TABS Take by mouth.    [provider]  furosemide (LASIX) 40 MG tablet TAKE TWO TABLETS BY MOUTH EVERY MORNING AND TAKE ONE TABLET BY MOUTH EVERY EVENING 08/31/20   Burchette, Alinda Sierras, MD  glucose blood test strip 1 each by Other route 3 (three) times daily. Use as instructed    [provider]  insulin detemir (LEVEMIR FLEXTOUCH) 100 UNIT/ML FlexPen INJECT TWELVE UNITS UNDER THE SKIN DAILY 08/24/20   Burchette, Alinda Sierras, MD  Insulin Pen Needle (BD PEN NEEDLE NANO U/F) 32G X 4 MM MISC Use to inject Levemir one time daily. Dx Code E11.9 09/18/18   Burchette,  Alinda Sierras, MD  metolazone (ZAROXOLYN) 2.5 MG tablet TAKE 1 TABLET BY MOUTH DAILY ON Adelfa Koh, [facility] AND FRIDAY 04/08/20   Evans Lance, MD  metoprolol succinate (TOPROL-XL) 25 MG 24 hr tablet TAKE TWO TABLETS BY MOUTH EVERY MORNING AND TAKE ONE TABLET BY MOUTH EVERY EVENING 06/01/20   Burchette, Alinda Sierras, MD  Multiple Vitamin (MULTIVITAMIN WITH MINERALS) TABS tablet Take 1 tablet by mouth daily. FOR ADULT 50+    [provider]  Omega-3 Fatty Acids (FISH OIL) 1000 MG CAPS Take 1,000 mg by mouth daily.     [provider]  pantoprazole (PROTONIX) 40 MG tablet TAKE ONE TABLET BY MOUTH DAILY 06/01/20   Burchette, Alinda Sierras, MD  potassium chloride SA (KLOR-CON M20) 20 MEQ tablet TAKE TWO TABLETS BY MOUTH TWICE A DAY WITH FOOD AND WATER 04/01/20   Evans Lance, MD    Allergies    Amlodipine besylate  Review of Systems   Review of Systems  Unable to perform ROS: Mental status change    Physical Exam Updated Vital Signs BP (!) 194/75   Pulse 70   Temp 98.6 F (37 C)   Resp (!) 21   Wt 75.3 kg   SpO2 98%   BMI 28.95 kg/m   Physical Exam Vitals and nursing note reviewed.  Constitutional:      General: She is not in acute distress.    Appearance: She is well-developed. She is ill-appearing (Chronically ill  appearing). She is not diaphoretic.  HENT:     Head: Normocephalic and atraumatic.     Nose: Nose normal.     Mouth/Throat:     Mouth: Mucous membranes are dry.  Eyes:     Pupils: Pupils are equal, round, and reactive to light.  Cardiovascular:     Rate and Rhythm: Normal rate and regular rhythm.     Pulses: Normal pulses.     Heart sounds: Normal heart sounds.  Pulmonary:     Effort: Pulmonary effort is normal. No respiratory distress.     Breath sounds: Normal breath sounds.     Comments: Clear bilaterally  Abdominal:     General: Bowel sounds are normal. There is no distension.     Palpations: Abdomen is soft.     Tenderness: There is no abdominal tenderness. There is no guarding or rebound.     Hernia: A hernia is present.     Comments: Large hernia.  Nontender  Musculoskeletal:        General: Normal range of motion.     Cervical back: Normal range of motion and neck supple.     Comments: Nontender  Skin:    General: Skin is warm and dry.     Capillary Refill: Capillary refill takes 2 to 3 seconds.     Comments: No rash.  Neurological:     Mental Status: She is confused.     Cranial Nerves: Cranial nerve deficit present.     Motor: Weakness present.     Comments: Right-sided gaze Left upper extremity weakness, 5/5 right hand grip Able to pull LE to pain Alert to person, date of birth, not year Unable to perform most of Neuro exam    ED Results / Procedures / Treatments   Labs (all labs ordered are listed, but only abnormal results are displayed) Labs Reviewed  CBC - Abnormal; Notable for the following components:      Result Value   WBC 11.8 (*)    All other  components within normal limits  DIFFERENTIAL - Abnormal; Notable for the following components:   Neutro Abs 10.5 (*)    All other components within normal limits  COMPREHENSIVE METABOLIC PANEL - Abnormal; Notable for the following components:   Potassium 2.9 (*)    Glucose, Bld 181 (*)    BUN 65 (*)     Creatinine, Ser 3.16 (*)    AST 74 (*)    ALT 61 (*)    GFR, Estimated 14 (*)    All other components within normal limits  I-STAT CHEM 8, ED - Abnormal; Notable for the following components:   Potassium 2.9 (*)    BUN 59 (*)    Creatinine, Ser 3.20 (*)    Glucose, Bld 177 (*)    Calcium, Ion 1.14 (*)    All other components within normal limits  RESP PANEL BY RT-PCR (FLU A&B, COVID) ARPGX2  ETHANOL  PROTIME-INR  APTT    EKG None  Radiology CT HEAD CODE STROKE WO CONTRAST  Result Date: 09/11/2020 CLINICAL DATA:  Code stroke. EXAM: CT HEAD WITHOUT CONTRAST TECHNIQUE: Contiguous axial images were obtained from the base of the skull through the vertex without intravenous contrast. COMPARISON:  None. FINDINGS: Brain: Large area of hypoattenuation with loss of gray differentiation involving right frontal, parietal, and temporal lobes as well as the insula and basal ganglia. This involves MCA greater than ACA territories. There is also hypoattenuation and loss of gray differentiation left parietooccipital lobes. Hypoattenuation is present in the left cerebellar hemisphere. No acute intracranial hemorrhage. Mass effect is minor at this time. Small chronic left caudate infarct. Vascular: Hyperdensity along the right MCA. Skull: Unremarkable. Sinuses/Orbits: No acute finding. Other: Mastoid air cells are clear. IMPRESSION: Large right MCA territory infarction with some involvement the ACA territory. Moderate left PCA/MCA territory infarction. Large left PICA territory infarction. No acute intracranial hemorrhage. Hyperdense right carotid terminus and MCA. Electronically Signed   By: Macy Mis M.D.   On: 09/11/2020 12:59    Procedures .Critical Care Performed by: Nettie Elm, PA-C Authorized by: Nettie Elm, PA-C   Critical care provider statement:    Critical care time (minutes):  35   Critical care was necessary to treat or prevent imminent or life-threatening  deterioration of the following conditions:  CNS failure or compromise   Critical care was time spent personally by me on the following activities:  Discussions with consultants, evaluation of patient's response to treatment, examination of patient, ordering and performing treatments and interventions, ordering and review of laboratory studies, ordering and review of radiographic studies, pulse oximetry, re-evaluation of patient's condition, obtaining history from patient or surrogate and review of old charts     Medications Ordered in ED Medications  acetaminophen (TYLENOL) tablet 650 mg (has no administration in time range)    Or  acetaminophen (TYLENOL) suppository 650 mg (has no administration in time range)  haloperidol (HALDOL) tablet 0.5 mg (has no administration in time range)    Or  haloperidol (HALDOL) 2 MG/ML solution 0.5 mg (has no administration in time range)    Or  haloperidol lactate (HALDOL) injection 0.5 mg (has no administration in time range)  ondansetron (ZOFRAN-ODT) disintegrating tablet 4 mg (has no administration in time range)    Or  ondansetron (ZOFRAN) injection 4 mg (has no administration in time range)  glycopyrrolate (ROBINUL) tablet 1 mg (has no administration in time range)    Or  glycopyrrolate (ROBINUL) injection 0.2 mg (has no administration in  time range)    Or  glycopyrrolate (ROBINUL) injection 0.2 mg (has no administration in time range)  fentaNYL (SUBLIMAZE) injection 25 mcg (has no administration in time range)    ED Course  I have reviewed the triage vital signs and the nursing notes.  Pertinent labs & imaging results that were available during my care of the patient were reviewed by me and considered in my medical decision making (see chart for details).  Patient arrives under code stroke.  On arrival she did urinate and defecate on herself.  Last known normal 2 PM yesterday.  Patient with right-sided gaze, left upper extremity weakness.  Does  appear confused.  Heart and lungs clear.  Abdomen soft, nontender however large hernia.  Labs, imaging and reassess.  Per NP with Neuro not a TPA, IR candidate  Per daughter, She had spoken with Neurology Dr. Quinn Axe.  Daughter would like to speak with neurology again on prognosis to determine CODE STATUS.  Labs personally reviewed and interpreted:  CBC leukocytosis at 11.8 Chem 8 potassium 29, creatinine 3.2 similar to prior Metabolic panel, hypokalemia 2.9, creatinine 3.13, elevated LFTs COVID pending   Per Neurology MD Dr. Quinn Axe, Neuro to admit. No acute intervention at this time needed.    Clinical Course as of 09/11/20 1502  Fri Sep 11, 2020  1306 CT HEAD CODE STROKE WO CONTRAST Large right MCA territory infarction with some involvement the ACA territory. Moderate left PCA/MCA territory infarction. Large left PICA territory infarction. [BH]    Clinical Course User Index [BH] Henderly, Britni A, PA-C    Patient seen and evaluated by attending, Dr. Vallery Ridge who agrees above treatment, plan and disposition. MDM Rules/Calculators/A&P                           Final Clinical Impression(s) / ED Diagnoses Final diagnoses:  Acute ischemic stroke [facility])    Rx / DC Orders ED Discharge Orders    None       Henderly, Britni A, PA-C 09/11/20 1503    Charlesetta Shanks, MD 09/13/20 2112

## 2020-09-11 NOTE — ED Triage Notes (Signed)
Pt BIB [facility] from home c/o code stroke. [place] was 2pm yesterday. Pt's daughter checked on her today. Pt is normally alert and oriented and independent. Pt had a right gaze and left sided weakness.

## 2020-09-11 NOTE — H&P (Signed)
H&P Neurology Stroke Service And Stroke Code Consultation Note   CC: code stroke  History is obtained from: EMS, daughter, chart  HPI: Yolanda Huffman is a 80 y.o. female with a PMHx of  A fib not on AC 2/2 prior GIB, CAD s/p PCI, MI, IDDM II, CKD IV, HLD, Cs/dHF, left breast cancer, GERD, and anemia. Patient presented to ED as a code stroke due to left sided weakness and right gaze preference. Patient lives alone and is checked on frequently by daughter. Daughter last saw patient in usual state of health on 09/10/20 at 1400 hrs. Daughter reports calling patient this am and patient did not answer the phone. Daughter went to patient's home and found her with left sided weakness and urinary incontinence. Daughter called 911.   Patient was taken emergently to CT suite. CT head showed large R MCA infarct, moderate L PCA/MCA infarct, L PICA infarct with L>>R cerebellar infarct. No acute intracranial hemorrhage. Hyperdense right carotid terminus and MCA. She was not a tPA candidate 2/2 presenting outside the window. The degree of hypoattenuation in those areas indicated completed or nearly completed infarct and patient was felt to have suffered stroke soon after she was last seen normal yesterday afternoon. Thus she would not be a candidate for intervention. CTA was not performed because it would not change management. Subsequently I held a family meeting with daughter Chauncey Reading) and patient together. Patient was still conversant and demonstrated she still had capacity for her own decision-making. I explained the severity and extent of her strokes and that for the reasons described above there was nothing we could do to fix them. I explained that her brain is expected to swell and that this would likely not be survivable. Patient and daughter Becky Sax agreed to proceed with comfort care.   LKW: 09/10/20 at 1400 hrs tpa given?: no, outside window IR thrombectomy? No, numerous large strokes in 80 yo female  ROS:  Unable to obtain due to altered mental status.   Past Medical History:  Diagnosis Date   Anemia    Arthritis    gout   Atrial fibrillation (HCC)    prior Sotalol - d/c'd 2/2 increased creatinine; rate control strategy   CAD (coronary artery disease)    s/p PCI in [place] in 1/09   Cancer [facility])    left breast   Chronic combined systolic and diastolic heart failure (HCC)    Echocardiogram 3/12: Mild LVH, EF 44-31%, normal diastolic function, mild AI, mild MR, PASP 44, normal wall motion   CKD (chronic kidney disease)    sees Dr. Burman Foster - Stage 3 ?   Diabetes mellitus    Hyperlipemia    Hypertension    MI, old    2009   Osteoporosis    Pacemaker    Pleural effusion    Pneumonia    PUD (peptic ulcer disease) 10/2010   duodenal ulcer    Family History  Problem Relation Age of Onset   Heart disease Father    Heart attack Father    Clotting disorder Father        blood clot   Hypertension Father    Heart disease Mother    Breast cancer Maternal Aunt 7   Breast cancer Sister 58   Breast cancer Other 89   Colon cancer Neg Hx     Social History:   reports that she quit smoking about 28 years ago. Her smoking use included cigarettes. She has a 40.00 pack-year smoking history.  She has never used smokeless tobacco. She reports that she does not drink alcohol and does not use drugs.  Medications No current facility-administered medications for this encounter.  Current Outpatient Medications:    acetaminophen (TYLENOL) 500 MG tablet, Take 500-1,000 mg by mouth daily as needed for moderate pain., Disp: , Rfl:    anastrozole (ARIMIDEX) 1 MG tablet, TAKE ONE TABLET BY MOUTH DAILY, Disp: 90 tablet, Rfl: 0   atorvastatin (LIPITOR) 40 MG tablet, TAKE ONE TABLET BY MOUTH DAILY, Disp: 90 tablet, Rfl: 3   calcium carbonate (TUMS - DOSED IN MG ELEMENTAL CALCIUM) 500 MG chewable tablet, Chew 1 tablet by mouth See admin instructions. Tuesday, Thursday,  Saturday and Sunday, Disp: , Rfl:    Cranberry 500 MG CAPS, Take 500 mg by mouth daily with lunch., Disp: , Rfl:    denosumab (PROLIA) 60 MG/ML SOSY injection, Inject 60 mg into the skin every 6 (six) months., Disp: , Rfl:    Ferrous Sulfate (IRON) 325 (65 Fe) MG TABS, Take by mouth., Disp: , Rfl:    furosemide (LASIX) 40 MG tablet, TAKE TWO TABLETS BY MOUTH EVERY MORNING AND TAKE ONE TABLET BY MOUTH EVERY EVENING, Disp: 270 tablet, Rfl: 1   glucose blood test strip, 1 each by Other route 3 (three) times daily. Use as instructed, Disp: , Rfl:    insulin detemir (LEVEMIR FLEXTOUCH) 100 UNIT/ML FlexPen, INJECT TWELVE UNITS UNDER THE SKIN DAILY, Disp: 45 mL, Rfl: 3   Insulin Pen Needle (BD PEN NEEDLE NANO U/F) 32G X 4 MM MISC, Use to inject Levemir one time daily. Dx Code E11.9, Disp: 100 each, Rfl: 1   metolazone (ZAROXOLYN) 2.5 MG tablet, TAKE 1 TABLET BY MOUTH DAILY ON MONDAY, WEDNESDAY AND FRIDAY, Disp: 45 tablet, Rfl: 2   metoprolol succinate (TOPROL-XL) 25 MG 24 hr tablet, TAKE TWO TABLETS BY MOUTH EVERY MORNING AND TAKE ONE TABLET BY MOUTH EVERY EVENING, Disp: 270 tablet, Rfl: 3   Multiple Vitamin (MULTIVITAMIN WITH MINERALS) TABS tablet, Take 1 tablet by mouth daily. FOR ADULT 50+, Disp: , Rfl:    Omega-3 Fatty Acids (FISH OIL) 1000 MG CAPS, Take 1,000 mg by mouth daily. , Disp: , Rfl:    pantoprazole (PROTONIX) 40 MG tablet, TAKE ONE TABLET BY MOUTH DAILY, Disp: 90 tablet, Rfl: 3   potassium chloride SA (KLOR-CON M20) 20 MEQ tablet, TAKE TWO TABLETS BY MOUTH TWICE A DAY WITH FOOD AND WATER, Disp: 360 tablet, Rfl: 2   Exam: Current vital signs: Wt 75.3 kg    BMI 28.95 kg/m  Vital signs in last 24 hours: Weight:  [75.3 kg] 75.3 kg (03/18 1200)  GENERAL: Awake, alert in NAD HEENT: - Normocephalic and atraumatic, dry mm, no lymphadenopathy LUNGS - slight abdominal breathing but in no distress CV - RRR on tele ABDOMEN - . Large hernia noted. soft, nontender Ext: warm, well  perfused Psych: calm, cooperative  NEURO:  Mental Status: oriented to self, age.  Speech/Language: speech with slight dysarthria and expressive aphasia. Unable to name a pen, ring, or mask. Able to speak in a full sentence with pausing of speech during expression. Repeats today is a sunny day with slowing noted and pausing between words. Bradyphrenia noted.  Cranial Nerves:  II: PERRL 34mm/brisk. visual fields full. Right gaze preference eventually able to overcome.  III, IV, VI: right gaze preference. Can be overcome after initial exam.  V: sensation is intact and symmetrical to face. No blink to threat on the left.   VII:  Smile is asymmetrical with left droop.  VIII:hearing intact to voice IX, X: palate elevation is symmetric. Phonation normal.  XI: normal sternocleidomastoid and trapezius muscle strength YJE:HUDJSH is symmetrical without fasciculations.   Motor: RUE 4-/5 to triceps/biceps/grips. LUE is flaccid. RLE 4-/5 to dorsi/plantar flexion. LLE can lift off bed slightly, but hits bed. Some effort against gravity. 3/5 dorsi/plantar flexion.  Tone is flaccid LUE. Bulk is normal.  Sensation- asymmetric. Can not feel light touch on the LUE/LLE. Extinction present to light touch on left to DSS.  Coordination: intact FNF RUE. Can not perform on LUE. Can not perform or does not comprehend HKS bilaterally.    1a Level of Conscious: 0 1b LOC Questions: 1 1c LOC Commands: 0 2 Best Gaze: 1 3 Visual: 0 4 Facial Palsy: 1 5a Motor Arm - left: 4 5b Motor Arm - Right: 0 6a Motor Leg - Left: 2 6b Motor Leg - Right: 2 7 Limb Ataxia: 0 8 Sensory: 2 9 Best Language: 1 10 Dysarthria: 1 11 Extinct. and Inatten: 1 TOTAL: 16   Labs I have reviewed labs in epic and the results pertinent to this consultation are: no labs returned at time of note.   CBC    Component Value Date/Time   WBC 7.2 07/10/2020 0934   RBC 3.92 07/10/2020 0934   HGB 11.8 (L) 07/10/2020 0934   HGB 11.4 (L) 01/08/2020  1102   HGB 12.4 04/18/2017 0950   HCT 35.9 (L) 07/10/2020 0934   HCT 36.9 04/18/2017 0950   PLT 188 07/10/2020 0934   PLT 179 01/08/2020 1102   PLT 181 04/18/2017 0950   MCV 91.6 07/10/2020 0934   MCV 88.6 04/18/2017 0950   MCH 30.1 07/10/2020 0934   MCHC 32.9 07/10/2020 0934   RDW 13.5 07/10/2020 0934   RDW 15.0 (H) 04/18/2017 0950   LYMPHSABS 1.3 07/10/2020 0934   LYMPHSABS 1.3 04/18/2017 0950   MONOABS 0.5 07/10/2020 0934   MONOABS 0.6 04/18/2017 0950   EOSABS 0.2 07/10/2020 0934   EOSABS 0.1 04/18/2017 0950   BASOSABS 0.1 07/10/2020 0934   BASOSABS 0.1 04/18/2017 0950    CMP     Component Value Date/Time   NA 135 07/10/2020 0934   NA 137 05/28/2019 0000   NA 134 (L) 04/18/2017 0950   K 3.5 07/10/2020 0934   K 3.4 (L) 04/18/2017 0950   CL 100 07/10/2020 0934   CO2 22 07/10/2020 0934   CO2 26 04/18/2017 0950   GLUCOSE 179 (H) 07/10/2020 0934   GLUCOSE 139 04/18/2017 0950   BUN 66 (H) 07/10/2020 0934   BUN 50 (A) 05/28/2019 0000   BUN 53.9 (H) 04/18/2017 0950   CREATININE 3.21 (HH) 07/10/2020 0934   CREATININE 3.04 (HH) 11/22/2018 1022   CREATININE 2.4 (H) 04/18/2017 0950   CALCIUM 10.0 07/10/2020 0934   CALCIUM 9.1 04/18/2017 0950   PROT 7.6 08/19/2020 1445   PROT 7.4 04/18/2017 0950   ALBUMIN 4.4 08/19/2020 1445   ALBUMIN 3.4 (L) 04/18/2017 0950   AST 25 08/19/2020 1445   AST 20 11/22/2018 1022   AST 28 04/18/2017 0950   ALT 13 08/19/2020 1445   ALT 11 11/22/2018 1022   ALT 18 04/18/2017 0950   ALKPHOS 41 08/19/2020 1445   ALKPHOS 42 04/18/2017 0950   BILITOT 0.4 08/19/2020 1445   BILITOT 0.4 11/22/2018 1022   BILITOT 0.55 04/18/2017 0950   GFRNONAA 14 (L) 07/10/2020 0934   GFRNONAA 14 (L) 11/22/2018 1022   GFRAA  16 (L) 01/08/2020 1102   GFRAA 16 (L) 11/22/2018 1022    Lipid Panel     Component Value Date/Time   CHOL 159 08/19/2020 1445   TRIG 160.0 (H) 08/19/2020 1445   HDL 56.20 08/19/2020 1445   CHOLHDL 3 08/19/2020 1445   VLDL 32.0  08/19/2020 1445   LDLCALC 71 08/19/2020 1445   LDLDIRECT 89.2 06/03/2010 0946     Imaging MD reviewed the images obtained  CT head large right MCA territory infarction with some involvement the ACA territory with moderate left PCA/MCA territory infarction and large left PICA territory infarction. No acute intracranial hemorrhage. Hyperdense right carotid terminus and MCA.  A/P: 80 yo woman presenting with multiple large territory completed ischemic strokes and NIHSS 16 with LKW yesterday @ 1400. Outside window for tPA and not a candidate for intervention 2/2 large amount of completed infarct on CTH. CTA was not performed bc it would not change mgmt. See HPI for summary of [place] discussion, patient and daughter HCPOA agreed to proceed with comfort care measures only.  - Admit to stroke service, comfort care measures only - DNAR   Su Monks, MD [facility] [phone]  If 7pm- 7am, please page neurology on call as listed in [facility].   Addendum: Around 1700 on 09/11/20 radiologist determined that there was some artifactual hypodensity on multiple patient's head CT images on CT scanner 1. The L PCA/MCA infarct (moderate) described above was determined to be potentially artifactual. This does not change her prognosis given multiple other large acute infarcts particularly R MCA and L cerebellum and does not affect the contents and outcome of the GOC discussion Dr. Quinn Axe had earlier with patient and daughter during which they agreed to proceed with comfort care measures only.

## 2020-09-12 ENCOUNTER — Encounter: Payer: Self-pay | Admitting: Family Medicine

## 2020-09-12 DIAGNOSIS — I639 Cerebral infarction, unspecified: Secondary | ICD-10-CM

## 2020-09-12 DIAGNOSIS — Z515 Encounter for palliative care: Secondary | ICD-10-CM

## 2020-09-12 DIAGNOSIS — Z7189 Other specified counseling: Secondary | ICD-10-CM

## 2020-09-12 MED ORDER — LORAZEPAM 2 MG/ML IJ SOLN: 1.0000 mg | INTRAMUSCULAR | Status: DC | PRN

## 2020-09-12 MED ORDER — SODIUM CHLORIDE 0.9 % IV SOLN
0.2500 mg/h | INTRAVENOUS | Status: DC
  Administered 2020-09-12: 0.25 mg/h via INTRAVENOUS
  Filled 2020-09-12 (×2): qty 5

## 2020-09-12 MED ORDER — MORPHINE 100MG IN NS 100ML (1MG/ML) PREMIX INFUSION: 2.0000 mg/h | INTRAVENOUS | Status: DC

## 2020-09-12 NOTE — Consult Note (Signed)
Palliative Medicine Inpatient Consult Note  Reason for consult:    HPI: Per intake H&P  By Dr. Quinn Axe 09/11/20-->  "Yolanda Huffman is a 80 y.o. female with a PMHx of  A fib not on AC 2/2 prior GIB, CAD s/p PCI, MI, IDDM II, CKD IV, HLD, Cs/dHF, left breast cancer, GERD, and anemia. Patient presented to ED as a code stroke due to left sided weakness and right gaze preference. Patient lives alone and is checked on frequently by daughter. Daughter last saw patient in usual state of health on 09/10/20 at 1400 hrs. Daughter reports calling patient this am and patient did not answer the phone. Daughter went to patient's home and found her with left sided weakness and urinary incontinence. Daughter called 911.   Patient was taken emergently to CT suite. CT head showed large R MCA infarct, moderate L PCA/MCA infarct, L PICA infarct with L>>R cerebellar infarct. No acute intracranial hemorrhage. Hyperdense right carotid terminus and MCA. She was not a tPA candidate 2/2 presenting outside the window. The degree of hypoattenuation in those areas indicated completed or nearly completed infarct and patient was felt to have suffered stroke soon after she was last seen normal yesterday afternoon. Thus she would not be a candidate for intervention. CTA was not performed because it would not change management. Subsequently I held a family meeting with daughter Yolanda Huffman) and patient together. Patient was still conversant and demonstrated she still had capacity for her own decision-making. I explained the severity and extent of her strokes and that for the reasons described above there was nothing we could do to fix them. I explained that her brain is expected to swell and that this would likely not be survivable. Patient and daughter Yolanda Huffman agreed to proceed with comfort care."   Clinical Assessment/Goals of Care: I have reviewed medical records including EPIC notes, labs and imaging, received report from bedside RN Gaspar Bidding,  assessed the patient.    I met with patient's daughter Yolanda Huffman and her husband to further discuss diagnosis prognosis, GOC, EOL wishes, disposition and options.   I introduced Palliative Medicine as specialized medical care for people living with serious illness. It focuses on providing relief from the symptoms and stress of a serious illness. The goal is to improve quality of life for both the patient and the family.  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 She lived alone prior to the stroke. Yolanda Huffman is the only child and the patient's husband died 53 years ago. She has one cat at home. 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 Son-in-law is a deacon in their church.  A detailed discussion was had today regarding advanced directives.  Concepts specific to code status, artifical feeding and hydration was had. We confirmed DNR status and the desire to keep Yolanda Huffman comfortable.  Values and goals of care important to patient and family were attempted to be elicited.   Yolanda Huffman would like inpatient hospice care for her mother.  She expressed concerns over care during the night shift but stated she felt her mother was more comfortable after medications given this moning for increased oral secretions and pain.   Discussed the importance of continued conversation with family and their  medical providers regarding overall plan of care and treatment options, ensuring decisions are within the context of the patients values and GOCs.  Provided Yolanda  "Gone From My Site" booklet.  Decision Maker: Daughter, Yolanda Huffman     Code Status/Advance Care Planning:  DNAR/DNI    Symptom Management:  Pain: Hydromorphone drip at 0.5 ml/hr Fever: Acetaminophen prn Agitation: Haloperidol prn Twitching, potential for seizures: Lorazepam 42m  prn Increased oral secretions: Glycopyrrolate prn    Palliative  Prophylaxis:   Nausea/vomiting: Ondansetron  Minimize fluids to decrease oral secretions    Psycho-social/Spiritual:   Desire for further Chaplaincy support: No, has support from home church   Prognosis: Poor, massive stroke, no treatment options.  Currently temperature elevation, likely neuro related.  Discharge Planning: Inpatient hospice request placed.    Vitals with BMI 09/12/2020 09/12/2020 09/11/2020  Height - - -  Weight - - -  BMI - - -  Systolic [id number] Diastolic 63 74 59  Pulse 79 71 70     PPS: 10%   This conversation/these Huffman were discussed with patient primary care team, Dr. XErlinda Hongand Nurse BGaspar Bidding  Thank you for the opportunity to participate in the care of this patient and family.   Time In:1:00 Time Out: 2:10 Total Time: 70 minutes Greater than 50%  of this time was spent counseling and coordinating care related to the above assessment and plan.  ALindell Spar NP [facility] Palliative Medicine Team Team Cell Phone: [phone] utilize secure chat with additional questions, if there is no response within 30 minutes please call the above phone number  Palliative Medicine Team providers are available by phone from 7am to 7pm daily and can be reached through the team cell phone.  Should this patient require assistance outside of these hours, please call the patient's attending physician.

## 2020-09-12 NOTE — Progress Notes (Addendum)
STROKE TEAM PROGRESS NOTE   INTERVAL HISTORY Comfort care status since last pm.  Patient's responsiveness has declined since last night. She is currently unreponsive to verbal stimuli.  Daughter is tearful and reports care concerns overnight. She is worried patient has not been made comfortable enough from a respiratory and pain standpoint. She reports patient has been grimacing and has had a wet sounding coughing and a "hard time breathing" in the past few hours. Robinol helped somewhat.  Extensive discussion with daughter and husband exploring options for end of life care planning and plan of care.  Vitals:   09/11/20 1515 09/11/20 1935 09/12/20 0823 09/12/20 0925  BP: (!) 174/61 (!) 185/59 (!) 177/74   Pulse: 70 70 71   Resp: 20 16 (!) 22   Temp: 98.4 F (36.9 C) 99.1 F (37.3 C) 99.6 F (37.6 C) (!) 100.8 F (38.2 C)  TempSrc: Axillary Oral Oral Rectal  SpO2: 94% 97% 94%   Weight: 75.3 kg     Height: '5\' 3"'  (1.6 m)      CBC:  Recent Labs  Lab 09/11/20 1410 09/11/20 1418  WBC 11.8*  --   NEUTROABS 10.5*  --   HGB 12.8 12.9  HCT 39.0 38.0  MCV 92.6  --   PLT 217  --    Basic Metabolic Panel:  Recent Labs  Lab 09/11/20 1410 09/11/20 1418  NA 137 139  K 2.9* 2.9*  CL 101 103  CO2 22  --   GLUCOSE 181* 177*  BUN 65* 59*  CREATININE 3.16* 3.20*  CALCIUM 9.7  --    Lipid Panel: No results for input(s): CHOL, TRIG, HDL, CHOLHDL, VLDL, LDLCALC in the last 168 hours. HgbA1c: No results for input(s): HGBA1C in the last 168 hours. Urine Drug Screen: No results for input(s): LABOPIA, COCAINSCRNUR, LABBENZ, AMPHETMU, THCU, LABBARB in the last 168 hours.  Alcohol Level  Recent Labs  Lab 09/11/20 1410  ETH <10    IMAGING past 24 hours CT HEAD CODE STROKE WO CONTRAST  Addendum Date: 09/11/2020   ADDENDUM REPORT: 09/11/2020 18:55 ADDENDUM: It has come to our attention that there is an unfortunate artifact related to this scanner which is manifested as low density  appearance in the left posterior cerebral hemisphere and left cerebellar hemisphere. Clearly, the right MCA territory infarction is real. However, I think the low-density described in the left cerebral and cerebellar hemispheres probably relates to the described artifact. Electronically Signed   By: Nelson Chimes M.D.   On: 09/11/2020 18:55   Result Date: 09/11/2020 CLINICAL DATA:  Code stroke. EXAM: CT HEAD WITHOUT CONTRAST TECHNIQUE: Contiguous axial images were obtained from the base of the skull through the vertex without intravenous contrast. COMPARISON:  None. FINDINGS: Brain: Large area of hypoattenuation with loss of gray differentiation involving right frontal, parietal, and temporal lobes as well as the insula and basal ganglia. This involves MCA greater than ACA territories. There is also hypoattenuation and loss of gray differentiation left parietooccipital lobes. Hypoattenuation is present in the left cerebellar hemisphere. No acute intracranial hemorrhage. Mass effect is minor at this time. Small chronic left caudate infarct. Vascular: Hyperdensity along the right MCA. Skull: Unremarkable. Sinuses/Orbits: No acute finding. Other: Mastoid air cells are clear. IMPRESSION: Large right MCA territory infarction with some involvement the ACA territory. Moderate left PCA/MCA territory infarction. Large left PICA territory infarction. No acute intracranial hemorrhage. Hyperdense right carotid terminus and MCA. Electronically Signed: By: Macy Mis M.D. On: 09/11/2020 12:59  PHYSICAL EXAM Elderly female lying in bed, eyes closed, with labored dry regular respirations. She is unresponsive to verbal or gentle tactile stimuli or pupillary assessment.  Left pupil is 28m and sluggishly reactive Right pupil is dilated and minimally sluggishly reactive No spontaneous movement   ASSESSMENT/PLAN 80yo female  presenting with multiple large territory completed ischemic strokes and NIHSS 16 with. Outside  window for tPA and not a candidate for intervention 2/2 large amount of completed infarct on CTH. CTA was not performed bc it would not change mgmt. See HPI for summary of [place], patient and daughter HCPOA agreed to proceed with comfort care measures only.  CT head large right MCA territory infarction with some involvement the ACA territory with moderate left PCA/MCA territory infarction and large left PICA territory infarction. No acute intracranial hemorrhage. Hyperdense right carotid terminus and MCA.  Comfort measures 1. Transition to dilaudid drip and titrate to address comfort concerns.  2. Continue prn medications for comfort measures: haldol for agitation, robinul for secretions, ativan for seizures. Per discussion with pharmacy Dilaudid gtt 0.218mhr initiated.  3. Palliative care consult called. 4. Plan of care extensively discussed with daughter and son-in -law at bedside. They want to consider inpatient hospice. Reaffirmed comfort care focus of care. Provided reassurance and supportive discussion regarding caregiver role. Many questions answered.  5. SW, HaLanae Boastnotified patient may need hospice transfer.  6. Requested transfer to medical hospitalist service. Dr. AmReesa Chewccepted.   This plan of care was directed by Dr. XuErlinda Hong DeHetty BlendNP-C   ATTENDING NOTE: I reviewed above note and agree with the assessment and plan. Pt was seen and examined.   I met pt daughter in room. Family just had discussion with our palliative care NP about comfort care measures. Family has some complain of overnight care on the unit but seems to be miscommunication and PRN orders instead of continuous infusion. Currently pt was put on dilaudid drip (not morphine drip due to kidney function). Pt seems comfortable and not in distress. Discussed with daughter and will work with SW and case manager to transfer to hospice once bed available.   JiRosalin HawkingMD PhD Stroke  Neurology 09/12/2020 11:32 PM    To contact Stroke Continuity provider, please refer to [email or web address], contact General Neurology

## 2020-09-12 NOTE — TOC Initial Note (Signed)
Transition of Care [facility]) - Initial/Assessment Note    Patient Details  Name: Yolanda Huffman MRN: [id number] Date of Birth: 09/24/1940  Transition of Care [facility]) CM/SW Contact:    Coralee Pesa, [place] Phone Number: 09/12/2020, 4:52 PM  Clinical Narrative:                 CSW was informed that family is interested in inpatient hospice at this time. CSW provided options and support to daughter who chose [facility]. CSW made referral and will follow for DC tomorrow.   Expected Discharge Plan: [place]     Patient Goals and CMS Choice        Expected Discharge Plan and Services Expected Discharge Plan: [place] Choice: Hospice Living arrangements for the past 2 months: Apartment                                      Prior Living Arrangements/Services Living arrangements for the past 2 months: Apartment Lives with:: Self Patient language and need for interpreter reviewed:: Yes        Need for Family Participation in Patient Care: Yes (Comment) Care giver support system in place?: Yes (comment)   Criminal Activity/Legal Involvement Pertinent to Current Situation/Hospitalization: No - Comment as needed  Activities of Daily Living      Permission Sought/Granted Permission sought to share information with : Family Chief Financial Officer Permission granted to share information with : Yes, Verbal Permission Granted  Share Information with NAME: [place] granted to share info w AGENCY: [facility] granted to share info w Relationship: Daughter  Permission granted to share info w Contact Information: [phone]  Emotional Assessment Appearance:: Appears stated age Attitude/Demeanor/Rapport: Unable to Assess Affect (typically observed): Unable to Assess Orientation: : Oriented to Self Alcohol / Substance Use: Not Applicable Psych  Involvement: No (comment)  Admission diagnosis:  Acute ischemic stroke ([place]) [I63.9] Ischemic stroke [facility]) [I63.9] Patient Active Problem List   Diagnosis Date Noted  . Ischemic stroke ([place]) 09/11/2020  . Sinus node dysfunction ([place]) 03/21/2019  . Mitral valve insufficiency 03/01/2019  . 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 Acute CHF (congestive heart failure) ([place]) 08/09/2018  . Lesion of right lung 08/09/2018  . Gastrointestinal bleeding 07/27/2018  . Acute lower GI bleeding 07/25/2018  . Acute-on-chronic kidney injury ([place]) 07/25/2018  . Supratherapeutic INR 07/25/2018  . Iron deficiency anemia due to chronic blood loss 04/18/2018  . AKI (acute kidney injury) ([place]) 04/17/2018  . Symptomatic anemia 04/17/2018  . Status post right hip replacement 09/04/2017  . Chronic combined systolic and diastolic CHF (congestive heart failure) ([place]) 07/25/2017  . Closed displaced fracture of right femoral neck ([place]) 07/22/2017  . Genetic testing 09/07/2016  . 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 Gout 11/03/2015  . Personal history of colonic polyps 04/15/2015  . Chronic anticoagulation 04/15/2015  . Insulin dependent diabetes mellitus 04/15/2015  . Encounter for anticoagulation discussion and counseling 08/01/2013  . CKD (chronic kidney disease) 06/14/2011  . Pleural effusion 06/10/2011  . DOE (dyspnea on exertion) 06/10/2011  . Edema 11/30/2010  . Duodenal ulcer 11/12/2010  . Pneumonia 11/12/2010  . Anemia associated with acute blood loss 08/12/2010  . BLOOD IN STOOL 08/06/2010  . ATRIAL FIBRILLATION 03/24/2010  . 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 Hyperlipidemia 02/23/2010  . Essential hypertension 02/23/2010  . Coronary atherosclerosis 02/23/2010  . UTI'S, RECURRENT 02/23/2010  . Osteoporosis 02/23/2010  . Cardiac pacemaker in situ 02/23/2010   PCP:  Eulas Post, MD Pharmacy:   [facility] [address], [place] [address] [place] [address] Phone: [phone] Fax: [phone]     Social Determinants of Health (SDOH) Interventions    Readmission Risk Interventions No flowsheet data found.

## 2020-09-12 NOTE — Discharge Summary (Shared)
Stroke Discharge Summary  Patient ID: Yolanda Huffman   MRN: [id number]      DOB: 16-Apr-1941  Date of Admission: 09/11/2020 Date of Discharge: 09/12/2020  Admitting Physician:  Rosalin Hawking, MD, Stroke MD Discharge Physician: *** Consultant(s):   Palliative Care Patient's PCP:  Eulas Post, MD  DISCHARGE DIAGNOSIS:  Active Problems:   Ischemic stroke ([place]) 1. Multiple large territory completed ischemic strokes  2. End of life, comfort care status  Allergies as of 09/12/2020      Reactions   Amlodipine Besylate Swelling    Med Rec must be completed prior to using this [facility]***       LABORATORY STUDIES CBC    Component Value Date/Time   WBC 11.8 (H) 09/11/2020 1410   RBC 4.21 09/11/2020 1410   HGB 12.9 09/11/2020 1418   HGB 11.4 (L) 01/08/2020 1102   HGB 12.4 04/18/2017 0950   HCT 38.0 09/11/2020 1418   HCT 36.9 04/18/2017 0950   PLT 217 09/11/2020 1410   PLT 179 01/08/2020 1102   PLT 181 04/18/2017 0950   MCV 92.6 09/11/2020 1410   MCV 88.6 04/18/2017 0950   MCH 30.4 09/11/2020 1410   MCHC 32.8 09/11/2020 1410   RDW 13.4 09/11/2020 1410   RDW 15.0 (H) 04/18/2017 0950   LYMPHSABS 0.7 09/11/2020 1410   LYMPHSABS 1.3 04/18/2017 0950   MONOABS 0.5 09/11/2020 1410   MONOABS 0.6 04/18/2017 0950   EOSABS 0.0 09/11/2020 1410   EOSABS 0.1 04/18/2017 0950   BASOSABS 0.0 09/11/2020 1410   BASOSABS 0.1 04/18/2017 0950   CMP    Component Value Date/Time   NA 139 09/11/2020 1418   NA 137 05/28/2019 0000   NA 134 (L) 04/18/2017 0950   K 2.9 (L) 09/11/2020 1418   K 3.4 (L) 04/18/2017 0950   CL 103 09/11/2020 1418   CO2 22 09/11/2020 1410   CO2 26 04/18/2017 0950   GLUCOSE 177 (H) 09/11/2020 1418   GLUCOSE 139 04/18/2017 0950   BUN 59 (H) 09/11/2020 1418   BUN 50 (A) 05/28/2019 0000   BUN 53.9 (H) 04/18/2017 0950   CREATININE 3.20 (H) 09/11/2020 1418   CREATININE 3.04 (HH) 11/22/2018 1022   CREATININE 2.4 (H) 04/18/2017 0950   CALCIUM 9.7  09/11/2020 1410   CALCIUM 9.1 04/18/2017 0950   PROT 8.1 09/11/2020 1410   PROT 7.4 04/18/2017 0950   ALBUMIN 3.8 09/11/2020 1410   ALBUMIN 3.4 (L) 04/18/2017 0950   AST 74 (H) 09/11/2020 1410   AST 20 11/22/2018 1022   AST 28 04/18/2017 0950   ALT 61 (H) 09/11/2020 1410   ALT 11 11/22/2018 1022   ALT 18 04/18/2017 0950   ALKPHOS 47 09/11/2020 1410   ALKPHOS 42 04/18/2017 0950   BILITOT 1.1 09/11/2020 1410   BILITOT 0.4 11/22/2018 1022   BILITOT 0.55 04/18/2017 0950   GFRNONAA 14 (L) 09/11/2020 1410   GFRNONAA 14 (L) 11/22/2018 1022   GFRAA 16 (L) 01/08/2020 1102   GFRAA 16 (L) 11/22/2018 1022   COAGS Lab Results  Component Value Date   INR 1.2 09/11/2020   INR 1.4 (A) 07/30/2018   INR 1.09 07/26/2018   Lipid Panel    Component Value Date/Time   CHOL 159 08/19/2020 1445   TRIG 160.0 (H) 08/19/2020 1445   HDL 56.20 08/19/2020 1445   CHOLHDL 3 08/19/2020 1445   VLDL 32.0 08/19/2020 1445   LDLCALC 71 08/19/2020 1445   HgbA1C  Lab  Results  Component Value Date   HGBA1C 6.6 (A) 08/19/2020   Urinalysis    Component Value Date/Time   COLORURINE YELLOW 08/09/2018 2130   APPEARANCEUR CLEAR 08/09/2018 2130   LABSPEC 1.008 08/09/2018 2130   PHURINE 5.0 08/09/2018 2130   GLUCOSEU NEGATIVE 08/09/2018 2130   GLUCOSEU NEGATIVE 07/01/2011 1203   HGBUR SMALL (A) 08/09/2018 2130   BILIRUBINUR NEGATIVE 08/09/2018 2130   BILIRUBINUR neg 05/02/2013 1215   KETONESUR NEGATIVE 08/09/2018 2130   PROTEINUR NEGATIVE 08/09/2018 2130   UROBILINOGEN 0.2 05/02/2013 1215   UROBILINOGEN 0.2 07/01/2011 1203   NITRITE NEGATIVE 08/09/2018 2130   LEUKOCYTESUR SMALL (A) 08/09/2018 2130   Urine Drug Screen No results found for: LABOPIA, COCAINSCRNUR, LABBENZ, AMPHETMU, THCU, LABBARB  Alcohol Level    Component Value Date/Time   ETH <10 09/11/2020 1410     SIGNIFICANT DIAGNOSTIC STUDIES CT HEAD CODE STROKE WO CONTRAST  Addendum Date: 09/11/2020   ADDENDUM REPORT: 09/11/2020 18:55  ADDENDUM: It has come to our attention that there is an unfortunate artifact related to this scanner which is manifested as low density appearance in the left posterior cerebral hemisphere and left cerebellar hemisphere. Clearly, the right MCA territory infarction is real. However, I think the low-density described in the left cerebral and cerebellar hemispheres probably relates to the described artifact. Electronically Signed   By: Nelson Chimes M.D.   On: 09/11/2020 18:55   Result Date: 09/11/2020 CLINICAL DATA:  Code stroke. EXAM: CT HEAD WITHOUT CONTRAST TECHNIQUE: Contiguous axial images were obtained from the base of the skull through the vertex without intravenous contrast. COMPARISON:  None. FINDINGS: Brain: Large area of hypoattenuation with loss of gray differentiation involving right frontal, parietal, and temporal lobes as well as the insula and basal ganglia. This involves MCA greater than ACA territories. There is also hypoattenuation and loss of gray differentiation left parietooccipital lobes. Hypoattenuation is present in the left cerebellar hemisphere. No acute intracranial hemorrhage. Mass effect is minor at this time. Small chronic left caudate infarct. Vascular: Hyperdensity along the right MCA. Skull: Unremarkable. Sinuses/Orbits: No acute finding. Other: Mastoid air cells are clear. IMPRESSION: Large right MCA territory infarction with some involvement the ACA territory. Moderate left PCA/MCA territory infarction. Large left PICA territory infarction. No acute intracranial hemorrhage. Hyperdense right carotid terminus and MCA. Electronically Signed: By: Macy Mis M.D. On: 09/11/2020 12:59      HISTORY OF PRESENT ILLNESS  80 yo female  presenting with multiple large territory completed ischemic strokes and NIHSS 16 with. Outside window for tPA and not a candidate for intervention 2/2 large amount of completed infarct on CTH. CTA was not performed bc it would not change mgmt. See HPI  for summary of [place] discussion, patient and daughter HCPOA agreed to proceed with comfort care measures only.   HOSPITAL COURSE Patient was admitted to the neurology floor. Comfort care measures were implemented. Dilaudid drip was initiated 3/19. Palliative care was consulted. Inpatient hospice was selected as plan of care. Case management team arranged discharge to hospice.    DISCHARGE EXAM Blood pressure (!) 157/63, pulse 79, temperature 100.3 F (37.9 C), temperature source Rectal, resp. rate (!) 22, height 5\' 3"  (1.6 m), weight 75.3 kg, SpO2 94 %. ***  Discharge Diet       There are no active orders of the following types: Diet, Nourishments.   liquids for comfort   DISCHARGE PLAN  [facility]   ***

## 2020-09-13 DIAGNOSIS — Z515 Encounter for palliative care: Secondary | ICD-10-CM

## 2020-09-13 MED ORDER — HALOPERIDOL LACTATE 2 MG/ML PO CONC
0.5000 mg | ORAL | 0 refills | Status: AC | PRN
Start: 2020-09-13 — End: ?

## 2020-09-13 MED ORDER — HYDROMORPHONE HCL 4 MG PO TABS: 4.0000 mg | ORAL_TABLET | ORAL | 0 refills | Status: AC | PRN

## 2020-09-13 MED ORDER — GLYCOPYRROLATE 1 MG PO TABS: 1.0000 mg | ORAL_TABLET | ORAL | 0 refills | Status: AC | PRN

## 2020-09-13 NOTE — Discharge Summary (Signed)
Physician Discharge Summary  Divina Neale IEP:[id number] DOB: Dec 25, 1940 DOA: 09/11/2020  PCP: Eulas Post, MD  Admit date: 09/11/2020 Discharge date: 09/13/2020  Admitted From: home Disposition:  hospice  Discharge Condition: hospice CODE STATUS: DNR Diet recommendation: comfort  HPI: Per admitting MD, 80 year old female with A. fib not on anticoagulation due to prior GI bleeding, diabetes mellitus, chronic kidney disease stage IV, hyperlipidemia, anemia came into the hospital as a code stroke due to left-sided weakness and right gaze preference.  She was found down per family and it was unknown when symptoms started.  CT angiogram on admission showed large right MCA territory infarction with some involvement of the ACA, as well as moderate left PCA territory infarction and large left PICA territory infarction.  No hemorrhage.  Given grim prognosis and extensive stroke she was transitioned to comfort care  Hospital Course / Discharge diagnoses: Principal Problem MCA territory infarction-large territory, poor prognosis.  Currently on comfort care.  Active Problems End-of-life care Chronic kidney disease stage IV Hypokalemia Insulin-dependent diabetes mellitus with hyperglycemia Leukocytosis Hyperlipidemia Hypertension Chronic atrial fibrillation Coronary atherosclerosis History of GI bleed Iron deficiency anemia Osteoporosis In situ ductal carcinoma  Sepsis ruled out   Discharge Instructions   Allergies as of 09/13/2020      Reactions   Amlodipine Besylate Swelling      Medication List    STOP taking these medications   acetaminophen 500 MG tablet Commonly known as: TYLENOL   anastrozole 1 MG tablet Commonly known as: ARIMIDEX   atorvastatin 40 MG tablet Commonly known as: LIPITOR   calcium carbonate 500 MG chewable tablet Commonly known as: TUMS - dosed in mg elemental calcium   Cranberry 500 MG Caps   denosumab 60 MG/ML Sosy  injection Commonly known as: PROLIA   Fish Oil 1000 MG Caps   furosemide 40 MG tablet Commonly known as: LASIX   glucose blood test strip   Insulin Pen Needle 32G X 4 MM Misc Commonly known as: BD Pen Needle Nano U/F   Iron 325 (65 Fe) MG Tabs   Levemir FlexTouch 100 UNIT/ML FlexPen Generic drug: insulin detemir   metolazone 2.5 MG tablet Commonly known as: ZAROXOLYN   metoprolol succinate 25 MG 24 hr tablet Commonly known as: TOPROL-XL   multivitamin with minerals Tabs tablet   pantoprazole 40 MG tablet Commonly known as: PROTONIX   potassium chloride SA 20 MEQ tablet Commonly known as: Klor-Con M20     TAKE these medications   glycopyrrolate 1 MG tablet Commonly known as: ROBINUL Take 1 tablet (1 mg total) by mouth every 4 (four) hours as needed (excessive secretions).   haloperidol 2 MG/ML solution Commonly known as: HALDOL Place 0.3 mLs (0.6 mg total) under the tongue every 4 (four) hours as needed for agitation (or delirium).   HYDROmorphone 4 MG tablet Commonly known as: Dilaudid Take 1 tablet (4 mg total) by mouth every 4 (four) hours as needed for severe pain.        Consultations:  Neurology  Palliative care  Procedures/Studies:  CT HEAD CODE STROKE WO CONTRAST  Addendum Date: 09/11/2020   ADDENDUM REPORT: 09/11/2020 18:55 ADDENDUM: It has come to our attention that there is an unfortunate artifact related to this scanner which is manifested as low density appearance in the left posterior cerebral hemisphere and left cerebellar hemisphere. Clearly, the right MCA territory infarction is real. However, I think the low-density described in the left cerebral and cerebellar hemispheres probably relates to the  described artifact. Electronically Signed   By: Nelson Chimes M.D.   On: 09/11/2020 18:55   Result Date: 09/11/2020 CLINICAL DATA:  Code stroke. EXAM: CT HEAD WITHOUT CONTRAST TECHNIQUE: Contiguous axial images were obtained from the base of the  skull through the vertex without intravenous contrast. COMPARISON:  None. FINDINGS: Brain: Large area of hypoattenuation with loss of gray differentiation involving right frontal, parietal, and temporal lobes as well as the insula and basal ganglia. This involves MCA greater than ACA territories. There is also hypoattenuation and loss of gray differentiation left parietooccipital lobes. Hypoattenuation is present in the left cerebellar hemisphere. No acute intracranial hemorrhage. Mass effect is minor at this time. Small chronic left caudate infarct. Vascular: Hyperdensity along the right MCA. Skull: Unremarkable. Sinuses/Orbits: No acute finding. Other: Mastoid air cells are clear. IMPRESSION: Large right MCA territory infarction with some involvement the ACA territory. Moderate left PCA/MCA territory infarction. Large left PICA territory infarction. No acute intracranial hemorrhage. Hyperdense right carotid terminus and MCA. Electronically Signed: By: Macy Mis M.D. On: 09/11/2020 12:59      Subjective: unresponsive  Discharge Exam: BP 137/68 (BP Location: Left Arm)   Pulse 71   Temp 98.6 F (37 C) (Oral)   Resp 20   Ht 5\' 3"  (1.6 m)   Wt 75.3 kg   SpO2 93%   BMI 29.41 kg/m   General: unresponsive    The results of significant diagnostics from this hospitalization (including imaging, microbiology, ancillary and laboratory) are listed below for reference.     Microbiology: Recent Results (from the past 240 hour(s))  Resp Panel by RT-PCR (Flu A&B, Covid) Nasopharyngeal Swab     Status: None   Collection Time: 09/11/20  3:07 PM   Specimen: Nasopharyngeal Swab; Nasopharyngeal(NP) swabs in vial transport medium  Result Value Ref Range Status   SARS Coronavirus 2 by RT PCR NEGATIVE NEGATIVE Final    Comment: (NOTE) SARS-CoV-2 target nucleic acids are NOT DETECTED.  The SARS-CoV-2 RNA is generally detectable in upper respiratory specimens during the acute phase of infection. The  lowest concentration of SARS-CoV-2 viral copies this assay can detect is 138 copies/mL. A negative result does not preclude SARS-Cov-2 infection and should not be used as the sole basis for treatment or other patient management decisions. A negative result may occur with  improper specimen collection/handling, submission of specimen other than nasopharyngeal swab, presence of viral mutation(s) within the areas targeted by this assay, and inadequate number of viral copies(<138 copies/mL). A negative result must be combined with clinical observations, patient history, and epidemiological information. The expected result is Negative.  Fact Sheet for Patients:  EntrepreneurPulse.com.au  Fact Sheet for Healthcare Providers:  IncredibleEmployment.be  This test is no t yet approved or cleared by the [place] FDA and  has been authorized for detection and/or diagnosis of SARS-CoV-2 by FDA under an Emergency Use Authorization (EUA). This EUA will remain  in effect (meaning this test can be used) for the duration of the COVID-19 declaration under Section 564(b)(1) of the Act, 21 U.S.C.section 360bbb-3(b)(1), unless the authorization is terminated  or revoked sooner.       Influenza A by PCR NEGATIVE NEGATIVE Final   Influenza B by PCR NEGATIVE NEGATIVE Final    Comment: (NOTE) The Xpert Xpress SARS-CoV-2/FLU/RSV plus assay is intended as an aid in the diagnosis of influenza from Nasopharyngeal swab specimens and should not be used as a sole basis for treatment. Nasal washings and aspirates are unacceptable for Xpert  Xpress SARS-CoV-2/FLU/RSV testing.  Fact Sheet for Patients: EntrepreneurPulse.com.au  Fact Sheet for Healthcare Providers: IncredibleEmployment.be  This test is not yet approved or cleared by the [place] FDA and has been authorized for detection and/or diagnosis of SARS-CoV-2 by FDA under  an Emergency Use Authorization (EUA). This EUA will remain in effect (meaning this test can be used) for the duration of the COVID-19 declaration under Section 564(b)(1) of the Act, 21 U.S.C. section 360bbb-3(b)(1), unless the authorization is terminated or revoked.  Performed at [facility], [place] [address] [place], [place] [address]      Labs: Basic Metabolic Panel: Recent Labs  Lab 09/11/20 1410 09/11/20 1418  NA 137 139  K 2.9* 2.9*  CL 101 103  CO2 22  --   GLUCOSE 181* 177*  BUN 65* 59*  CREATININE 3.16* 3.20*  CALCIUM 9.7  --    Liver Function Tests: Recent Labs  Lab 09/11/20 1410  AST 74*  ALT 61*  ALKPHOS 47  BILITOT 1.1  PROT 8.1  ALBUMIN 3.8   CBC: Recent Labs  Lab 09/11/20 1410 09/11/20 1418  WBC 11.8*  --   NEUTROABS 10.5*  --   HGB 12.8 12.9  HCT 39.0 38.0  MCV 92.6  --   PLT 217  --    CBG: No results for input(s): GLUCAP in the last 168 hours. Hgb A1c No results for input(s): HGBA1C in the last 72 hours. Lipid Profile No results for input(s): CHOL, HDL, LDLCALC, TRIG, CHOLHDL, LDLDIRECT in the last 72 hours. Thyroid function studies No results for input(s): TSH, T4TOTAL, T3FREE, THYROIDAB in the last 72 hours.  Invalid input(s): FREET3 Urinalysis    Component Value Date/Time   COLORURINE YELLOW 08/09/2018 2130   APPEARANCEUR CLEAR 08/09/2018 2130   LABSPEC 1.008 08/09/2018 2130   PHURINE 5.0 08/09/2018 2130   GLUCOSEU NEGATIVE 08/09/2018 2130   GLUCOSEU NEGATIVE 07/01/2011 1203   HGBUR SMALL (A) 08/09/2018 2130   BILIRUBINUR NEGATIVE 08/09/2018 2130   BILIRUBINUR neg 05/02/2013 1215   KETONESUR NEGATIVE 08/09/2018 2130   PROTEINUR NEGATIVE 08/09/2018 2130   UROBILINOGEN 0.2 05/02/2013 1215   UROBILINOGEN 0.2 07/01/2011 1203   NITRITE NEGATIVE 08/09/2018 2130   LEUKOCYTESUR SMALL (A) 08/09/2018 2130    FURTHER DISCHARGE INSTRUCTIONS:   Get Medicines reviewed and adjusted: Please take all your medications with you  for your next visit with your Primary MD   Laboratory/radiological data: Please request your Primary MD to go over all hospital tests and procedure/radiological results at the follow up, please ask your Primary MD to get all Hospital records sent to his/her office.   In some cases, they will be blood work, cultures and biopsy results pending at the time of your discharge. Please request that your primary care M.D. goes through all the records of your hospital data and follows up on these results.   Also Note the following: If you experience worsening of your admission symptoms, develop shortness of breath, life threatening emergency, suicidal or homicidal thoughts you must seek medical attention immediately by calling 911 or calling your MD immediately  if symptoms less severe.   You must read complete instructions/literature along with all the possible adverse reactions/side effects for all the Medicines you take and that have been prescribed to you. Take any new Medicines after you have completely understood and accpet all the possible adverse reactions/side effects.    Do not drive when taking Pain medications or sleeping medications (Benzodaizepines)   Do not  take more than prescribed Pain, Sleep and Anxiety Medications. It is not advisable to combine anxiety,sleep and pain medications without talking with your primary care practitioner   Special Instructions: If you have smoked or chewed Tobacco  in the last 2 yrs please stop smoking, stop any regular Alcohol  and or any Recreational drug use.   Wear Seat belts while driving.   Please note: You were cared for by a hospitalist during your hospital stay. Once you are discharged, your primary care physician will handle any further medical issues. Please note that NO REFILLS for any discharge medications will be authorized once you are discharged, as it is imperative that you return to your primary care physician (or establish a relationship  with a primary care physician if you do not have one) for your post hospital discharge needs so that they can reassess your need for medications and monitor your lab values.  Time coordinating discharge: 15 minutes  SIGNED:  Marzetta Board, MD, PhD 09/13/2020, 12:08 PM

## 2020-09-13 NOTE — Plan of Care (Signed)
Discussed with daughter and answered all her questions. Also discussed with Dr. Cruzita Lederer and pt is on comfort care and dilaudid drip. Pending residential hospice placement if appropriate. Neurology will sign off at this time. Please feel free to call with questions.   Rosalin Hawking, MD PhD Stroke Neurology 09/13/2020 12:08 PM

## 2020-09-13 NOTE — Progress Notes (Deleted)
PROGRESS NOTE  Yolanda Huffman ZJQ:[id number] DOB: 1940/10/26 DOA: 09/11/2020 PCP: Eulas Post, MD   LOS: 2 days   Brief Narrative / Interim history: 80 year old female with A. fib not on anticoagulation due to prior GI bleeding, diabetes mellitus, chronic kidney disease stage IV, hyperlipidemia, anemia came into the hospital as a code stroke due to left-sided weakness and right gaze preference.  She was found down per family and it was unknown when symptoms started.  CT angiogram on admission showed large right MCA territory infarction with some involvement of the ACA, as well as moderate left PCA territory infarction and large left PICA territory infarction.  No hemorrhage.  Given grim prognosis and extensive stroke she was transitioned to comfort care  Subjective / 24h Interval events: Unresponsive, agonal breathing this morning  Assessment & Plan: Principal Problem MCA territory infarction-large territory, poor prognosis.  Currently on comfort care.  Active Problems End-of-life care-continue Dilaudid infusion and comfort oriented medications Chronic kidney disease stage IV Hypokalemia Insulin-dependent diabetes mellitus with hyperglycemia Leukocytosis Hyperlipidemia Hypertension Chronic atrial fibrillation Coronary atherosclerosis History of GI bleed Iron deficiency anemia Osteoporosis In situ ductal carcinoma  Scheduled Meds: Continuous Infusions: . sodium chloride 10 mL/hr at 09/12/20 1604  . HYDROmorphone 0.25 mg/hr (09/12/20 1317)   PRN Meds:.acetaminophen **OR** acetaminophen, glycopyrrolate **OR** glycopyrrolate **OR** glycopyrrolate, haloperidol **OR** haloperidol **OR** haloperidol lactate, LORazepam, ondansetron **OR** ondansetron (ZOFRAN) IV      Code Status: DNR  Family Communication: no family present  Status is: Inpatient  Remains inpatient appropriate because:Anticipating hospital death.  Agonal breathing this morning   Dispo: The patient is  from: Home              Anticipated d/c is to: In hospital death              Patient currently is not medically stable to d/c.   Difficult to place patient No  Level of care: Palliative Care  Consultants:  Neurology Palliative care  Procedures:  none  Microbiology  none  Antimicrobials: none    Objective: Vitals:   09/12/20 1235 09/12/20 1333 09/12/20 1600 09/12/20 1959  BP: (!) 157/63   137/68  Pulse: 79   71  Resp: (!) 22   20  Temp: 100.3 F (37.9 C) (!) 100.6 F (38.1 C) 100.3 F (37.9 C) 98.6 F (37 C)  TempSrc: Oral Rectal Rectal Oral  SpO2: 94%   93%  Weight:      Height:        Intake/Output Summary (Last 24 hours) at 09/13/2020 1055 Last data filed at 09/12/2020 1839 Gross per 24 hour  Intake --  Output 1200 ml  Net -1200 ml   Filed Weights   09/11/20 1200 09/11/20 1515  Weight: 75.3 kg 75.3 kg    Examination:  Constitutional: Obtunded, agonal breathing Respiratory: No wheezing Cardiovascular: Regular    Data Reviewed: I have independently reviewed following labs and imaging studies   CBC: Recent Labs  Lab 09/11/20 1410 09/11/20 1418  WBC 11.8*  --   NEUTROABS 10.5*  --   HGB 12.8 12.9  HCT 39.0 38.0  MCV 92.6  --   PLT 217  --    Basic Metabolic Panel: Recent Labs  Lab 09/11/20 1410 09/11/20 1418  NA 137 139  K 2.9* 2.9*  CL 101 103  CO2 22  --   GLUCOSE 181* 177*  BUN 65* 59*  CREATININE 3.16* 3.20*  CALCIUM 9.7  --    Liver Function  Tests: Recent Labs  Lab 09/11/20 1410  AST 74*  ALT 61*  ALKPHOS 47  BILITOT 1.1  PROT 8.1  ALBUMIN 3.8   Coagulation Profile: Recent Labs  Lab 09/11/20 1410  INR 1.2   HbA1C: No results for input(s): HGBA1C in the last 72 hours. CBG: No results for input(s): GLUCAP in the last 168 hours.  Recent Results (from the past 240 hour(s))  Resp Panel by RT-PCR (Flu A&B, Covid) Nasopharyngeal Swab     Status: None   Collection Time: 09/11/20  3:07 PM   Specimen:  Nasopharyngeal Swab; Nasopharyngeal(NP) swabs in vial transport medium  Result Value Ref Range Status   SARS Coronavirus 2 by RT PCR NEGATIVE NEGATIVE Final    Comment: (NOTE) SARS-CoV-2 target nucleic acids are NOT DETECTED.  The SARS-CoV-2 RNA is generally detectable in upper respiratory specimens during the acute phase of infection. The lowest concentration of SARS-CoV-2 viral copies this assay can detect is 138 copies/mL. A negative result does not preclude SARS-Cov-2 infection and should not be used as the sole basis for treatment or other patient management decisions. A negative result may occur with  improper specimen collection/handling, submission of specimen other than nasopharyngeal swab, presence of viral mutation(s) within the areas targeted by this assay, and inadequate number of viral copies(<138 copies/mL). A negative result must be combined with clinical observations, patient history, and epidemiological information. The expected result is Negative.  Fact Sheet for Patients:  EntrepreneurPulse.com.au  Fact Sheet for Healthcare Providers:  IncredibleEmployment.be  This test is no t yet approved or cleared by the [place] FDA and  has been authorized for detection and/or diagnosis of SARS-CoV-2 by FDA under an Emergency Use Authorization (EUA). This EUA will remain  in effect (meaning this test can be used) for the duration of the COVID-19 declaration under Section 564(b)(1) of the Act, 21 U.S.C.section 360bbb-3(b)(1), unless the authorization is terminated  or revoked sooner.       Influenza A by PCR NEGATIVE NEGATIVE Final   Influenza B by PCR NEGATIVE NEGATIVE Final    Comment: (NOTE) The Xpert Xpress SARS-CoV-2/FLU/RSV plus assay is intended as an aid in the diagnosis of influenza from Nasopharyngeal swab specimens and should not be used as a sole basis for treatment. Nasal washings and aspirates are unacceptable for  Xpert Xpress SARS-CoV-2/FLU/RSV testing.  Fact Sheet for Patients: EntrepreneurPulse.com.au  Fact Sheet for Healthcare Providers: IncredibleEmployment.be  This test is not yet approved or cleared by the [place] FDA and has been authorized for detection and/or diagnosis of SARS-CoV-2 by FDA under an Emergency Use Authorization (EUA). This EUA will remain in effect (meaning this test can be used) for the duration of the COVID-19 declaration under Section 564(b)(1) of the Act, 21 U.S.C. section 360bbb-3(b)(1), unless the authorization is terminated or revoked.  Performed at [facility], [place] [address], [place], [place] [address]      Radiology Studies: No results found.   Marzetta Board, MD, PhD [facility]  Between 7 am - 7 pm I am available, please contact me via Amion or Securechat  Between 7 pm - 7 am I am not available, please contact night coverage MD/APP via Amion

## 2020-09-13 NOTE — Progress Notes (Signed)
Patient discharged to Hospice with PIV and foley cath intact.

## 2020-09-13 NOTE — Plan of Care (Signed)
Adequate for discharge.

## 2020-09-13 NOTE — TOC Transition Note (Signed)
Transition of Care [facility]) - CM/SW Discharge Note   Patient Details  Name: Yolanda Huffman MRN: [id number] Date of Birth: 15-Mar-1941  [facility] CM/SW Contact:  Coralee Pesa, [place] Phone Number: 09/13/2020, 12:34 PM   Clinical Narrative:    Pt to be transferred to [place] [facility] [place] @ 2 pm.  Nurse to call report to [phone].   Final next level of care: [place] Barriers to Discharge: Barriers Resolved   Patient Goals and CMS Choice        Discharge Placement              Patient chooses bed at:  [facility] [place]) Patient to be transferred to facility by: [place] Name of family member notified: Sonja Patient and family notified of of transfer: 09/13/20  Discharge Plan and Services     Post Acute Care Choice: Hospice                               Social Determinants of Health (SDOH) Interventions     Readmission Risk Interventions No flowsheet data found.

## 2020-09-14 ENCOUNTER — Encounter: Payer: Self-pay | Admitting: Oncology

## 2020-09-15 ENCOUNTER — Telehealth: Payer: Self-pay

## 2020-09-15 ENCOUNTER — Telehealth: Payer: Self-pay | Admitting: Family Medicine

## 2020-09-16 ENCOUNTER — Other Ambulatory Visit: Payer: Self-pay | Admitting: Oncology

## 2020-09-16 NOTE — Progress Notes (Unsigned)
[place]  [phone] [phone]     ID: Yolanda Huffman DOB: 1940-08-30  MR#: [id number]  BWL#:[id number]  Patient Care Team: Eulas Post, MD as PCP - General (Family Medicine) Buford Dresser, MD as PCP - Cardiology (Cardiology) Donato Heinz, MD as Consulting Physician (Nephrology) Magrinat, Virgie Dad, MD as Consulting Physician (Oncology) Jovita Kussmaul, MD as Consulting Physician (General Surgery) Causey, Charlestine Massed, NP as Nurse Practitioner (Hematology and Oncology) Mcarthur Rossetti, MD as Consulting Physician (Orthopedic Surgery) Mauri Pole, MD as Consulting Physician (Gastroenterology) Evans Lance, MD as Consulting Physician (Cardiology) Chauncey Cruel, MD OTHER MD:   CHIEF COMPLAINT: Ductal carcinoma in situ   CURRENT TREATMENT: Anastrozole, denosumab/Prolia   INTERVAL HISTORY: [name] returns today for follow-up and treatment of her ductal carcinoma in situ. She was last seen here on 05/28/2019.   She continues on anastrozole.  She tolerates this with no side effects that she is aware of  She also continues on Prolia, with her most recent dose 80/06/2018.  She is scheduled for treatment today.  Tarryn's last bone density screening on 08/16/2018, showed a T-score of -2.9, which is considered osteopenic.   Since her last visit here, she underwent a digital diagnostic bilateral mammogram with tomography on 12/19/2019 revealing no evidence of breast malignancy.    REVIEW OF SYSTEMS: Yehudit had both doses of the modern her vaccines.  She had mild side effects from the second dose but "no big deal".  She does her housework and cooking and walks around the complex for exercise.  She has noted no change in either breast.  A detailed review of systems today was stable   BREAST CANCER HISTORY: From the original intake note:  The patient had routine bilateral screening mammography with  tomography at the [facility] 07/06/2016, showing new calcifications in the left breast. The right breast was unremarkable. Unilateral left diagnostic mammography 07/27/2016 found the breast density to be category B. In the upper inner quadrant of the left breast there was a 0.7 cm group of pleomorphic calcifications.  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  Her subsequent history is as detailed below   PAST MEDICAL HISTORY: Past Medical History:  Diagnosis Date  . Anemia   . Arthritis    gout  . Atrial fibrillation ([place])    prior Sotalol - d/c'd 2/2 increased creatinine; rate control strategy  . CAD (coronary artery disease)    s/p PCI in [place] in 1/09  . Cancer [facility])    left breast  . Chronic combined systolic and diastolic heart failure (HCC)    Echocardiogram 3/12: Mild LVH, EF 68-11%, normal diastolic function, mild AI, mild MR, PASP 44, normal wall motion  . CKD (chronic kidney disease)    sees Dr. Burman Foster - Stage 3 ?  Marland Kitchen Diabetes mellitus   . Hyperlipemia   . Hypertension   . MI, old    2009  . Osteoporosis   . Pacemaker   . Pleural effusion   . Pneumonia   . PUD (peptic ulcer disease) 10/2010   duodenal ulcer    PAST SURGICAL HISTORY: Past Surgical History:  Procedure Laterality Date  . BREAST BIOPSY Left 07/29/2016  . BREAST LUMPECTOMY Left 09/19/2016  . BREAST LUMPECTOMY WITH RADIOACTIVE SEED LOCALIZATION Left 09/19/2016   Procedure: LEFT BREAST LUMPECTOMY WITH RADIOACTIVE SEED  LOCALIZATION;  Surgeon: Autumn Messing III, MD;  Location: [facility];  Service: General;  Laterality: Left;  . CATARACT EXTRACTION Bilateral    01/12/09 and 01/26/09 both eyes  . COLONOSCOPY    . CORONARY ANGIOPLASTY  2009  . PACEMAKER INSERTION  07/13/07  . PPM GENERATOR CHANGEOUT N/A 12/17/2018   Procedure: PPM GENERATOR CHANGEOUT;   Surgeon: Evans Lance, MD;  Location: [facility];  Service: Cardiovascular;  Laterality: N/A;  . stent implant  07/13/2007  . TOTAL HIP ARTHROPLASTY Right 07/23/2017   Procedure: TOTAL HIP ARTHROPLASTY ANTERIOR APPROACH;  Surgeon: Mcarthur Rossetti, MD;  Location: [facility];  Service: Orthopedics;  Laterality: Right;    FAMILY HISTORY Family History  Problem Relation Age of Onset  . Heart disease Father   . Heart attack Father   . Clotting disorder Father        blood clot  . Hypertension Father   . Heart disease Mother   . Breast cancer Maternal Aunt 23  . Breast cancer Sister 67  . Breast cancer Other 40  . Colon cancer Neg Hx    The patient's father died at age 33 from a myocardial infarction. The patient's mother died from multiple problems including pleurisy at the age of 35. The patient has 3 brothers, 2 sisters. One sister was diagnosed with breast cancer in her late 68s. The other sister had a "vaginal cancer" (? Cervical cancer). A maternal aunt was diagnosed with breast cancer in her 40s and her daughter, the patient's niece was diagnosed with breast cancer in her 2s.   GYNECOLOGIC HISTORY:  No LMP recorded. Patient is postmenopausal. Menarche age 33, first live birth age 50, the patient is GX P1. She stopped having periods in 1992. She did not take hormone replacement. She took oral contraceptives briefly remotely with no complications.    SOCIAL HISTORY: (Updated 07/18/2018) She is originally from [place]. 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 She is a widow. She lives with her cat (adopted at 86 year old on 08/06/2014), Chrissy, in a retirement community. Her daughter Yolanda Huffman lives in [place]. She is a Runner, broadcasting/film/video. The patient has no grandchildren. She is not a church attender    ADVANCED DIRECTIVES: The patient's daughter Yolanda Huffman is her healthcare power of attorney. Yolanda Huffman can be reached at [phone] or [phone]   HEALTH  MAINTENANCE: Social History   Tobacco Use  . Smoking status: Former Smoker    Packs/day: 2.00    Years: 20.00    Pack years: 40.00    Types: Cigarettes    Quit date: 11/26/1991    Years since quitting: 28.8  . Smokeless tobacco: Never Used  Vaping Use  . Vaping Use: Never used  Substance Use Topics  . Alcohol use: No  . Drug use: No     Colonoscopy:January 2017/ [facility]  PAP:  Bone density: March 2017 showed a T score of -2.7 (osteoporosis) right femoral neck   Allergies  Allergen Reactions  . Amlodipine Besylate Swelling    Current Outpatient Medications  Medication Sig Dispense Refill  . glycopyrrolate (ROBINUL) 1 MG tablet Take 1 tablet (1 mg total) by mouth every 4 (four) hours as needed (excessive secretions). 10 tablet 0  . haloperidol (HALDOL) 2 MG/ML solution Place 0.3 mLs (0.6 mg total) under the tongue every 4 (four) hours as needed for agitation (or delirium). 15 mL 0  . HYDROmorphone (DILAUDID) 4 MG tablet Take 1 tablet (4 mg total)  by mouth every 4 (four) hours as needed for severe pain. 10 tablet 0   No current facility-administered medications for this visit.    OBJECTIVE: White woman who appears stated age  There were no vitals filed for this visit. Wt Readings from Last 3 Encounters:  09/11/20 166 lb 0.1 oz (75.3 kg)  08/19/20 150 lb (68 kg)  07/10/20 147 lb 11.2 oz (67 kg)   There is no height or weight on file to calculate BMI.    ECOG FS:1 - Symptomatic but completely ambulatory  Ocular: Sclerae unicteric Ear-nose-throat: Wearing a mask Lymphatic: No cervical or supraclavicular adenopathy Lungs no rales or rhonchi Heart regular rate and rhythm Abd soft, nontender, positive bowel sounds MSK mild kyphosis but no focal spinal tenderness, no joint edema Neuro: non-focal, well-oriented, appropriate affect Breasts: the right breast is unremarkable.  The left breast is status post lumpectomy.  There is no evidence of local recurrence.  Both axillae  are benign.    LAB RESULTS:  CMP     Component Value Date/Time   NA 139 09/11/2020 1418   NA 137 05/28/2019 0000   NA 134 (L) 04/18/2017 0950   K 2.9 (L) 09/11/2020 1418   K 3.4 (L) 04/18/2017 0950   CL 103 09/11/2020 1418   CO2 22 09/11/2020 1410   CO2 26 04/18/2017 0950   GLUCOSE 177 (H) 09/11/2020 1418   GLUCOSE 139 04/18/2017 0950   BUN 59 (H) 09/11/2020 1418   BUN 50 (A) 05/28/2019 0000   BUN 53.9 (H) 04/18/2017 0950   CREATININE 3.20 (H) 09/11/2020 1418   CREATININE 3.04 (HH) 11/22/2018 1022   CREATININE 2.4 (H) 04/18/2017 0950   CALCIUM 9.7 09/11/2020 1410   CALCIUM 9.1 04/18/2017 0950   PROT 8.1 09/11/2020 1410   PROT 7.4 04/18/2017 0950   ALBUMIN 3.8 09/11/2020 1410   ALBUMIN 3.4 (L) 04/18/2017 0950   AST 74 (H) 09/11/2020 1410   AST 20 11/22/2018 1022   AST 28 04/18/2017 0950   ALT 61 (H) 09/11/2020 1410   ALT 11 11/22/2018 1022   ALT 18 04/18/2017 0950   ALKPHOS 47 09/11/2020 1410   ALKPHOS 42 04/18/2017 0950   BILITOT 1.1 09/11/2020 1410   BILITOT 0.4 11/22/2018 1022   BILITOT 0.55 04/18/2017 0950   GFRNONAA 14 (L) 09/11/2020 1410   GFRNONAA 14 (L) 11/22/2018 1022   GFRAA 16 (L) 01/08/2020 1102   GFRAA 16 (L) 11/22/2018 1022    INo results found for: SPEP, UPEP  Lab Results  Component Value Date   WBC 11.8 (H) 09/11/2020   NEUTROABS 10.5 (H) 09/11/2020   HGB 12.9 09/11/2020   HCT 38.0 09/11/2020   MCV 92.6 09/11/2020   PLT 217 09/11/2020      Chemistry      Component Value Date/Time   NA 139 09/11/2020 1418   NA 137 05/28/2019 0000   NA 134 (L) 04/18/2017 0950   K 2.9 (L) 09/11/2020 1418   K 3.4 (L) 04/18/2017 0950   CL 103 09/11/2020 1418   CO2 22 09/11/2020 1410   CO2 26 04/18/2017 0950   BUN 59 (H) 09/11/2020 1418   BUN 50 (A) 05/28/2019 0000   BUN 53.9 (H) 04/18/2017 0950   CREATININE 3.20 (H) 09/11/2020 1418   CREATININE 3.04 (HH) 11/22/2018 1022   CREATININE 2.4 (H) 04/18/2017 0950   GLU 124 05/28/2019 0000       Component Value Date/Time   CALCIUM 9.7 09/11/2020 1410   CALCIUM 9.1  04/18/2017 0950   ALKPHOS 47 09/11/2020 1410   ALKPHOS 42 04/18/2017 0950   AST 74 (H) 09/11/2020 1410   AST 20 11/22/2018 1022   AST 28 04/18/2017 0950   ALT 61 (H) 09/11/2020 1410   ALT 11 11/22/2018 1022   ALT 18 04/18/2017 0950   BILITOT 1.1 09/11/2020 1410   BILITOT 0.4 11/22/2018 1022   BILITOT 0.55 04/18/2017 0950       No results found for: LABCA2  No components found for: LABCA125  Recent Labs  Lab 09/11/20 1410  INR 1.2    Urinalysis    Component Value Date/Time   COLORURINE YELLOW 08/09/2018 2130   APPEARANCEUR CLEAR 08/09/2018 2130   LABSPEC 1.008 08/09/2018 2130   PHURINE 5.0 08/09/2018 2130   GLUCOSEU NEGATIVE 08/09/2018 2130   GLUCOSEU NEGATIVE 07/01/2011 1203   HGBUR SMALL (A) 08/09/2018 2130   BILIRUBINUR NEGATIVE 08/09/2018 2130   BILIRUBINUR neg 05/02/2013 1215   KETONESUR NEGATIVE 08/09/2018 2130   PROTEINUR NEGATIVE 08/09/2018 2130   UROBILINOGEN 0.2 05/02/2013 1215   UROBILINOGEN 0.2 07/01/2011 1203   NITRITE NEGATIVE 08/09/2018 2130   LEUKOCYTESUR SMALL (A) 08/09/2018 2130     STUDIES: CT HEAD CODE STROKE WO CONTRAST  Addendum Date: 09/11/2020   ADDENDUM REPORT: 09/11/2020 18:55 ADDENDUM: It has come to our attention that there is an unfortunate artifact related to this scanner which is manifested as low density appearance in the left posterior cerebral hemisphere and left cerebellar hemisphere. Clearly, the right MCA territory infarction is real. However, I think the low-density described in the left cerebral and cerebellar hemispheres probably relates to the described artifact. Electronically Signed   By: Nelson Chimes M.D.   On: 09/11/2020 18:55   Result Date: 09/11/2020 CLINICAL DATA:  Code stroke. EXAM: CT HEAD WITHOUT CONTRAST TECHNIQUE: Contiguous axial images were obtained from the base of the skull through the vertex without intravenous contrast. COMPARISON:   None. FINDINGS: Brain: Large area of hypoattenuation with loss of gray differentiation involving right frontal, parietal, and temporal lobes as well as the insula and basal ganglia. This involves MCA greater than ACA territories. There is also hypoattenuation and loss of gray differentiation left parietooccipital lobes. Hypoattenuation is present in the left cerebellar hemisphere. No acute intracranial hemorrhage. Mass effect is minor at this time. Small chronic left caudate infarct. Vascular: Hyperdensity along the right MCA. Skull: Unremarkable. Sinuses/Orbits: No acute finding. Other: Mastoid air cells are clear. IMPRESSION: Large right MCA territory infarction with some involvement the ACA territory. Moderate left PCA/MCA territory infarction. Large left PICA territory infarction. No acute intracranial hemorrhage. Hyperdense right carotid terminus and MCA. Electronically Signed: By: Macy Mis M.D. On: 09/11/2020 12:59     ELIGIBLE FOR AVAILABLE RESEARCH PROTOCOL: No  ASSESSMENT: 80 y.o. Frannie woman status post left breast upper inner quadrant biopsy 07/29/2016 for ductal carcinoma in situ, grade 2, estrogen and progesterone receptor positive  (1) Genetics testing 08/23/2016 through the Multi-Gene Panel offered by [facility] found no deleterious mutations in ALK, APC, ATM, AXIN2,BAP1,  BARD1, BLM, BMPR1A, BRCA1, BRCA2, BRIP1, CASR, CDC73, CDH1, CDK4, CDKN1B, CDKN1C, CDKN2A (p14ARF), CDKN2A (p16INK4a), CEBPA, CHEK2, DICER1, CIS3L2, EGFR (c.2369C>T, p.Thr790Met variant only), EPCAM (Deletion/duplication testing only), FH, FLCN, GATA2, GPC3, GREM1 (Promoter region deletion/duplication testing only), HOXB13 (c.251G>A, p.Gly84Glu), HRAS, KIT, MAX, MEN1, MET, MITF (c.952G>A, p.Glu318Lys variant only), MLH1, MSH2, MSH6, MUTYH, NBN, NF1, NF2, PALB2, PDGFRA, PHOX2B, PMS2, POLD1, POLE, POT1, PRKAR1A, PTCH1, PTEN, RAD50, RAD51C, RAD51D, RB1, RECQL4, RET, RUNX1, SDHAF2, SDHA (sequence changes  only), SDHB, SDHC,  SDHD, SMAD4, SMARCA4, SMARCB1, SMARCE1, STK11, SUFU, TERT, TERT, TMEM127, TP53, TSC1, TSC2, VHL, WRN and WT1  (2) considered the COMET trial: patient declines  (3) left lumpectomy 09/19/2016 found ductal carcinoma in situ, grade 2, measuring 0.6 cm, with negative margins.  (4) advised against adjuvant radiation since there would be no survival advantage in this setting and the patient's pacemaker would have to be moved  (5) anastrozole started 09/27/2016  (a) bone density 08/25/2015 found a T score of -2.7, consistent with osteoporosis   (b) denosumab/Prolia started 10/25/2016, repeated every 24 weeks  (c) repeat bone density 08/16/2018 shows a T score of -2.9  (4) iron deficiency anemia  (a) status post Feraheme 04/30/2018  (b) EGD 07/03/2018 showed candidal esophagitis  (b) Feraheme on 05/28/2019      PLAN: Larhonda is now a little over 3 years out from definitive surgery for her breast cancer with no evidence of disease recurrence.  This is favorable.  She is tolerating anastrozole: Well the plan is to continue that for a total of 5 years.  She will return to see [place] in 6 and 12 months with labs visits and Prolia at the same day  She knows to call for any other issue that may develop before the next visit  Total encounter time 25 minutes.Lurline Del, MD  09/16/20 8:54 AM Medical Oncology and Hematology [facility] [address] [place], [place] [address] Tel. [phone]    Fax. [phone]   I, Jacqualyn Posey am acting as a Education administrator for Chauncey Cruel, MD.   I, Lurline Del MD, have reviewed the above documentation for accuracy and completeness, and I agree with the above.   *Total Encounter Time as defined by the Centers for Medicare and Medicaid Services includes, in addition to the face-to-face time of a patient visit (documented in the note above) non-face-to-face time: obtaining and reviewing outside history, ordering and reviewing  medications, tests or procedures, care coordination (communications with other health care professionals or caregivers) and documentation in the medical record.

## 2020-09-22 ENCOUNTER — Ambulatory Visit: Payer: Medicare PPO

## 2020-09-25 NOTE — Telephone Encounter (Signed)
Can you see if we have any sympathy cards that I could send to family?

## 2020-09-25 NOTE — Telephone Encounter (Signed)
The patient has passed away per daughter Becky Sax. Expressed condolences. Advised I would forward to Dr. Lovena Le to let him know.

## 2020-09-25 NOTE — Telephone Encounter (Signed)
Levada Dy from Hospice is calling and wanted to let the provider know that patient passed away this morning at 9:46 am in the inpatient facility. CB is [phone]

## 2020-09-25 DEATH — deceased

## 2020-09-28 NOTE — Telephone Encounter (Signed)
The patient daughter states she has not been received the return kit. I ordered the patient a return kit. She should receive it 7-10 business days.

## 2020-10-20 ENCOUNTER — Telehealth: Payer: Medicare PPO | Admitting: Cardiology

## 2020-11-27 ENCOUNTER — Other Ambulatory Visit: Payer: Self-pay | Admitting: Internal Medicine

## 2020-11-27 ENCOUNTER — Other Ambulatory Visit: Payer: Self-pay | Admitting: Oncology

## 2021-01-07 ENCOUNTER — Other Ambulatory Visit: Payer: Medicare PPO

## 2021-01-07 ENCOUNTER — Ambulatory Visit: Payer: Medicare PPO | Admitting: Oncology

## 2021-01-07 ENCOUNTER — Ambulatory Visit: Payer: Medicare PPO

## 2021-01-11 ENCOUNTER — Ambulatory Visit: Payer: Medicare PPO

## 2021-01-11 ENCOUNTER — Other Ambulatory Visit: Payer: Medicare PPO

## 2021-01-11 ENCOUNTER — Ambulatory Visit: Payer: Medicare PPO | Admitting: Oncology

## 2021-02-16 ENCOUNTER — Ambulatory Visit: Payer: Medicare PPO | Admitting: Family Medicine

## 2021-10-22 IMAGING — MG DIGITAL DIAGNOSTIC BILAT W/ TOMO W/ CAD
6 of 9 series · 6 of 25 positions shown · non-contrast
Comparison: Previous exam(s).

ACR Breast Density Category a: The breast tissue is almost entirely
fatty.

CLINICAL DATA: 79-year-old female for annual follow-up. History of
LEFT breast cancer and lumpectomy in 2184.

EXAM:
DIGITAL DIAGNOSTIC BILATERAL MAMMOGRAM WITH CAD AND TOMO

[L CC]
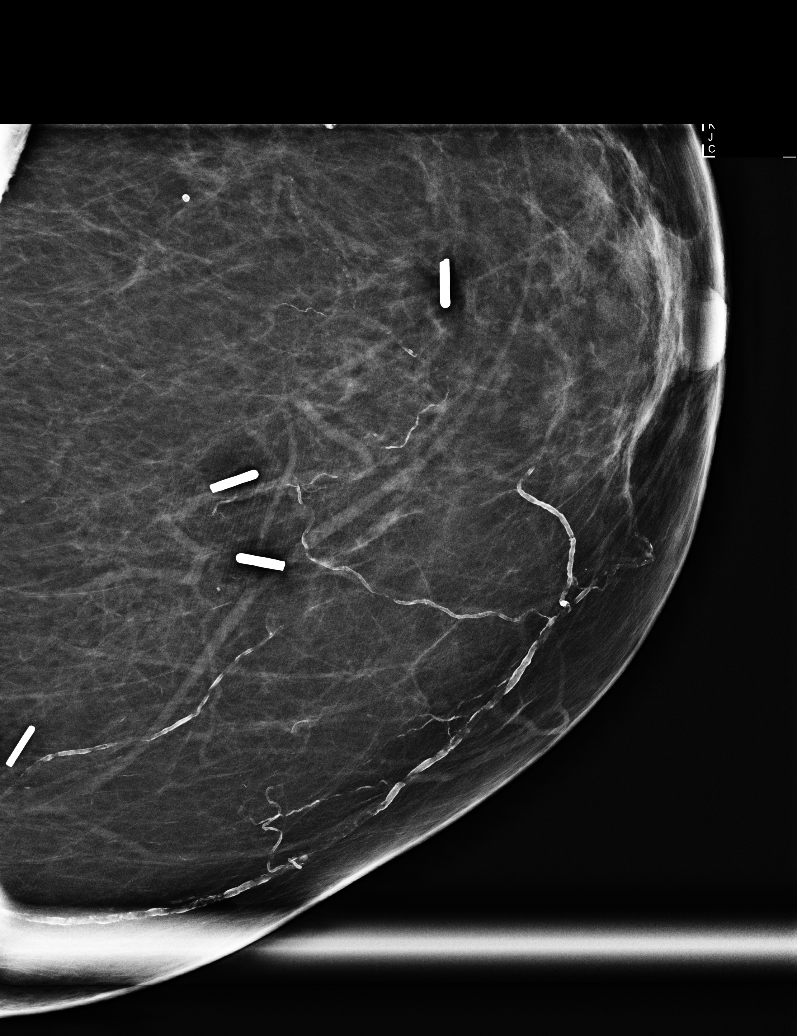

[L MLO synth-2D]
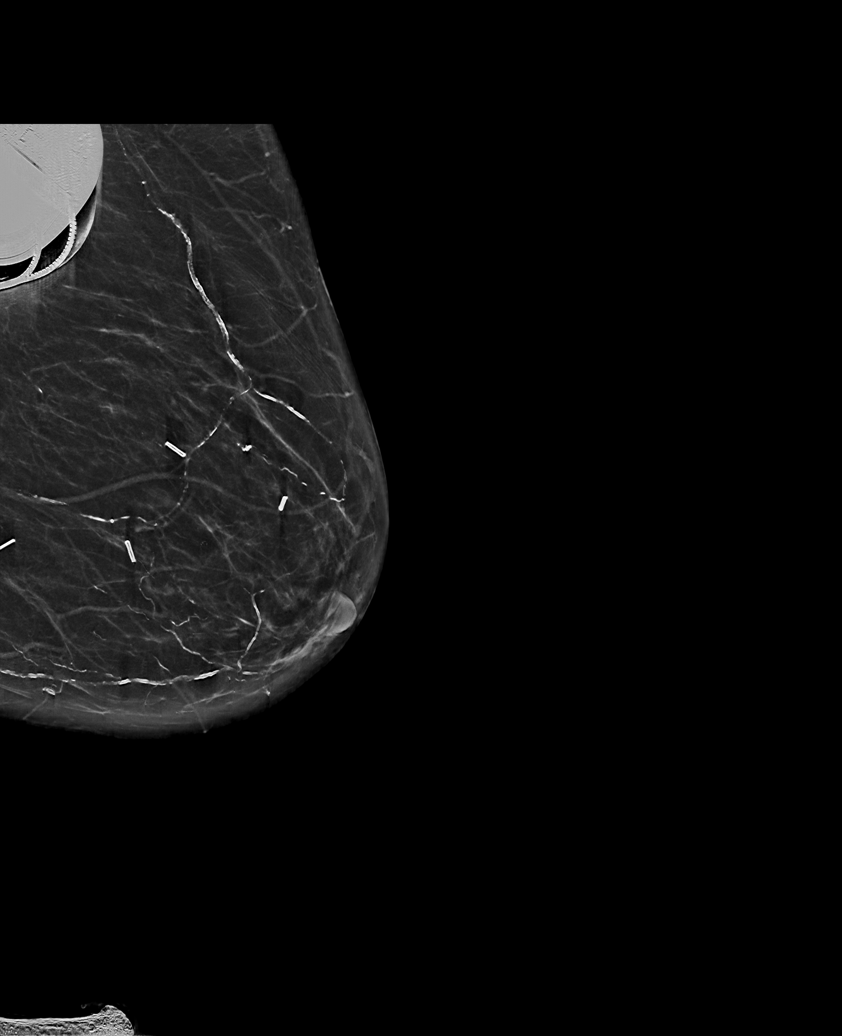

[R MLO synth-2D]
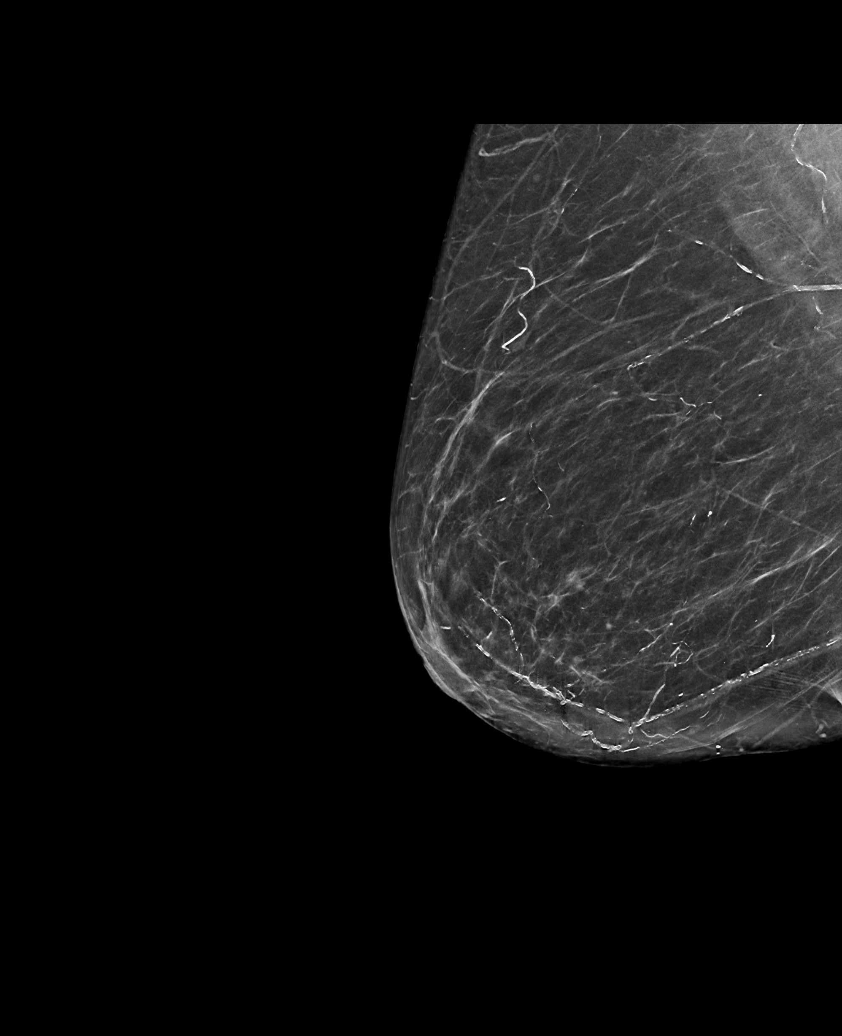

[L CC synth-2D]
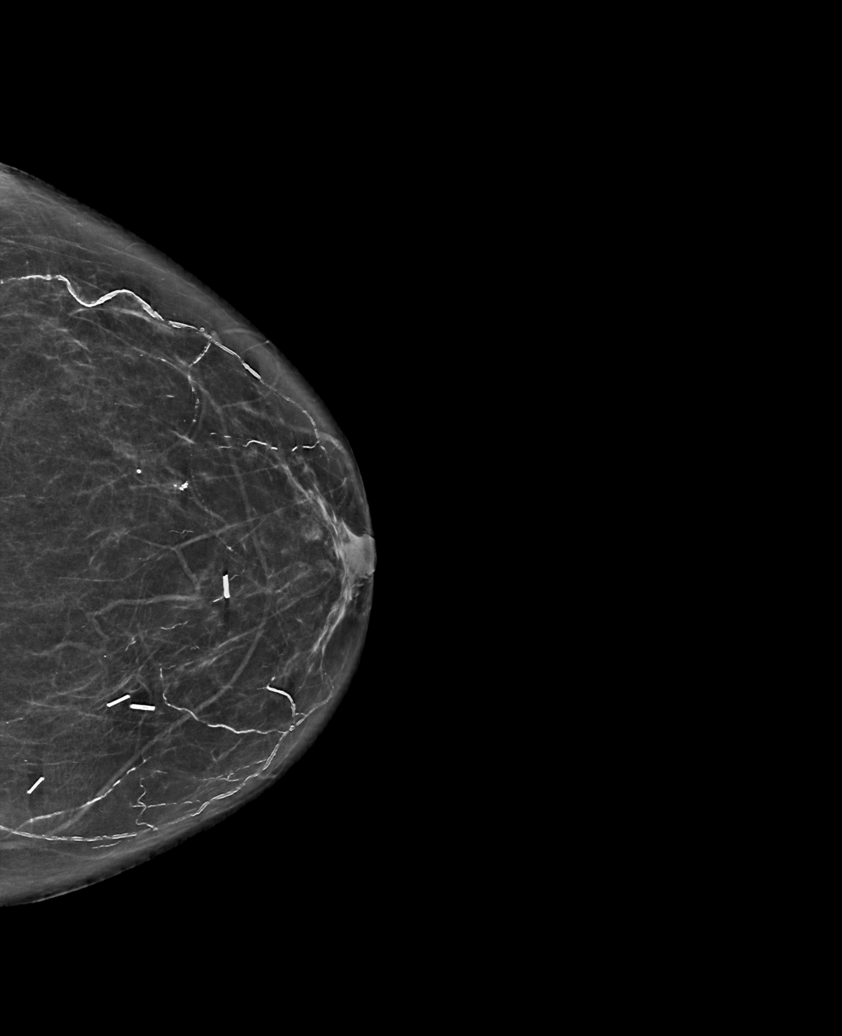

[R CC synth-2D]
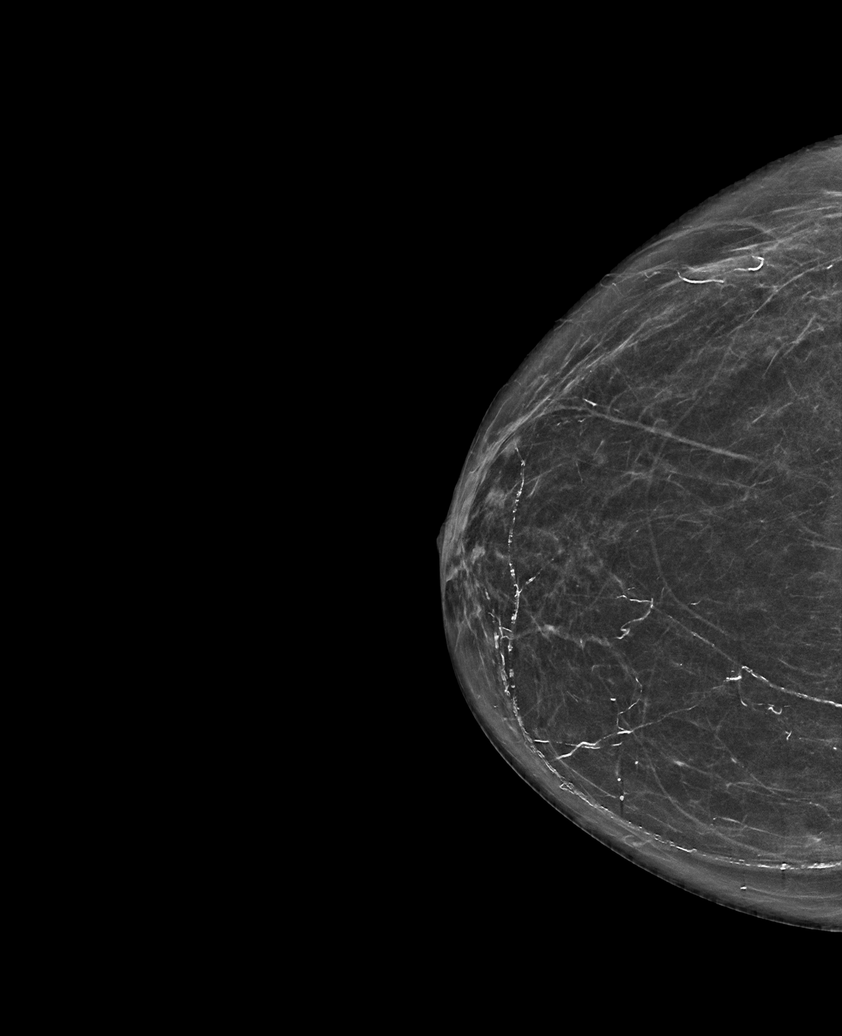

[L MLO tomo · tomo slice 33/66.0]
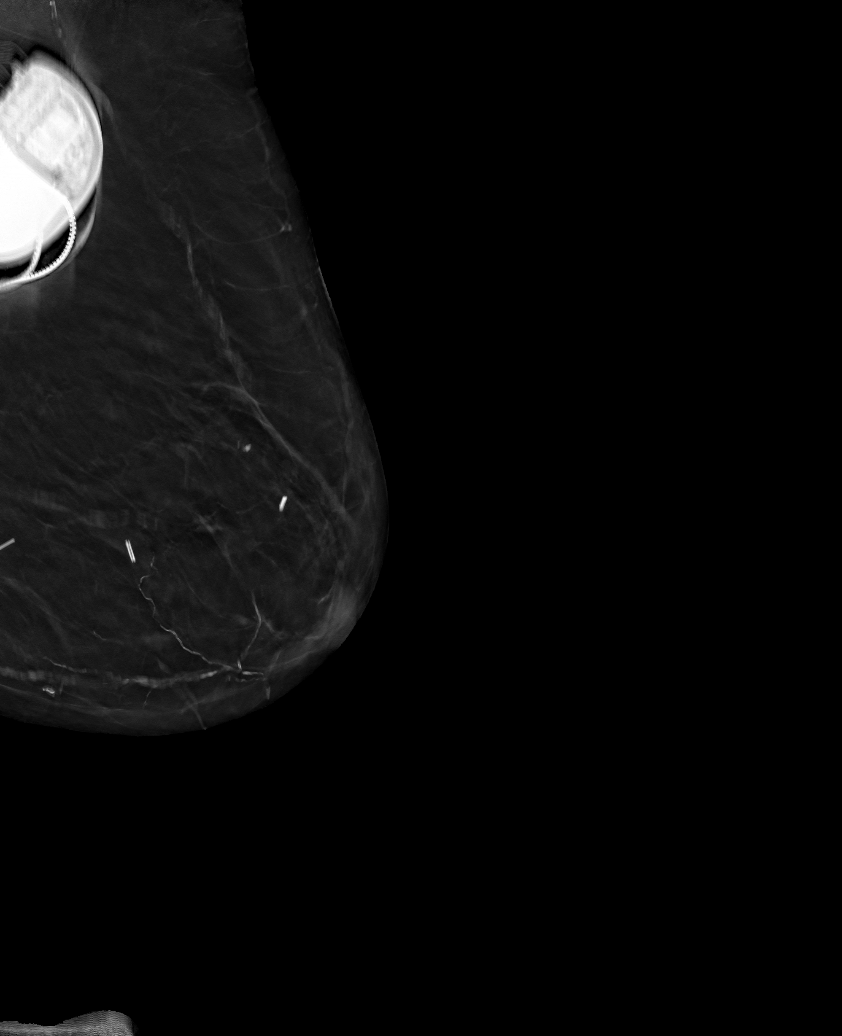

[6 of 25 positions shown; findings below may reference images not displayed]

FINDINGS: 2D and 3D full field views of both breasts and a magnification view
of the lumpectomy site demonstrate no suspicious mass, nonsurgical
distortion or worrisome calcifications.

LEFT lumpectomy changes again noted.

Mammographic images were processed with CAD.
IMPRESSION: No evidence of breast malignancy.

RECOMMENDATION:
Bilateral diagnostic mammogram in 1 year.

I have discussed the findings and recommendations with the patient.
If applicable, a reminder letter will be sent to the patient
regarding the next appointment.

BI-RADS CATEGORY  2: Benign.
